# Patient Record
Sex: Male | Born: 1935
Health system: Southern US, Community
[De-identification: ages and names within clinical notes are randomized; demographics above are authoritative.]

## PROBLEM LIST (undated history)

## (undated) DIAGNOSIS — R5383 Other fatigue: Secondary | ICD-10-CM

## (undated) DIAGNOSIS — T8859XA Other complications of anesthesia, initial encounter: Secondary | ICD-10-CM

## (undated) DIAGNOSIS — E041 Nontoxic single thyroid nodule: Secondary | ICD-10-CM

## (undated) DIAGNOSIS — R238 Other skin changes: Secondary | ICD-10-CM

## (undated) DIAGNOSIS — K219 Gastro-esophageal reflux disease without esophagitis: Secondary | ICD-10-CM

## (undated) DIAGNOSIS — K759 Inflammatory liver disease, unspecified: Secondary | ICD-10-CM

## (undated) DIAGNOSIS — I10 Essential (primary) hypertension: Secondary | ICD-10-CM

## (undated) DIAGNOSIS — IMO0001 Reserved for inherently not codable concepts without codable children: Secondary | ICD-10-CM

## (undated) DIAGNOSIS — C9110 Chronic lymphocytic leukemia of B-cell type not having achieved remission: Secondary | ICD-10-CM

## (undated) DIAGNOSIS — E785 Hyperlipidemia, unspecified: Secondary | ICD-10-CM

## (undated) DIAGNOSIS — M8580 Other specified disorders of bone density and structure, unspecified site: Secondary | ICD-10-CM

## (undated) DIAGNOSIS — J189 Pneumonia, unspecified organism: Secondary | ICD-10-CM

## (undated) DIAGNOSIS — G629 Polyneuropathy, unspecified: Secondary | ICD-10-CM

## (undated) DIAGNOSIS — I714 Abdominal aortic aneurysm, without rupture, unspecified: Secondary | ICD-10-CM

## (undated) DIAGNOSIS — M519 Unspecified thoracic, thoracolumbar and lumbosacral intervertebral disc disorder: Secondary | ICD-10-CM

## (undated) DIAGNOSIS — M1990 Unspecified osteoarthritis, unspecified site: Secondary | ICD-10-CM

## (undated) DIAGNOSIS — N529 Male erectile dysfunction, unspecified: Secondary | ICD-10-CM

## (undated) DIAGNOSIS — N400 Enlarged prostate without lower urinary tract symptoms: Secondary | ICD-10-CM

## (undated) DIAGNOSIS — R3129 Other microscopic hematuria: Secondary | ICD-10-CM

## (undated) DIAGNOSIS — I255 Ischemic cardiomyopathy: Secondary | ICD-10-CM

## (undated) DIAGNOSIS — I2510 Atherosclerotic heart disease of native coronary artery without angina pectoris: Secondary | ICD-10-CM

## (undated) DIAGNOSIS — I219 Acute myocardial infarction, unspecified: Secondary | ICD-10-CM

## (undated) DIAGNOSIS — M79606 Pain in leg, unspecified: Secondary | ICD-10-CM

## (undated) DIAGNOSIS — Z9981 Dependence on supplemental oxygen: Secondary | ICD-10-CM

## (undated) DIAGNOSIS — Z923 Personal history of irradiation: Secondary | ICD-10-CM

## (undated) DIAGNOSIS — M549 Dorsalgia, unspecified: Secondary | ICD-10-CM

## (undated) DIAGNOSIS — S129XXA Fracture of neck, unspecified, initial encounter: Secondary | ICD-10-CM

## (undated) DIAGNOSIS — H9190 Unspecified hearing loss, unspecified ear: Secondary | ICD-10-CM

## (undated) DIAGNOSIS — R233 Spontaneous ecchymoses: Secondary | ICD-10-CM

## (undated) DIAGNOSIS — C3490 Malignant neoplasm of unspecified part of unspecified bronchus or lung: Secondary | ICD-10-CM

## (undated) DIAGNOSIS — J449 Chronic obstructive pulmonary disease, unspecified: Secondary | ICD-10-CM

## (undated) DIAGNOSIS — T4145XA Adverse effect of unspecified anesthetic, initial encounter: Secondary | ICD-10-CM

## (undated) HISTORY — DX: Gastro-esophageal reflux disease without esophagitis: K21.9

## (undated) HISTORY — PX: CERVICAL DISC SURGERY: SHX588

## (undated) HISTORY — DX: Dorsalgia, unspecified: M54.9

## (undated) HISTORY — DX: Hyperlipidemia, unspecified: E78.5

## (undated) HISTORY — DX: Personal history of irradiation: Z92.3

## (undated) HISTORY — DX: Pain in leg, unspecified: M79.606

## (undated) HISTORY — DX: Benign prostatic hyperplasia without lower urinary tract symptoms: N40.0

## (undated) HISTORY — DX: Abdominal aortic aneurysm, without rupture: I71.4

## (undated) HISTORY — DX: Male erectile dysfunction, unspecified: N52.9

## (undated) HISTORY — DX: Other specified disorders of bone density and structure, unspecified site: M85.80

## (undated) HISTORY — DX: Abdominal aortic aneurysm, without rupture, unspecified: I71.40

## (undated) HISTORY — DX: Other microscopic hematuria: R31.29

## (undated) HISTORY — DX: Other fatigue: R53.83

## (undated) HISTORY — DX: Chronic lymphocytic leukemia of B-cell type not having achieved remission: C91.10

## (undated) HISTORY — DX: Acute myocardial infarction, unspecified: I21.9

## (undated) HISTORY — DX: Chronic obstructive pulmonary disease, unspecified: J44.9

## (undated) HISTORY — DX: Unspecified thoracic, thoracolumbar and lumbosacral intervertebral disc disorder: M51.9

---
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
## 1979-03-16 HISTORY — PX: OTHER SURGICAL HISTORY: SHX169

## 1988-04-15 DIAGNOSIS — S129XXA Fracture of neck, unspecified, initial encounter: Secondary | ICD-10-CM

## 1988-04-15 HISTORY — DX: Fracture of neck, unspecified, initial encounter: S12.9XXA

## 1997-11-01 ENCOUNTER — Ambulatory Visit (HOSPITAL_COMMUNITY): Admission: RE | Admit: 1997-11-01 | Discharge: 1997-11-01 | Payer: Self-pay | Admitting: Neurosurgery

## 1998-08-13 ENCOUNTER — Ambulatory Visit (HOSPITAL_COMMUNITY): Admission: RE | Admit: 1998-08-13 | Discharge: 1998-08-13 | Payer: Self-pay | Admitting: Neurosurgery

## 1998-08-13 ENCOUNTER — Emergency Department (HOSPITAL_COMMUNITY): Admission: EM | Admit: 1998-08-13 | Discharge: 1998-08-13 | Payer: Self-pay | Admitting: Emergency Medicine

## 1998-08-13 ENCOUNTER — Encounter: Payer: Self-pay | Admitting: Neurosurgery

## 1998-08-19 ENCOUNTER — Encounter: Payer: Self-pay | Admitting: Neurosurgery

## 1998-08-21 ENCOUNTER — Ambulatory Visit (HOSPITAL_COMMUNITY): Admission: RE | Admit: 1998-08-21 | Discharge: 1998-08-21 | Payer: Self-pay | Admitting: Neurosurgery

## 1998-09-11 ENCOUNTER — Encounter: Payer: Self-pay | Admitting: Neurosurgery

## 1998-09-11 ENCOUNTER — Observation Stay (HOSPITAL_COMMUNITY): Admission: RE | Admit: 1998-09-11 | Discharge: 1998-09-11 | Payer: Self-pay | Admitting: Neurosurgery

## 2003-03-28 ENCOUNTER — Ambulatory Visit (HOSPITAL_COMMUNITY): Admission: RE | Admit: 2003-03-28 | Discharge: 2003-03-28 | Payer: Self-pay | Admitting: Neurosurgery

## 2003-06-10 ENCOUNTER — Inpatient Hospital Stay (HOSPITAL_COMMUNITY): Admission: RE | Admit: 2003-06-10 | Discharge: 2003-06-10 | Payer: Self-pay | Admitting: Neurosurgery

## 2003-07-05 ENCOUNTER — Encounter: Admission: RE | Admit: 2003-07-05 | Discharge: 2003-07-05 | Payer: Self-pay | Admitting: Neurosurgery

## 2003-07-14 DIAGNOSIS — J449 Chronic obstructive pulmonary disease, unspecified: Secondary | ICD-10-CM

## 2003-07-14 HISTORY — DX: Chronic obstructive pulmonary disease, unspecified: J44.9

## 2003-07-25 ENCOUNTER — Ambulatory Visit (HOSPITAL_COMMUNITY): Admission: RE | Admit: 2003-07-25 | Discharge: 2003-07-25 | Payer: Self-pay | Admitting: Internal Medicine

## 2003-09-02 ENCOUNTER — Ambulatory Visit (HOSPITAL_COMMUNITY): Admission: RE | Admit: 2003-09-02 | Discharge: 2003-09-02 | Payer: Self-pay | Admitting: Gastroenterology

## 2003-09-02 ENCOUNTER — Encounter (INDEPENDENT_AMBULATORY_CARE_PROVIDER_SITE_OTHER): Payer: Self-pay | Admitting: Specialist

## 2003-11-23 ENCOUNTER — Inpatient Hospital Stay (HOSPITAL_COMMUNITY): Admission: EM | Admit: 2003-11-23 | Discharge: 2003-11-25 | Payer: Self-pay | Admitting: Emergency Medicine

## 2003-11-23 ENCOUNTER — Encounter (INDEPENDENT_AMBULATORY_CARE_PROVIDER_SITE_OTHER): Payer: Self-pay | Admitting: *Deleted

## 2003-11-23 HISTORY — PX: APPENDECTOMY: SHX54

## 2003-12-14 HISTORY — PX: NASAL SINUS SURGERY: SHX719

## 2005-05-26 ENCOUNTER — Encounter: Admission: RE | Admit: 2005-05-26 | Discharge: 2005-05-26 | Payer: Self-pay | Admitting: Internal Medicine

## 2006-06-03 ENCOUNTER — Encounter: Admission: RE | Admit: 2006-06-03 | Discharge: 2006-06-03 | Payer: Self-pay | Admitting: Internal Medicine

## 2006-08-02 ENCOUNTER — Ambulatory Visit (HOSPITAL_COMMUNITY): Admission: RE | Admit: 2006-08-02 | Discharge: 2006-08-02 | Payer: Self-pay | Admitting: Neurosurgery

## 2006-08-25 ENCOUNTER — Inpatient Hospital Stay (HOSPITAL_COMMUNITY): Admission: RE | Admit: 2006-08-25 | Discharge: 2006-08-29 | Payer: Self-pay | Admitting: Neurosurgery

## 2006-08-31 ENCOUNTER — Inpatient Hospital Stay (HOSPITAL_COMMUNITY): Admission: EM | Admit: 2006-08-31 | Discharge: 2006-09-03 | Payer: Self-pay | Admitting: Emergency Medicine

## 2006-09-01 ENCOUNTER — Ambulatory Visit: Payer: Self-pay | Admitting: Infectious Diseases

## 2007-02-01 ENCOUNTER — Encounter: Admission: RE | Admit: 2007-02-01 | Discharge: 2007-02-01 | Payer: Self-pay | Admitting: Otolaryngology

## 2007-03-16 HISTORY — PX: BACK SURGERY: SHX140

## 2007-06-07 ENCOUNTER — Encounter: Admission: RE | Admit: 2007-06-07 | Discharge: 2007-06-07 | Payer: Self-pay | Admitting: Internal Medicine

## 2007-11-29 ENCOUNTER — Encounter: Admission: RE | Admit: 2007-11-29 | Discharge: 2007-11-29 | Payer: Self-pay | Admitting: Neurosurgery

## 2008-01-05 ENCOUNTER — Ambulatory Visit (HOSPITAL_COMMUNITY): Admission: RE | Admit: 2008-01-05 | Discharge: 2008-01-05 | Payer: Self-pay | Admitting: Neurosurgery

## 2008-01-16 ENCOUNTER — Encounter (INDEPENDENT_AMBULATORY_CARE_PROVIDER_SITE_OTHER): Payer: Self-pay | Admitting: *Deleted

## 2008-01-16 ENCOUNTER — Ambulatory Visit: Payer: Self-pay | Admitting: Surgery

## 2008-01-16 ENCOUNTER — Observation Stay (HOSPITAL_COMMUNITY): Admission: EM | Admit: 2008-01-16 | Discharge: 2008-01-17 | Payer: Self-pay | Admitting: Emergency Medicine

## 2008-06-06 ENCOUNTER — Encounter: Admission: RE | Admit: 2008-06-06 | Discharge: 2008-06-06 | Payer: Self-pay | Admitting: Internal Medicine

## 2008-06-21 ENCOUNTER — Ambulatory Visit: Payer: Self-pay | Admitting: Oncology

## 2008-06-25 ENCOUNTER — Encounter: Admission: RE | Admit: 2008-06-25 | Discharge: 2008-06-25 | Payer: Self-pay | Admitting: Gastroenterology

## 2008-06-26 ENCOUNTER — Encounter: Admission: RE | Admit: 2008-06-26 | Discharge: 2008-06-26 | Payer: Self-pay | Admitting: Neurosurgery

## 2008-07-03 ENCOUNTER — Other Ambulatory Visit: Admission: RE | Admit: 2008-07-03 | Discharge: 2008-07-03 | Payer: Self-pay | Admitting: Oncology

## 2008-07-03 ENCOUNTER — Encounter: Payer: Self-pay | Admitting: Oncology

## 2008-07-03 LAB — CHCC SMEAR

## 2008-07-03 LAB — IGG, IGA, IGM
IgA: 234 mg/dL (ref 68–378)
IgG (Immunoglobin G), Serum: 923 mg/dL (ref 694–1618)
IgM, Serum: 134 mg/dL (ref 60–263)

## 2008-07-03 LAB — CBC WITH DIFFERENTIAL/PLATELET
BASO%: 0.3 % (ref 0.0–2.0)
Basophils Absolute: 0 10*3/uL (ref 0.0–0.1)
EOS%: 1.1 % (ref 0.0–7.0)
Eosinophils Absolute: 0.1 10*3/uL (ref 0.0–0.5)
HCT: 40.1 % (ref 38.4–49.9)
HGB: 13.6 g/dL (ref 13.0–17.1)
LYMPH%: 37.9 % (ref 14.0–49.0)
MCH: 31.6 pg (ref 27.2–33.4)
MCHC: 33.8 g/dL (ref 32.0–36.0)
MCV: 93.4 fL (ref 79.3–98.0)
MONO#: 0.6 10*3/uL (ref 0.1–0.9)
MONO%: 5.5 % (ref 0.0–14.0)
NEUT#: 6.5 10*3/uL (ref 1.5–6.5)
NEUT%: 55.2 % (ref 39.0–75.0)
Platelets: 259 10*3/uL (ref 140–400)
RBC: 4.3 10*6/uL (ref 4.20–5.82)
RDW: 13.7 % (ref 11.0–14.6)
WBC: 11.8 10*3/uL — ABNORMAL HIGH (ref 4.0–10.3)
lymph#: 4.5 10*3/uL — ABNORMAL HIGH (ref 0.9–3.3)

## 2008-07-03 LAB — VITAMIN B12: Vitamin B-12: 314 pg/mL (ref 211–911)

## 2008-07-03 LAB — LACTATE DEHYDROGENASE: LDH: 187 U/L (ref 94–250)

## 2008-07-12 LAB — FLOW CYTOMETRY

## 2008-07-29 ENCOUNTER — Encounter: Admission: RE | Admit: 2008-07-29 | Discharge: 2008-07-29 | Payer: Self-pay | Admitting: Neurological Surgery

## 2008-08-15 ENCOUNTER — Inpatient Hospital Stay (HOSPITAL_COMMUNITY): Admission: RE | Admit: 2008-08-15 | Discharge: 2008-08-20 | Payer: Self-pay | Admitting: Neurosurgery

## 2008-08-15 HISTORY — PX: OTHER SURGICAL HISTORY: SHX169

## 2008-09-03 ENCOUNTER — Inpatient Hospital Stay (HOSPITAL_COMMUNITY): Admission: AD | Admit: 2008-09-03 | Discharge: 2008-09-11 | Payer: Self-pay | Admitting: Internal Medicine

## 2008-09-03 ENCOUNTER — Ambulatory Visit: Payer: Self-pay | Admitting: Pulmonary Disease

## 2008-10-09 ENCOUNTER — Encounter: Admission: RE | Admit: 2008-10-09 | Discharge: 2008-10-09 | Payer: Self-pay | Admitting: Internal Medicine

## 2008-10-13 ENCOUNTER — Encounter: Admission: RE | Admit: 2008-10-13 | Discharge: 2008-10-13 | Payer: Self-pay | Admitting: Internal Medicine

## 2008-11-27 ENCOUNTER — Encounter: Admission: RE | Admit: 2008-11-27 | Discharge: 2008-11-27 | Payer: Self-pay | Admitting: Neurosurgery

## 2008-12-27 ENCOUNTER — Ambulatory Visit: Payer: Self-pay | Admitting: Oncology

## 2008-12-31 ENCOUNTER — Ambulatory Visit: Admission: RE | Admit: 2008-12-31 | Discharge: 2008-12-31 | Payer: Self-pay | Admitting: Oncology

## 2008-12-31 ENCOUNTER — Ambulatory Visit: Payer: Self-pay | Admitting: Vascular Surgery

## 2008-12-31 ENCOUNTER — Encounter: Payer: Self-pay | Admitting: Oncology

## 2008-12-31 LAB — CBC WITH DIFFERENTIAL/PLATELET
BASO%: 0.4 % (ref 0.0–2.0)
Basophils Absolute: 0 10*3/uL (ref 0.0–0.1)
EOS%: 1.7 % (ref 0.0–7.0)
Eosinophils Absolute: 0.1 10*3/uL (ref 0.0–0.5)
HCT: 42.5 % (ref 38.4–49.9)
HGB: 14.4 g/dL (ref 13.0–17.1)
LYMPH%: 34.6 % (ref 14.0–49.0)
MCH: 30.6 pg (ref 27.2–33.4)
MCHC: 33.9 g/dL (ref 32.0–36.0)
MCV: 90.2 fL (ref 79.3–98.0)
MONO#: 0.5 10*3/uL (ref 0.1–0.9)
MONO%: 5.9 % (ref 0.0–14.0)
NEUT#: 4.7 10*3/uL (ref 1.5–6.5)
NEUT%: 57.4 % (ref 39.0–75.0)
Platelets: 161 10*3/uL (ref 140–400)
RBC: 4.71 10*6/uL (ref 4.20–5.82)
RDW: 13.2 % (ref 11.0–14.6)
WBC: 8.1 10*3/uL (ref 4.0–10.3)
lymph#: 2.8 10*3/uL (ref 0.9–3.3)

## 2009-01-24 ENCOUNTER — Encounter: Admission: RE | Admit: 2009-01-24 | Discharge: 2009-01-24 | Payer: Self-pay | Admitting: Neurosurgery

## 2009-01-30 ENCOUNTER — Encounter: Admission: RE | Admit: 2009-01-30 | Discharge: 2009-01-30 | Payer: Self-pay | Admitting: Neurosurgery

## 2009-03-12 ENCOUNTER — Inpatient Hospital Stay (HOSPITAL_COMMUNITY): Admission: AD | Admit: 2009-03-12 | Discharge: 2009-03-18 | Payer: Self-pay | Admitting: Geriatric Medicine

## 2009-03-12 ENCOUNTER — Encounter: Admission: RE | Admit: 2009-03-12 | Discharge: 2009-03-12 | Payer: Self-pay | Admitting: Internal Medicine

## 2009-03-12 ENCOUNTER — Ambulatory Visit: Payer: Self-pay | Admitting: Internal Medicine

## 2009-03-15 HISTORY — PX: OTHER SURGICAL HISTORY: SHX169

## 2009-03-17 DIAGNOSIS — I219 Acute myocardial infarction, unspecified: Secondary | ICD-10-CM

## 2009-03-17 HISTORY — DX: Acute myocardial infarction, unspecified: I21.9

## 2009-09-12 HISTORY — PX: EYE SURGERY: SHX253

## 2009-09-26 ENCOUNTER — Ambulatory Visit: Payer: Self-pay | Admitting: Oncology

## 2009-09-30 LAB — CBC WITH DIFFERENTIAL/PLATELET
BASO%: 0.4 % (ref 0.0–2.0)
Basophils Absolute: 0 10*3/uL (ref 0.0–0.1)
EOS%: 1.5 % (ref 0.0–7.0)
Eosinophils Absolute: 0.1 10*3/uL (ref 0.0–0.5)
HCT: 41 % (ref 38.4–49.9)
HGB: 14.1 g/dL (ref 13.0–17.1)
LYMPH%: 33.7 % (ref 14.0–49.0)
MCH: 31.8 pg (ref 27.2–33.4)
MCHC: 34.3 g/dL (ref 32.0–36.0)
MCV: 92.7 fL (ref 79.3–98.0)
MONO#: 0.5 10*3/uL (ref 0.1–0.9)
MONO%: 5.8 % (ref 0.0–14.0)
NEUT#: 5.2 10*3/uL (ref 1.5–6.5)
NEUT%: 58.6 % (ref 39.0–75.0)
Platelets: 193 10*3/uL (ref 140–400)
RBC: 4.43 10*6/uL (ref 4.20–5.82)
RDW: 13 % (ref 11.0–14.6)
WBC: 8.9 10*3/uL (ref 4.0–10.3)
lymph#: 3 10*3/uL (ref 0.9–3.3)

## 2009-09-30 LAB — IGG, IGA, IGM
IgA: 231 mg/dL (ref 68–378)
IgG (Immunoglobin G), Serum: 1300 mg/dL (ref 694–1618)
IgM, Serum: 180 mg/dL (ref 60–263)

## 2009-10-20 ENCOUNTER — Telehealth (INDEPENDENT_AMBULATORY_CARE_PROVIDER_SITE_OTHER): Payer: Self-pay | Admitting: *Deleted

## 2009-10-21 ENCOUNTER — Encounter: Payer: Self-pay | Admitting: Cardiology

## 2009-10-21 ENCOUNTER — Ambulatory Visit: Payer: Self-pay

## 2009-10-21 ENCOUNTER — Encounter (HOSPITAL_COMMUNITY): Admission: RE | Admit: 2009-10-21 | Discharge: 2009-12-02 | Payer: Self-pay | Admitting: Cardiology

## 2009-10-21 ENCOUNTER — Ambulatory Visit: Payer: Self-pay | Admitting: Cardiology

## 2010-02-12 HISTORY — PX: OTHER SURGICAL HISTORY: SHX169

## 2010-03-30 ENCOUNTER — Ambulatory Visit: Payer: Self-pay | Admitting: Cardiology

## 2010-04-06 ENCOUNTER — Encounter: Payer: Self-pay | Admitting: Internal Medicine

## 2010-04-14 NOTE — Progress Notes (Signed)
Summary: Nuclear Pre-Procedure  Phone Note Outgoing Call Call back at [facility] Phone [phone]   Call placed by: Stanton Kidney, EMT-P,  October 20, 2009 3:08 PM Call placed to: Patient Action Taken: Phone Call Completed Summary of Call: Reviewed information on Myoview Information Sheet (see scanned document for further details).  Spoke with the Patient.    Nuclear Med Background Indications for Stress Test: Evaluation for Ischemia, Surgical Clearance  Indications Comments: Pending possible spinal surgery- Dr. Ethelene Hal  History: Angioplasty, COPD, Heart Catheterization, Myocardial Infarction, Myocardial Perfusion Study, Stents  History Comments: 11/09 MPS: EF=50% 12/10 MI 03/17/09 Heart Cath > Angioplasty > Stents: x2 RCA  Symptoms: DOE, Fatigue    Nuclear Pre-Procedure Cardiac Risk Factors: History of Smoking

## 2010-04-14 NOTE — Assessment & Plan Note (Signed)
Summary: Cardiology Nuclear Testing  Nuclear Med Background Indications for Stress Test: Evaluation for Ischemia, Surgical Clearance, Stent Patency, PTCA Patency  Indications Comments: Pending possible spinal surgery- Dr. Ethelene Hal  History: Angioplasty, COPD, Heart Catheterization, Myocardial Infarction, Myocardial Perfusion Study, Stents  History Comments: 11/09 MPS: EF=50% 12/10 MI 03/17/09 Heart Cath > Angioplasty > Stents: x2 RCA  Symptoms: DOE, Fatigue, Fatigue with Exertion    Nuclear Pre-Procedure Cardiac Risk Factors: History of Smoking, Lipids Caffeine/Decaff Intake: 8:00PM NPO After: 8:00 PM Lungs: Clear IV 0.9% NS with Angio Cath: 22g     IV Site: (R) Hand IV Started by: Irean Hong RN Chest Size (in) 46     Height (in): 71 Weight (lb): 196 BMI: 27.44 Tech Comments: Held metoprolol 15 hrs.  Nuclear Med Study 1 or 2 day study:  1 day     Stress Test Type:  Lexiscan Low Level Reading MD:  Willa Rough, MD     Referring MD:  Delfin Edis Resting Radionuclide:  Technetium 68m Tetrofosmin     Resting Radionuclide Dose:  11.0 mCi  Stress Radionuclide:  Technetium 38m Tetrofosmin     Stress Radionuclide Dose:  32.6 mCi   Stress Protocol      Max HR:  101 bpm Max Systolic BP: 139 mm HgRate Pressure Product:  73220  Lexiscan: 0.4 mg   Stress Test Technologist:  Irean Hong RN     Nuclear Technologist:  Harlow Asa CNMT  Rest Procedure  Myocardial perfusion imaging was performed at rest 45 minutes following the intravenous administration of Myoview Technetium 59m Tetrofosmin.  Stress Procedure  The patient received IV Lexiscan 0.4 mg over 15-seconds with concurrent low level exercise, and then myoview was  injected at 30-seconds while the patient continued walking one more minute. The patient had baseline nonspecific T wave changes.  There were no significant changes with lexiscan, rare PVC.  Quantitative spect images were obtained after a 45 minute  delay.  QPS Raw Data Images:  Patient motion noted; appropriate software correction applied. Stress Images:  There is normal uptake in all areas. Rest Images:  Normal homogeneous uptake in all areas of the myocardium. Subtraction (SDS):  No evidence of ischemia. Transient Ischemic Dilatation:  .95  (Normal <1.22)  Lung/Heart Ratio:  0.32  (Normal <0.45)  Quantitative Gated Spect Images QGS EDV:  79 ml QGS ESV:  33 ml QGS EF:  58 % QGS cine images:  Mild septal dyssynergy  Findings Normal nuclear study      Overall Impression  Exercise Capacity: Lexiscan BP Response: Normal blood pressure response. Clinical Symptoms: SOB ECG Impression: No significant ST segment change suggestive of ischemia. Overall Impression: Normal stress nuclear study.

## 2010-05-31 LAB — BASIC METABOLIC PANEL
BUN: 13 mg/dL (ref 6–23)
BUN: 14 mg/dL (ref 6–23)
CO2: 26 mEq/L (ref 19–32)
CO2: 29 mEq/L (ref 19–32)
Calcium: 8.8 mg/dL (ref 8.4–10.5)
Calcium: 8.8 mg/dL (ref 8.4–10.5)
Chloride: 104 mEq/L (ref 96–112)
Chloride: 106 mEq/L (ref 96–112)
Creatinine, Ser: 0.91 mg/dL (ref 0.4–1.5)
Creatinine, Ser: 0.99 mg/dL (ref 0.4–1.5)
GFR calc Af Amer: >60 mL/min (ref >60)
GFR calc Af Amer: >60 mL/min (ref >60)
GFR calc non Af Amer: >60 mL/min (ref >60)
GFR calc non Af Amer: >60 mL/min (ref >60)
Glucose, Bld: 100 mg/dL — ABNORMAL HIGH (ref 70–99)
Glucose, Bld: 114 mg/dL — ABNORMAL HIGH (ref 70–99)
Potassium: 4.2 mEq/L (ref 3.5–5.1)
Potassium: 4.6 mEq/L (ref 3.5–5.1)
Sodium: 137 mEq/L (ref 135–145)
Sodium: 141 mEq/L (ref 135–145)

## 2010-05-31 LAB — CBC
HCT: 38.7 % — ABNORMAL LOW (ref 39.0–52.0)
HCT: 42 % (ref 39.0–52.0)
Hemoglobin: 13.5 g/dL (ref 13.0–17.0)
Hemoglobin: 14.3 g/dL (ref 13.0–17.0)
MCHC: 34.1 g/dL (ref 30.0–36.0)
MCHC: 35 g/dL (ref 30.0–36.0)
MCV: 91.1 fL (ref 78.0–100.0)
MCV: 92.5 fL (ref 78.0–100.0)
Platelets: 153 10*3/uL (ref 150–400)
Platelets: 155 10*3/uL (ref 150–400)
RBC: 4.25 MIL/uL (ref 4.22–5.81)
RBC: 4.54 MIL/uL (ref 4.22–5.81)
RDW: 13.5 % (ref 11.5–15.5)
RDW: 13.9 % (ref 11.5–15.5)
WBC: 7.8 10*3/uL (ref 4.0–10.5)
WBC: 8.1 10*3/uL (ref 4.0–10.5)

## 2010-05-31 LAB — APTT: aPTT: 43 seconds — ABNORMAL HIGH (ref 24–37)

## 2010-05-31 LAB — PROTIME-INR
INR: 1.03 (ref 0.00–1.49)
Prothrombin Time: 13.4 seconds (ref 11.6–15.2)

## 2010-06-12 ENCOUNTER — Encounter: Payer: Self-pay | Admitting: Cardiology

## 2010-06-15 LAB — BASIC METABOLIC PANEL
BUN: 18 mg/dL (ref 6–23)
CO2: 26 mEq/L (ref 19–32)
Calcium: 8.7 mg/dL (ref 8.4–10.5)
Chloride: 108 mEq/L (ref 96–112)
Creatinine, Ser: 0.93 mg/dL (ref 0.4–1.5)
GFR calc Af Amer: >60 mL/min (ref >60)
GFR calc non Af Amer: >60 mL/min (ref >60)
Glucose, Bld: 94 mg/dL (ref 70–99)
Potassium: 5.1 mEq/L (ref 3.5–5.1)
Sodium: 141 mEq/L (ref 135–145)

## 2010-06-15 LAB — COMPREHENSIVE METABOLIC PANEL
ALT: 22 U/L (ref 0–53)
AST: 26 U/L (ref 0–37)
Albumin: 3.8 g/dL (ref 3.5–5.2)
Alkaline Phosphatase: 65 U/L (ref 39–117)
BUN: 19 mg/dL (ref 6–23)
CO2: 24 mEq/L (ref 19–32)
Calcium: 9 mg/dL (ref 8.4–10.5)
Chloride: 103 mEq/L (ref 96–112)
Creatinine, Ser: 0.98 mg/dL (ref 0.4–1.5)
GFR calc Af Amer: >60 mL/min (ref >60)
GFR calc non Af Amer: >60 mL/min (ref >60)
Glucose, Bld: 150 mg/dL — ABNORMAL HIGH (ref 70–99)
Potassium: 3.3 mEq/L — ABNORMAL LOW (ref 3.5–5.1)
Sodium: 139 mEq/L (ref 135–145)
Total Bilirubin: 0.7 mg/dL (ref 0.3–1.2)
Total Protein: 6.7 g/dL (ref 6.0–8.3)

## 2010-06-15 LAB — CBC
HCT: 43.2 % (ref 39.0–52.0)
Hemoglobin: 14.9 g/dL (ref 13.0–17.0)
MCHC: 34.5 g/dL (ref 30.0–36.0)
MCV: 92.1 fL (ref 78.0–100.0)
Platelets: 227 10*3/uL (ref 150–400)
RBC: 4.69 MIL/uL (ref 4.22–5.81)
RDW: 13.8 % (ref 11.5–15.5)
WBC: 12.1 10*3/uL — ABNORMAL HIGH (ref 4.0–10.5)

## 2010-06-15 LAB — CARDIAC PANEL(CRET KIN+CKTOT+MB+TROPI)
CK, MB: 2.5 ng/mL (ref 0.3–4.0)
CK, MB: 3.4 ng/mL (ref 0.3–4.0)
CK, MB: 6.3 ng/mL — ABNORMAL HIGH (ref 0.3–4.0)
Relative Index: 3.4 — ABNORMAL HIGH (ref 0.0–2.5)
Relative Index: INVALID (ref 0.0–2.5)
Relative Index: INVALID (ref 0.0–2.5)
Total CK: 100 U/L (ref 7–232)
Total CK: 52 U/L (ref 7–232)
Total CK: 90 U/L (ref 7–232)
Troponin I: 0.29 ng/mL — ABNORMAL HIGH (ref 0.00–0.06)
Troponin I: 0.3 ng/mL — ABNORMAL HIGH (ref 0.00–0.06)
Troponin I: 0.48 ng/mL — ABNORMAL HIGH (ref 0.00–0.06)

## 2010-06-15 LAB — APTT: aPTT: 28 seconds (ref 24–37)

## 2010-06-15 LAB — PROTIME-INR
INR: 1.05 (ref 0.00–1.49)
Prothrombin Time: 13.6 seconds (ref 11.6–15.2)

## 2010-06-22 LAB — CULTURE, RESPIRATORY W GRAM STAIN: Gram Stain: NONE SEEN

## 2010-06-22 LAB — BLOOD GAS, ARTERIAL
Acid-Base Excess: 2.7 mmol/L — ABNORMAL HIGH (ref 0.0–2.0)
Acid-Base Excess: 3.7 mmol/L — ABNORMAL HIGH (ref 0.0–2.0)
Bicarbonate: 25.7 mEq/L — ABNORMAL HIGH (ref 20.0–24.0)
Bicarbonate: 27.6 mEq/L — ABNORMAL HIGH (ref 20.0–24.0)
Drawn by: 270091
Drawn by: 277331
FIO2: 0.5 %
O2 Content: 4 L/min
O2 Saturation: 91.7 %
O2 Saturation: 99.8 %
Patient temperature: 98.6
Patient temperature: 98.6
TCO2: 26.7 mmol/L (ref 0–100)
TCO2: 28.8 mmol/L (ref 0–100)
pCO2 arterial: 32.8 mmHg — ABNORMAL LOW (ref 35.0–45.0)
pCO2 arterial: 40.7 mmHg (ref 35.0–45.0)
pH, Arterial: 7.445 (ref 7.350–7.450)
pH, Arterial: 7.506 — ABNORMAL HIGH (ref 7.350–7.450)
pO2, Arterial: 164 mmHg — ABNORMAL HIGH (ref 80.0–100.0)
pO2, Arterial: 61.2 mmHg — ABNORMAL LOW (ref 80.0–100.0)

## 2010-06-22 LAB — COMPREHENSIVE METABOLIC PANEL
ALT: 20 U/L (ref 0–53)
ALT: 40 U/L (ref 0–53)
AST: 19 U/L (ref 0–37)
AST: 33 U/L (ref 0–37)
Albumin: 2.4 g/dL — ABNORMAL LOW (ref 3.5–5.2)
Albumin: 2.4 g/dL — ABNORMAL LOW (ref 3.5–5.2)
Alkaline Phosphatase: 71 U/L (ref 39–117)
Alkaline Phosphatase: 85 U/L (ref 39–117)
BUN: 12 mg/dL (ref 6–23)
BUN: 9 mg/dL (ref 6–23)
CO2: 27 mEq/L (ref 19–32)
CO2: 32 mEq/L (ref 19–32)
Calcium: 8.7 mg/dL (ref 8.4–10.5)
Calcium: 9.1 mg/dL (ref 8.4–10.5)
Chloride: 103 mEq/L (ref 96–112)
Chloride: 103 mEq/L (ref 96–112)
Creatinine, Ser: 0.74 mg/dL (ref 0.4–1.5)
Creatinine, Ser: 0.85 mg/dL (ref 0.4–1.5)
GFR calc Af Amer: >60 mL/min (ref >60)
GFR calc Af Amer: >60 mL/min (ref >60)
GFR calc non Af Amer: >60 mL/min (ref >60)
GFR calc non Af Amer: >60 mL/min (ref >60)
Glucose, Bld: 131 mg/dL — ABNORMAL HIGH (ref 70–99)
Glucose, Bld: 91 mg/dL (ref 70–99)
Potassium: 3.7 mEq/L (ref 3.5–5.1)
Potassium: 4 mEq/L (ref 3.5–5.1)
Sodium: 139 mEq/L (ref 135–145)
Sodium: 142 mEq/L (ref 135–145)
Total Bilirubin: 0.3 mg/dL (ref 0.3–1.2)
Total Bilirubin: 0.6 mg/dL (ref 0.3–1.2)
Total Protein: 6.2 g/dL (ref 6.0–8.3)
Total Protein: 6.5 g/dL (ref 6.0–8.3)

## 2010-06-22 LAB — CBC
HCT: 35.6 % — ABNORMAL LOW (ref 39.0–52.0)
HCT: 36.5 % — ABNORMAL LOW (ref 39.0–52.0)
HCT: 36.9 % — ABNORMAL LOW (ref 39.0–52.0)
HCT: 38 % — ABNORMAL LOW (ref 39.0–52.0)
HCT: 43 % (ref 39.0–52.0)
HCT: 45.2 % (ref 39.0–52.0)
Hemoglobin: 12.2 g/dL — ABNORMAL LOW (ref 13.0–17.0)
Hemoglobin: 12.2 g/dL — ABNORMAL LOW (ref 13.0–17.0)
Hemoglobin: 12.4 g/dL — ABNORMAL LOW (ref 13.0–17.0)
Hemoglobin: 12.7 g/dL — ABNORMAL LOW (ref 13.0–17.0)
Hemoglobin: 14.3 g/dL (ref 13.0–17.0)
Hemoglobin: 15.1 g/dL (ref 13.0–17.0)
MCHC: 33.3 g/dL (ref 30.0–36.0)
MCHC: 33.3 g/dL (ref 30.0–36.0)
MCHC: 33.5 g/dL (ref 30.0–36.0)
MCHC: 33.5 g/dL (ref 30.0–36.0)
MCHC: 34.2 g/dL (ref 30.0–36.0)
MCHC: 33.4 g/dL (ref 30.0–36.0)
MCV: 92.9 fL (ref 78.0–100.0)
MCV: 93.2 fL (ref 78.0–100.0)
MCV: 94 fL (ref 78.0–100.0)
MCV: 94.1 fL (ref 78.0–100.0)
MCV: 94.2 fL (ref 78.0–100.0)
MCV: 96.1 fL (ref 78.0–100.0)
Platelets: 382 10*3/uL (ref 150–400)
Platelets: 434 10*3/uL — ABNORMAL HIGH (ref 150–400)
Platelets: 434 10*3/uL — ABNORMAL HIGH (ref 150–400)
Platelets: 437 10*3/uL — ABNORMAL HIGH (ref 150–400)
Platelets: 443 10*3/uL — ABNORMAL HIGH (ref 150–400)
Platelets: 239 10*3/uL (ref 150–400)
RBC: 3.83 MIL/uL — ABNORMAL LOW (ref 4.22–5.81)
RBC: 3.89 MIL/uL — ABNORMAL LOW (ref 4.22–5.81)
RBC: 3.96 MIL/uL — ABNORMAL LOW (ref 4.22–5.81)
RBC: 4.03 MIL/uL — ABNORMAL LOW (ref 4.22–5.81)
RBC: 4.57 MIL/uL (ref 4.22–5.81)
RBC: 4.7 MIL/uL (ref 4.22–5.81)
RDW: 12.6 % (ref 11.5–15.5)
RDW: 12.7 % (ref 11.5–15.5)
RDW: 12.8 % (ref 11.5–15.5)
RDW: 12.9 % (ref 11.5–15.5)
RDW: 13.3 % (ref 11.5–15.5)
RDW: 13.6 % (ref 11.5–15.5)
WBC: 13 10*3/uL — ABNORMAL HIGH (ref 4.0–10.5)
WBC: 13.2 10*3/uL — ABNORMAL HIGH (ref 4.0–10.5)
WBC: 21.2 10*3/uL — ABNORMAL HIGH (ref 4.0–10.5)
WBC: 22.5 10*3/uL — ABNORMAL HIGH (ref 4.0–10.5)
WBC: 23.5 10*3/uL — ABNORMAL HIGH (ref 4.0–10.5)
WBC: 12.3 10*3/uL — ABNORMAL HIGH (ref 4.0–10.5)

## 2010-06-22 LAB — BASIC METABOLIC PANEL
BUN: 19 mg/dL (ref 6–23)
BUN: 6 mg/dL (ref 6–23)
BUN: 12 mg/dL (ref 6–23)
CO2: 25 mEq/L (ref 19–32)
CO2: 29 mEq/L (ref 19–32)
CO2: 30 mEq/L (ref 19–32)
Calcium: 8.8 mg/dL (ref 8.4–10.5)
Calcium: 8.9 mg/dL (ref 8.4–10.5)
Calcium: 8.9 mg/dL (ref 8.4–10.5)
Chloride: 102 mEq/L (ref 96–112)
Chloride: 98 mEq/L (ref 96–112)
Chloride: 102 mEq/L (ref 96–112)
Creatinine, Ser: 0.78 mg/dL (ref 0.4–1.5)
Creatinine, Ser: 0.83 mg/dL (ref 0.4–1.5)
Creatinine, Ser: 0.9 mg/dL (ref 0.4–1.5)
GFR calc Af Amer: >60 mL/min (ref >60)
GFR calc Af Amer: >60 mL/min (ref >60)
GFR calc Af Amer: >60 mL/min (ref >60)
GFR calc non Af Amer: >60 mL/min (ref >60)
GFR calc non Af Amer: >60 mL/min (ref >60)
GFR calc non Af Amer: >60 mL/min (ref >60)
Glucose, Bld: 190 mg/dL — ABNORMAL HIGH (ref 70–99)
Glucose, Bld: 192 mg/dL — ABNORMAL HIGH (ref 70–99)
Glucose, Bld: 124 mg/dL — ABNORMAL HIGH (ref 70–99)
Potassium: 3.8 mEq/L (ref 3.5–5.1)
Potassium: 4.9 mEq/L (ref 3.5–5.1)
Potassium: 3.7 mEq/L (ref 3.5–5.1)
Sodium: 136 mEq/L (ref 135–145)
Sodium: 139 mEq/L (ref 135–145)
Sodium: 138 mEq/L (ref 135–145)

## 2010-06-22 LAB — CULTURE, BLOOD (ROUTINE X 2)
Culture: NO GROWTH
Culture: NO GROWTH

## 2010-06-22 LAB — EXPECTORATED SPUTUM ASSESSMENT W GRAM STAIN, RFLX TO RESP C

## 2010-06-22 LAB — PROTIME-INR
INR: 1.2 (ref 0.00–1.49)
Prothrombin Time: 15.1 seconds (ref 11.6–15.2)

## 2010-06-22 LAB — VANCOMYCIN, TROUGH: Vancomycin Tr: 62 ug/mL (ref 10.0–20.0)

## 2010-06-22 LAB — TYPE AND SCREEN
ABO/RH(D): A NEG
Antibody Screen: NEGATIVE

## 2010-07-28 NOTE — H&P (Signed)
NAMEMADDEX, GARLITZ NO.:  [id number]   MEDICAL RECORD NO.:  [id number]          PATIENT TYPE:  EMS   LOCATION:  MAJO                         FACILITY:  [facility]   PHYSICIAN:  Elmore Guise., M.D.DATE OF BIRTH:  Mar 20, 1935   DATE OF ADMISSION:  01/16/2008  DATE OF DISCHARGE:                              HISTORY & PHYSICAL   INDICATION FOR ADMISSION:  Chest pain.   HISTORY OF PRESENT ILLNESS:  [name] is a very pleasant 75 year old  white male, past medical history of tobacco use and recent lower back  surgery done October 23 who presented for evaluation of right-sided  chest pain.  The patient reports normal state of health.  He had surgery  2 weeks ago.  Last week prior to the weekend he started itching on the  inside.  It was thought he was having an allergic reaction to one of  the medicines he was given around his surgery. He went to his primary  care physician and had a steroid injection and started on Zyrtec and  Atarax.  His itching totally resolved.  This morning the patient reports  that he was in normal state of health.  He denies any chest pain or  shortness of breath at that time, however, he started getting up and  around, started having some right chest pressure that radiated to his  right neck.  Question if he had any exertional worsening. He did have  mild shortness of breath associated with this.  He was given aspirin  with almost total relief of his symptoms.  By the time he arrived to the  emergency room he was chest pain free.  He has no history of prior chest  pain episodes.  He is normally very active but has been told to  increase his activity daily but take it easy.  He has had no fever,  chills, nausea, vomiting or diarrhea.   REVIEW OF SYSTEMS:  Are as per HPI.  All others are negative.   CURRENT MEDICATIONS:  Timolol 1 drop twice daily, rimantadine eye drops  1 drop each morning, Travatan eye drops 1 drop each night, Zyrtec 10  mg  daily, Atarax 25 mg every 6 hours as needed for itching.   ALLERGIES:  PENICILLIN and SULFA.   FAMILY HISTORY:  Positive for brain and lung cancer.  No history of  early heart disease noted.   SOCIAL HISTORY:  Is married, has approximately 100-pack-year history of  tobacco, quit 1 year ago.  Occasional alcohol intake.   PHYSICAL EXAMINATION:  VITAL SIGNS:  Temperature is 98.6, blood pressure  121/83.  His heart rate is 80 and regular.  His sats are 94% on room  air.  GENERAL:  In general he is a very pleasant white male, alert and  oriented x4.  No acute distress.  Skin: warm and dry.  HEENT: Normocephalic and atraumatic.  NECK:  Supple, no lymphadenopathy, 2+ carotids.  No JVD and no bruits.  LUNGS:  Lungs are clear.  No wheezes noted.  CARDIOVASCULAR:  Heart is regular with normal S1,  S2.  ABDOMEN:  Abdomen is soft, nontender, nondistended rebound or guarding.  EXTREMITIES:  Warm with 2+ pulses and no edema.   LABORATORY DATA:  His chest x-ray shows no acute cardiopulmonary  disease.   His ECG shows normal sinus rhythm 84 per minute, normal axis, normal  intervals, no ST or T-wave changes.   His blood work shows a myoglobin of 73 and 47.  Troponin I less than  0.05 x2 and CPK-MB of 1.2 and 1.4.  His BNP is less than 30.  His white  count is 17.8 with a hemoglobin of 15.2 and platelet count of 301.  His  BUN and creatinine are 17 and 0.9 with potassium of 3.9.  His LFTs are  normal.  His PT/INR are 13.8 and 1.0 with a PTT of 30.   IMPRESSION:  1. Chest pain.  2. Recent back surgery.   PLAN:  The patient will be admitted for 24-hour observation.  He will  have serial cardiac markers done as well as checking fasting lipids.  He  will have repeat ECG in the morning.  A D-dimer will likely give  little  yield with his recent steroid injection and surgery; therefore, we will  send him for lower extremity duplex to rule out deep venous thrombosis.  If this is negative and  his enzymes are negative I feel he is safe for  discharge in the morning with likely outpatient stress test if needed.      Elmore Guise., M.D.  Electronically Signed     TWK/MEDQ  D:  01/16/2008  T:  01/16/2008  Job:  [id number]   cc:   Georgann Housekeeper, MD

## 2010-07-28 NOTE — H&P (Signed)
NAMEALISHA, BURGO NO.:  [id number]   MEDICAL RECORD NO.:  [id number]          PATIENT TYPE:  INP   LOCATION:  1824                         FACILITY:  [facility]   PHYSICIAN:  Hollice Espy, M.D.DATE OF BIRTH:  [birth year]   DATE OF ADMISSION:  08/30/2006  DATE OF DISCHARGE:                              HISTORY & PHYSICAL   NEUROSURGEON:  Danae Orleans. Venetia Maxon, M.D.   CHIEF COMPLAINT:  Fever.   HISTORY OF PRESENT ILLNESS:  The patient is a 75 year old white male  with past medical history of anxiety who was just discharged two days  ago after having a status post C3 to C6 posterior decompression  secondary to cervical stenosis and has a previous history of C3 to C5  cervical spondylosis which lead to fusion.  Postoperatively he had a  Foley in for three days even at home.  He says his neck pain and  swelling has come down but he has noted ever since the Foley catheter  came out, he has had dysuria.  These symptoms persisted and finally  started having fevers over the last 24 hours.  His wife documented  temperatures of 101 to 102, so she brought him to the emergency room for  further evaluation.  In the emergency room, the patient was noted to  have a white count of 18.2, with a 79% shift.  The rest of the patient's  labs were noted for a glucose of 148, BUN 15, creatinine 1.14.  Moderate  leukocyte esterase with 1120 white cells and many bacteria.  The patient  was given a dose of IV Cipro and blood cultures were drawn prior to  Cipro being given as was a urine culture.   REVIEW OF SYSTEMS:  Currently the patient is doing okay.  He denies any  headaches, vision changes, dysphagia.  He admits to some chronic neck  soreness, status post surgery, but this seems to be improving.  He  denies any chest pain, palpitations, shortness of breath, wheezing,  coughing, abdominal pain, hematuria.  He does note some dysuria.  No  constipation, diarrhea.  No focal extremity  numbness, weakness or pain.  Review of systems otherwise negative.   PAST MEDICAL HISTORY:  1. History of lumbar laminectomy.  2. Knee arthroscopy.  3. Sinus surgery.  4. Halo traction for his C-spine fracture.  5. Anxiety.  6. Glaucoma.   MEDICATIONS:  1. Percocet one to two tablets p.o. q.4 h. P.r.n.  2. Valium 5 mg p.o. q.6h. p.r.n.  3. Lyrica 75 mg p.o. b.i.d.  4. Xalatan 0.05% one drop each eye b.i.d.   ALLERGIES:  PENICILLIN.   SOCIAL HISTORY:  He denies any alcohol or drug use.  He does have a long  tobacco history.  Just quit one month ago.   FAMILY HISTORY:  Noncontributory.   PHYSICAL EXAMINATION:  VITAL SIGNS:  On admission, temperature 101.3,  heart rate 146, now down to 109.  Blood pressure 109/66. Respirations  28. Oxygen saturation 98% on 2 L.  The patient's chest x-ray shows  bibasilar atelectasis.  GENERAL:  The  patient is alert and oriented x3, in no apparent distress.  HEENT:  Normocephalic, atraumatic.  Mucous membranes are slightly dry.  He has no carotid bruits.  HEART:  Regular rate and rhythm.  S1 and S2.  LUNGS:  Clear to auscultation bilaterally.  ABDOMEN:  Soft, nontender, slightly distended.  Decreased bowel sounds.  EXTREMITIES:  No clubbing, cyanosis or edema.  NEUROLOGIC:  He has a soft C-collar on.   LABORATORY DATA:  White count 18.2, hemoglobin 13.3, hematocrit 38.6,  MCV 92, platelet count 205,000, 79% neutrophils.  Sodium 135, potassium  4.7, chloride 97, bicarb 31, BUN 15, creatinine 1.1, glucose 148.  LFTs  are noted for an albumin of 2.8.  Urinalysis shows some moderate  leukocyte esterase with large hemoglobin, 100 mg of protein, 11-20 white  and red cells with many bacteria.   ASSESSMENT/PLAN:  1. Urinary tract infection.  This may be a simple urinary tract      infection secondary to Foley catheter placement after the patient's      cervical surgery.  However, more concerning would be a diskitis      that is seeding his urine.  So we will put him on IV Cipro.  Blood      cultures have already be drawn in the emergency room, but will also      check an MRI of the C-spine to rule out diskitis.  If this is      indeed, the case, we will then contact Dr. Venetia Maxon, his neurosurgeon      for further assistance.  2. Glaucoma.  Continue Xalatan.  3. Protein/calorie malnutrition.  Now the patient is starting to take      p.o.  Hopefully, his appetite will improve.  4. Anxiety.  Continue Valium.      Hollice Espy, M.D.  Electronically Signed     SKK/MEDQ  D:  08/31/2006  T:  08/31/2006  Job:  [id number]   cc:   Georgann Housekeeper, MD  Danae Orleans. Venetia Maxon, M.D.

## 2010-07-28 NOTE — Op Note (Signed)
[name], [name]                 ACCOUNT NO.:  [id number]   MEDICAL RECORD NO.:  [id number]          PATIENT TYPE:  AMB   LOCATION:  SDS                          FACILITY:  [facility]   PHYSICIAN:  Reinaldo Meeker, M.D. DATE OF BIRTH:  [birth year]   DATE OF PROCEDURE:  01/05/2008  DATE OF DISCHARGE:                               OPERATIVE REPORT   PREOPERATIVE DIAGNOSIS:  Herniated disk, L2-L3 right.   POSTOPERATIVE DIAGNOSIS:  Herniated disk, L2-L3 right.   PROCEDURE:  Right L2-L3 interlaminar laminotomy for excision of  herniated disk with the operative microscope.   SECONDARY PROCEDURE:  Microdissection, L2-L3 disk and L3 nerve root.   SURGEON:  Reinaldo Meeker, MD   ASSISTANT:  Donalee Citrin, MD   PROCEDURE IN DETAIL:  After being placed in the prone position, the  patient's back was prepped and draped in the usual sterile fashion.  Localizing x-ray was taken prior to incision to identify the appropriate  level.  A midline incision was made above the spinous processes of L2  and L3.  Using Bovie cutting current, the incision was carried up the  spinous processes.  Subperiosteal dissection was then carried out along  the right-sided spinous processes and lamina and self-retaining  retractor was placed for exposure.  X-rays showed approach at the  appropriate level.  Using the high-speed drill, the inferior one-third  of the L2 lamina, medial one-third of the facet joint, and the superior  one-half of the L3 lamina were removed.  Residual bone and ligamentum  flavum were removed in a piecemeal fashion.  The microscope was draped,  brought in the field, and used for the remainder of the case.  Using  microdissection technique, the lateral aspect of thecal sac and L3 nerve  root were identified.  Further coagulation was carried up along the  floor of the canal to identify the L2-L3 disk which found be herniated  beneath the nerve root.  After coagulating the annulus, the annulus  was  incised with a #15 blade.  Using pituitary rongeurs and curettes,  thorough disk space clean-out was carried out and at same time great  care was taken to avoid injury to the neural elements.  This was  successfully done.  At this point, inspection was carried out at all  directions for any evidence of residual compression and none could be  identified.  Large amounts of irrigation was carried out.  Any bleeding  controlled with bipolar coagulation and Gelfoam.  The wound was then  closed in multiple layers of Vicryl in the muscle fascia, subcutaneous  and subcuticular tissues.  Staples were placed on the skin.  A sterile  dressing was then applied and the patient was extubated and taken to  recovery room in stable condition.           ______________________________  Reinaldo Meeker, M.D.     ROK/MEDQ  D:  01/05/2008  T:  01/06/2008  Job:  [id number]

## 2010-07-28 NOTE — Discharge Summary (Signed)
NAMEKYNGSTON, PICKELSIMER NO.:  [id number]   MEDICAL RECORD NO.:  [id number]          PATIENT TYPE:  OBV   LOCATION:  3737                         FACILITY:  [facility]   PHYSICIAN:  Elmore Guise., M.D.DATE OF BIRTH:  1935/04/05   DATE OF ADMISSION:  01/16/2008  DATE OF DISCHARGE:  01/17/2008                               DISCHARGE SUMMARY   DISCHARGE DIAGNOSES:  1. Chest pain.  2. Recent back surgery.  3. Allergic reaction to either his ANESTHETIC MEDICINES or possible      ANTIBIOTICS during around his surgery.   HISTORY OF PRESENT ILLNESS:  Mr. Teo is a very pleasant 75 year old  white male who presented with neck and chest pain.  He was admitted  overnight for observation.   HOSPITAL COURSE:  The patient's hospital course was uncomplicated.  His  EKG remained normal.  His enzymes were all negative.  After long  discussion, he reports that he had similar episode approximately 2 years  ago after his first surgery and he thinks it is something related to  either his anesthetic medicines or possibly to antibiotic usage.  He has  had no further chest pain.  He still has right-sided neck pain that he  takes nonsteroidals for with some improvement.  He has been up and  active with no significant problems.  He will be discharged home today  to continue the following medications.  1. Zyrtec 10 mg daily.  2. Atenolol eye drops 1 drop 1 time daily.  3. Alphagan eye drops 1 drop 2 times daily.  4. Travatan eye drops 1 drop 1 time daily.  5. Atarax 25 mg every 6 hours as needed for itching.  6. Ibuprofen 200 mg, he takes 2-3 every 8 hours as needed for pain.   For followup, he does need to keep his regular appointment with his  surgeon; otherwise, he will be set up for an outpatient stress test  after he is released from his surgical restrictions.  We will call him  to set this up.  If he has any further problems, he is to give me a  call.      Elmore Guise., M.D.  Electronically Signed     TWK/MEDQ  D:  01/17/2008  T:  01/18/2008  Job:  [id number]   cc:   Georgann Housekeeper, MD

## 2010-07-28 NOTE — Op Note (Signed)
NAMEKIEFFER, BLATZ NO.:  [id number]   MEDICAL RECORD NO.:  [id number]          PATIENT TYPE:  INP   LOCATION:  3033                         FACILITY:  [facility]   PHYSICIAN:  Reinaldo Meeker, M.D. DATE OF BIRTH:  1935-08-02   DATE OF PROCEDURE:  08/15/2008  DATE OF DISCHARGE:                               OPERATIVE REPORT   PREOPERATIVE DIAGNOSIS:  Spondylosis with complete disk space collapse  and spondylolisthesis at L2-3 and L3-4 with secondary stenosis.   POSTOPERATIVE DIAGNOSIS:  Spondylosis with complete disk space collapse  and spondylolisthesis at L2-3 and L3-4 with secondary stenosis.   PROCEDURE:  L2-3 and L3-4 extreme lateral interbody fusion with PEEK  interbody spacer followed by L2-3 and L3-4 posterior instrumentation  with a fixed plate.   SURGEON:  Reinaldo Meeker, MD   ASSISTANT:  Tia Alert, MD   PROCEDURE IN DETAIL:  After placing the lateral position right side up,  localizing fluoroscopy was used to align the spine and identify the  appropriate levels.  Incision area was then prepped and draped.  A  vertical incision was made in the right lateral region in line with the  medial aspect of the L2, L3, and L4 vertebral bodies.  Another incision  was made posterior to this.  The posterior incision was then carried  down and with finger dissection the 2 wings fashioned to the  retroperitoneum were easily passed.  The femur was then turned upward  towards the more anterior incision and dissection through the soft  tissue was carried out to allow access directly towards our target into  the retroperitoneum.  We started the L3-4.  A series of dilators were  passed through the psoas muscle which was tested for electrical  activity.  No significant activity could be identified.  Dilation was  then carried out and then the Maxis retractor was placed and secured in  a standard fashion.  It was opened in both directions.  After testing to  confirm good electrical testing, the shim was placed to secure the  retractor in the disk space.  Testing was carried out once more and  position was confirmed with AP and lateral fluoroscopy and found to be  in good position.  The disk was then incised with a 15 blade and  thoroughly cleaned out with a variety of scraping and cutting  instruments and pituitary rongeurs.  It was then sequentially dilated up  to an 8 mm size, which was felt to be a good fit for this level.  An 8 x  55 mm cage was chosen for L3-4 and impacted without difficulty and  fluoroscopy showed it to be in excellent position.  The self-retaining  retractor was placed for exposure.  X-ray showed excellent placement of  the cage.  Attention was then turned to L2-3 where a similar procedure  was carried out.  We had a new pass into the psoas muscle, though we did  use the same entry into the retroperitoneal space.  We were able to  sequentially dilate with no significant electrical activity  and no  concern for nerve impingement in the psoas muscle.  We dilated it up and  placed the retractors in a standard fashion and tested once again and  found no evidence of electrical stimulation.  The retractor was then  opened slightly in all directions.  Testing was carried out with the  ball probe for EMG testing and no significant electrical activity was  identified.  The patient was then placed to secure the retractor into  the disk space.  Once again, a 15 blade was used to incise the disk and  the disk space was thoroughly cleaned out with a variety of scraping  instruments and curettes.  At this level, dilatation was then carried  out to a 10 mm size, which was then chosen as a good size for the graft.  This was then filled with a mixture of Osteocel Plus as it had been at  the L3-4 level.  Once again, the cage was impacted without difficulty  and fluoroscopy showed it to be in excellent position.  Once again, self-   retaining retractor was removed and final fluoroscopy showed excellent  placement of the spacers at both levels and good reestablishment of disk  space height.  The wound was then irrigated and closed with a few layers  of interrupted Vicryl and Dermabond was placed on the skin.  The patient  was then turned into prone position.  The area was prepped and draped  once again.  Previous lumbar incision was opened up and carried down the  tips of the spinous processes.  Subperiosteal dissection was then  carried out bilaterally on the spinous processes and lamina.  Self-  retaining retractor was placed for exposure.  Interspinous ligament was  removed.  The space was made for 2 stackable 35-mm fixed plates.  These  were then trialed and found to be in good position and then they were  secured in a standard fashion.  Final fluoroscopy in AP and lateral  direction showed excellent placement of the PEEK spacer as well as the  spinous process plates.  The wound was then irrigated copiously.  Interspinous fusion was carried out with some residual Osteocel Plus.  The wound was then closed in multiple layers of Vicryl in the muscle  fascia, subcutaneous and subcuticular tissues and staples were placed on  the skin.  Sterile dressing was then applied.  The patient was extubated  and taken to the recovery room in stable condition.           ______________________________  Reinaldo Meeker, M.D.     ROK/MEDQ  D:  08/15/2008  T:  08/16/2008  Job:  [id number]

## 2010-07-28 NOTE — Discharge Summary (Signed)
NAMECORTEZ, FLIPPEN                 ACCOUNT NO.:  [id number]   MEDICAL RECORD NO.:  [id number]          PATIENT TYPE:  INP   LOCATION:  5526                         FACILITY:  [facility]   PHYSICIAN:  Georgann Housekeeper, MD      DATE OF BIRTH:  1935/12/28   DATE OF ADMISSION:  09/03/2008  DATE OF DISCHARGE:  09/11/2008                               DISCHARGE SUMMARY   DISCHARGE DIAGNOSES:  1. Multilobar pneumonia involving right upper, middle, and lower lobe      and left upper lobe.  2. History of chronic obstructive pulmonary disease.  3. Hypoxia.  4. Elevated white count.  5. Constipation.  6. Recent back surgery, lumbar laminectomy.  7. History of benign prostatic hypertrophy.  8. History of glaucoma.   MEDICATION ON DISCHARGE:  1. Flomax 0.4 mg daily.  2. Senokot 2 tablets at night.  3. Atarax 10 mg q.6 h. p.r.n. for itching.  4. Eye drops for glaucoma as home use.  5. Atrovent and albuterol nebulizers 1.25 mg/0.5 mg q.4-6 h. p.r.n.  6. Oxygen 2-3 L nasal cannula.  7. Prednisone taper 20 mg 3 tablets for 2 days, 2 tablets for 3 days,      and 1 tablet for 3 days.  8. Mucinex 600 mg twice a day for 10 days.  9. Levaquin 750 mg 1 a day for 7 more days.  10.Vicodin 1 q.6 h. p.r.n. for pain.  11.Tylenol 500 q.4-6 h. p.r.n.   CONSULTATION:  1. Pulmonary.  2. Physical therapy.   The chest x-ray with the multilobar pneumonia, last x-ray on June 29  showed improvement of the pneumonia.  Blood work; white count of 23,000  secondary to steroids.  Blood chemistries; potassium 3.8, creatinine  0.8, and sodium 136.  Blood cultures and sputum culture remained  negative.   HOSPITAL COURSE:  A 75 year old male with recent back surgery was seen  outpatient for bronchitis and worsening shortness of breath, found to  not getting better with Z-Pak, found to have multilobar pneumonia and  hypoxia admitted.   1. Pulmonary:  The patient was started on IV antibiotic and Primaxin.      He  continues to have some shortness of breath and wheezing.  He      continued on nebulizer oxygen and started on steroids with      improvement.  He was also started on IV vancomycin.  Pulmonary was      consulted.  The patient had a CT scan of the chest showed      pneumonias without any evidence of any mass.  He continues to      improve with oxygenation as well as his chest x-ray improved.  The      patient will continue on p.o. antibiotic Levaquin.  He finished the      course of Primaxin for total of 7 days as well as vancomycin for      total of 5 days.  The patient's prednisone will be tapered.      Continue on nebulizers and he will require some oxygen at home  and      had some hypoxia with ambulation with sats in the 85-88% on room      air with walking.  His sats at rest in 90-92%.  2. As far as his back, his back well healed, no much pain.  Continue      on physical therapy and follow with Neurosurgery, Dr. Gerlene Fee.  3. Constipation.  Continue on Senokot, doing well.  He will require      some oxygen for little while at home.  I will see him back in 1      week.  We will repeat his white count again.      Georgann Housekeeper, MD  Electronically Signed     KH/MEDQ  D:  09/11/2008  T:  09/11/2008  Job:  [id number]

## 2010-07-28 NOTE — Op Note (Signed)
[name], [name]                 ACCOUNT NO.:  [id number]   MEDICAL RECORD NO.:  [id number]          PATIENT TYPE:  INP   LOCATION:  2550                         FACILITY:  [facility]   PHYSICIAN:  Danae Orleans. Venetia Maxon, M.D.  DATE OF BIRTH:  [birth year]   DATE OF PROCEDURE:  08/25/2006  DATE OF DISCHARGE:                               OPERATIVE REPORT   PREOPERATIVE DIAGNOSIS:  Cervical stenosis, pseudoarthrosis, previous  fusion C3 through C5 level with cervical spondylosis, cervical  myelopathy and cervical radiculopathy, C3 through C6 level.   POSTOPERATIVE DIAGNOSIS:  Cervical stenosis, pseudoarthrosis, previous  fusion C3 through C5 level with cervical spondylosis, cervical  myelopathy and cervical radiculopathy, C3 through C6 level.   PROCEDURE:  C3 to C6 posterior decompression and segmental  instrumentation, C3 through C6 with arthrodesis C3-C7 using bone  morphogenic protein, autograft, allograft, and bone graft extender.   SURGEON:  Danae Orleans. Venetia Maxon, M.D.   ASSISTANT:  Cristi Loron, M.D.  Georgiann Cocker, RN   ANESTHESIA:  General endotracheal anesthesia.   BLOOD LOSS:  300 mL.   COMPLICATIONS:  None.   DISPOSITION:  Recovery.   INDICATIONS:  [name] is a 75 year old man with cervical spondylitic  myelopathy and cervical stenosis with cervical spondylosis.  He has had  prior anterior cervical decompression and fusion by another surgeon from  C3 to C5 levels and has clear evidence of pseudoarthrosis at these  levels.  He also has developed spondylitic stenosis and cord compression  at C5-C6.  It was elected to take him to surgery for posterior  decompression and fusion C3 through C6 levels.   DESCRIPTION OF PROCEDURE:  [name] was brought to the operating room.  Following satisfactory uncomplicated induction of general endotracheal  anesthesia and placement of intravenous lines and a Foley catheter, he  was placed in a prone position on the operating table in  three pin head  fixation.  The neck was maintained in neutral alignment.  He was turned  in the collar. Subsequently, his posterior neck was then prepped and  draped in the usual sterile fashion.  The area of planned incision was  infiltrated with 0.25% Marcaine and 0.5% lidocaine with 1:100,000  epinephrine.   An incision was made in the midline and carried through copious  subcutaneous tissues to the posterior cervical spine. Using  electrocautery and subperiosteal dissection, the C3 through C7 lateral  masses were identified and exposed.  Subsequently, the self-retaining  retractor was placed to facilitate exposure.  Using intraoperative  fluoroscopy, 12 mm x 3.5 mm lateral mass screws were placed at C3  through C6 bilaterally and a rescue screws placed at C6 on the left, but  all other screws had excellent purchase, the bone was fairly soft.  50  mm rods were then lordosed and affixed to the screw heads and  subsequently total laminectomy of C3, C4, C5, and the upper aspect of C6  were performed with resultant decompression of spinal cord dura.  A  lateral recess was also decompressed. This was done under loupe  magnification with 2 and 3 mm  gold tip Kerrison rongeur.  Subsequently,  the lateral masses of C7, C6, C5, C4, and C3 were decorticated and then  packed with morselized bone autograft and small BMP kit and 10 mL of  tricalcium phosphate bone graft extender.   A Blake drain was placed.  Hemostasis was assured.  The self-retaining  retractor was removed.  The posterior cervical fascia was reapproximated  with 0 Vicryl sutures, subcutaneous tissues were approximated 2-0 Vicryl  interrupted inverted sutures, and skin edges were approximated 3-0  Vicryl interrupted inverted sutures.  The skin was closed with running 3-  0 nylon locked stitch.  The wound was dressed with bacitracin, Telfa  gauze, and tape.  The patient was extubated in the operating room and  taken to the  recovery room in stable satisfactory condition having  tolerated the operation well.  Counts were correct at the end of the  case.      Danae Orleans. Venetia Maxon, M.D.  Electronically Signed     JDS/MEDQ  D:  08/25/2006  T:  08/25/2006  Job:  [id number]

## 2010-07-31 NOTE — Op Note (Signed)
NAME:  [name], [name]                           ACCOUNT NO.:  [id number]   MEDICAL RECORD NO.:  [id number]                   PATIENT TYPE:  AMB   LOCATION:  ENDO                                 FACILITY:  [facility]   PHYSICIAN:  Danise Edge, M.D.                DATE OF BIRTH:  [birth year]   DATE OF PROCEDURE:  09/02/2003  DATE OF DISCHARGE:                                 OPERATIVE REPORT   PROCEDURE:  Screening colonoscopy.   REFERRED BY:  Georgann Housekeeper, M.D.   INDICATIONS FOR PROCEDURE:  [name] is a 75 year old male born  Aug 19, 1935.  [name] is scheduled to undergo his first screening  colonoscopy with polypectomy to prevent colon cancer.   ENDOSCOPIST:  Danise Edge, M.D.   PREMEDICATION:  Versed 7.5 mg, Demerol 70 mg.   DESCRIPTION OF PROCEDURE:  After obtaining informed consent, [name] was  placed in the left lateral decubitus position. I administered intravenous  Demerol and intravenous Versed to achieve conscious sedation for the  procedure. The patient's blood pressure, oxygen saturation and cardiac  rhythm were monitored throughout the procedure and documented in the medical  record.   Anal inspection and digital rectal exam were normal. The Olympus adjustable  pediatric colonoscope was introduced into the rectum and advanced to the  cecum. Colonic preparation for the exam today was excellent.   RECTUM:  From the distal rectum, a 1 mm sessile polyp was removed with the  cold biopsy forceps.   SIGMOID COLON AND DESCENDING COLON:  Normal.   SPLENIC FLEXURE:  Normal.   TRANSVERSE COLON:  Normal.   HEPATIC FLEXURE:  Normal.   ASCENDING COLON:  Normal.   CECUM AND ILEOCECAL VALVE:  From the proximal cecum, a 1 mm sessile polyp  was removed with the cold biopsy forceps.   ASSESSMENT:  A diminutive polyp was removed from the proximal cecum and a  diminutive polyp was removed from the distal rectum. Both polyps were  submitted in one bottle  for pathological evaluation.   RECOMMENDATIONS:  Repeat colonoscopy in five years if polyps return  neoplastic.                                               Danise Edge, M.D.    MJ/MEDQ  D:  09/02/2003  T:  09/02/2003  Job:  [id number]   cc:   Georgann Housekeeper, M.D.  [address]  [place]  [place] [address]  Fax: [phone]

## 2010-07-31 NOTE — Op Note (Signed)
NAME:  [name], [name]                           ACCOUNT NO.:  [id number]   MEDICAL RECORD NO.:  [id number]                   PATIENT TYPE:  INP   LOCATION:  5714                                 FACILITY:  [facility]   PHYSICIAN:  Sharlet Salina T. Hoxworth, M.D.          DATE OF BIRTH:  [birth year]   DATE OF PROCEDURE:  11/23/2003  DATE OF DISCHARGE:                                 OPERATIVE REPORT   PREOPERATIVE DIAGNOSES:  Acute appendicitis.   POSTOPERATIVE DIAGNOSIS:  Acute appendicitis.   SURGICAL PROCEDURE:  Laparoscopic appendectomy.   SURGEON:  Lorne Skeens. Hoxworth, M.D.   ANESTHESIA:  General.   BRIEF HISTORY:  Mr. Youman is a 75 year old male who presents with typical  findings of acute appendicitis, with 24-hr history of right lower quadrant  pain and acute appendicitis confirmed on CT scan.  Laparoscopic and possible  open appendectomy have been recommended and accepted.   The nature of the procedure, indications, risks of bleeding and infection  were discussed with the patient and his wife preoperatively.  He was given  broad-spectrum preoperative antibiotics.  He is now brought to the operating  room for the procedure.   DESCRIPTION OF PROCEDURE:  The patient was brought to the operating room and  placed in supine position on the operating room table.  General endotracheal  anesthesia was induced.  A Foley catheter was placed.  The abdomen was  widely sterilely prepped and draped.  Local anesthesia was used to  infiltrate the trocar sites prior to the incisions.  A 1 cm incision was  made at the umbilicus and dissection carried down to the midline fascia,  which was sharply incised for 1 cm.  The peritoneum then entered under  direct vision.  Through a mattress suture of 0 Vicryl, the Hasson trocar was  inserted and pneumoperitoneum established.  Under direct vision a 5 mm  trocar was placed in the right upper quadrant, and a 12 mm trocar in the  left lower quadrant.   The  appendix was found lying medial to the cecum, and was acutely inflamed  with exudate, but no gangrene or perforation.  It was freed from  inflammatory attachments.  Lateral peritoneal attachments were divided with  the harmonic scalpel, and the mesoappendix and base of the appendix  completely exposed.  The mesoappendix was then sequentially divided with the  harmonic scalpel, until the appendix was completely freed down to its base,  which was relatively free of inflammation.  The appendix was then divided  across the space with a single firing of the Endo GIA blue load, 45 mm  stapler.  The appendix was placed in an Endo catch bag and brought out  through the umbilicus.  The right lower quadrant was then thoroughly  irrigated and mesoappendix and staple line inspected.  There was no evidence  of bleeding.  Trocars were removed and all CO2 was evacuated.  The mattress  suture was  secured at the umbilicus.  Skin incisions were closed with interrupted  subcuticular 4-0 Monocryl and Steri-Strips.  Sponge, needle and instrument  counts were correct.  Sterile dressings were applied.  The patient was taken  to the recovery room in satisfactory condition.                                               Lorne Skeens. Hoxworth, M.D.    Tory Emerald  D:  11/23/2003  T:  11/23/2003  Job:  [id number]

## 2010-07-31 NOTE — Discharge Summary (Signed)
[name], [name]                 ACCOUNT NO.:  [id number]   MEDICAL RECORD NO.:  [id number]          PATIENT TYPE:  INP   LOCATION:  6743                         FACILITY:  [facility]   PHYSICIAN:  Georgann Housekeeper, MD      DATE OF BIRTH:  [birth year]   DATE OF ADMISSION:  08/30/2006  DATE OF DISCHARGE:  09/03/2006                               DISCHARGE SUMMARY   ADMITTING DIAGNOSES:  1. Fever, rule out diskitis.  2. Urinary tract infection.   DISCHARGE DIAGNOSES:  1. Escherichia coli urinary tract infection.  2. Status post C3 to C6 posterior decompression from cervical stenosis      on previous hospitalization.  3. Anxiety.  4. Glaucoma.   CONSULTANTS:  1. Dr. Johny Sax, Infectious Diseases.  2. Dr. Maeola Harman, Neurosurgery.   BRIEF HISTORY:  [name] is a pleasant 75 year old gentleman who had  been just discharged 2 days prior to the above-mentioned cervical  surgery.  He had a Foley catheter in recently with his surgery.  He has  had urinary discomfort since removal of his catheter and had documented  fevers at home of 101-102.  He was brought in to the ER for further  evaluation.'   PHYSICAL EXAM:  Temperature 101.3, pulse 109, blood pressure 109/66.  HEENT:  Oral mucosa is dry.  HEART:  Regular rate and rhythm without murmur.  LUNGS:  Clear.  ABDOMEN:  Soft, nontender.  C-collar was in place.   LABORATORY DATA:  White count 8200, hemoglobin 13.3, platelet count  205,000.  Sodium 135, potassium 4.7, chloride 97, bicarbonate 31, BUN  15, creatinine 1.1, glucose 148.  Urinalysis revealed moderate leukocyte  esterase, 11-20 red and white cells, many bacteria.   HOSPITAL COURSE:  It was felt the patient most likely had a UTI and was  started on ciprofloxacin.  However, with his recent neck surgery  possibility of surgical wound infection or diskitis was raised.  He  underwent an MRI of the cervical spine with contrast.  Postop  laminectomy and posterior hardware  fusion was noted at C3, C4, C5 and  C6.  There was felt to possibly be a seroma.  He was seen in  consultation by Neurosurgery.  They felt that his postop wound looked  fine and that all changes were consistent with what they would expect at  this point after surgery.  He was seen in consultation by Dr. Enedina Finner, of Infectious Disease.  He suggested adding vancomycin.  He was  improving on therapy on the second day of hospitalization and his urine  grew out E. coli.  The E. coli was found to be resistant to the Cipro  and he was switched to Bactrim.  He remained afebrile 24 hours prior to  discharge and it is felt he would need Bactrim for a total of 2 weeks.  On September 03, 2006, he was discharged home.   MEDICATIONS AT DISCHARGE:  1. Lyrica 75 mg a day.  2. Diazepam 5 mg p.r.n. for muscle spasm.  3. Oxycodone 5/325 q.4h. p.r.n. pain.  4. Xalatan 1  drop both eyes daily.  5. Bactrim DS 1 p.o. b.i.d. for 3 weeks.   He is to call for an appointment with Dr. Donette Larry in 2 weeks.     ______________________________  [name], M.D.      Georgann Housekeeper, MD  Electronically Signed    HTS/MEDQ  D:  09/04/2006  T:  09/04/2006  Job:  [id number]   cc:   Danae Orleans. Venetia Maxon, M.D.  Georgann Housekeeper, MD  Maximino Greenland, Dr.

## 2010-07-31 NOTE — H&P (Signed)
NAME:  [name], [name]                           ACCOUNT NO.:  [id number]   MEDICAL RECORD NO.:  [id number]                   PATIENT TYPE:  INP   LOCATION:  1825                                 FACILITY:  [facility]   PHYSICIAN:  Sharlet Salina T. Hoxworth, M.D.          DATE OF BIRTH:  [birth year]   DATE OF ADMISSION:  11/23/2003  DATE OF DISCHARGE:                                HISTORY & PHYSICAL   CHIEF COMPLAINT:  Right lower quadrant pain.   HISTORY OF PRESENT ILLNESS:  [name] is a 75 year old male who presented to  the [facility] Emergency Room with 24 hours of constant, progressively  worsening, right lower quadrant abdominal pain.  The pain has been in this  location since its start.  He describes a constant ache and is worse with  any motion.  He has not had any nausea or vomiting, no fevers or chills.  Bowel movements have been normal.  No urinary symptoms.  He has no history  of any previous similar symptoms or chronic GI complaints.   PAST MEDICAL HISTORY:  He is followed by Georgann Housekeeper, M.D. without any  serious medical problems.   PAST SURGICAL HISTORY:  1.  Halo traction for a C-spine fracture.  2.  Lumbar laminectomy.  3.  Sinus surgery.  4.  Knee arthroscopy.  5.  Colonoscopy with findings of polyps.   MEDICATIONS:  None.   ALLERGIES:  PENICILLIN.   SOCIAL HISTORY:  He is married and retired.  He smokes two packs of  cigarettes a day and has x50 years.  He does not drink alcohol.   FAMILY HISTORY:  Noncontributory.   REVIEW OF SYSTEMS:  GENERAL:  No fevers, chills, malaise or weight change.  RESPIRATORY:  Denies shortness of breath, cough, wheezing.  CARDIAC:  Denies  chest pains, palpitations or history of heart disease.  GASTROINTESTINAL:  As above.  GENITOURINARY:  As above.  MUSCULOSKELETAL:  Some mild back and  joint pain.  NEUROLOGIC:  No syncope, numbness or weakness.  No CVA.  ENDOCRINE:  No history of diabetes.  HEMATOLOGIC:  No history of blood clots  or abnormal bleeding.   PHYSICAL EXAMINATION:  VITAL SIGNS:  Temperature 97.8, pulse 96,  respirations 20, blood pressure 142/83.  GENERAL:  Well-developed, white male in on acute distress.  SKIN:  Warm and dry without rash or infection.  HEENT:  No palpable masses or thyromegaly.  Sclerae nonicteric.  Nares and  oropharynx clear.  LUNGS:  Clear to auscultation without increased work of breathing or  wheezing.  CARDIAC:  Regular rate and rhythm without murmurs.  Peripheral pulses  intact.  No edema or JVD.  ABDOMEN:  There is well-localized, marked right lower quadrant tenderness  with guarding and evidence of local peritonitis.  No palpable masses or  hepatosplenomegaly.  GENITALIA:  Normal.  EXTREMITIES:  No joint swelling or deformity.  NEUROLOGIC:  Alert and oriented.  Motor and sensory exam is grossly normal.   LABORATORY DATA AND X-RAY FINDINGS:  Urinalysis negative.  White count  elevated at 17.2, hemoglobin 16.1.  LFTs normal.  Electrolytes normal.   CT scan of the abdomen was obtained in the emergency room which shows  evidence of acute appendicitis without rupture or abscess.   ASSESSMENT/PLAN:  Apparent acute appendicitis.  The patient is being given  broad-spectrum antibiotics.  He will be taken to the operating room for  emergency laparoscopic appendectomy.                                                Lorne Skeens. Hoxworth, M.D.    Tory Emerald  D:  11/23/2003  T:  11/23/2003  Job:  [id number]

## 2010-07-31 NOTE — Discharge Summary (Signed)
NAMELAM, MCCUBBINS NO.:  [id number]   MEDICAL RECORD NO.:  [id number]          PATIENT TYPE:  INP   LOCATION:  3013                         FACILITY:  [facility]   PHYSICIAN:  Danae Orleans. Venetia Maxon, M.D.  DATE OF BIRTH:  [birth year]   DATE OF ADMISSION:  08/25/2006  DATE OF DISCHARGE:  08/29/2006                               DISCHARGE SUMMARY   REASON FOR ADMISSION:  1. Cervical spondylitic myelopathy.  2. Pseudoarthrosis  3. Cervical spondylosis  4. Cervical myelopathy  5. Cervical radiculopathy   FINAL DIAGNOSES:  1. Cervical spondylitic myelopathy.  2. Pseudoarthrosis  3. Cervical spondylosis  4. Cervical myelopathy  5. Cervical radiculopathy   HOSPITAL COURSE:  [name] is a 75 year old man with cervical  spondylosis and stenosis.  He is under a previous surgery with clear  evidence of pseudoarthrosis.  It was elected to take him to surgery for  decompression posteriorly from C3-C6 levels with segmental  instrumentation and arthrodesis, C3-C7 with bone morphogenic protein  autograft and allograft in addition to decompression.  The patient had a  drain.  Postoperatively he was doing well, postoperatively gradually  mobilized, drain was discontinued.  He was discharged home on the 16th  in stable and satisfactory condition having tolerated the surgery well  with instructions to follow-up in 1 week for suture removal.   DISCHARGE MEDICATIONS:  Percocet, Valium, Lyrica   PATIENT STATUS TIME OF DISCHARGE:  Improved.      Danae Orleans. Venetia Maxon, M.D.  Electronically Signed     JDS/MEDQ  D:  11/17/2006  T:  11/17/2006  Job:  [id number]

## 2010-07-31 NOTE — Op Note (Signed)
NAME:  [name], [name]                           ACCOUNT NO.:  [id number]   MEDICAL RECORD NO.:  [id number]                   PATIENT TYPE:  INP   LOCATION:  3013                                 FACILITY:  [facility]   PHYSICIAN:  Reinaldo Meeker, M.D.              DATE OF BIRTH:  25-Jun-1935   DATE OF PROCEDURE:  06/10/2003  DATE OF DISCHARGE:                                 OPERATIVE REPORT   PREOPERATIVE DIAGNOSIS:  Spinal stenosis at C3-4, C4-5 with spinal cord  decompression.   POSTOPERATIVE DIAGNOSIS:  Spinal stenosis at C3-4, C4-5 with spinal cord  decompression.   PROCEDURE:  C3-4 and C4-5 anterior cervical diskectomy with bone bank fusion  followed by Atlantis anterior cervical plating with operating microscope.   SURGEON:  Reinaldo Meeker, M.D.   ASSISTANT:  Tia Alert, M.D.]   DESCRIPTION OF PROCEDURE:  After being placed in the supine position in five  pounds of halter traction, the patient's neck was prepped and draped in the  usual sterile fashion.  A localizing x-ray was taken prior to the incision  to identify the appropriate level.  A transverse incision was made in the  right anterior neck starting in the midline and headed towards the medial  aspect of the sternocleidomastoid muscle.  The platysmal muscle was then  incised transversely.  The natural fascial plane between the strap muscles  medially and the sternocleidomastoid laterally was identified and followed  down to the anterior aspect of the cervical spine.  The longus coli muscles  were identified and split in the midline, stripped away bilaterally with the  Barista and the Art therapist.  The self retaining retractor was  placed for exposure.  X-rays showed the approach to the appropriate level.  Using a #15 blade, the ends of the disks at C3-4 and C4-5 were incised.  Using pituitary rongeurs and curettes, approximately 90% of the disk  material was removed. A high speed drill was used to widen  the interspace.  The microscope was draped, brought onto the field and used until the end of  the case.  Starting at C3-4 using the microdissection technique, the  remainder of the disk material down the posterior longitudinal ligament was  removed.  The ligament was removed in numerous layers.  There was an  inherent layer to the dura and this was left intact because the spinal dura  was found to be well decompressed.  Proximal fragment decompression was  carried out bilaterally until the spinal dura was well decompressed. A  similar procedure was then carried out at C4-5 once again removing the  remainder of the disk to widen the interspace and down the posterior  longitudinal ligament.  This level of the ligament was completely removed in  order to decompress the underlying spinal dura.  A proximal foraminal  decompression was carried out bilaterally.  At this point, inspection was  carried out at both levels for any evidence of any residual compression and  none could be identified.  Measurements were taken.  Two bone bank plugs  were reconstituted.  After again once more confirming hemostasis, the plugs  were impacted with a 6 mm plug being impacted in C3-4 and a 7 mm plug being  impacted in C4-5.  Fluoroscopy showed the plugs to be in good position.  At  this time, the appropriate length Atlantis anterior cervical plate was  chosen.  Under fluoroscopic guidance drill holes were placed followed by  tapping and placing the 15 mm screws times six.  Final fluoroscopy showed  the plate, screws and plugs to all be in good position. Large amounts of  irrigation were carried out at this time and any bleeding controlled with  bipolar  coagulation and Gelfoam.  The wound was then closed using interrupted  Vicuron on the platysma.  The muscle was inverted with 5-0 PDS in  subcuticular layers and Steri-Strips on the skin. A sterile dressing and  soft collar were applied.  The patient was  extubated and taken to the  recovery room in stable condition.                                               Reinaldo Meeker, M.D.    ROK/MEDQ  D:  06/10/2003  T:  06/10/2003  Job:  [id number]

## 2010-08-20 ENCOUNTER — Telehealth: Payer: Self-pay | Admitting: Cardiology

## 2010-08-20 NOTE — Telephone Encounter (Signed)
Patient is returning Kelly's call.

## 2010-08-20 NOTE — Telephone Encounter (Signed)
Notified of transfer to Dr. Shirlee Latch. He will be notified for a 6 mo ov.

## 2010-11-02 ENCOUNTER — Encounter: Payer: Self-pay | Admitting: Cardiology

## 2010-11-04 ENCOUNTER — Encounter: Payer: Self-pay | Admitting: Cardiology

## 2010-11-04 ENCOUNTER — Ambulatory Visit (INDEPENDENT_AMBULATORY_CARE_PROVIDER_SITE_OTHER): Payer: Medicare Other | Admitting: Cardiology

## 2010-11-04 VITALS — BP 110/80 | HR 65 | Resp 18 | Ht 71.0 in | Wt 212.1 lb

## 2010-11-04 DIAGNOSIS — M79606 Pain in leg, unspecified: Secondary | ICD-10-CM | POA: Insufficient documentation

## 2010-11-04 DIAGNOSIS — E785 Hyperlipidemia, unspecified: Secondary | ICD-10-CM

## 2010-11-04 DIAGNOSIS — I2510 Atherosclerotic heart disease of native coronary artery without angina pectoris: Secondary | ICD-10-CM

## 2010-11-04 DIAGNOSIS — M79609 Pain in unspecified limb: Secondary | ICD-10-CM

## 2010-11-04 NOTE — Assessment & Plan Note (Signed)
Leg pain with standing, worse with walking.  Suspect this is due to spinal stenosis rather than PAD as he has reasonable pedal pulses.

## 2010-11-04 NOTE — Patient Instructions (Signed)
Your physician wants you to follow-up in:  6 months in [place].   You will receive a reminder letter in the mail two months in advance. If you don't receive a letter, please call our office to schedule the follow-up appointment.  Your physician has requested that you have an echocardiogram. Echocardiography is a painless test that uses sound waves to create images of your heart. It provides your doctor with information about the size and shape of your heart and how well your heart's chambers and valves are working. This procedure takes approximately one hour. There are no restrictions for this procedure.  Echocardiogram to be scheduled at the [facility] office

## 2010-11-04 NOTE — Assessment & Plan Note (Signed)
Stable s/p NSTEMI with DES x 2 to RCA in 1/11.  Continue ASA 81 mg daily, Toprol XL, and Crestor.  I encouraged him to try to limit Ibuprofen and to use Tylenol as much as possible for pain control.  I will get an echo to assess LV systolic function (mildly depressed on LV-gram at time of NSTEMI).

## 2010-11-04 NOTE — Assessment & Plan Note (Signed)
Goal LDL < 70.  Lipids were apparently done recently by PCP.  I will ask for a copy.

## 2010-11-04 NOTE — Progress Notes (Signed)
PCP: Dr. Rene Paci  75 yo with history of CAD s/p NSTEMI/PCI in 1/11 and L-spine stenosis with multiple prior back surgeries presents for cardiology followup.  Patient has seen Dr. Deborah Chalk in the past and is seeing me for the first time.  He had 2 DES placed in the RCA in 1/11 at the time of his NSTEMI.  He was on Effient until 2/12.  EF was 50% at time of cath.  He had a myoview in 8/11 that showed no ischemia or infarction.  This was done in anticipation of back surgery, but he did not have the surgery done.    Patient's main complaint is chronic low back pain that is worse with standing and walking.  He has had numerous operations on his C-spine and L-spine.  He has L-spine stenosis.  At this point, the pain is tolerable.  He has seen a number of doctors in consultation about his back pain and has decided against additional surgical intervention.   Patient has had no recent chest pain.  He is able to walk on flat ground without exertional dyspnea.  He is limited mainly by back and leg pain.    ECG: NSR, TWI V1 and V2  PMH: 1. CAD: NSTEMI in 12/10.  Patient had DES x 2 to RCA on 03/17/09.  EF 50% on LV-gram at that time.  Myoview in 8/11 showed EF 58%, no ischemia or infarction.  2. Hyperlipidemia 3. CLL 4. Erectile dysfunction 5. BPH 6. COPD 7. C-spine surgery after trauma in 2005 and 2008.  8. AAA: last measurement 2.7 cm.  9. Appendectomy 10. GERD 11. L-spine disc disease and spinal stenosis with multiple surgeries.  Has chronic low back and leg pain.   SH: Stopped smoking in 2008.  Married, lives in [place].   FH: Father died of lung cancer.  Mother with dementia.   ROS: All systems reviewed and negative except as per HPI.   Current Outpatient Prescriptions  Medication Sig Dispense Refill  . aspirin 81 MG tablet Take 81 mg by mouth daily.        Marland Kitchen gabapentin (NEURONTIN) 300 MG capsule Take 300 mg by mouth 4 (four) times daily.        Marland Kitchen ibuprofen (ADVIL,MOTRIN) 200 MG tablet Take 400  mg by mouth 4 (four) times daily.        . metoprolol succinate (TOPROL-XL) 25 MG 24 hr tablet Take 25 mg by mouth daily.       . rosuvastatin (CRESTOR) 10 MG tablet Take 10 mg by mouth daily.       . Tamsulosin HCl (FLOMAX) 0.4 MG CAPS Take 0.4 mg by mouth daily.        . timolol (BETIMOL) 0.5 % ophthalmic solution Place 1 drop into both eyes 2 (two) times daily.        . Travoprost, BAK Free, (TRAVATAMN) 0.004 % SOLN ophthalmic solution Place 1 drop into both eyes at bedtime.          BP 110/80  Pulse 65  Resp 18  Ht 5\' 11"  (1.803 m)  Wt 212 lb 1.9 oz (96.217 kg)  BMI 29.58 kg/m2 General: NAD Neck: No JVD, no thyromegaly or thyroid nodule.  Lungs: Clear to auscultation bilaterally with normal respiratory effort. CV: Nondisplaced PMI.  Heart regular S1/S2, no S3/S4, no murmur.  No peripheral edema.  No carotid bruit.  1+ PT pulses bilaterally.  Abdomen: Soft, nontender, no hepatosplenomegaly, no distention.  Neurologic: Alert and oriented x 3.  Psych: Normal affect. Extremities: No clubbing or cyanosis.

## 2010-11-24 ENCOUNTER — Other Ambulatory Visit (INDEPENDENT_AMBULATORY_CARE_PROVIDER_SITE_OTHER): Payer: Medicare Other | Admitting: *Deleted

## 2010-11-24 DIAGNOSIS — I2510 Atherosclerotic heart disease of native coronary artery without angina pectoris: Secondary | ICD-10-CM

## 2010-11-30 ENCOUNTER — Telehealth: Payer: Self-pay | Admitting: Cardiology

## 2010-11-30 NOTE — Telephone Encounter (Signed)
Pt rtn call to get echo results °

## 2010-11-30 NOTE — Telephone Encounter (Signed)
Pt aware of results of echo 

## 2010-12-14 LAB — CBC
HCT: 43.1
Hemoglobin: 14.8
MCHC: 34.2
MCV: 96.4
Platelets: 242
RBC: 4.47
RDW: 14
WBC: 14.2 — ABNORMAL HIGH

## 2010-12-15 LAB — COMPREHENSIVE METABOLIC PANEL
ALT: 21
AST: 18
Albumin: 3.2 — ABNORMAL LOW
Alkaline Phosphatase: 73
BUN: 17
CO2: 27
Calcium: 9.2
Chloride: 105
Creatinine, Ser: 0.93
GFR calc Af Amer: >60
GFR calc non Af Amer: >60
Glucose, Bld: 92
Potassium: 3.9
Sodium: 140
Total Bilirubin: 0.7
Total Protein: 6.9

## 2010-12-15 LAB — POCT CARDIAC MARKERS
CKMB, poc: 1.2
CKMB, poc: 1.4
Myoglobin, poc: 47
Myoglobin, poc: 73.9
Troponin i, poc: <0.05
Troponin i, poc: <0.05

## 2010-12-15 LAB — DIFFERENTIAL
Basophils Absolute: 0
Basophils Relative: 0
Eosinophils Absolute: 0.2
Eosinophils Relative: 1
Lymphocytes Relative: 35
Lymphs Abs: 6.2 — ABNORMAL HIGH
Monocytes Absolute: 0.9
Monocytes Relative: 5
Neutro Abs: 10.5 — ABNORMAL HIGH
Neutrophils Relative %: 59

## 2010-12-15 LAB — CBC
HCT: 44.5
Hemoglobin: 15.2
MCHC: 34.2
MCV: 95.2
Platelets: 301
RBC: 4.68
RDW: 13.6
WBC: 17.8 — ABNORMAL HIGH

## 2010-12-15 LAB — URINALYSIS, ROUTINE W REFLEX MICROSCOPIC
Bilirubin Urine: NEGATIVE
Glucose, UA: NEGATIVE
Ketones, ur: NEGATIVE
Leukocytes, UA: NEGATIVE
Nitrite: NEGATIVE
Protein, ur: NEGATIVE
Specific Gravity, Urine: 1.023
Urobilinogen, UA: 0.2
pH: 5.5

## 2010-12-15 LAB — LIPID PANEL
Cholesterol: 171
HDL: 32 — ABNORMAL LOW
LDL Cholesterol: 112 — ABNORMAL HIGH
Total CHOL/HDL Ratio: 5.3
Triglycerides: 133
VLDL: 27

## 2010-12-15 LAB — CARDIAC PANEL(CRET KIN+CKTOT+MB+TROPI)
CK, MB: 1.6
Relative Index: INVALID
Total CK: 39

## 2010-12-15 LAB — PROTIME-INR
INR: 1
Prothrombin Time: 13.8

## 2010-12-15 LAB — POCT I-STAT, CHEM 8
BUN: 19
Calcium, Ion: 1.18
Chloride: 105
Creatinine, Ser: 1.2
Glucose, Bld: 95
HCT: 45
Hemoglobin: 15.3
Potassium: 3.8
Sodium: 141
TCO2: 28

## 2010-12-15 LAB — CK TOTAL AND CKMB (NOT AT ARMC)
CK, MB: 1.7
Relative Index: INVALID
Total CK: 39

## 2010-12-15 LAB — URINE MICROSCOPIC-ADD ON

## 2010-12-15 LAB — APTT: aPTT: 30

## 2010-12-15 LAB — B-NATRIURETIC PEPTIDE (CONVERTED LAB): Pro B Natriuretic peptide (BNP): <30

## 2010-12-15 LAB — TROPONIN I

## 2010-12-15 LAB — PATHOLOGIST SMEAR REVIEW

## 2010-12-30 LAB — COMPREHENSIVE METABOLIC PANEL
ALT: 28
AST: 25
Albumin: 2.8 — ABNORMAL LOW
Alkaline Phosphatase: 76
BUN: 15
CO2: 31
Calcium: 8.8
Chloride: 97
Creatinine, Ser: 1.14
GFR calc Af Amer: >60
GFR calc non Af Amer: >60
Glucose, Bld: 148 — ABNORMAL HIGH
Potassium: 4.7
Sodium: 135
Total Bilirubin: 0.8
Total Protein: 6.5

## 2010-12-30 LAB — CULTURE, BLOOD (ROUTINE X 2)
Culture: NO GROWTH
Culture: NO GROWTH
Culture: NO GROWTH
Culture: NO GROWTH

## 2010-12-30 LAB — CBC
HCT: 33.6 — ABNORMAL LOW
HCT: 34.8 — ABNORMAL LOW
HCT: 37 — ABNORMAL LOW
HCT: 38.6 — ABNORMAL LOW
Hemoglobin: 11.4 — ABNORMAL LOW
Hemoglobin: 11.7 — ABNORMAL LOW
Hemoglobin: 12.7 — ABNORMAL LOW
Hemoglobin: 13.3
MCHC: 33.7
MCHC: 33.9
MCHC: 34.3
MCHC: 34.5
MCV: 93.6
MCV: 94.1
MCV: 94.6
MCV: 94.7
Platelets: 205
Platelets: 222
Platelets: 223
Platelets: 231
RBC: 3.55 — ABNORMAL LOW
RBC: 3.7 — ABNORMAL LOW
RBC: 3.91 — ABNORMAL LOW
RBC: 4.12 — ABNORMAL LOW
RDW: 12.3
RDW: 12.5
RDW: 12.7
RDW: 12.8
WBC: 14.1 — ABNORMAL HIGH
WBC: 18.2 — ABNORMAL HIGH
WBC: 19.7 — ABNORMAL HIGH
WBC: 9.3

## 2010-12-30 LAB — URINALYSIS, ROUTINE W REFLEX MICROSCOPIC
Bilirubin Urine: NEGATIVE
Glucose, UA: NEGATIVE
Ketones, ur: NEGATIVE
Nitrite: NEGATIVE
Protein, ur: 100 — AB
Specific Gravity, Urine: 1.019
Urobilinogen, UA: 1
pH: 7.5

## 2010-12-30 LAB — DIFFERENTIAL
Basophils Absolute: 0
Basophils Relative: 0
Eosinophils Absolute: 0.1
Eosinophils Relative: 1
Lymphocytes Relative: 13
Lymphs Abs: 2.4
Monocytes Absolute: 1.3 — ABNORMAL HIGH
Monocytes Relative: 7
Neutro Abs: 14.4 — ABNORMAL HIGH
Neutrophils Relative %: 79 — ABNORMAL HIGH

## 2010-12-30 LAB — BASIC METABOLIC PANEL
BUN: 12
CO2: 28
Calcium: 8.5
Chloride: 106
Creatinine, Ser: 0.87
GFR calc Af Amer: >60
GFR calc non Af Amer: >60
Glucose, Bld: 119 — ABNORMAL HIGH
Potassium: 3.7
Sodium: 140

## 2010-12-30 LAB — URINE CULTURE: Colony Count: >=100000

## 2010-12-30 LAB — URINE MICROSCOPIC-ADD ON

## 2010-12-31 LAB — CBC
HCT: 47
Hemoglobin: 15.8
MCHC: 33.7
MCV: 95.2
Platelets: 202
RBC: 4.93
RDW: 12.2
WBC: 11.3 — ABNORMAL HIGH

## 2010-12-31 LAB — TYPE AND SCREEN
ABO/RH(D): A NEG
Antibody Screen: NEGATIVE

## 2010-12-31 LAB — BASIC METABOLIC PANEL
BUN: 13
CO2: 28
Calcium: 9.4
Chloride: 105
Creatinine, Ser: 0.81
GFR calc Af Amer: >60
GFR calc non Af Amer: >60
Glucose, Bld: 85
Potassium: 4
Sodium: 139

## 2010-12-31 LAB — ABO/RH: ABO/RH(D): A NEG

## 2011-03-16 HISTORY — PX: CORONARY ANGIOPLASTY WITH STENT PLACEMENT: SHX49

## 2011-05-04 ENCOUNTER — Ambulatory Visit (INDEPENDENT_AMBULATORY_CARE_PROVIDER_SITE_OTHER): Payer: Medicare Other | Admitting: Cardiology

## 2011-05-04 ENCOUNTER — Encounter: Payer: Self-pay | Admitting: Cardiology

## 2011-05-04 DIAGNOSIS — I2510 Atherosclerotic heart disease of native coronary artery without angina pectoris: Secondary | ICD-10-CM

## 2011-05-04 DIAGNOSIS — I714 Abdominal aortic aneurysm, without rupture, unspecified: Secondary | ICD-10-CM

## 2011-05-04 DIAGNOSIS — I2589 Other forms of chronic ischemic heart disease: Secondary | ICD-10-CM

## 2011-05-04 DIAGNOSIS — I5022 Chronic systolic (congestive) heart failure: Secondary | ICD-10-CM

## 2011-05-04 DIAGNOSIS — E785 Hyperlipidemia, unspecified: Secondary | ICD-10-CM

## 2011-05-04 DIAGNOSIS — I255 Ischemic cardiomyopathy: Secondary | ICD-10-CM | POA: Insufficient documentation

## 2011-05-04 LAB — BASIC METABOLIC PANEL
BUN: 18 mg/dL (ref 6–23)
CO2: 30 mEq/L (ref 19–32)
Calcium: 9 mg/dL (ref 8.4–10.5)
Chloride: 104 mEq/L (ref 96–112)
Creatinine, Ser: 1.1 mg/dL (ref 0.4–1.5)
GFR: 68.98 mL/min (ref >60.00)
Glucose, Bld: 121 mg/dL — ABNORMAL HIGH (ref 70–99)
Potassium: 4.9 mEq/L (ref 3.5–5.1)
Sodium: 140 mEq/L (ref 135–145)

## 2011-05-04 MED ORDER — LISINOPRIL 5 MG PO TABS: 5.0000 mg | ORAL_TABLET | Freq: Every day | ORAL | Status: DC

## 2011-05-04 NOTE — Assessment & Plan Note (Signed)
Stable s/p NSTEMI with DES x 2 to RCA in 1/11.  Continue ASA 81 mg daily, Toprol XL, and Crestor.

## 2011-05-04 NOTE — Progress Notes (Signed)
PCP: Dr. Rene Paci  76 yo with history of CAD s/p NSTEMI/PCI in 1/11 and L-spine stenosis with multiple prior back surgeries presents for cardiology followup.  He had 2 DES placed in the RCA in 1/11 at the time of his NSTEMI.  He was on Effient until 2/12.  He had a myoview in 8/11 that showed no ischemia or infarction.  This was done in anticipation of back surgery, but he did not have the surgery done.  Last echo in 9/12 showed EF 45-50%.    Patient's main complaint is chronic low back pain that is worse with standing and walking.  He has had numerous operations on his C-spine and L-spine.  He has L-spine stenosis.  At this point, the pain is tolerable.  He has seen a number of doctors in consultation about his back pain and has decided against additional surgical intervention.   Patient has had no recent chest pain.  He is able to walk on flat ground without exertional dyspnea.  He has some shortness of breath walking up hills.  He is limited mainly by back and leg pain.    ECG: NSR with PVC  Labs (3/12): K 5, creatinine 1.05, LDL 42, HDL 41  PMH: 1. CAD: NSTEMI in 12/10.  Patient had DES x 2 to RCA on 03/17/09. Myoview in 8/11 showed EF 58%, no ischemia or infarction.  2. Hyperlipidemia 3. CLL 4. Erectile dysfunction 5. BPH 6. COPD 7. C-spine surgery after trauma in 2005 and 2008.  8. AAA: PCP follows with serial ultrasounds.  Has been stable, around 3 cm.  9. Appendectomy 10. GERD 11. L-spine disc disease and spinal stenosis with multiple surgeries.  Has chronic low back and leg pain.  12. Ischemic cardiomyopathy: echo (9/12) with EF 45-50%.   SH: Stopped smoking in 2008.  Married, lives in [place].   FH: Father died of lung cancer.  Mother with dementia.   ROS: All systems reviewed and negative except as per HPI.   Current Outpatient Prescriptions  Medication Sig Dispense Refill  . aspirin 81 MG tablet Take 81 mg by mouth daily.        Marland Kitchen gabapentin (NEURONTIN) 300 MG capsule Take  300 mg by mouth 4 (four) times daily.        Marland Kitchen ibuprofen (ADVIL,MOTRIN) 200 MG tablet Take 400 mg by mouth 4 (four) times daily.        . metoprolol succinate (TOPROL-XL) 25 MG 24 hr tablet Take 25 mg by mouth daily.       . rosuvastatin (CRESTOR) 10 MG tablet Take 10 mg by mouth daily.       . Tamsulosin HCl (FLOMAX) 0.4 MG CAPS Take 0.4 mg by mouth daily.        . timolol (BETIMOL) 0.5 % ophthalmic solution Place 1 drop into both eyes 2 (two) times daily.        . Travoprost, BAK Free, (TRAVATAMN) 0.004 % SOLN ophthalmic solution Place 1 drop into both eyes at bedtime.          BP 122/78  Pulse 75  Ht 5\' 11"  (1.803 m)  Wt 215 lb 6.4 oz (97.705 kg)  BMI 30.04 kg/m2 General: NAD Neck: No JVD, no thyromegaly or thyroid nodule.  Lungs: Clear to auscultation bilaterally with normal respiratory effort. CV: Nondisplaced PMI.  Heart regular S1/S2, no S3/S4, no murmur.  No peripheral edema.  No carotid bruit.  1+ PT pulses bilaterally.  Abdomen: Soft, nontender, no hepatosplenomegaly, no distention.  Neurologic: Alert and oriented x 3.  Psych: Normal affect. Extremities: No clubbing or cyanosis.

## 2011-05-04 NOTE — Assessment & Plan Note (Signed)
He will have labs drawn with PCP visit in 4/13.  He will fax a copy here.  Goal LDL < 70.

## 2011-05-04 NOTE — Assessment & Plan Note (Addendum)
EF 45-50% on echo in 9/12.  He is not volume overloaded on exam.  He is on Toprol XL.  I will get a BMET today.  If K is not high (was 5 in 3/12), will add lisinopril 5 mg daily.

## 2011-05-04 NOTE — Assessment & Plan Note (Signed)
He has had a stable 3 cm AAA.  He says that his PCP schedules an abdominal US every April.

## 2011-05-04 NOTE — Patient Instructions (Signed)
Lab today--BMET.  I will call you about your lab results. If your potassium is OK on the lab done today start lisinopril 5mg  daily. You have the prescription.  Please ask your doctor to send the lab results and abdominal ultrasound result you are going to have in April to Dr Shirlee Latch. The fax number is [phone].  Your physician wants you to follow-up in: 6 months with Dr Shirlee Latch. ((August 2013).You will receive a reminder letter in the mail two months in advance. If you don't receive a letter, please call our office to schedule the follow-up appointment.

## 2011-06-16 ENCOUNTER — Encounter: Payer: Self-pay | Admitting: Cardiology

## 2011-06-16 ENCOUNTER — Other Ambulatory Visit (HOSPITAL_COMMUNITY): Payer: Self-pay | Admitting: Internal Medicine

## 2011-06-16 DIAGNOSIS — J449 Chronic obstructive pulmonary disease, unspecified: Secondary | ICD-10-CM

## 2011-06-18 ENCOUNTER — Other Ambulatory Visit (HOSPITAL_COMMUNITY): Payer: Self-pay | Admitting: Radiology

## 2011-06-18 ENCOUNTER — Ambulatory Visit (HOSPITAL_COMMUNITY)
Admission: RE | Admit: 2011-06-18 | Discharge: 2011-06-18 | Disposition: A | Discharge status: Home / Self Care | Payer: Medicare Other | Source: Ambulatory Visit | Attending: Internal Medicine | Admitting: Internal Medicine

## 2011-06-18 DIAGNOSIS — J449 Chronic obstructive pulmonary disease, unspecified: Secondary | ICD-10-CM | POA: Insufficient documentation

## 2011-06-18 DIAGNOSIS — J4489 Other specified chronic obstructive pulmonary disease: Secondary | ICD-10-CM | POA: Insufficient documentation

## 2011-06-18 LAB — PULMONARY FUNCTION TEST

## 2011-06-18 MED ORDER — ALBUTEROL SULFATE (5 MG/ML) 0.5% IN NEBU
2.5000 mg | INHALATION_SOLUTION | Freq: Once | RESPIRATORY_TRACT | Status: AC
  Administered 2011-06-18: 2.5 mg via RESPIRATORY_TRACT

## 2011-06-18 MED ORDER — ALBUTEROL SULFATE (5 MG/ML) 0.5% IN NEBU: 2.5000 mg | INHALATION_SOLUTION | Freq: Once | RESPIRATORY_TRACT | Status: DC

## 2011-08-05 ENCOUNTER — Institutional Professional Consult (permissible substitution): Payer: Medicare Other | Admitting: Pulmonary Disease

## 2011-08-06 ENCOUNTER — Ambulatory Visit (INDEPENDENT_AMBULATORY_CARE_PROVIDER_SITE_OTHER): Payer: Medicare Other | Admitting: Internal Medicine

## 2011-08-06 ENCOUNTER — Encounter: Payer: Self-pay | Admitting: Internal Medicine

## 2011-08-06 VITALS — BP 130/72 | HR 79 | Ht 71.0 in | Wt 216.4 lb

## 2011-08-06 DIAGNOSIS — J4489 Other specified chronic obstructive pulmonary disease: Secondary | ICD-10-CM

## 2011-08-06 DIAGNOSIS — M79606 Pain in leg, unspecified: Secondary | ICD-10-CM

## 2011-08-06 DIAGNOSIS — M79609 Pain in unspecified limb: Secondary | ICD-10-CM

## 2011-08-06 DIAGNOSIS — J449 Chronic obstructive pulmonary disease, unspecified: Secondary | ICD-10-CM

## 2011-08-06 MED ORDER — THEOPHYLLINE ER 300 MG PO CP24: 300.0000 mg | ORAL_CAPSULE | Freq: Every day | ORAL | Status: DC

## 2011-08-06 NOTE — Patient Instructions (Signed)
Order- test for alpha 1 antitrypsin deficiency  Script sent for theophylline  Order- schedule 6 minute walk test   Dx COPD  My staff will request latest CXR report from Dr Venita Sheffield office, and also ask what information they have on file about your intolerance to Symbicort and Spiriva.  For the leg cramps- it sometimes helps to have an OJ glass of tonic water, flavored with orange juice or tomato juice, at supper time.  Assess the Advair by skipping it for a week, then starting it back again.

## 2011-08-06 NOTE — Progress Notes (Signed)
08/06/11- 61 yoM former smoker referred by Dr.Husain because of COPD. Wife is here. He smoked 2 packs per day, stopping in 2008. Had pneumonia vaccine in 2008 and flu vaccine in 2012 Diagnosed with COPD in 2005. He can't tell if Advair or Proair help. He has a nebulizer with no medication. Spiriva caused rash, swelling and itching. He had intolerance to Symbicort but doesn't remember details.  Hip pain limits his walking before shortness of breath does. He notices shortness of breath with activity but little cough and no phlegm or wheezing. He does not use oxygen. PFT 06/18/2011-moderate obstructive airways disease with slight response to bronchodilator, mild restriction, severe reduction in diffusion capacity. FEV1 1.88/60%, FEV1/FVC 0.59. Insignificant response to bronchodilator. Comorbidities include glaucoma, cardiovascular disease with history of myocardial infarction/ stents and stable  abdominal aortic aneurysm. Family history a brother with asthma and father who smoked. They both had lung cancer. 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  Prior to Admission medications   Medication Sig Start Date End Date Taking? Authorizing Provider  albuterol (PROAIR HFA) 108 (90 BASE) MCG/ACT inhaler Inhale 2 puffs into the lungs every 6 (six) hours as needed. For shortness of breath   Yes Historical Provider, MD  aspirin 81 MG tablet Take 81 mg by mouth daily.    Yes Historical Provider, MD  bimatoprost (LUMIGAN) 0.03 % ophthalmic solution Place 1 drop into both eyes at bedtime.   Yes Historical Provider, MD  Fluticasone-Salmeterol (ADVAIR DISKUS) 250-50 MCG/DOSE AEPB Inhale 1 puff into the lungs every 12 (twelve) hours.   Yes Historical Provider, MD  gabapentin (NEURONTIN) 300 MG capsule Take 600 mg by mouth 2 (two) times daily.    Yes Historical Provider, MD  hydrOXYzine (ATARAX/VISTARIL) 25 MG  tablet Take 25 mg by mouth every 8 (eight) hours as needed. For itching   Yes Historical Provider, MD  ibuprofen (ADVIL,MOTRIN) 200 MG tablet Take 400 mg by mouth 2 (two) times daily.    Yes Historical Provider, MD  metoprolol succinate (TOPROL-XL) 25 MG 24 hr tablet Take 25 mg by mouth daily.    Yes Historical Provider, MD  rosuvastatin (CRESTOR) 10 MG tablet Take 10 mg by mouth daily.    Yes Historical Provider, MD  Tamsulosin HCl (FLOMAX) 0.4 MG CAPS Take 0.4 mg by mouth daily.     Yes Historical Provider, MD  timolol (BETIMOL) 0.5 % ophthalmic solution Place 1 drop into both eyes 2 (two) times daily.   Yes Historical Provider, MD  nitroGLYCERIN (NITROSTAT) 0.4 MG SL tablet Place 0.4 mg under the tongue every 5 (five) minutes as needed. For chest pain    Historical Provider, MD  theophylline (THEO-24) 300 MG 24 hr capsule Take 300 mg by mouth daily. With food 08/06/11 08/05/12  Waymon Budge, MD   Past Medical History  Diagnosis Date  . Back pain   . Leg pain   . Fatigue   . Myocardial infarct 03-17-2009    hx of   . COPD (chronic obstructive pulmonary disease)   . BPH (benign prostatic hypertrophy)   . Hyperlipidemia   . Glaucoma   . CLL (chronic lymphocytic leukemia)     hx of  . Erectile dysfunction   . Lumbar disc disease   . Hx of appendectomy   . Osteopenia   . AAA (abdominal aortic aneurysm)     measures 2.7 cm.  Marland Kitchen GERD (gastroesophageal  reflux disease)   . Microscopic hematuria    Past Surgical History  Procedure Date  . Back surgery 1999  . Cervical disc surgery 2005-2008  . Nasal sinus surgery 2005  . Coronary angioplasty with stent placement 03-17-2009    x2. to the right coronary    Family History  Problem Relation Age of Onset  . Lung cancer Father 56    died.   . Alzheimer's disease Mother     died of natural causes   History   Social History  . Marital Status: Married    Spouse Name: N/A    Number of Children: N/A  . Years of Education: N/A    Occupational History  . Not on file.   Social History Main Topics  . Smoking status: Former Smoker    Types: Cigarettes    Quit date: 03/15/2006  . Smokeless tobacco: Not on file  . Alcohol Use: No  . Drug Use: No  . Sexually Active: Not on file   Other Topics Concern  . Not on file   Social History Narrative  . No narrative on file   ROS-see HPI Constitutional:   No-   weight loss, night sweats, fevers, chills, fatigue, lassitude. HEENT:   No-  headaches, difficulty swallowing, tooth/dental problems, sore throat,       No-  sneezing, itching, ear ache, nasal congestion, post nasal drip, + snoring CV:  No-   chest pain, orthopnea, PND, swelling in lower extremities, anasarca, dizziness, palpitations Resp: No-   shortness of breath with exertion or at rest.              No-   productive cough,  No non-productive cough,  No- coughing up of blood.              No-   change in color of mucus.  No- wheezing.   Skin: No-   rash or lesions. GI:  No-   heartburn, indigestion, abdominal pain, nausea, vomiting, GU: No-   dysuria, . MS:  No-   joint pain or swelling.  Neuro-     nothing unusual Psych:  No- change in mood or affect. No depression or anxiety.  No memory loss.  OBJ- Physical Exam General- Alert, Oriented, Affect-appropriate, Distress- none acute, overweight Skin- rash-none, lesions- none, excoriation- none Lymphadenopathy- none Head- atraumatic            Eyes- Gross vision intact, PERRLA, conjunctivae and secretions clear            Ears- Hearingaidsl            Nose- Clear, no-Septal dev, mucus, polyps, erosion, perforation             Throat- Mallampati II , mucosa clear , drainage- none, tonsils- atrophic Neck- flexible , trachea midline, no stridor , thyroid nl, carotid no bruit Chest - symmetrical excursion , unlabored           Heart/CV- RRR , no murmur , no gallop  , no rub, nl s1 s2                           - JVD- none , edema- none, stasis changes- none,  varices- none           Lung- diminished with trace crackles in left base, wheeze- none, cough- none , dullness-none, rub- none           Chest wall-  Abd-  tender-no, distended-no, bowel sounds-present, HSM- no Br/ Gen/ Rectal- Not done, not indicated Extrem- cyanosis- none, clubbing, none, atrophy- none, strength- nl Neuro- grossly intact to observation

## 2011-08-11 ENCOUNTER — Emergency Department (HOSPITAL_COMMUNITY): Payer: Medicare Other

## 2011-08-11 ENCOUNTER — Inpatient Hospital Stay (HOSPITAL_COMMUNITY)
Admission: EM | Admit: 2011-08-11 | Discharge: 2011-08-13 | DRG: 247 | Disposition: A | Discharge status: Home / Self Care | Payer: Medicare Other | Source: Ambulatory Visit | Attending: Cardiology | Admitting: Cardiology

## 2011-08-11 ENCOUNTER — Encounter (HOSPITAL_COMMUNITY): Payer: Self-pay | Admitting: *Deleted

## 2011-08-11 DIAGNOSIS — M899 Disorder of bone, unspecified: Secondary | ICD-10-CM | POA: Diagnosis present

## 2011-08-11 DIAGNOSIS — E785 Hyperlipidemia, unspecified: Secondary | ICD-10-CM

## 2011-08-11 DIAGNOSIS — R079 Chest pain, unspecified: Secondary | ICD-10-CM

## 2011-08-11 DIAGNOSIS — J449 Chronic obstructive pulmonary disease, unspecified: Secondary | ICD-10-CM | POA: Insufficient documentation

## 2011-08-11 DIAGNOSIS — I249 Acute ischemic heart disease, unspecified: Secondary | ICD-10-CM

## 2011-08-11 DIAGNOSIS — I252 Old myocardial infarction: Secondary | ICD-10-CM

## 2011-08-11 DIAGNOSIS — Z882 Allergy status to sulfonamides status: Secondary | ICD-10-CM

## 2011-08-11 DIAGNOSIS — I714 Abdominal aortic aneurysm, without rupture, unspecified: Secondary | ICD-10-CM | POA: Diagnosis present

## 2011-08-11 DIAGNOSIS — Z9861 Coronary angioplasty status: Secondary | ICD-10-CM

## 2011-08-11 DIAGNOSIS — M51379 Other intervertebral disc degeneration, lumbosacral region without mention of lumbar back pain or lower extremity pain: Secondary | ICD-10-CM | POA: Diagnosis present

## 2011-08-11 DIAGNOSIS — Z9089 Acquired absence of other organs: Secondary | ICD-10-CM

## 2011-08-11 DIAGNOSIS — J4489 Other specified chronic obstructive pulmonary disease: Secondary | ICD-10-CM | POA: Diagnosis present

## 2011-08-11 DIAGNOSIS — N400 Enlarged prostate without lower urinary tract symptoms: Secondary | ICD-10-CM | POA: Diagnosis present

## 2011-08-11 DIAGNOSIS — M949 Disorder of cartilage, unspecified: Secondary | ICD-10-CM | POA: Diagnosis present

## 2011-08-11 DIAGNOSIS — Z856 Personal history of leukemia: Secondary | ICD-10-CM

## 2011-08-11 DIAGNOSIS — Z880 Allergy status to penicillin: Secondary | ICD-10-CM

## 2011-08-11 DIAGNOSIS — I200 Unstable angina: Secondary | ICD-10-CM | POA: Diagnosis present

## 2011-08-11 DIAGNOSIS — Z955 Presence of coronary angioplasty implant and graft: Secondary | ICD-10-CM

## 2011-08-11 DIAGNOSIS — Z7982 Long term (current) use of aspirin: Secondary | ICD-10-CM

## 2011-08-11 DIAGNOSIS — G4733 Obstructive sleep apnea (adult) (pediatric): Secondary | ICD-10-CM | POA: Diagnosis present

## 2011-08-11 DIAGNOSIS — K219 Gastro-esophageal reflux disease without esophagitis: Secondary | ICD-10-CM | POA: Diagnosis present

## 2011-08-11 DIAGNOSIS — M5137 Other intervertebral disc degeneration, lumbosacral region: Secondary | ICD-10-CM | POA: Diagnosis present

## 2011-08-11 DIAGNOSIS — I2510 Atherosclerotic heart disease of native coronary artery without angina pectoris: Principal | ICD-10-CM

## 2011-08-11 LAB — POCT I-STAT, CHEM 8
BUN: 28 mg/dL — ABNORMAL HIGH (ref 6–23)
Calcium, Ion: 1.26 mmol/L (ref 1.12–1.32)
Chloride: 104 mEq/L (ref 96–112)
Creatinine, Ser: 1.3 mg/dL (ref 0.50–1.35)
Glucose, Bld: 110 mg/dL — ABNORMAL HIGH (ref 70–99)
HCT: 47 % (ref 39.0–52.0)
Hemoglobin: 16 g/dL (ref 13.0–17.0)
Potassium: 4.9 mEq/L (ref 3.5–5.1)
Sodium: 141 mEq/L (ref 135–145)
TCO2: 31 mmol/L (ref 0–100)

## 2011-08-11 LAB — CBC
HCT: 44.4 % (ref 39.0–52.0)
Hemoglobin: 15.2 g/dL (ref 13.0–17.0)
MCH: 31.7 pg (ref 26.0–34.0)
MCHC: 34.2 g/dL (ref 30.0–36.0)
MCV: 92.5 fL (ref 78.0–100.0)
Platelets: 146 10*3/uL — ABNORMAL LOW (ref 150–400)
RBC: 4.8 MIL/uL (ref 4.22–5.81)
RDW: 13.4 % (ref 11.5–15.5)
WBC: 11.2 10*3/uL — ABNORMAL HIGH (ref 4.0–10.5)

## 2011-08-11 LAB — POCT I-STAT TROPONIN I: Troponin i, poc: 0 ng/mL (ref 0.00–0.08)

## 2011-08-11 LAB — PRO B NATRIURETIC PEPTIDE: Pro B Natriuretic peptide (BNP): 37.1 pg/mL (ref 0–125)

## 2011-08-11 MED ORDER — METOPROLOL SUCCINATE ER 25 MG PO TB24
25.0000 mg | ORAL_TABLET | Freq: Every day | ORAL | Status: DC
  Administered 2011-08-12 – 2011-08-13 (×2): 25 mg via ORAL
  Filled 2011-08-11 (×3): qty 1

## 2011-08-11 MED ORDER — ASPIRIN 325 MG PO TABS: 325.0000 mg | ORAL_TABLET | Freq: Once | ORAL | Status: DC

## 2011-08-11 MED ORDER — ATORVASTATIN CALCIUM 40 MG PO TABS
40.0000 mg | ORAL_TABLET | Freq: Every day | ORAL | Status: DC
  Administered 2011-08-12: 40 mg via ORAL
  Filled 2011-08-11 (×3): qty 1

## 2011-08-11 MED ORDER — ALBUTEROL SULFATE HFA 108 (90 BASE) MCG/ACT IN AERS: 2.0000 | INHALATION_SPRAY | Freq: Four times a day (QID) | RESPIRATORY_TRACT | Status: DC | PRN

## 2011-08-11 MED ORDER — TIMOLOL MALEATE 0.5 % OP SOLN
1.0000 [drp] | Freq: Two times a day (BID) | OPHTHALMIC | Status: DC
  Administered 2011-08-12 – 2011-08-13 (×3): 1 [drp] via OPHTHALMIC
  Filled 2011-08-11 (×2): qty 5

## 2011-08-11 MED ORDER — GABAPENTIN 300 MG PO CAPS
600.0000 mg | ORAL_CAPSULE | Freq: Two times a day (BID) | ORAL | Status: DC
  Administered 2011-08-12 – 2011-08-13 (×3): 600 mg via ORAL
  Filled 2011-08-11 (×6): qty 2

## 2011-08-11 MED ORDER — FLUTICASONE-SALMETEROL 250-50 MCG/DOSE IN AEPB
1.0000 | INHALATION_SPRAY | Freq: Two times a day (BID) | RESPIRATORY_TRACT | Status: DC
  Filled 2011-08-11: qty 14

## 2011-08-11 MED ORDER — ASPIRIN 81 MG PO CHEW
324.0000 mg | CHEWABLE_TABLET | Freq: Once | ORAL | Status: AC
  Administered 2011-08-11: 243 mg via ORAL

## 2011-08-11 MED ORDER — TAMSULOSIN HCL 0.4 MG PO CAPS
0.4000 mg | ORAL_CAPSULE | Freq: Every day | ORAL | Status: DC
  Administered 2011-08-12: 0.4 mg via ORAL
  Filled 2011-08-11 (×3): qty 1

## 2011-08-11 MED ORDER — NITROGLYCERIN 2 % TD OINT
1.0000 [in_us] | TOPICAL_OINTMENT | Freq: Once | TRANSDERMAL | Status: AC
  Administered 2011-08-11: 1 [in_us] via TOPICAL
  Filled 2011-08-11: qty 1

## 2011-08-11 MED ORDER — BIMATOPROST 0.01 % OP SOLN
1.0000 [drp] | Freq: Every day | OPHTHALMIC | Status: DC
  Administered 2011-08-12 (×2): 1 [drp] via OPHTHALMIC
  Filled 2011-08-11 (×2): qty 2.5

## 2011-08-11 MED ORDER — ASPIRIN 81 MG PO CHEW
CHEWABLE_TABLET | ORAL | Status: AC
  Administered 2011-08-11: 243 mg via ORAL
  Filled 2011-08-11: qty 4

## 2011-08-11 NOTE — ED Notes (Signed)
MD at bedside. 

## 2011-08-11 NOTE — Assessment & Plan Note (Signed)
He is describing leg cramps bothering sleep. That is different from claudication. As a minor intervention, I suggested he try a glass of tonic water each evening for the quinine.

## 2011-08-11 NOTE — ED Notes (Signed)
Pt with hx of 2 stent placements and 1 MI c/o chest pain.  Pain began at 1920. Pt took nitro and pain reduced.  Pain began again while on the way to hospital.  Pt took 2nd nitro and it reduced pain as well.  Pt states pain begins on R chest and radiates to L chest, just like last MI.

## 2011-08-11 NOTE — Assessment & Plan Note (Signed)
Arthritic hip pain and possibly some cardiac limitation to exercise may contribute to his dyspnea on exertion and limit his activity. He has a history of intolerance to Spiriva and that class of medication would need to be handled very carefully anyway because of his glaucoma and BPH. He has not demonstrated intolerance to bronchodilator medicines from a cardiac standpoint, but the potential exists. Medication options are limited. Plan-try theophylline. Get reports of April chest x-ray from his primary physician. Schedule 6 minute walk test. Try off of Advair to see if it is really making any difference. Screen for alpha-1 antitrypsin deficiency. Consider pulmonary rehabilitation.

## 2011-08-11 NOTE — ED Provider Notes (Signed)
History     CSN: [id number]  Arrival date & time 08/11/11  2031   First MD Initiated Contact with Patient 08/11/11 2048      Chief Complaint  Patient presents with  . Chest Pain    (Consider location/radiation/quality/duration/timing/severity/associated sxs/prior treatment) HPI Comments: Pain across chest that feels like prior MI.  2 episode sin last hour of 10 mins each.  Improved with nitro.  Dyspnea at time of episodes now improved.  Patient is a 76 y.o. male presenting with chest pain. The history is provided by the patient.  Chest Pain The chest pain began less than 1 hour ago. Duration of episode(s) is 10 minutes. Chest pain occurs intermittently. The chest pain is resolved. Associated with: began at rest. The severity of the pain is moderate. The quality of the pain is described as pressure-like. The pain does not radiate. Pertinent negatives for primary symptoms include no fever, no fatigue, no shortness of breath and no wheezing.     Past Medical History  Diagnosis Date  . Back pain   . Leg pain   . Fatigue   . Myocardial infarct 03-17-2009    hx of   . COPD (chronic obstructive pulmonary disease)   . BPH (benign prostatic hypertrophy)   . Hyperlipidemia   . Glaucoma   . CLL (chronic lymphocytic leukemia)     hx of  . Erectile dysfunction   . Lumbar disc disease   . Hx of appendectomy   . Osteopenia   . AAA (abdominal aortic aneurysm)     measures 2.7 cm.  Marland Kitchen GERD (gastroesophageal reflux disease)   . Microscopic hematuria     Past Surgical History  Procedure Date  . Back surgery 1999  . Cervical disc surgery 2005-2008  . Nasal sinus surgery 2005  . Coronary angioplasty with stent placement 03-17-2009    x2. to the right coronary     Family History  Problem Relation Age of Onset  . Lung cancer Father 37    died.   . Alzheimer's disease Mother     died of natural causes    History  Substance Use Topics  . Smoking status: Former Smoker    Types:  Cigarettes    Quit date: 03/15/2006  . Smokeless tobacco: Not on file  . Alcohol Use: No      Review of Systems  Constitutional: Negative for fever, activity change and fatigue.  HENT: Negative for congestion.   Eyes: Negative for pain.  Respiratory: Negative for chest tightness, shortness of breath, wheezing and stridor.   Cardiovascular: Positive for chest pain. Negative for leg swelling.  Genitourinary: Negative for dysuria.  Musculoskeletal: Negative for arthralgias.  Skin: Negative for rash.  Neurological: Negative for headaches.  Psychiatric/Behavioral: Negative for behavioral problems.    Allergies  Bacitracin; Brimonidine tartrate; Lyrica; Nabumetone; Penicillins; Spiriva; Sulfonamide derivatives; and Symbicort  Home Medications   Current Outpatient Rx  Name Route Sig Dispense Refill  . ALBUTEROL SULFATE HFA 108 (90 BASE) MCG/ACT IN AERS Inhalation Inhale 2 puffs into the lungs every 6 (six) hours as needed. For shortness of breath    . ASPIRIN 81 MG PO TABS Oral Take 81 mg by mouth daily.     Marland Kitchen BIMATOPROST 0.03 % OP SOLN Both Eyes Place 1 drop into both eyes at bedtime.    Marland Kitchen FLUTICASONE-SALMETEROL 250-50 MCG/DOSE IN AEPB Inhalation Inhale 1 puff into the lungs every 12 (twelve) hours.    Marland Kitchen GABAPENTIN 300 MG PO CAPS Oral  Take 600 mg by mouth 2 (two) times daily.     Marland Kitchen HYDROXYZINE HCL 25 MG PO TABS Oral Take 25 mg by mouth every 8 (eight) hours as needed. For itching    . IBUPROFEN 200 MG PO TABS Oral Take 400 mg by mouth 2 (two) times daily.     Marland Kitchen METOPROLOL SUCCINATE ER 25 MG PO TB24 Oral Take 25 mg by mouth daily.     Marland Kitchen NITROGLYCERIN 0.4 MG SL SUBL Sublingual Place 0.4 mg under the tongue every 5 (five) minutes as needed. For chest pain    . ROSUVASTATIN CALCIUM 10 MG PO TABS Oral Take 10 mg by mouth daily.     Marland Kitchen TAMSULOSIN HCL 0.4 MG PO CAPS Oral Take 0.4 mg by mouth daily.      . THEOPHYLLINE ER 300 MG PO CP24 Oral Take 300 mg by mouth daily. With food    . TIMOLOL  HEMIHYDRATE 0.5 % OP SOLN Both Eyes Place 1 drop into both eyes 2 (two) times daily.      BP 118/74  Pulse 88  Temp(Src) 98.5 F (36.9 C) (Oral)  Resp 20  SpO2 93%  Physical Exam  Constitutional: He is oriented to person, place, and time. He appears well-developed and well-nourished. No distress.  HENT:  Head: Normocephalic and atraumatic.  Eyes: Conjunctivae and EOM are normal. Pupils are equal, round, and reactive to light. No scleral icterus.  Neck: Normal range of motion. Neck supple.  Cardiovascular: Normal rate and regular rhythm.  Exam reveals no gallop and no friction rub.   No murmur heard. Pulmonary/Chest: Effort normal and breath sounds normal. No respiratory distress. He has no wheezes. He has no rales. He exhibits no tenderness.  Abdominal: Soft. He exhibits no distension and no mass. There is no tenderness. There is no rebound and no guarding.  Musculoskeletal: Normal range of motion. He exhibits no edema and no tenderness.  Neurological: He is alert and oriented to person, place, and time. He has normal reflexes. No cranial nerve deficit. He exhibits normal muscle tone. Coordination normal.  Skin: Skin is warm and dry. No rash noted. He is not diaphoretic. No erythema.  Psychiatric: He has a normal mood and affect. His behavior is normal. Judgment and thought content normal.    ED Course  Procedures (including critical care time)   Date: 08/11/2011  Rate: 89  Rhythm: normal sinus rhythm  QRS Axis: normal  Intervals: normal  ST/T Wave abnormalities: normal  Conduction Disutrbances:none  Narrative Interpretation:   Old EKG Reviewed: unchanged    Labs Reviewed  CBC - Abnormal; Notable for the following:    WBC 11.2 (*)    Platelets 146 (*)    All other components within normal limits  POCT I-STAT, CHEM 8 - Abnormal; Notable for the following:    BUN 28 (*)    Glucose, Bld 110 (*)    All other components within normal limits  POCT I-STAT TROPONIN I  PRO B  NATRIURETIC PEPTIDE   Dg Chest 2 View  08/11/2011  *RADIOLOGY REPORT*  Clinical Data: Chest pain.  CHEST - 2 VIEW  Comparison: Two-view chest 07/13/2011.  Findings: The heart is mildly enlarged.  There is slight increase in a diffuse interstitial pattern, suggesting mild edema superimposed on chronic change.  Emphysematous changes are again noted.  No focal airspace consolidation is evident.  Degenerative changes of the thoracic spine are stable.  IMPRESSION:  1.  Cardiomegaly and mild edema suggesting early congestive  heart failure. 2.  No focal airspace disease. 3.  Emphysema.  Original Report Authenticated By: Jamesetta Orleans. MATTERN, M.D.     1. Chest pain       MDM  Pain across chest that feels like prior MI.  2 episode sin last hour of 10 mins each.  Improved with nitro.  Dyspnea at time of episodes now improved.  VSS and well appearing.  CXR with mild edema.  EKG, labs unconcerning.  Pain free in ED.  D/w cards - will admit for further management.        Army Chaco, MD 08/11/11 2141

## 2011-08-12 ENCOUNTER — Encounter (HOSPITAL_COMMUNITY)
Admission: EM | Disposition: A | Discharge status: Home / Self Care | Payer: Self-pay | Source: Ambulatory Visit | Attending: Cardiology

## 2011-08-12 DIAGNOSIS — I2510 Atherosclerotic heart disease of native coronary artery without angina pectoris: Secondary | ICD-10-CM

## 2011-08-12 DIAGNOSIS — R079 Chest pain, unspecified: Secondary | ICD-10-CM

## 2011-08-12 HISTORY — PX: LEFT HEART CATHETERIZATION WITH CORONARY ANGIOGRAM: SHX5451

## 2011-08-12 LAB — BASIC METABOLIC PANEL
BUN: 22 mg/dL (ref 6–23)
CO2: 29 mEq/L (ref 19–32)
Calcium: 9.7 mg/dL (ref 8.4–10.5)
Chloride: 101 mEq/L (ref 96–112)
Creatinine, Ser: 1.09 mg/dL (ref 0.50–1.35)
GFR calc Af Amer: 76 mL/min — ABNORMAL LOW (ref >90)
GFR calc non Af Amer: 65 mL/min — ABNORMAL LOW (ref >90)
Glucose, Bld: 123 mg/dL — ABNORMAL HIGH (ref 70–99)
Potassium: 5.6 mEq/L — ABNORMAL HIGH (ref 3.5–5.1)
Sodium: 140 mEq/L (ref 135–145)

## 2011-08-12 LAB — LIPID PANEL
Cholesterol: 128 mg/dL (ref 0–200)
HDL: 67 mg/dL (ref >39)
LDL Cholesterol: 44 mg/dL (ref 0–99)
Total CHOL/HDL Ratio: 1.9 RATIO
Triglycerides: 83 mg/dL (ref <150)
VLDL: 17 mg/dL (ref 0–40)

## 2011-08-12 LAB — POCT ACTIVATED CLOTTING TIME: Activated Clotting Time: 567 seconds

## 2011-08-12 LAB — CARDIAC PANEL(CRET KIN+CKTOT+MB+TROPI)
CK, MB: 4.8 ng/mL — ABNORMAL HIGH (ref 0.3–4.0)
CK, MB: 4.4 ng/mL — ABNORMAL HIGH (ref 0.3–4.0)
Relative Index: 1.8 (ref 0.0–2.5)
Relative Index: 2.3 (ref 0.0–2.5)
Total CK: 273 U/L — ABNORMAL HIGH (ref 7–232)
Total CK: 191 U/L (ref 7–232)
Troponin I: <0.3 ng/mL (ref <0.30)
Troponin I: <0.3 ng/mL (ref <0.30)

## 2011-08-12 LAB — HEPARIN LEVEL (UNFRACTIONATED): Heparin Unfractionated: 0.75 IU/mL — ABNORMAL HIGH (ref 0.30–0.70)

## 2011-08-12 SURGERY — LEFT HEART CATHETERIZATION WITH CORONARY ANGIOGRAM
Anesthesia: LOCAL

## 2011-08-12 MED ORDER — SODIUM CHLORIDE 0.9 % IJ SOLN
3.0000 mL | Freq: Two times a day (BID) | INTRAMUSCULAR | Status: DC
  Administered 2011-08-12: 3 mL via INTRAVENOUS

## 2011-08-12 MED ORDER — SODIUM CHLORIDE 0.9 % IV SOLN: 250.0000 mL | INTRAVENOUS | Status: DC | PRN

## 2011-08-12 MED ORDER — HEPARIN (PORCINE) IN NACL 100-0.45 UNIT/ML-% IJ SOLN
1200.0000 [IU]/h | INTRAMUSCULAR | Status: DC
  Administered 2011-08-12: 1200 [IU]/h via INTRAVENOUS
  Filled 2011-08-12: qty 250

## 2011-08-12 MED ORDER — ASPIRIN 81 MG PO CHEW: 324.0000 mg | CHEWABLE_TABLET | ORAL | Status: DC

## 2011-08-12 MED ORDER — ACETAMINOPHEN 325 MG PO TABS: 650.0000 mg | ORAL_TABLET | ORAL | Status: DC | PRN

## 2011-08-12 MED ORDER — HEPARIN BOLUS VIA INFUSION
4000.0000 [IU] | Freq: Once | INTRAVENOUS | Status: AC
  Administered 2011-08-12: 4000 [IU] via INTRAVENOUS
  Filled 2011-08-12: qty 4000

## 2011-08-12 MED ORDER — ASPIRIN 81 MG PO CHEW
CHEWABLE_TABLET | ORAL | Status: AC
  Filled 2011-08-12: qty 4

## 2011-08-12 MED ORDER — ONDANSETRON HCL 4 MG/2ML IJ SOLN: 4.0000 mg | Freq: Four times a day (QID) | INTRAMUSCULAR | Status: DC | PRN

## 2011-08-12 MED ORDER — HEPARIN (PORCINE) IN NACL 2-0.9 UNIT/ML-% IJ SOLN
INTRAMUSCULAR | Status: AC
  Filled 2011-08-12: qty 1000

## 2011-08-12 MED ORDER — PRASUGREL HCL 10 MG PO TABS
10.0000 mg | ORAL_TABLET | Freq: Every day | ORAL | Status: DC
  Administered 2011-08-13: 10 mg via ORAL
  Filled 2011-08-12: qty 1

## 2011-08-12 MED ORDER — SODIUM CHLORIDE 0.9 % IV SOLN
0.2500 mg/kg/h | INTRAVENOUS | Status: DC
  Filled 2011-08-12: qty 250

## 2011-08-12 MED ORDER — ASPIRIN 300 MG RE SUPP: 300.0000 mg | RECTAL | Status: DC

## 2011-08-12 MED ORDER — SODIUM CHLORIDE 0.9 % IJ SOLN: 3.0000 mL | INTRAMUSCULAR | Status: DC | PRN

## 2011-08-12 MED ORDER — SODIUM CHLORIDE 0.9 % IV SOLN: INTRAVENOUS | Status: AC

## 2011-08-12 MED ORDER — BIVALIRUDIN 250 MG IV SOLR
INTRAVENOUS | Status: AC
  Filled 2011-08-12: qty 250

## 2011-08-12 MED ORDER — ASPIRIN 81 MG PO CHEW
324.0000 mg | CHEWABLE_TABLET | ORAL | Status: AC
  Administered 2011-08-12: 324 mg via ORAL

## 2011-08-12 MED ORDER — PRASUGREL HCL 10 MG PO TABS
ORAL_TABLET | ORAL | Status: AC
  Administered 2011-08-13: 10 mg via ORAL
  Filled 2011-08-12: qty 6

## 2011-08-12 MED ORDER — ASPIRIN EC 81 MG PO TBEC
81.0000 mg | DELAYED_RELEASE_TABLET | Freq: Every day | ORAL | Status: DC
  Administered 2011-08-13: 81 mg via ORAL
  Filled 2011-08-12: qty 1

## 2011-08-12 MED ORDER — LIDOCAINE HCL (PF) 1 % IJ SOLN
INTRAMUSCULAR | Status: AC
  Filled 2011-08-12: qty 30

## 2011-08-12 MED ORDER — NITROGLYCERIN 0.4 MG SL SUBL: 0.4000 mg | SUBLINGUAL_TABLET | SUBLINGUAL | Status: DC | PRN

## 2011-08-12 MED ORDER — MIDAZOLAM HCL 2 MG/2ML IJ SOLN
INTRAMUSCULAR | Status: AC
  Filled 2011-08-12: qty 2

## 2011-08-12 MED ORDER — FENTANYL CITRATE 0.05 MG/ML IJ SOLN
INTRAMUSCULAR | Status: AC
  Filled 2011-08-12: qty 2

## 2011-08-12 MED ORDER — HEPARIN SODIUM (PORCINE) 1000 UNIT/ML IJ SOLN
INTRAMUSCULAR | Status: AC
  Filled 2011-08-12: qty 1

## 2011-08-12 MED ORDER — SODIUM CHLORIDE 0.9 % IV SOLN
1.0000 mL/kg/h | INTRAVENOUS | Status: DC
  Administered 2011-08-12: 1 mL/kg/h via INTRAVENOUS

## 2011-08-12 MED ORDER — NITROGLYCERIN 0.2 MG/ML ON CALL CATH LAB
INTRAVENOUS | Status: AC
  Filled 2011-08-12: qty 1

## 2011-08-12 NOTE — ED Provider Notes (Signed)
I saw and evaluated the patient, reviewed the resident's note and I agree with the findings and plan.  Typical angina, similar to previous MI. Relieved with nitro. No EKG changes.  Glynn Octave, MD 08/12/11 1246

## 2011-08-12 NOTE — Progress Notes (Signed)
ANTICOAGULATION CONSULT NOTE - Initial Consult  Pharmacy Consult for heparin Indication: chest pain/ACS  Allergies  Allergen Reactions  . Bacitracin   . Brimonidine Tartrate   . Lyrica (Pregabalin)   . Nabumetone     REACTION: Reaction not known  . Penicillins     REACTION: Reaction not known  . Spiriva (Tiotropium Bromide Monohydrate)   . Sulfonamide Derivatives     REACTION: Reaction not known  . Symbicort (Budesonide-Formoterol Fumarate)   . Adhesive (Tape) Rash and Other (See Comments)    Skin tear    Patient Measurements: Height: 5\' 11"  (180.3 cm) Weight: 214 lb (97.07 kg) IBW/kg (Calculated) : 75.3   Vital Signs: Temp: 97.6 F (36.4 C) (05/30 0300) Temp src: Oral (05/30 0300) BP: 125/82 mmHg (05/30 0300) Pulse Rate: 82  (05/30 0300)  Labs:  Basename 08/11/11 2116 08/11/11 2056  HGB 16.0 15.2  HCT 47.0 44.4  PLT -- 146*  APTT -- --  LABPROT -- --  INR -- --  HEPARINUNFRC -- --  CREATININE 1.30 --  CKTOTAL -- --  CKMB -- --  TROPONINI -- --    Estimated Creatinine Clearance: 60.1 ml/min (by C-G formula based on Cr of 1.3).   Medical History: Past Medical History  Diagnosis Date  . Back pain   . Leg pain   . Fatigue   . Myocardial infarct 03-17-2009    hx of   . COPD (chronic obstructive pulmonary disease)   . BPH (benign prostatic hypertrophy)   . Hyperlipidemia   . Glaucoma   . CLL (chronic lymphocytic leukemia)     hx of  . Erectile dysfunction   . Lumbar disc disease   . Hx of appendectomy   . Osteopenia   . AAA (abdominal aortic aneurysm)     measures 2.7 cm.  Marland Kitchen GERD (gastroesophageal reflux disease)   . Microscopic hematuria     Medications:  Prescriptions prior to admission  Medication Sig Dispense Refill  . albuterol (PROAIR HFA) 108 (90 BASE) MCG/ACT inhaler Inhale 2 puffs into the lungs every 6 (six) hours as needed. For shortness of breath      . aspirin 81 MG tablet Take 81 mg by mouth daily.       . bimatoprost (LUMIGAN)  0.03 % ophthalmic solution Place 1 drop into both eyes at bedtime.      . Fluticasone-Salmeterol (ADVAIR DISKUS) 250-50 MCG/DOSE AEPB Inhale 1 puff into the lungs every 12 (twelve) hours.      . gabapentin (NEURONTIN) 300 MG capsule Take 600 mg by mouth 2 (two) times daily.       . hydrOXYzine (ATARAX/VISTARIL) 25 MG tablet Take 25 mg by mouth every 8 (eight) hours as needed. For itching      . ibuprofen (ADVIL,MOTRIN) 200 MG tablet Take 400 mg by mouth 2 (two) times daily.       . metoprolol succinate (TOPROL-XL) 25 MG 24 hr tablet Take 25 mg by mouth daily.       . nitroGLYCERIN (NITROSTAT) 0.4 MG SL tablet Place 0.4 mg under the tongue every 5 (five) minutes as needed. For chest pain      . rosuvastatin (CRESTOR) 10 MG tablet Take 10 mg by mouth daily.       . Tamsulosin HCl (FLOMAX) 0.4 MG CAPS Take 0.4 mg by mouth daily.        . theophylline (THEO-24) 300 MG 24 hr capsule Take 300 mg by mouth daily. With food      .  timolol (BETIMOL) 0.5 % ophthalmic solution Place 1 drop into both eyes 2 (two) times daily.       Scheduled:    . aspirin  324 mg Oral Once  . aspirin EC  81 mg Oral Daily  . atorvastatin  40 mg Oral q1800  . bimatoprost  1 drop Both Eyes QHS  . Fluticasone-Salmeterol  1 puff Inhalation Q12H  . gabapentin  600 mg Oral BID  . heparin  4,000 Units Intravenous Once  . metoprolol succinate  25 mg Oral Daily  . nitroGLYCERIN  1 inch Topical Once  . Tamsulosin HCl  0.4 mg Oral Daily  . timolol  1 drop Both Eyes BID  . DISCONTD: aspirin  324 mg Oral NOW  . DISCONTD: aspirin  300 mg Rectal NOW  . DISCONTD: aspirin  325 mg Oral Once    Assessment: 76yo male with h/o NSTEMI 2010 and PCI 2011 c/o two episodes of CP radiating to jaw associated with SOB/diaphoresis, both relieved by SL NTG, initial CE negative, to begin heparin.   Goal of Therapy:  Heparin level 0.3-0.7 units/ml Monitor platelets by anticoagulation protocol: Yes   Plan:  Will give heparin bolus of 4000  units x1 followed by gtt at 1200 units/hr and monitor heparin levels and CBC.  Colleen Can PharmD BCPS 08/12/2011,3:09 AM

## 2011-08-12 NOTE — CV Procedure (Signed)
   Cardiac Catheterization Procedure Note  Name: NEFI MUSICH MRN: [id number] DOB: January 18, 1936  Procedure: Left Heart Cath, Selective Coronary Angiography, LV angiography  Indication: Unstable angina   Procedural Details: The right wrist was prepped, draped, and anesthetized with 1% lidocaine. Using the modified Seldinger technique, a 5 French sheath was introduced into the right radial artery. 2.5 mg of nicardipine was administered through the sheath, weight-based unfractionated heparin was administered intravenously. Standard Judkins catheters were used for selective coronary angiography and left ventriculography. Catheter exchanges were performed over an exchange length guidewire. There were no immediate procedural complications. A TR band was used for radial hemostasis at the completion of the procedure.  The patient was transferred to the post catheterization recovery area for further monitoring.  Procedural Findings: Hemodynamics: AO 106/65 LV 107/14  Coronary angiography: Coronary dominance: right  Left mainstem: Luminal irregularities  Left anterior descending (LAD): Luminal irregularities.   Left circumflex (LCx): The proximal LCx is hazy where OM1 and OM2 originate.  This is concerning for thrombus/significant disease.  OM1 is small with 80-90% proximal stenosis.   Right coronary artery (RCA): There are overlapping stents from the ostium of the RCA through the mid RCA.  There is up to 80% in-stent restenosis in the proximal RCA and 50% in-stent restenosis in the mid RCA.    Left ventriculography: Left ventricular systolic function is normal, LVEF is estimated at 55%, there is no significant mitral regurgitation   Final Conclusions:  I am concerned that the hazy lesion in the LCx could be the culprit for the patient's symptoms.  Plan PCI to proximal LCx.  Will also consider addressing the proximal RCA with cutting balloon. Discussed with Dr. Clifton James.   Marca Ancona 08/12/2011, 11:12 AM

## 2011-08-12 NOTE — Progress Notes (Signed)
TR BAND REMOVAL  LOCATION:    right radial  DEFLATED PER PROTOCOL:    yes  TIME BAND OFF / DRESSING APPLIED:    1530   SITE UPON ARRIVAL:    Level 0  SITE AFTER BAND REMOVAL:    Level 0  REVERSE ALLEN'S TEST:     positive  CIRCULATION SENSATION AND MOVEMENT:    Within Normal Limits   yes  COMMENTS:   Tolerated procedure well  

## 2011-08-12 NOTE — Care Management Note (Signed)
  Page 1 of 1   08/12/2011     10:33:10 AM   CARE MANAGEMENT NOTE 08/12/2011  Patient:  [name], [name]   Account Number:  [id number]  Date Initiated:  08/12/2011  Documentation initiated by:  GRAVES-BIGELOW,Floetta Brickey  Subjective/Objective Assessment:   Pt admitted with cp. Plan for cath today.     Action/Plan:   CM will continue to monitor after cath for additional d/c needs.   Anticipated DC Date:  08/13/2011   Anticipated DC Plan:  HOME/SELF CARE      DC Planning Services  CM consult      Choice offered to / List presented to:             Status of service:  In process, will continue to follow Medicare Important Message given?   (If response is "NO", the following Medicare IM given date fields will be blank) Date Medicare IM given:   Date Additional Medicare IM given:    Discharge Disposition:    Per UR Regulation:  Reviewed for med. necessity/level of care/duration of stay  If discussed at Long Length of Stay Meetings, dates discussed:    Comments:

## 2011-08-12 NOTE — ED Notes (Signed)
Note sent to pharmacy to tube patient's meds.

## 2011-08-12 NOTE — CV Procedure (Signed)
   Cardiac Catheterization Operative Report  [name] [id number] 5/30/201311:54 AM Georgann Housekeeper, MD, MD  Procedure Performed:  1. PTCA/DES x 1 proximal Circumflex 2. Cutting balloon angioplasty proximal RCA in area of severe in-stent restenosis.   Operator: Verne Carrow, MD  Indication:   Pt with known CAD. Presented with chest pain c/w unstable angina. Diagnostic cath this am per Dr. Shirlee Latch . Pt found to have severe stenosis proximal Circumflex and severe in stent restenosis in the proximal RCA.                                    Procedure Details: The risks, benefits, complications, treatment options, and expected outcomes were discussed with the patient before the diagnostic procedure. The patient and/or family concurred with the proposed plan, giving informed consent. When I entered the case, there was a 5 French sheath in place in the right radial artery. I upsized this to a 6 [place] system. He was then given a bolus of Angiomax and a drip was started. When the ACT was greater than 200, I engaged the left main with a XB 3.0 guiding catheter. A Cougar IC wire was advanced down the Circumflex artery. A 2.5 x 12 mm balloon was used to pre-dilate the proximal stenosis. I then deployed a 2.5 x 18 mm Resolute Integrity DES in the proximal Circumflex artery. This was post-dilated with a 2.75 x 15 mm [place] balloon. There was an excellent result. The stenosis was taken from 99% down to 0%. I then turned my attention to the RCA lesion.  I engaged the RCA with a 6 [place] JR4 guiding catheter with sideholes. I passed a Cougar IC wire down the RCA. The stenosis was within the stent. I used a 2.5 x 6 mm Cutting balloon x 2. I then used a 2.75 x 6 mm Cutting balloon x 2. There was an excellent result.  The stenosis was taken from 80% down to 10%.   There were no immediate complications. The sheath was removed from the right radial artery and a Terumo hemostasis band was applied at the arteriotomy  site. The patient was taken to the recovery area in stable condition.   Full diagnostic report in EPIC per Dr. Shirlee Latch.   Hemodynamic Findings: Central aortic pressure: 117/72  Impression: 1. Successful PTCA/DES x 1 proximal Circumflex 2. Successful cutting balloon angioplasty of in-stent restenosis in proximal RCA stent.   Recommendations: Continue ASA and Effient for one year. Continue other cardiac meds.        Complications:  None; patient tolerated the procedure well.

## 2011-08-12 NOTE — ED Notes (Signed)
Called and gave report to Brenna. 

## 2011-08-12 NOTE — Progress Notes (Signed)
UR Completed Treveon Bourcier Graves-Bigelow, RN,BSN [phone]  

## 2011-08-12 NOTE — H&P (Signed)
[name], Omar NO.:  [id number]  Omar RECORD NO.:  [id number]  LOCATION:  [facility]                        FACILITY:  [facility]  PHYSICIAN:  Natasha Bence, MD       DATE OF BIRTH:  January 23, 1936  DATE OF ADMISSION:  08/11/2011 DATE OF DISCHARGE:                             HISTORY & PHYSICAL   CHIEF COMPLAINT:  Chest discomfort.  HISTORY OF PRESENT ILLNESS:  [name] is a 76 year old white male with known coronary artery disease, status post PCI in 2011, who presents to the ED tonight with chest discomfort, which was very similar in nature to his previous angina.  He reports he was in church and then developed a substernal heaviness that radiated up into his neck.  He also had some diaphoresis and shortness of breath with this, initially improved with sublingual nitroglycerin.  At this point, he decided to come to the emergency room and in the car, he had another episode, which was relieved with nitroglycerin.  Since he has been in the ER, he had a little bit of chest discomfort, which was relieved with nitroglycerin patch.  Currently, he is pain free.  Prior to this, he has not had any chest discomfort and sometimes, he does have stable mild dyspnea on exertion.  Otherwise, no PND or orthopnea.  He denies any palpitations, lightheadedness, or dizziness.  He denies any bleeding or melena.  He has not had any fevers, chills, or sweats.  He does have chronic back pain, which is unchanged.  Otherwise complete review of systems was performed was negative except for what was stated above.  PAST Omar HISTORY: 1. Coronary artery disease.  He had an NSTEMI in 2010.  Otherwise, he     had drug-eluting stents in his right coronary artery in 2011. 2. Dyslipidemia. 3. CLL. 4. BPH. 5. COPD. 6. Mild aortic aneurysm around 3 cm. 7. GERD. 8. He has got lumbar spine disease.  FAMILY HISTORY:  Dad died of lung cancer.  Mom had dementia.  SOCIAL HISTORY:  He is a former  smoker, but quit in 2008.  He is married.  Denies any alcohol or illicit drug use.  ALLERGIES:  He is allergic to BACITRACIN, BRIMONIDINE TARTRATE, LYRICA, NABUMETONE, PENICILLIN, SPIRIVA, SULFA DERIVATIVES, and SYMBICORT.  HOME MEDICATIONS:  He is on 1. Albuterol inhaler 2 puffs q.6 hours p.r.n. 2. Aspirin 81 mg p.o. daily. 3. Lumigan eye drops at bedtime. 4. Advair Diskus 1 puff q.12 hours. 5. Neurontin 600 mg p.o. b.i.d. 6. Atarax, Vistaril 25 mg q.8 hours p.r.n. 7. Ibuprofen p.r.n. 8. Toprol-XL 25 mg p.o. daily. 9. Nitroglycerin p.r.n. 10.Crestor 10 mg p.o. daily. 11.Flomax 0.4 mg p.o. daily. 12.Theophylline 24 hour capsule 300 mg p.o. daily. 13.Timolol eye drops b.i.d.  PHYSICAL EXAMINATION:  VITAL SIGNS:  He is afebrile with temperature 98.5, pulse of 85 and regular, respiratory rate of 18, blood pressure 111/70, O2 sats 98% on room air. GENERAL:  He is a well-developed, well-nourished, white male, in no apparent distress. HEENT:  Eyes; he has anicteric sclerae.  Mucous membranes are moist. NECK:  Normal jugular venous pressure.  No carotid bruits. LUNGS:  Clear to auscultation bilaterally.  CARDIOVASCULAR:  Regular rate and rhythm.  No murmurs, rubs, or gallops. ABDOMEN:  Soft, nontender, and nondistended.  No masses or bruits. EXTREMITIES:  Warm with no edema.  He has symmetrical pulses throughout. NEUROLOGIC:  Grossly afocal. SKIN:  No rashes or ulcers.  LABORATORY DATA:  Sodium 141, potassium 4.9, chloride of 104, BUN of 28, creatinine is 1.3, glucose 110, bicarb 31, troponin 0.00.  BNP of 37. White blood cell count 11.2, hematocrit of 44, platelet count 146.  Chest x-ray shows no acute infiltrates or effusions.  Does shows chronic emphysematous changes.  EKG shows sinus mechanism, rate of 89 beats per minute.  Nonspecific ST-T changes.  IMPRESSION AND PLAN:  This is a 76 year old with known coronary artery disease, status post percutaneous coronary intervention  in 2011, to his right coronary artery, who presents with symptoms concerning for angina. He has fairly typical symptoms and is currently pain free.  Initial cardiac enzymes are negative and EKG is unrevealing.  We will admit him to telemetry with serial cardiac enzymes.  We will place him on heparin infusion as well as aspirin.  We will continue his beta-blocker and statin.  Depending on lab and clinical scenario points out through the night, we will make a decision on invasive versus noninvasive evaluation in the morning.  Of note, though he is on 24-hour theophylline, which I have held.  I also take an account he has severe emphysema if we decide to proceed with noninvasive evaluation.          ______________________________ Natasha Bence, MD     MH/MEDQ  D:  08/11/2011  T:  08/12/2011  Job:  [id number]

## 2011-08-12 NOTE — Progress Notes (Signed)
Patient ID: [name], male   DOB: Mar 26, 1935, 76 y.o.   MRN: [id number]    SUBJECTIVE: Slight chest cramping this morning but nothing like yesterday.  He had 2 episodes of severe chest cramping last night relieved by NTG.  First happened at church, second when driving to the ER.  Both lasted 5-10 minutes before he took NTG.  Felt very similar to prior ischemic pain.      Marland Kitchen aspirin  324 mg Oral Once  . aspirin EC  81 mg Oral Daily  . atorvastatin  40 mg Oral q1800  . bimatoprost  1 drop Both Eyes QHS  . gabapentin  600 mg Oral BID  . heparin  4,000 Units Intravenous Once  . metoprolol succinate  25 mg Oral Daily  . nitroGLYCERIN  1 inch Topical Once  . Tamsulosin HCl  0.4 mg Oral Daily  . timolol  1 drop Both Eyes BID  . DISCONTD: aspirin  324 mg Oral NOW  . DISCONTD: aspirin  300 mg Rectal NOW  . DISCONTD: aspirin  325 mg Oral Once  . DISCONTD: Fluticasone-Salmeterol  1 puff Inhalation Q12H  heparin gtt    Filed Vitals:   08/12/11 0200 08/12/11 0230 08/12/11 0300 08/12/11 0500  BP: 99/69 105/79 125/82 106/69  Pulse: 77 86 82 73  Temp:   97.6 F (36.4 C) 97.9 F (36.6 C)  TempSrc:   Oral Oral  Resp: 20 29 19 19   Height:   5\' 11"  (1.803 m)   Weight:   214 lb (97.07 kg)   SpO2: 95% 94% 94% 94%   No intake or output data in the 24 hours ending 08/12/11 0740  LABS: Basic Metabolic Panel:  Basename 08/12/11 0315 08/11/11 2116  NA 140 141  K 5.6* 4.9  CL 101 104  CO2 29 --  GLUCOSE 123* 110*  BUN 22 28*  CREATININE 1.09 1.30  CALCIUM 9.7 --  MG -- --  PHOS -- --   Liver Function Tests: No results found for this basename: AST:2,ALT:2,ALKPHOS:2,BILITOT:2,PROT:2,ALBUMIN:2 in the last 72 hours No results found for this basename: LIPASE:2,AMYLASE:2 in the last 72 hours CBC:  Basename 08/11/11 2116 08/11/11 2056  WBC -- 11.2*  NEUTROABS -- --  HGB 16.0 15.2  HCT 47.0 44.4  MCV -- 92.5  PLT -- 146*   Cardiac Enzymes:  Basename 08/12/11 0315  CKTOTAL 273*    CKMB 4.8*  CKMBINDEX --  TROPONINI <0.30   BNP: No components found with this basename: POCBNP:3 D-Dimer: No results found for this basename: DDIMER:2 in the last 72 hours Hemoglobin A1C: No results found for this basename: HGBA1C in the last 72 hours Fasting Lipid Panel:  Basename 08/12/11 0315  CHOL 128  HDL 67  LDLCALC 44  TRIG 83  CHOLHDL 1.9  LDLDIRECT --   Thyroid Function Tests: No results found for this basename: TSH,T4TOTAL,FREET3,T3FREE,THYROIDAB in the last 72 hours Anemia Panel: No results found for this basename: VITAMINB12,FOLATE,FERRITIN,TIBC,IRON,RETICCTPCT in the last 72 hours  RADIOLOGY: Dg Chest 2 View  08/11/2011  *RADIOLOGY REPORT*  Clinical Data: Chest pain.  CHEST - 2 VIEW  Comparison: Two-view chest 07/13/2011.  Findings: The heart is mildly enlarged.  There is slight increase in a diffuse interstitial pattern, suggesting mild edema superimposed on chronic change.  Emphysematous changes are again noted.  No focal airspace consolidation is evident.  Degenerative changes of the thoracic spine are stable.  IMPRESSION:  1.  Cardiomegaly and mild edema suggesting early congestive heart failure.  2.  No focal airspace disease. 3.  Emphysema.  Original Report Authenticated By: Jamesetta Orleans. MATTERN, M.D.    PHYSICAL EXAM General: NAD Neck: No JVD, no thyromegaly or thyroid nodule.  Lungs: Distant BS bilaterally CV: Nondisplaced PMI.  Heart regular S1/S2, no S3/S4, no murmur.  No peripheral edema.  No carotid bruit.  Normal pedal pulses.  Abdomen: Soft, nontender, no hepatosplenomegaly, no distention.  Neurologic: Alert and oriented x 3.  Psych: Normal affect. Extremities: No clubbing or cyanosis.   TELEMETRY: Reviewed telemetry pt in NSR  ASSESSMENT AND PLAN:  76 yo with history of COPD and CAD presented with chest pain reminiscent of prior ischemic pain.  The pain was relieved by NTG.  Had 2 episodes, both lasting 5-10 minutes.  1. CAD: Pain concerning  for unstable angina.  Cardiac enzymes negative.  Continue current meds and plan LHC today.  2. COPD: Stable, no wheezing.   3. Hyperkalemia: Has chronically run a high normal to mildly elevated K, he is not on an ACEI.   Marca Ancona 08/12/2011 7:44 AM

## 2011-08-13 ENCOUNTER — Telehealth: Payer: Self-pay | Admitting: Internal Medicine

## 2011-08-13 ENCOUNTER — Ambulatory Visit: Payer: Medicare Other

## 2011-08-13 DIAGNOSIS — I2510 Atherosclerotic heart disease of native coronary artery without angina pectoris: Secondary | ICD-10-CM

## 2011-08-13 DIAGNOSIS — I249 Acute ischemic heart disease, unspecified: Secondary | ICD-10-CM

## 2011-08-13 LAB — CBC
HCT: 40.8 % (ref 39.0–52.0)
Hemoglobin: 14.1 g/dL (ref 13.0–17.0)
MCH: 31.5 pg (ref 26.0–34.0)
MCHC: 34.6 g/dL (ref 30.0–36.0)
MCV: 91.1 fL (ref 78.0–100.0)
Platelets: 120 10*3/uL — ABNORMAL LOW (ref 150–400)
RBC: 4.48 MIL/uL (ref 4.22–5.81)
RDW: 13.5 % (ref 11.5–15.5)
WBC: 8.7 10*3/uL (ref 4.0–10.5)

## 2011-08-13 LAB — BASIC METABOLIC PANEL
BUN: 15 mg/dL (ref 6–23)
CO2: 26 mEq/L (ref 19–32)
Calcium: 9.1 mg/dL (ref 8.4–10.5)
Chloride: 105 mEq/L (ref 96–112)
Creatinine, Ser: 0.91 mg/dL (ref 0.50–1.35)
GFR calc Af Amer: >90 mL/min (ref >90)
GFR calc non Af Amer: 82 mL/min — ABNORMAL LOW (ref >90)
Glucose, Bld: 127 mg/dL — ABNORMAL HIGH (ref 70–99)
Potassium: 3.8 mEq/L (ref 3.5–5.1)
Sodium: 140 mEq/L (ref 135–145)

## 2011-08-13 MED ORDER — PRASUGREL HCL 10 MG PO TABS: 10.0000 mg | ORAL_TABLET | Freq: Every day | ORAL | Status: DC

## 2011-08-13 MED ORDER — ALUM & MAG HYDROXIDE-SIMETH 200-200-20 MG/5ML PO SUSP
30.0000 mL | ORAL | Status: DC | PRN
  Administered 2011-08-13: 30 mL via ORAL

## 2011-08-13 MED FILL — Dextrose Inj 5%: INTRAVENOUS | Qty: 50 | Status: AC

## 2011-08-13 NOTE — Progress Notes (Signed)
When sleeping, O2 sat occas drops as low as 84% on RA.  Snoring loudly, lying on back.  When awakened or turned onto left side, O2 sat quickly rises to 93%.

## 2011-08-13 NOTE — Progress Notes (Signed)
Patient ID: [name], male   DOB: [birth year], 76 y.o.   MRN: [id number]     SUBJECTIVE: Cath yesterday, DES to prox LCx and cutting balloon to in-stent restenosis in RCA.  Doing well today, no recurrent chest pain, walking without problems.  Noted to desaturate significantly at night suggestive of sleep apnea.       Marland Kitchen aspirin      . aspirin  324 mg Oral Pre-Cath  . aspirin EC  81 mg Oral Daily  . atorvastatin  40 mg Oral q1800  . bimatoprost  1 drop Both Eyes QHS  . bivalirudin      . fentaNYL      . gabapentin  600 mg Oral BID  . heparin      . heparin      . lidocaine      . metoprolol succinate  25 mg Oral Daily  . midazolam      . nitroGLYCERIN      . prasugrel      . prasugrel  10 mg Oral Daily  . Tamsulosin HCl  0.4 mg Oral Daily  . timolol  1 drop Both Eyes BID  . DISCONTD: aspirin  324 mg Oral Pre-Cath  . DISCONTD: bivalirudin (ANGIOMAX) infusion 5 mg/mL (Cath Lab,ACS,PCI indication)  0.25 mg/kg/hr Intravenous To Cath  . DISCONTD: sodium chloride  3 mL Intravenous Q12H     Filed Vitals:   08/12/11 2008 08/13/11 0022 08/13/11 0454 08/13/11 0726  BP: 140/74 125/77 123/73 136/89  Pulse: 76 72 74 79  Temp: 98.2 F (36.8 C) 98 F (36.7 C) 97.3 F (36.3 C) 97.6 F (36.4 C)  TempSrc: Oral Oral Oral Oral  Resp: 18 16 19 20   Height:      Weight:  95.8 kg (211 lb 3.2 oz)    SpO2: 95% 97% 94% 95%    Intake/Output Summary (Last 24 hours) at 08/13/11 0746 Last data filed at 08/13/11 0300  Gross per 24 hour  Intake 1057.5 ml  Output   2101 ml  Net -1043.5 ml    LABS: Basic Metabolic Panel:  Basename 08/13/11 0432 08/12/11 0315  NA 140 140  K 3.8 5.6*  CL 105 101  CO2 26 29  GLUCOSE 127* 123*  BUN 15 22  CREATININE 0.91 1.09  CALCIUM 9.1 9.7  MG -- --  PHOS -- --   Liver Function Tests: No results found for this basename: AST:2,ALT:2,ALKPHOS:2,BILITOT:2,PROT:2,ALBUMIN:2 in the last 72 hours No results found for this basename: LIPASE:2,AMYLASE:2  in the last 72 hours CBC:  Basename 08/13/11 0432 08/11/11 2116 08/11/11 2056  WBC 8.7 -- 11.2*  NEUTROABS -- -- --  HGB 14.1 16.0 --  HCT 40.8 47.0 --  MCV 91.1 -- 92.5  PLT 120* -- 146*   Cardiac Enzymes:  Basename 08/12/11 0945 08/12/11 0315  CKTOTAL 191 273*  CKMB 4.4* 4.8*  CKMBINDEX -- --  TROPONINI <0.30 <0.30   BNP: No components found with this basename: POCBNP:3 D-Dimer: No results found for this basename: DDIMER:2 in the last 72 hours Hemoglobin A1C: No results found for this basename: HGBA1C in the last 72 hours Fasting Lipid Panel:  Basename 08/12/11 0315  CHOL 128  HDL 67  LDLCALC 44  TRIG 83  CHOLHDL 1.9  LDLDIRECT --   Thyroid Function Tests: No results found for this basename: TSH,T4TOTAL,FREET3,T3FREE,THYROIDAB in the last 72 hours Anemia Panel: No results found for this basename: VITAMINB12,FOLATE,FERRITIN,TIBC,IRON,RETICCTPCT in the last 72 hours  RADIOLOGY: Dg Chest  2 View  08/11/2011  *RADIOLOGY REPORT*  Clinical Data: Chest pain.  CHEST - 2 VIEW  Comparison: Two-view chest 07/13/2011.  Findings: The heart is mildly enlarged.  There is slight increase in a diffuse interstitial pattern, suggesting mild edema superimposed on chronic change.  Emphysematous changes are again noted.  No focal airspace consolidation is evident.  Degenerative changes of the thoracic spine are stable.  IMPRESSION:  1.  Cardiomegaly and mild edema suggesting early congestive heart failure. 2.  No focal airspace disease. 3.  Emphysema.  Original Report Authenticated By: Jamesetta Orleans. MATTERN, M.D.    PHYSICAL EXAM General: NAD Neck: No JVD, no thyromegaly or thyroid nodule.  Lungs: Distant BS bilaterally CV: Nondisplaced PMI.  Heart regular S1/S2, no S3/S4, no murmur.  No peripheral edema.  No carotid bruit.  Normal pedal pulses.  Abdomen: Soft, nontender, no hepatosplenomegaly, no distention.  Neurologic: Alert and oriented x 3.  Psych: Normal affect. Extremities: No  clubbing or cyanosis.  Right radial cath site benign.   TELEMETRY: Reviewed telemetry pt in NSR  ASSESSMENT AND PLAN:  76 yo with history of COPD and CAD presented with unstable angina.  1. CAD: s/p DES to LCx and cutting balloon to RCA instent restenosis.  Needs to be on Effient for at least a year.   I will not start an ACEI due to history of hyperkalemia.  2. COPD: Stable, continue home meds.  3. OSA: Strong suspicion.  Will need to set up for outpatient sleep study.  4. Disposition: Home today.  Followup with me in 2 wks.  Cardiac rehab needs to be arranged at [facility].  Home cardiac meds: ASA 81, Effient 10, Toprol XL 25, atorvastatin 40.  Needs sleep study to be arranged with [facility].   Marca Ancona 08/13/2011 7:46 AM

## 2011-08-13 NOTE — Discharge Summary (Signed)
CARDIOLOGY DISCHARGE SUMMARY   Patient ID: MIZAEL SAGAR MRN: [id number] DOB/AGE: 1935-07-26 76 y.o.  Admit date: 08/11/2011 Discharge date: 08/13/2011  Primary Discharge Diagnosis:  ACS  Secondary Discharge Diagnosis:  Past Medical History  Diagnosis Date  . Back pain   . Leg pain   . Fatigue   . Myocardial infarct 03-17-2009    hx of   . COPD (chronic obstructive pulmonary disease)   . BPH (benign prostatic hypertrophy)   . Hyperlipidemia   . Glaucoma   . CLL (chronic lymphocytic leukemia)     hx of  . Erectile dysfunction   . Lumbar disc disease   . Hx of appendectomy   . Osteopenia   . AAA (abdominal aortic aneurysm)     measures 2.7 cm.  Marland Kitchen GERD (gastroesophageal reflux disease)   . Microscopic hematuria     Procedures: Left Heart Cath, Selective Coronary Angiography, LV angiography, 2.5 x 18 mm Resolute Integrity DES in the proximal Circumflex artery, Cutting balloon angioplasty proximal RCA in area of severe in-stent restenosis  Hospital Course: Mr Braithwaite is a 76 year old male with a history of CAD. He had chest pain and came to the hospital where he was admitted for further evaluation and treatment.   He had some minimal elevation in his CKMBs but hisTroponins were negative. He was taken to the cath lab on 5/30 with the results described below. He had interventions on 2 vessels that were both successful. He tolerated the procedures well. He was seen by cardiac rehab and ambulated without chest pain or SOB. His lipid profile was reviewed and his HDL was 67 with an LDL of 44 so no dose change was made. He is to continue a statin.  On 5/31, he was seen by Dr Shirlee Latch. He is on a BB and a statin and Effient for 1 year is recommended. He is not on an ACE because of a history of hyperkalemia. He was ambulating well and considered stable for discharge, in improved condition, to follow up as an outpatient.  Labs:   Lab Results  Component Value Date   WBC 8.7 08/13/2011   HGB  14.1 08/13/2011   HCT 40.8 08/13/2011   MCV 91.1 08/13/2011   PLT 120* 08/13/2011    Lab 08/13/11 0432  NA 140  K 3.8  CL 105  CO2 26  BUN 15  CREATININE 0.91  CALCIUM 9.1  PROT --  BILITOT --  ALKPHOS --  ALT --  AST --  GLUCOSE 127*    Basename 08/12/11 0945 08/12/11 0315  CKTOTAL 191 273*  CKMB 4.4* 4.8*  CKMBINDEX -- --  TROPONINI <0.30 <0.30   Lipid Panel     Component Value Date/Time   CHOL 128 08/12/2011 0315   TRIG 83 08/12/2011 0315   HDL 67 08/12/2011 0315   CHOLHDL 1.9 08/12/2011 0315   VLDL 17 08/12/2011 0315   LDLCALC 44 08/12/2011 0315    Pro B Natriuretic peptide (BNP)  Date/Time Value Range Status  08/11/2011  9:28 PM 37.1  0-125 (pg/mL) Final  01/16/2008  1:35 PM <30.0   Final     Radiology: Dg Chest 2 View 08/11/2011  *RADIOLOGY REPORT*  Clinical Data: Chest pain.  CHEST - 2 VIEW  Comparison: Two-view chest 07/13/2011.  Findings: The heart is mildly enlarged.  There is slight increase in a diffuse interstitial pattern, suggesting mild edema superimposed on chronic change.  Emphysematous changes are again noted.  No focal airspace consolidation  is evident.  Degenerative changes of the thoracic spine are stable.  IMPRESSION:  1.  Cardiomegaly and mild edema suggesting early congestive heart failure. 2.  No focal airspace disease. 3.  Emphysema.  Original Report Authenticated By: Jamesetta Orleans. MATTERN, M.D.    Cardiac Cath:  Left mainstem: Luminal irregularities  Left anterior descending (LAD): Luminal irregularities.  Left circumflex (LCx): The proximal LCx is hazy where OM1 and OM2 originate. This is concerning for thrombus/significant disease. OM1 is small with 80-90% proximal stenosis.  Right coronary artery (RCA): There are overlapping stents from the ostium of the RCA through the mid RCA. There is up to 80% in-stent restenosis in the proximal RCA and 50% in-stent restenosis in the mid RCA.  Left ventriculography: Left ventricular systolic function is  normal, LVEF is estimated at 55%, there is no significant mitral regurgitation  A 2.5 x 12 mm balloon was used to pre-dilate the proximal stenosis. I then deployed a 2.5 x 18 mm Resolute Integrity DES in the proximal Circumflex artery. This was post-dilated with a 2.75 x 15 mm [place] balloon. There was an excellent result. The stenosis was taken from 99% down to 0%. I then turned my attention to the RCA lesion. I engaged the RCA with a 6 [place] JR4 guiding catheter with sideholes. I passed a Cougar IC wire down the RCA. The stenosis was within the stent. I used a 2.5 x 6 mm Cutting balloon x 2. I then used a 2.75 x 6 mm Cutting balloon x 2. There was an excellent result. The stenosis was taken from 80% down to 10%.   EKG: 13-Aug-2011 05:00:55  Sinus rhythm with occasional Premature ventricular complexes Nonspecific T wave abnormality Abnormal ECG 58mm/s 57mm/mV 100Hz  8.0.1 12SL 239 CID: 1 Referred by: THOMAS C WALL Unconfirmed Vent. rate 71 BPM PR interval 158 ms QRS duration 86 ms QT/QTc 398/432 ms P-R-T axes 55 56 59   FOLLOW UP PLANS AND APPOINTMENTS Allergies  Allergen Reactions  . Bacitracin   . Brimonidine Tartrate   . Lyrica (Pregabalin)   . Nabumetone     REACTION: Reaction not known  . Penicillins     REACTION: Reaction not known  . Spiriva (Tiotropium Bromide Monohydrate)   . Sulfonamide Derivatives     REACTION: Reaction not known  . Symbicort (Budesonide-Formoterol Fumarate)   . Adhesive (Tape) Rash and Other (See Comments)    Skin tear   Medication List  As of 08/13/2011 10:37 AM   STOP taking these medications         ibuprofen 200 MG tablet         TAKE these medications         ADVAIR DISKUS 250-50 MCG/DOSE Aepb   Generic drug: Fluticasone-Salmeterol   Inhale 1 puff into the lungs every 12 (twelve) hours.      aspirin 81 MG tablet   Take 81 mg by mouth daily.      bimatoprost 0.03 % ophthalmic solution   Commonly known as: LUMIGAN   Place 1 drop into both  eyes at bedtime.      FLOMAX 0.4 MG Caps   Generic drug: Tamsulosin HCl   Take 0.4 mg by mouth daily.      gabapentin 300 MG capsule   Commonly known as: NEURONTIN   Take 600 mg by mouth 2 (two) times daily.      hydrOXYzine 25 MG tablet   Commonly known as: ATARAX/VISTARIL   Take 25 mg by mouth every 8 (  eight) hours as needed. For itching      metoprolol succinate 25 MG 24 hr tablet   Commonly known as: TOPROL-XL   Take 25 mg by mouth daily.      nitroGLYCERIN 0.4 MG SL tablet   Commonly known as: NITROSTAT   Place 0.4 mg under the tongue every 5 (five) minutes as needed. For chest pain      prasugrel 10 MG Tabs   Commonly known as: EFFIENT   Take 1 tablet (10 mg total) by mouth daily.      PROAIR HFA 108 (90 BASE) MCG/ACT inhaler   Generic drug: albuterol   Inhale 2 puffs into the lungs every 6 (six) hours as needed. For shortness of breath      rosuvastatin 10 MG tablet   Commonly known as: CRESTOR   Take 10 mg by mouth daily.      theophylline 300 MG 24 hr capsule   Commonly known as: THEO-24   Take 300 mg by mouth daily. With food      timolol 0.5 % ophthalmic solution   Commonly known as: BETIMOL   Place 1 drop into both eyes 2 (two) times daily.           Follow-up Information    Follow up with Tereso Newcomer, PA. (See for Dr Shirlee Latch on June 10th at 11:10 am)    Contact information:   [address] [place] [address] [phone]       Follow up with Waymon Budge, MD.   Contact information:   [address] [employer] [place] [address] [phone]          Discharge Orders    Future Appointments: Provider: Department: Dept Phone: Center:   09/10/2011 2:45 PM Waymon Budge, MD [facility] [phone] None   09/14/2011 3:45 PM Waymon Budge, MD [facility] [phone] None     Future Orders Please Complete By Expires   Amb Referral to Cardiac  Rehabilitation      Comments:   Referring to [facility] Phase 2   Diet - low sodium heart healthy      Increase activity slowly      Call MD for:  temperature >100.4      Call MD for:  severe uncontrolled pain      Call MD for:  redness, tenderness, or signs of infection (pain, swelling, redness, odor or green/yellow discharge around incision site)          BRING ALL MEDICATIONS WITH YOU TO FOLLOW UP APPOINTMENTS  Time spent with patient to include physician time: 38 min Signed: Theodore Demark 08/13/2011, 10:30 AM Co-Sign MD

## 2011-08-13 NOTE — Discharge Instructions (Addendum)
Angiogram, Angioplasty, or Stent Placement Radial Site Care Refer to this sheet in the next few weeks. These instructions provide you with information on caring for yourself after your procedure. Your caregiver may also give you more specific instructions. Your treatment has been planned according to current medical practices, but problems sometimes occur. Call your caregiver if you have any problems or questions after your procedure. HOME CARE INSTRUCTIONS You may shower the day after the procedure.Remove the bandage (dressing) and gently wash the site with plain soap and water.Gently pat the site dry.  Do not apply powder or lotion to the site.  Do not submerge the affected site in water for 3 to 5 days.  Inspect the site at least twice daily.  Do not flex or bend the affected arm for 24 hours.  No lifting over 5 pounds (2.3 kg) for 5 days after your procedure.  Do not drive home if you are discharged the same day of the procedure. Have someone else drive you.  You may drive 24 hours after the procedure unless otherwise instructed by your caregiver.  Do not operate machinery or power tools for 24 hours.  A responsible adult should be with you for the first 24 hours after you arrive home.  What to expect: Any bruising will usually fade within 1 to 2 weeks.  Blood that collects in the tissue (hematoma) may be painful to the touch. It should usually decrease in size and tenderness within 1 to 2 weeks.  SEEK IMMEDIATE MEDICAL CARE IF: You have unusual pain at the radial site.  You have redness, warmth, swelling, or pain at the radial site.  You have drainage (other than a small amount of blood on the dressing).  You have chills.  You have a fever or persistent symptoms for more than 72 hours.  You have a fever and your symptoms suddenly get worse.  Your arm becomes pale, cool, tingly, or numb.  You have heavy bleeding from the site. Hold pressure on the site.  Document Released: 04/03/2010  Document Revised: 02/18/2011 Document Reviewed: 04/03/2010 [facility] Patient Information 2012 [place], [place].Care After One of the following procedures was done today. ANGIOGRAM: A catheter was placed through the blood vessel in your groin, contrast was injected into the vessels, and pictures were taken. ANGIOPLASTY: A catheter was placed through the blood vessel in your groin and directed to an area of blocked blood flow. A balloon, and possibly a metal stent were used to open the blockage. If no other blockages are present below this area, your symptoms should improve. If blockages are present below this area, surgery may still be necessary. STENT: A catheter was placed in your groin through which a metal mesh tube was placed in a narrowed part of the artery to facilitate blood flow. You were given intravenous sedation. These medications are rapidly cleared from your bloodstream. You may feel some discomfort at the insertion site after the local anesthetic wears off. This discomfort should gradually improve over the next several days.  Only take over-the-counter or prescription medicines for pain, discomfort, or fever as directed by your caregiver.   Complications are very uncommon after this procedure. Go to the nearest emergency department if you develop any of the following symptoms:   Worsening pain.   Bleeding.   Swelling at the puncture site.   Lightheadedness.   Dizziness or fainting.   Fever or chills.   If oozing, bleeding, or a lump appears at the puncture site, apply firm  pressure directly to the site steadily for 15 minutes and go to the emergency department.   Keep the skin around the insertion site dry. You may take showers after 24 hours. If the area does get wet, dry the skin completely. Avoid baths until the skin puncture site heals, usually 5 to 7 days.   Development of redness, increased soreness, or swelling may be signs of a skin infection. Contact your  physician.   Rest for the remainder of the day and avoid any heavy lifting (more than 10 pounds or 4.5 kg). Do not operate heavy machinery, drive, or make legal decisions for the first 24 hours after the procedure. Have a responsible person drive you home.   You may resume your usual diet after the procedure. Avoid alcoholic beverages for 24 hours after the procedure.  Document Released: 03/01/2005 Document Revised: 02/18/2011 Document Reviewed: 12/29/2005 Prasugrel oral tablets What is this medicine? PRASUGREL (PRA soo grel) helps to prevent blood clots. This medicine is used to prevent heart attack, stroke, or other vascular events in people who are at high risk. This medicine may be used for other purposes; ask your health care provider or pharmacist if you have questions. What should I tell my health care provider before I take this medicine? They need to know if you have any of these conditions: -bleeding disorder -liver disease -recent trauma or surgery -stomach or intestinal ulcers -stroke or transient ischemic attack -an unusual or allergic reaction to prasugrel, other medicines, foods, dyes, or preservatives -pregnant or trying to get pregnant -breast-feeding How should I use this medicine? Take this medicine by mouth with a drink of water. Follow the directions on the prescription label. You may take this medicine with or without food. If it upsets your stomach, take it with food. This medicine may be chewed or it may be crushed and put into food or liquids such as applesauce, juice, or water as long as it is taken immediately. This medicine has a bitter taste that you may notice if it is chewed or crushed. Take your medicine at regular intervals. Do not take your medicine more often than directed. Do not stop taking except on your doctor's advice. Talk to your pediatrician regarding the use of this medicine in children. Special care may be needed. Overdosage: If you think you have  taken too much of this medicine contact a poison control center or emergency room at once. NOTE: This medicine is only for you. Do not share this medicine with others. What if I miss a dose? If you miss a dose, take it as soon as you can. If it is almost time for your next dose, take only that dose. Do not take double or extra doses. What may interact with this medicine? -aspirin -certain medicines that treat or prevent blood clots like warfarin, enoxaparin, and dalteparin -NSAIDS, medicines for pain and inflammation, like ibuprofen or naproxen This list may not describe all possible interactions. Give your health care provider a list of all the medicines, herbs, non-prescription drugs, or dietary supplements you use. Also tell them if you smoke, drink alcohol, or use illegal drugs. Some items may interact with your medicine. What should I watch for while using this medicine? Visit your doctor or health care professional for regular check ups. Do not stop taking your medicine unless your doctor tells you to. If you are going to have surgery or dental work, tell your doctor or health care professional that you are taking this medicine.  What side effects may I notice from receiving this medicine? Side effects that you should report to your doctor or health care professional as soon as possible: -allergic reactions like skin rash, itching or hives, swelling of the face, lips, or tongue -black, tarry stools -blood in urine or vomit -breathing problems -changes in vision -confusion, trouble speaking or understanding -fever or chills -sudden weakness -unusual bleeding or bruising Side effects that usually do not require medical attention (report to your doctor or health care professional if they continue or are bothersome): -diarrhea -headache -nausea, vomiting -pain in back, arms or legs This list may not describe all possible side effects. Call your doctor for medical advice about side  effects. You may report side effects to FDA at 1-800-FDA-1088. Where should I keep my medicine? Keep out of the reach of children. Store at room temperature between 15 and 30 degrees C (59 and 86 degrees F). Throw away any unused medicine after the expiration date. NOTE: This sheet is a summary. It may not cover all possible information. If you have questions about this medicine, talk to your doctor, pharmacist, or health care provider.  2012, [employer] Standard. (06/26/2008 2:25:58 PM)Coronary Angiography with Stent This is a procedure to widen or open a narrow blood vessel of the heart (coronary artery). When a coronary artery becomes partially blocked it decreases blood flow to that area. This may lead to chest pain or a heart attack (myocardial infarction). Arteries may become blocked by cholesterol buildup (plaque) in the lining or wall. A stent is a small piece of metal that looks like a mesh or a spring. Stent placement may be done right after an angiogram that finds a blocked artery or as a treatment for a heart attack. RISKS AND COMPLICATIONS  Damage to the heart.   A blockage may return.   Bleeding at the site.   Blood clot to another part of the body.  PROCEDURE  You may be given a medication to help you relax before and during the procedure through an IV in your hand or arm.   A local anesthetic to make the area numb may be used before inserting the catheter (a long, hollow tube about the size of a piece of cooked spaghetti).   You will be prepared for the procedure by washing and shaving the area where the catheter will be inserted. This is usually done in the groin.   A specially trained doctor will insert the catheter with a guide wire into an artery. This is guided under a special type of X-ray (fluoroscopy) to the opening of the blocked artery.   Special dye is then injected and X-rays are taken.   A tiny wire is guided to the blocked spot and a balloon is inflated  to make the artery wider. The stent is expanded and crushes the plaque into the wall of the vessel. The stent holds the area open like a scaffolding and improves the blood flow.   Sometimes the artery may be made wider using a laser or other tools to remove plaque.   When the blood flow is better, the catheter is removed. The lining of the artery will grow over the stent which stays where it was placed.  AFTER THE PROCEDURE  You will stay in bed for several hours.   The access site will be watched and you will be checked frequently.   Blood tests, X-rays and an EKG may be done.   You may stay in the  hospital overnight for observation.  SEEK IMMEDIATE MEDICAL CARE IF:   You develop chest pain, shortness of breath, feel faint, or pass out.   There is bleeding, swelling, or drainage from the catheter insertion site.   You develop pain, discoloration, coldness, or severe bruising in the leg or arm that held the catheter.   You see blood in your urine or stool. This may be bright red blood in urine or stools, or also appear as black, tarry stools.   You have a fever.  Document Released: 09/05/2002 Document Revised: 02/18/2011 Document Reviewed: 04/28/2007 [facility] Patient Information 2012 [place], [place].

## 2011-08-13 NOTE — Telephone Encounter (Signed)
Pt states that he was in the hospital and had labs done and wanted to let Dr. Maple Hudson know in case he wanted to review them before his upcoming appt. I will forward as an fyi.[name], CMA

## 2011-08-13 NOTE — Progress Notes (Signed)
[id number] Education completed with pt and wife. Permission given to refer to [facility] Phase 2. Pt had been observed walking in hall without chest pain so I did not walk. Mansel Strother DunlapRN

## 2011-08-17 MED FILL — Nicardipine HCl IV Soln 2.5 MG/ML: INTRAVENOUS | Qty: 1 | Status: AC

## 2011-08-21 ENCOUNTER — Telehealth: Payer: Self-pay | Admitting: Physician Assistant

## 2011-08-21 ENCOUNTER — Other Ambulatory Visit: Payer: Self-pay

## 2011-08-21 ENCOUNTER — Emergency Department (HOSPITAL_COMMUNITY)
Admission: EM | Admit: 2011-08-21 | Discharge: 2011-08-21 | Disposition: A | Discharge status: Home / Self Care | Payer: Medicare Other | Attending: Emergency Medicine | Admitting: Emergency Medicine

## 2011-08-21 ENCOUNTER — Emergency Department (HOSPITAL_COMMUNITY): Payer: Medicare Other

## 2011-08-21 ENCOUNTER — Encounter (HOSPITAL_COMMUNITY): Payer: Self-pay | Admitting: Emergency Medicine

## 2011-08-21 DIAGNOSIS — N400 Enlarged prostate without lower urinary tract symptoms: Secondary | ICD-10-CM | POA: Insufficient documentation

## 2011-08-21 DIAGNOSIS — I714 Abdominal aortic aneurysm, without rupture, unspecified: Secondary | ICD-10-CM | POA: Insufficient documentation

## 2011-08-21 DIAGNOSIS — Z79899 Other long term (current) drug therapy: Secondary | ICD-10-CM | POA: Insufficient documentation

## 2011-08-21 DIAGNOSIS — M899 Disorder of bone, unspecified: Secondary | ICD-10-CM | POA: Insufficient documentation

## 2011-08-21 DIAGNOSIS — M949 Disorder of cartilage, unspecified: Secondary | ICD-10-CM | POA: Insufficient documentation

## 2011-08-21 DIAGNOSIS — Z856 Personal history of leukemia: Secondary | ICD-10-CM | POA: Insufficient documentation

## 2011-08-21 DIAGNOSIS — IMO0002 Reserved for concepts with insufficient information to code with codable children: Secondary | ICD-10-CM | POA: Insufficient documentation

## 2011-08-21 DIAGNOSIS — M542 Cervicalgia: Secondary | ICD-10-CM

## 2011-08-21 DIAGNOSIS — E785 Hyperlipidemia, unspecified: Secondary | ICD-10-CM | POA: Insufficient documentation

## 2011-08-21 DIAGNOSIS — I252 Old myocardial infarction: Secondary | ICD-10-CM | POA: Insufficient documentation

## 2011-08-21 DIAGNOSIS — J449 Chronic obstructive pulmonary disease, unspecified: Secondary | ICD-10-CM | POA: Insufficient documentation

## 2011-08-21 DIAGNOSIS — H409 Unspecified glaucoma: Secondary | ICD-10-CM | POA: Insufficient documentation

## 2011-08-21 DIAGNOSIS — J4489 Other specified chronic obstructive pulmonary disease: Secondary | ICD-10-CM | POA: Insufficient documentation

## 2011-08-21 DIAGNOSIS — Z7982 Long term (current) use of aspirin: Secondary | ICD-10-CM | POA: Insufficient documentation

## 2011-08-21 DIAGNOSIS — K219 Gastro-esophageal reflux disease without esophagitis: Secondary | ICD-10-CM | POA: Insufficient documentation

## 2011-08-21 LAB — CBC
HCT: 43.2 % (ref 39.0–52.0)
Hemoglobin: 15 g/dL (ref 13.0–17.0)
MCH: 31.4 pg (ref 26.0–34.0)
MCHC: 34.7 g/dL (ref 30.0–36.0)
MCV: 90.6 fL (ref 78.0–100.0)
Platelets: 186 10*3/uL (ref 150–400)
RBC: 4.77 MIL/uL (ref 4.22–5.81)
RDW: 13.5 % (ref 11.5–15.5)
WBC: 10.9 10*3/uL — ABNORMAL HIGH (ref 4.0–10.5)

## 2011-08-21 LAB — DIFFERENTIAL
Basophils Absolute: 0 10*3/uL (ref 0.0–0.1)
Basophils Relative: 0 % (ref 0–1)
Eosinophils Absolute: 0.3 10*3/uL (ref 0.0–0.7)
Eosinophils Relative: 3 % (ref 0–5)
Lymphocytes Relative: 35 % (ref 12–46)
Lymphs Abs: 3.8 10*3/uL (ref 0.7–4.0)
Monocytes Absolute: 0.6 10*3/uL (ref 0.1–1.0)
Monocytes Relative: 5 % (ref 3–12)
Neutro Abs: 6.2 10*3/uL (ref 1.7–7.7)
Neutrophils Relative %: 57 % (ref 43–77)

## 2011-08-21 LAB — COMPREHENSIVE METABOLIC PANEL
ALT: 24 U/L (ref 0–53)
AST: 20 U/L (ref 0–37)
Albumin: 3.5 g/dL (ref 3.5–5.2)
Alkaline Phosphatase: 103 U/L (ref 39–117)
BUN: 15 mg/dL (ref 6–23)
CO2: 26 mEq/L (ref 19–32)
Calcium: 9.5 mg/dL (ref 8.4–10.5)
Chloride: 104 mEq/L (ref 96–112)
Creatinine, Ser: 1.04 mg/dL (ref 0.50–1.35)
GFR calc Af Amer: 80 mL/min — ABNORMAL LOW (ref >90)
GFR calc non Af Amer: 69 mL/min — ABNORMAL LOW (ref >90)
Glucose, Bld: 102 mg/dL — ABNORMAL HIGH (ref 70–99)
Potassium: 3.9 mEq/L (ref 3.5–5.1)
Sodium: 140 mEq/L (ref 135–145)
Total Bilirubin: 0.7 mg/dL (ref 0.3–1.2)
Total Protein: 7.1 g/dL (ref 6.0–8.3)

## 2011-08-21 LAB — CARDIAC PANEL(CRET KIN+CKTOT+MB+TROPI)
CK, MB: 4.1 ng/mL — ABNORMAL HIGH (ref 0.3–4.0)
Relative Index: 3 — ABNORMAL HIGH (ref 0.0–2.5)
Total CK: 135 U/L (ref 7–232)
Troponin I: <0.3 ng/mL (ref <0.30)

## 2011-08-21 LAB — POCT I-STAT TROPONIN I: Troponin i, poc: 0.02 ng/mL (ref 0.00–0.08)

## 2011-08-21 MED ORDER — IOHEXOL 300 MG/ML  SOLN
75.0000 mL | Freq: Once | INTRAMUSCULAR | Status: AC | PRN
  Administered 2011-08-21: 75 mL via INTRAVENOUS

## 2011-08-21 NOTE — ED Provider Notes (Signed)
History     CSN: [id number]  Arrival date & time 08/21/11  1105   First MD Initiated Contact with Patient 08/21/11 1117      Chief Complaint  Patient presents with  . Chest Pain    (Consider location/radiation/quality/duration/timing/severity/associated sxs/prior treatment) HPI Pt states he had some R sided neck pain last night but woke this morning with much worse pain. Pt was diaphoretic. Called his cardiology office and they advised further evaluation. Pt state the pain has eased off. Denies Cp or SOb at any point. No fever chills. States pain is worse with palpation Past Medical History  Diagnosis Date  . Back pain   . Leg pain   . Fatigue   . Myocardial infarct 03-17-2009    hx of   . COPD (chronic obstructive pulmonary disease)   . BPH (benign prostatic hypertrophy)   . Hyperlipidemia   . Glaucoma   . CLL (chronic lymphocytic leukemia)     hx of  . Erectile dysfunction   . Lumbar disc disease   . Hx of appendectomy   . Osteopenia   . AAA (abdominal aortic aneurysm)     measures 2.7 cm.  Marland Kitchen GERD (gastroesophageal reflux disease)   . Microscopic hematuria     Past Surgical History  Procedure Date  . Back surgery 1999  . Cervical disc surgery 2005-2008  . Nasal sinus surgery 2005  . Coronary angioplasty with stent placement 03-17-2009    x2. to the right coronary     Family History  Problem Relation Age of Onset  . Lung cancer Father 63    died.   . Alzheimer's disease Mother     died of natural causes    History  Substance Use Topics  . Smoking status: Former Smoker    Types: Cigarettes    Quit date: 03/15/2006  . Smokeless tobacco: Not on file  . Alcohol Use: No      Review of Systems  Constitutional: Positive for diaphoresis. Negative for fever and chills.  HENT: Positive for neck pain. Negative for sore throat, rhinorrhea, trouble swallowing and neck stiffness.   Respiratory: Negative for cough and shortness of breath.   Cardiovascular:  Negative for chest pain, palpitations and leg swelling.  Gastrointestinal: Negative for nausea, vomiting and abdominal pain.  Skin: Negative for rash and wound.  Neurological: Negative for dizziness, weakness, light-headedness, numbness and headaches.    Allergies  Bacitracin; Brimonidine tartrate; Lyrica; Nabumetone; Penicillins; Spiriva; Sulfonamide derivatives; Symbicort; and Adhesive  Home Medications   Current Outpatient Rx  Name Route Sig Dispense Refill  . ALBUTEROL SULFATE HFA 108 (90 BASE) MCG/ACT IN AERS Inhalation Inhale 2 puffs into the lungs every 6 (six) hours as needed. For shortness of breath    . ASPIRIN 81 MG PO TABS Oral Take 81 mg by mouth daily.     Marland Kitchen BIMATOPROST 0.03 % OP SOLN Both Eyes Place 1 drop into both eyes at bedtime.    Marland Kitchen FLUTICASONE-SALMETEROL 250-50 MCG/DOSE IN AEPB Inhalation Inhale 1 puff into the lungs every 12 (twelve) hours.    Marland Kitchen GABAPENTIN 300 MG PO CAPS Oral Take 600 mg by mouth 2 (two) times daily.     Marland Kitchen HYDROXYZINE HCL 25 MG PO TABS Oral Take 25 mg by mouth every 8 (eight) hours as needed. For itching    . METOPROLOL SUCCINATE ER 25 MG PO TB24 Oral Take 25 mg by mouth daily.     Marland Kitchen NITROGLYCERIN 0.4 MG SL SUBL Sublingual Place  0.4 mg under the tongue every 5 (five) minutes as needed. For chest pain    . PRASUGREL HCL 10 MG PO TABS Oral Take 1 tablet (10 mg total) by mouth daily. 30 tablet 11  . ROSUVASTATIN CALCIUM 10 MG PO TABS Oral Take 10 mg by mouth daily.     Marland Kitchen TAMSULOSIN HCL 0.4 MG PO CAPS Oral Take 0.4 mg by mouth daily.      . THEOPHYLLINE ER 300 MG PO CP24 Oral Take 300 mg by mouth daily. With food    . TIMOLOL HEMIHYDRATE 0.5 % OP SOLN Both Eyes Place 1 drop into both eyes 2 (two) times daily.      BP 122/84  Pulse 77  Temp(Src) 98.2 F (36.8 C) (Oral)  Resp 18  SpO2 97%  Physical Exam  Nursing note and vitals reviewed. Constitutional: He is oriented to person, place, and time. He appears well-developed and well-nourished. No  distress.  HENT:  Head: Normocephalic and atraumatic.  Mouth/Throat: Oropharynx is clear and moist.  Eyes: EOM are normal. Pupils are equal, round, and reactive to light.  Neck: Normal range of motion. Neck supple.       + R paratracheal TTP, ?Lymphadenopathy. No Bruits.   Cardiovascular: Normal rate and regular rhythm.  Exam reveals no gallop and no friction rub.   No murmur heard. Pulmonary/Chest: Effort normal and breath sounds normal. No respiratory distress. He has no wheezes. He has no rales.  Abdominal: Soft. Bowel sounds are normal. There is no tenderness. There is no rebound and no guarding.  Musculoskeletal: Normal range of motion. He exhibits no edema and no tenderness.  Neurological: He is alert and oriented to person, place, and time.       5/5 motor, sensation intact  Skin: Skin is warm and dry. No rash noted. No erythema.  Psychiatric: He has a normal mood and affect. His behavior is normal.    ED Course  Procedures (including critical care time)  Labs Reviewed  CBC - Abnormal; Notable for the following:    WBC 10.9 (*)    All other components within normal limits  CARDIAC PANEL(CRET KIN+CKTOT+MB+TROPI) - Abnormal; Notable for the following:    CK, MB 4.1 (*)    Relative Index 3.0 (*)    All other components within normal limits  COMPREHENSIVE METABOLIC PANEL - Abnormal; Notable for the following:    Glucose, Bld 102 (*)    GFR calc non Af Amer 69 (*)    GFR calc Af Amer 80 (*)    All other components within normal limits  DIFFERENTIAL  POCT I-STAT TROPONIN I   Dg Chest 2 View  08/21/2011  *RADIOLOGY REPORT*  Clinical Data: Chest pain  CHEST - 2 VIEW  Comparison: 08/11/2011  Findings: Cardiomediastinal silhouette is stable.  Mild hyperinflation again noted.  No acute infiltrate or pulmonary edema.  Stable minimal compression deformity mid thoracic spine. There is linear scarring or atelectasis in left base.  IMPRESSION: No acute infiltrate or pulmonary edema.  Mild  hyperinflation. Linear scarring or atelectasis in the left base.  Original Report Authenticated By: Natasha Mead, M.D.   Ct Soft Tissue Neck W Contrast  08/21/2011  *RADIOLOGY REPORT*  Clinical Data: Recent myocardial infarction with stent placement. Feeling of a lump in throat.  CT NECK WITH CONTRAST  Technique:  Multidetector CT imaging of the neck was performed with intravenous contrast.  Contrast: 75mL OMNIPAQUE IOHEXOL 300 MG/ML  SOLN  Comparison: None.  Findings: Suprahyoid neck:  No suprahyoid masses are seen.  There is abundant subcutaneous fat in the submental region.  Larynx:  Negative.  Infrahyoid neck:  No infrahyoid masses are seen.  No compromise of the airway.  Lymph nodes:  No pathologic lymphadenopathy  Upper chest/mediastinum:  Trachea midline.  Mild vascular calcification in the transverse arch, proximal great vessels. Slight fluid in the right major fissure. No nodules or infiltrates.  Additional:  Previous cervical fusion for spondylosis.  Normal alignment.  Visualized intracranial compartment unremarkable.  No acute sinus or mastoid disease.  Extensive dental procedures cast Hounsfield artifact. Bilateral carotid bifurcation calcification with more soft plaque on the right. No definite flow reducing lesion within limits of detection on CT neck.  Follow-up carotid Dopplers may be indicated for surveillance of cerebrovascular disease.  IMPRESSION: No visible neck mass or inflammatory process. See comments above.  Original Report Authenticated By: Elsie Stain, M.D.     1. Neck pain on right side       Date: 08/21/2011  Rate: 71  Rhythm: normal sinus rhythm  QRS Axis: normal  Intervals: normal  ST/T Wave abnormalities: normal  Conduction Disutrbances:none  Narrative Interpretation:   Old EKG Reviewed: unchanged   MDM   Discussed with Dr Myrtis Ser. No chest pain. Pain was different than what brought him in for previous intervention. First set of Trop was normal. Advises to repeat  and if remains normal can f/u with Dr Shirlee Latch as outpatient.        Loren Racer, MD 08/21/11 [id number]

## 2011-08-21 NOTE — ED Notes (Signed)
Pt. Stated, i started having some mild CP a couple of days ago and this morning it started a lot worse to were i was starting to sweat.

## 2011-08-21 NOTE — ED Notes (Signed)
Pt reports significant cardiac hx, last week MI and stent placement. Reports "mild" shoulder pain with radiation to neck over past few days but became worse this morning. Denies taking any nitro. Pain is intermittent. Reports this morning diaphoresis. No SOB. Pt called cardiology office told to come here.

## 2011-08-21 NOTE — ED Notes (Signed)
Pt. Stated, now its in my jaw, the pain

## 2011-08-21 NOTE — Telephone Encounter (Signed)
Patient and wife called this morning to ask about symptoms similar to his prior MI. This morning he felt poorly with R sided neck,shoulder, chest pain similar to when he had his MI. He broke out into a cold sweat and is concerned. He feels better now but was wondering what to do - I advised we can't reassure over the phone for sure that it wasn't his heart and he should proceed to ER for eval. Even though sx are atypical, they may be his usual anginal equivalent. They verbalized understanding and gratitude.

## 2011-08-23 ENCOUNTER — Ambulatory Visit (INDEPENDENT_AMBULATORY_CARE_PROVIDER_SITE_OTHER): Payer: Medicare Other | Admitting: Physician Assistant

## 2011-08-23 ENCOUNTER — Telehealth: Payer: Self-pay | Admitting: Physician Assistant

## 2011-08-23 ENCOUNTER — Encounter: Payer: Self-pay | Admitting: Physician Assistant

## 2011-08-23 VITALS — BP 118/90 | HR 91 | Ht 71.0 in | Wt 212.0 lb

## 2011-08-23 DIAGNOSIS — I2510 Atherosclerotic heart disease of native coronary artery without angina pectoris: Secondary | ICD-10-CM

## 2011-08-23 DIAGNOSIS — M542 Cervicalgia: Secondary | ICD-10-CM

## 2011-08-23 DIAGNOSIS — I779 Disorder of arteries and arterioles, unspecified: Secondary | ICD-10-CM

## 2011-08-23 MED ORDER — METOPROLOL SUCCINATE ER 25 MG PO TB24: 25.0000 mg | ORAL_TABLET | Freq: Two times a day (BID) | ORAL | Status: DC

## 2011-08-23 NOTE — Telephone Encounter (Signed)
s/w wife and she states pharmacy had a ? about the toprol xl dose. I s/w pharmacist and did verify that this was a dose increase to toprol xl 25 mg bid, pharmacist gave me verbal understanding today

## 2011-08-23 NOTE — Progress Notes (Signed)
[address] [place], [place]  [address] Phone: [phone] Fax:  [phone]  Date:  08/23/2011   Name:  [name]   DOB:  07-Apr-1935   MRN:  [id number]  PCP:  Georgann Housekeeper MD, MD  Primary Cardiologist:  Dr. Marca Ancona  Primary Electrophysiologist:  None    History of Present Illness: [name] is a 76 y.o. male who returns for post hospital follow up.  He has a hx of CAD s/p NSTEMI/PCI in 1/11 and L-spine stenosis with multiple prior back surgeries.  He had 2 DES placed in the RCA in 1/11 at the time of his NSTEMI. He was on Effient until 2/12. He had a myoview in 8/11 that showed no ischemia or infarction. This was done in anticipation of back surgery, but he did not have the surgery done.  Last echo in 9/12 showed EF 45-50%.   He was admitted recently with chest pain.  CKMBs were minimally elevated but Troponins remained negative.  LHC 08/11/11:  pCFX hazy concerning for thrombus/significant disease, pOM1 small 80-90%, RCA with overlapping stents in the ostium through the mid with up to 80% ISR prox and 50% ISR in the mid vessel, EF 55%.  The patient underwent successful PTCA and DES x1 to pCFX.  He also had successful cutting balloon angioplasty of in-stent restenosis in the proximal RCA.    Since d/c, he was seen back in the ED with right sided neck pain.  He was diaphoretic with this and it reminded him of his MI in 2011.  CEs were negative.  ECG was non-acute.  CXR was non-acute.  CT of the neck was also non-acute.  Plaque was noted in the carotids bilaterally.  He has felt well since his trip to the ED.  No chest pain.  He has chronic dyspnea without changes.  No orthopnea, PND or edema.   No exertional CP or right jaw pain.  No syncope.     Potassium  Date/Time Value Range Status  08/21/2011 11:53 AM 3.9  3.5-5.1 (mEq/L) Final     Creatinine, Ser  Date/Time Value Range Status  08/21/2011 11:53 AM 1.04  0.50-1.35 (mg/dL) Final   Hemoglobin    Date/Time Value Range Status  08/21/2011 11:53 AM 15.0  13.0-17.0 (g/dL) Final   Past Medical History: 1. CAD: NSTEMI in 12/10. Patient had DES x 2 to RCA on 03/17/09. Myoview in 8/11 showed EF 58%, no ischemia or infarction.  Admx for [place] and LHC 08/11/11:  pCFX hazy concerning for thrombus/significant disease, pOM1 small 80-90%, RCA with overlapping stents in the ostium through the mid with up to 80% ISR prox and 50% ISR in the mid vessel, EF 55%.  The patient underwent successful PTCA and DES x1 to pCFX.  He also had successful cutting balloon angioplasty of in-stent restenosis in the proximal RCA. 2. Hyperlipidemia  3. CLL  4. Erectile dysfunction  5. BPH  6. COPD  7. C-spine surgery after trauma in 2005 and 2008.  8. AAA: PCP follows with serial ultrasounds. Has been stable, around 3 cm.  9. Appendectomy  10. GERD  11. L-spine disc disease and spinal stenosis with multiple surgeries. Has chronic low back and leg pain.  12. Ischemic cardiomyopathy: echo (9/12) with EF 45-50%.    Current Outpatient Prescriptions  Medication Sig Dispense Refill  . albuterol (PROAIR HFA) 108 (90 BASE) MCG/ACT inhaler Inhale 2 puffs into the lungs every 6 (six) hours as needed. For shortness  of breath      . aspirin 81 MG tablet Take 81 mg by mouth daily.       . bimatoprost (LUMIGAN) 0.03 % ophthalmic solution Place 1 drop into both eyes at bedtime.      . gabapentin (NEURONTIN) 300 MG capsule Take 600 mg by mouth 2 (two) times daily.       . hydrOXYzine (ATARAX/VISTARIL) 25 MG tablet Take 25 mg by mouth every 8 (eight) hours as needed. For itching      . metoprolol succinate (TOPROL-XL) 25 MG 24 hr tablet Take 25 mg by mouth daily.       . nitroGLYCERIN (NITROSTAT) 0.4 MG SL tablet Place 0.4 mg under the tongue every 5 (five) minutes as needed. For chest pain      . nystatin (MYCOSTATIN) 100000 UNIT/ML suspension Take by mouth as directed.       . prasugrel (EFFIENT) 10 MG TABS Take 1 tablet (10 mg total)  by mouth daily.  30 tablet  11  . rosuvastatin (CRESTOR) 10 MG tablet Take 10 mg by mouth daily.       . Tamsulosin HCl (FLOMAX) 0.4 MG CAPS Take 0.4 mg by mouth daily.        . theophylline (THEO-24) 300 MG 24 hr capsule Take 300 mg by mouth daily. With food      . timolol (BETIMOL) 0.5 % ophthalmic solution Place 1 drop into both eyes 2 (two) times daily.      . Fluticasone-Salmeterol (ADVAIR DISKUS) 250-50 MCG/DOSE AEPB Inhale 1 puff into the lungs every 12 (twelve) hours. ( NOT TAKING )        Allergies: Allergies  Allergen Reactions  . Bacitracin     Unknown  . Brimonidine Tartrate     Unknown  . Lyrica (Pregabalin)     Unknown  . Nabumetone     REACTION: Reaction not known  . Penicillins     REACTION: Reaction not known  . Spiriva (Tiotropium Bromide Monohydrate)     Unknown  . Sulfonamide Derivatives     REACTION: Reaction not known  . Symbicort (Budesonide-Formoterol Fumarate)   . Adhesive (Tape) Rash and Other (See Comments)    Skin tear    History  Substance Use Topics  . Smoking status: Former Smoker    Types: Cigarettes    Quit date: 03/15/2006  . Smokeless tobacco: Never Used  . Alcohol Use: No     ROS:  Please see the history of present illness.   All other systems reviewed and negative.   PHYSICAL EXAM: VS:  BP 118/90  Pulse 91  Ht 5\' 11"  (1.803 m)  Wt 212 lb (96.163 kg)  BMI 29.57 kg/m2 Well nourished, well developed, in no acute distress HEENT: normal Neck: no JVD Lymph: no cervical adenopathy noted Cardiac:  normal S1, S2; RRR; no murmur Lungs:  clear to auscultation bilaterally, no wheezing, rhonchi or rales Abd: soft, nontender, no hepatomegaly Ext: no edema; right wrist without hematoma or bruit Skin: warm and dry Neuro:  CNs 2-12 intact, no focal abnormalities noted  EKG:  NSR, HR 91, normal axis, non-specific ST-T changes, PVC   ASSESSMENT AND PLAN:  1.  Coronary Artery Disease Doing well post PCI for [place]. He is on dual  antiplatelet therapy.  He plans to start cardiac rehab at [facility]. Recent right neck pain is likely not related to CAD. Of note, he has 80-90% in pOM1 that is small.  ? If he may be  having some angina from this.  Neck symptoms somewhat atypical, but he had some neck symptoms in the past that was his anginal equivalent.  CP is resolved.   I will try to advance anti-anginals.  Increase metoprolol XL to 25 mg bid.   2.  Neck Pain I have also recommended he follow up with his PCP.  3.  Carotid Artery Disease Plaque noted on CT. Will get carotid dopplers.  4.  COPD Management per pulmonary.   Signed, Tereso Newcomer, PA-C  11:56 AM 08/23/2011

## 2011-08-23 NOTE — Telephone Encounter (Signed)
[name] Just reviewed neck CT done in the ED last week. Looks like he has not had carotid dopplers in past and CT did note some plaque. This type of CT will not quantify stenosis, so he needs carotid dopplers. Can we arrange that? Let him know, neck pain is not caused by carotid stenosis. Thanks Tereso Newcomer, PA-C  8:33 PM 08/23/2011

## 2011-08-23 NOTE — Patient Instructions (Addendum)
Your physician has recommended you make the following change in your medication: INCREASE TOPROL XL TO 25 MG 1 TABLET TWICE DAILY   PLEASE FOLLOW UP WITH YOUR PRIMARY CARE PHYSICIAN ABOUT YOUR NECK PAIN  MAKE AN APPOINTMENT TO SEE DR. MCLEAN SOMETIME EARLY IN [place]

## 2011-08-23 NOTE — Telephone Encounter (Signed)
s/w wife and she states pharmacy had a ? about the toprol xl dose. I s/w pharmacist and did verify that this was a dose increase to toprol xl 25 mg bid, pharmacist gave me verbal understanding today 

## 2011-08-23 NOTE — Telephone Encounter (Signed)
New Problem:    Called needing clarification on daily dose of metoprolol succinate (TOPROL-XL) 25 MG 24 hr tablet.  Please call back.

## 2011-08-24 ENCOUNTER — Telehealth: Payer: Self-pay | Admitting: *Deleted

## 2011-08-24 DIAGNOSIS — E785 Hyperlipidemia, unspecified: Secondary | ICD-10-CM

## 2011-08-24 NOTE — Telephone Encounter (Signed)
lmom ptcb to d/w about ct results from hospital and to schedule a carotid doppler duplex per Scott W. PAC 

## 2011-08-24 NOTE — Telephone Encounter (Signed)
Carotid order placed today for 08/27/11 @ 12:30 pt and wife aware of appt

## 2011-08-24 NOTE — Telephone Encounter (Signed)
lmom ptcb to d/w about ct results from hospital and to schedule a carotid doppler duplex per Bing Neighbors. PAC

## 2011-08-24 NOTE — Telephone Encounter (Signed)
Scheduled carotid with pt's wife to be done 08/27/11 12:30

## 2011-08-24 NOTE — Telephone Encounter (Signed)
Fu call °Patient returning your call °

## 2011-08-27 ENCOUNTER — Ambulatory Visit (INDEPENDENT_AMBULATORY_CARE_PROVIDER_SITE_OTHER): Payer: Medicare Other | Admitting: Internal Medicine

## 2011-08-27 ENCOUNTER — Encounter (INDEPENDENT_AMBULATORY_CARE_PROVIDER_SITE_OTHER): Payer: Medicare Other

## 2011-08-27 DIAGNOSIS — E785 Hyperlipidemia, unspecified: Secondary | ICD-10-CM

## 2011-08-27 DIAGNOSIS — J449 Chronic obstructive pulmonary disease, unspecified: Secondary | ICD-10-CM

## 2011-08-27 DIAGNOSIS — I6529 Occlusion and stenosis of unspecified carotid artery: Secondary | ICD-10-CM

## 2011-08-31 ENCOUNTER — Encounter: Payer: Self-pay | Admitting: Internal Medicine

## 2011-09-01 ENCOUNTER — Telehealth: Payer: Self-pay | Admitting: Internal Medicine

## 2011-09-01 ENCOUNTER — Telehealth: Payer: Self-pay | Admitting: Cardiology

## 2011-09-01 NOTE — Telephone Encounter (Signed)
Decrease Toprol XL to 25 mg daily.

## 2011-09-01 NOTE — Telephone Encounter (Signed)
Spoke with wife about carotid doppler results. Wife states pt has complained of increasing tiredness and fatigue since Toprol XL was increased to 25mg  bid 08/23/11.  Recent BP and heart rate readings:  08/29/11 BP 99/73 95   112/74 99    112/75 90   110/78 110   08/30/11 103/75 91   101/69  107     109/67 107    105/73 95    08/31/11 105/70 85   107/75 85   115/84 102    09/01/11 126/83 93   116/84 99. Pt denies lightheadedness / dizziness or chest pain.  Wife states pt seems to be hydrated. i will forward to Dr Shirlee Latch for review and recommendations.

## 2011-09-01 NOTE — Telephone Encounter (Signed)
I spoke with spouse and advised her I do not see where our office has tried calling them. She voiced her understanding and needed nothing further

## 2011-09-01 NOTE — Telephone Encounter (Signed)
New problem:  Patient wife calling returning call back to nurse.

## 2011-09-02 MED ORDER — METOPROLOL SUCCINATE ER 25 MG PO TB24: 25.0000 mg | ORAL_TABLET | Freq: Every day | ORAL | Status: DC

## 2011-09-02 NOTE — Telephone Encounter (Signed)
Spoke with pt and he will decrease Toprol XL to 25mg  daily.

## 2011-09-04 NOTE — Progress Notes (Signed)
Documentation for 6 minute walk test 

## 2011-09-10 ENCOUNTER — Ambulatory Visit (INDEPENDENT_AMBULATORY_CARE_PROVIDER_SITE_OTHER): Payer: Medicare Other | Admitting: Internal Medicine

## 2011-09-10 ENCOUNTER — Encounter: Payer: Self-pay | Admitting: Internal Medicine

## 2011-09-10 VITALS — BP 126/80 | HR 92 | Ht 71.0 in | Wt 210.8 lb

## 2011-09-10 DIAGNOSIS — J449 Chronic obstructive pulmonary disease, unspecified: Secondary | ICD-10-CM

## 2011-09-10 DIAGNOSIS — M79609 Pain in unspecified limb: Secondary | ICD-10-CM

## 2011-09-10 DIAGNOSIS — G4733 Obstructive sleep apnea (adult) (pediatric): Secondary | ICD-10-CM

## 2011-09-10 DIAGNOSIS — M79606 Pain in leg, unspecified: Secondary | ICD-10-CM

## 2011-09-10 NOTE — Progress Notes (Signed)
08/06/11- 64 yoM former smoker referred by Dr.Husain because of COPD. Wife is here. He smoked 2 packs per day, stopping in 2008. Had pneumonia vaccine in 2008 and flu vaccine in 2012 Diagnosed with COPD in 2005. He can't tell if Advair or Proair help. He has a nebulizer with no medication. Spiriva caused rash, swelling and itching. He had intolerance to Symbicort but doesn't remember details.  Hip pain limits his walking before shortness of breath does. He notices shortness of breath with activity but little cough and no phlegm or wheezing. He does not use oxygen. PFT 06/18/2011-moderate obstructive airways disease with slight response to bronchodilator, mild restriction, severe reduction in diffusion capacity. FEV1 1.88/60%, FEV1/FVC 0.59. Insignificant response to bronchodilator. Comorbidities include glaucoma, cardiovascular disease with history of myocardial infarction/ stents and stable  abdominal aortic aneurysm. Family history a brother with asthma and father who smoked. They both had lung cancer. 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  09/10/11- 47 yoM former smoker referred by Dr.Husain because of COPD. Wife is here. Recently had MI(08-11-11---Dr Shirlee Latch); has not had any appetite for past 4 weeks; wife is concerned about high heartrate as well with breathing. COPD assessment test (CAT) score 13/40 today. Complaint of leg cramps last visit stopped after he tried tonic water. We discussed rash present before and after trying theophylline. He saw no beneficial effect from theophylline. Hydroxyzine relieved the itching and rash. Alpha-1-antitrypsin WNL MM.  Easy shortness of breath with exertion especially her in walking or up steps. His cardiologist told him sleep apnea was suspected while he was in hospital. He admits loud snoring, confirmed by his wife, and daytime sleepiness. They're  less sure about apnea. We discussed getting a sleep study. He has a history of glaucoma and a trial of Spiriva made him itch so we stay away from that class. CXR 08/21/11- reviewed IMPRESSION:  No acute infiltrate or pulmonary edema. Mild hyperinflation.  Linear scarring or atelectasis in the left base.  Original Report Authenticated By: Natasha Mead, M.D.   ROS-see HPI Constitutional:   No-   weight loss, night sweats, fevers, chills, fatigue, lassitude. HEENT:   No-  headaches, difficulty swallowing, tooth/dental problems, sore throat,       Controlled  sneezing, itching, ear ache, nasal congestion, post nasal drip, + snoring CV:  No-   chest pain, orthopnea, PND, swelling in lower extremities, anasarca, dizziness, palpitations Resp: +  shortness of breath with exertion or at rest.              No-   productive cough,  No non-productive cough,  No- coughing up of blood.              No-   change in color of mucus.  No- wheezing.   Skin: No-   rash or lesions. GI:  No-   heartburn, indigestion, abdominal pain, nausea, vomiting, GU:  MS:  No-   joint pain or swelling.  Neuro-     nothing unusual Psych:  No- change in mood or affect. No depression or anxiety.  No memory loss.  OBJ- Physical Exam General- Alert, Oriented, Affect-appropriate, Distress- none acute, overweight Skin- rash-none, lesions- none, excoriation- none. Fragile skin ecchymoses on arms. Lymphadenopathy- none Head- atraumatic            Eyes- Gross vision intact, PERRLA, conjunctivae and secretions clear  Ears- Hearingaidsl            Nose- Clear, no-Septal dev, mucus, polyps, erosion, perforation             Throat- Mallampati IV , mucosa clear , drainage- none, tonsils- atrophic Neck- flexible , trachea midline, no stridor , thyroid nl, carotid no bruit Chest - symmetrical excursion , unlabored           Heart/CV- RRR , no murmur , no gallop  , no rub, nl s1 s2                           - JVD- none , edema-  none, stasis changes- none, varices- none           Lung- diminished with trace crackles in bases, wheeze- none, cough- none , dullness-none, rub- none, unlabored           Chest wall-  Abd-  Br/ Gen/ Rectal- Not done, not indicated Extrem- cyanosis- none, clubbing, none, atrophy- none, strength- nl Neuro- grossly intact to observation

## 2011-09-10 NOTE — Patient Instructions (Addendum)
Cancel appointment with me for July 2  Order- schedule NPSG split protocol  Dx OSA  Stop theophyline

## 2011-09-13 DIAGNOSIS — G4733 Obstructive sleep apnea (adult) (pediatric): Secondary | ICD-10-CM | POA: Insufficient documentation

## 2011-09-13 NOTE — Assessment & Plan Note (Signed)
Plan-after appropriate discussion with him and his wife, we are scheduling a sleep study

## 2011-09-13 NOTE — Assessment & Plan Note (Signed)
Leg cramps while resting seem to have improved with tonic water/quinine

## 2011-09-13 NOTE — Assessment & Plan Note (Signed)
Plan-stop theophylline, not helpful. Watch to see if this reduces his rapid heartbeat and improves his appetite. Consider a trial of tudorza despite his glaucoma if necessary

## 2011-09-14 ENCOUNTER — Institutional Professional Consult (permissible substitution): Payer: Medicare Other | Admitting: Internal Medicine

## 2011-09-24 ENCOUNTER — Telehealth: Payer: Self-pay | Admitting: Internal Medicine

## 2011-09-24 NOTE — Telephone Encounter (Signed)
Called [employer] Medicare Completed and spoke with Ty W. She stated that proc code [address] does not require pre cert. Called and spoke with patient and advised wife that pre cert was not required for this study.   Separate Issue-  On 09/10/11 ov patient and wife were advised by Dr. Maple Hudson to D/C Theo. Medication. Wife wanted Dr. Maple Hudson to know that pt stopped the Theo on 09/09/11 and within 2 days, patient heart rate has gone back down to 74 and patient feels much better. Just FYI for Dr. Maple Hudson. Rhonda J Cobb

## 2011-09-24 NOTE — Telephone Encounter (Signed)
PLease advise Pcc, THANKS

## 2011-10-03 ENCOUNTER — Ambulatory Visit (HOSPITAL_BASED_OUTPATIENT_CLINIC_OR_DEPARTMENT_OTHER): Payer: Medicare Other | Attending: Internal Medicine | Admitting: Radiology

## 2011-10-03 VITALS — Ht 71.0 in | Wt 210.0 lb

## 2011-10-03 DIAGNOSIS — G4761 Periodic limb movement disorder: Secondary | ICD-10-CM | POA: Insufficient documentation

## 2011-10-03 DIAGNOSIS — G4733 Obstructive sleep apnea (adult) (pediatric): Secondary | ICD-10-CM

## 2011-10-09 DIAGNOSIS — G4733 Obstructive sleep apnea (adult) (pediatric): Secondary | ICD-10-CM

## 2011-10-09 DIAGNOSIS — G4761 Periodic limb movement disorder: Secondary | ICD-10-CM

## 2011-10-10 NOTE — Procedures (Signed)
[name], [name]                 ACCOUNT NO.:  [id number]  MEDICAL RECORD NO.:  [id number]          PATIENT TYPE:  OUT  LOCATION:  SLEEP CENTER                 FACILITY:  [facility]  PHYSICIAN:  Kateria Cutrona D. Maple Hudson, MD, FCCP, FACPDATE OF BIRTH:  08-01-[birth year]  DATE OF STUDY:  10/03/2011                           NOCTURNAL POLYSOMNOGRAM  REFERRING PHYSICIAN:  Robbin Escher D. Terryl Molinelli, MD, FCCP, FACP  INDICATION FOR STUDY:  Hypersomnia with sleep apnea.  EPWORTH SLEEPINESS SCORE:  6/24.  BMI 29.3.  Weight 210 pounds, height 71 inches, neck 17 inches.  MEDICATIONS:  Home medications are charted and reviewed.  SLEEP ARCHITECTURE:  Split study protocol.  During the diagnostic phase, total sleep time 1 minutes with sleep efficiency 89.3%.  Stage I was 2.8%, stage II 64.8%, stage III absent, REM 32.4% of total sleep time. Sleep latency 13 minutes, REM latency 41 minutes, awake after sleep onset 2 minutes.  Arousal index 21.1.  BEDTIME MEDICATION:  None.  RESPIRATORY DATA:  Apnea-hypopnea index (AHI) 28.8 per hour.  A total of 60 events was scored including 50 obstructive apneas, 2 central apneas, 1 mixed apnea, and 7 hypopneas.  Events were associated with nonsupine sleep position.  REM AHI 22 per hour.  CPAP was titrated to 10 CWP, AHI 0.9 per hour.  He wore a large ResMed Quattro FX full-face mask with heated humidifier and 1 L/minute supplemental oxygen.  OXYGEN DATA:  Moderate snoring before CPAP with oxygen desaturation to a nadir of 78% on room air.  Because of persistent desaturation, the technician added oxygen at 1 L/minute.  With CPAP titration, snoring was prevented and mean oxygen saturation wearing CPAP plus 1 L nasal oxygen was 91.1%.  CARDIAC DATA:  Sinus rhythm with PVCs.  MOVEMENT-PARASOMNIA:  During the diagnostic phase, a total of 19 limb jerks were counted, of which 5 were associated with arousal or awakening for periodic limb movement with arousal index of 2.4 per hour.   During the titration phase, a total of 173 limb jerks were counted, of which 22 were associated with arousals or awakening for periodic limb movement with arousal index of 6.8 per hour.  No bathroom trips.  IMPRESSIONS-RECOMMENDATIONS: 1. Moderate obstructive sleep apnea/hypopnea syndrome, AHI 28.8 per     hour with non-supine events.  Moderate snoring and oxygen     desaturation to a nadir of 78% on room air. 2. Successful continuous positive airway pressure titration to 10 CWP,     AHI 0.9 per hour.  He wore a large ResMed Quattro FX full-face mask     with heated humidifier.  Snoring was prevented.  Supplemental     oxygen at 1 L/minute gave oxygen saturation mean of 91.1%. 3. On arrival, room air oxygen saturation was 94%.  Oxygen     desaturation as noted above, with supplemental oxygen added at 1     L/minute for the last two thirds of the study night.  He will need     evaluation of home oxygen status when he has adjusted to the     continuous positive airway pressure in the home environment. 4. Periodic limb movement with arousal.  Mild periodic limb  movement     was noted during titration with the total of 19 events, of which 5     were associated with arousals or awakening for an index of 2.4 per     hour.  5.  During titration, 173 limb jerks were associated with 22     awakenings or arousals for periodic limb movement with arousal     index of 6.8 per hour.  The disturbances of CPAP titration often     leads to an increased number of limb jerks.  If this appears to be     a problem affecting sleep quality significantly in the home     environment after adjustment to home continuous positive airway     pressure, then specific therapy such as ReQuip or Mirapex might be     considered if clinically appropriate.     Denielle Bayard D. Maple Hudson, MD, [facility], FACP Diplomate, American Board of Sleep Medicine    CDY/MEDQ  D:  10/09/2011 15:16:16  T:  10/10/2011 06:03:35  Job:  [id number]

## 2011-10-12 ENCOUNTER — Encounter: Payer: Self-pay | Admitting: *Deleted

## 2011-10-19 ENCOUNTER — Ambulatory Visit (INDEPENDENT_AMBULATORY_CARE_PROVIDER_SITE_OTHER): Payer: Medicare Other | Admitting: Cardiology

## 2011-10-19 ENCOUNTER — Encounter: Payer: Self-pay | Admitting: Cardiology

## 2011-10-19 VITALS — BP 131/82 | HR 69 | Ht 71.0 in | Wt 212.8 lb

## 2011-10-19 DIAGNOSIS — J449 Chronic obstructive pulmonary disease, unspecified: Secondary | ICD-10-CM

## 2011-10-19 DIAGNOSIS — I2510 Atherosclerotic heart disease of native coronary artery without angina pectoris: Secondary | ICD-10-CM

## 2011-10-19 DIAGNOSIS — E785 Hyperlipidemia, unspecified: Secondary | ICD-10-CM

## 2011-10-19 DIAGNOSIS — I6529 Occlusion and stenosis of unspecified carotid artery: Secondary | ICD-10-CM

## 2011-10-19 NOTE — Patient Instructions (Addendum)
Your physician has requested that you have a carotid duplex. This test is an ultrasound of the carotid arteries in your neck. It looks at blood flow through these arteries that supply the brain with blood. Allow one hour for this exam. There are no restrictions or special instructions. December 2013   Your physician recommends that you schedule a follow-up appointment in: 4 months with Dr Shirlee Latch.

## 2011-10-19 NOTE — Progress Notes (Signed)
Patient ID: [name], male   DOB: October 24, 1935, 76 y.o.   MRN: [id number] PCP: Dr. Rene Paci  76 yo with history of CAD s/p NSTEMI/PCI in 1/11 and unstable angina/PCI in 5/13 as well as L-spine stenosis with multiple prior back surgeries presents for cardiology followup.  He had 2 DES placed in the RCA in 1/11 at the time of his NSTEMI.  He was on Effient until 2/12.  He had a myoview in 8/11 that showed no ischemia or infarction.  This was done in anticipation of back surgery, but he did not have the surgery done.  Last echo in 9/12 showed EF 45-50%.  He was admitted with unstable angina in 5/13 and had DES to proximal LCX and cutting balloon angioplasty to proximal RCA in-stent restenosis.    Since discharge in 5/13, he has been stable.  He has not had any chest pain. He can walk on flat ground without dyspnea but is short of breath walking up a hill.  He can climb a flight of steps without problems.  He played 18 holes of golf yesterday.   Patient's main complaint continues to be chronic low back pain that is worse with standing and walking.  He has had numerous operations on his C-spine and L-spine.  He has L-spine stenosis.  At this point, the pain is tolerable.  He has seen a number of doctors in consultation about his back pain and has decided against additional surgical intervention.   [name] had a sleep study this month, it has not yet been officially reported.   ECG: NSR, normal  Labs (3/12): K 5, creatinine 1.05, LDL 42, HDL 41 Labs (6/13): K 3.9, creatinine 1.04, LDL 44, HDL 67  PMH: 1. CAD: NSTEMI in 12/10.  Patient had DES x 2 to RCA on 03/17/09. Myoview in 8/11 showed EF 58%, no ischemia or infarction. Unstable angina in 5/13.  Patient had DES to proximal LCX and cutting balloon angioplasty to proximal RCA in-stent restenosis with 80-90% stenosis in a small OM1 also noted.  EF at cath was 55%.  2. Hyperlipidemia 3. CLL 4. Erectile dysfunction 5. BPH 6. COPD 7. C-spine surgery  after trauma in 2005 and 2008.  8. AAA: PCP follows with serial ultrasounds.  Has been stable, around 3 cm.  9. Appendectomy 10. GERD 11. L-spine disc disease and spinal stenosis with multiple surgeries.  Has chronic low back and leg pain.  12. Ischemic cardiomyopathy: echo (9/12) with EF 45-50%. LV-gram with EF 55% in 5/13.  13. Carotid stenosis: 6/13 carotids with 60-79% RICA stenosis.  14. Hyperkalemia with ACEI  SH: Stopped smoking in 2008.  Married, lives in [place].   FH: Father died of lung cancer.  Mother with dementia.   ROS: All systems reviewed and negative except as per HPI.   Current Outpatient Prescriptions  Medication Sig Dispense Refill  . albuterol (PROAIR HFA) 108 (90 BASE) MCG/ACT inhaler Inhale 2 puffs into the lungs every 6 (six) hours as needed. For shortness of breath      . aspirin 81 MG tablet Take 81 mg by mouth daily.       . bimatoprost (LUMIGAN) 0.03 % ophthalmic solution Place 1 drop into both eyes at bedtime.      . gabapentin (NEURONTIN) 300 MG capsule Take 600 mg by mouth 2 (two) times daily.       . hydrOXYzine (ATARAX/VISTARIL) 25 MG tablet Take 25 mg by mouth every 8 (eight) hours as needed. For  itching      . metoprolol succinate (TOPROL XL) 25 MG 24 hr tablet Take 1 tablet (25 mg total) by mouth daily.  30 tablet  6  . nitroGLYCERIN (NITROSTAT) 0.4 MG SL tablet Place 0.4 mg under the tongue every 5 (five) minutes as needed. For chest pain      . prasugrel (EFFIENT) 10 MG TABS Take 1 tablet (10 mg total) by mouth daily.  30 tablet  11  . rosuvastatin (CRESTOR) 10 MG tablet Take 10 mg by mouth daily.       . Tamsulosin HCl (FLOMAX) 0.4 MG CAPS Take 0.4 mg by mouth daily.        . timolol (BETIMOL) 0.5 % ophthalmic solution Place 1 drop into both eyes 2 (two) times daily.        BP 131/82  Pulse 69  Ht 5\' 11"  (1.803 m)  Wt 212 lb 12.8 oz (96.525 kg)  BMI 29.68 kg/m2 General: NAD Neck: No JVD, no thyromegaly or thyroid nodule.  Lungs: Clear to  auscultation bilaterally with normal respiratory effort. CV: Nondisplaced PMI.  Heart regular S1/S2, no S3/S4, no murmur.  No peripheral edema.  Soft right carotid bruit.  1+ PT pulses bilaterally. Bilateral lower leg venous varicosities.  Abdomen: Soft, nontender, no hepatosplenomegaly, no distention.  Neurologic: Alert and oriented x 3.  Psych: Normal affect. Extremities: No clubbing or cyanosis.

## 2011-10-19 NOTE — Assessment & Plan Note (Signed)
LDL at goal (< 70) in 6/13.  Continue current statin.

## 2011-10-19 NOTE — Assessment & Plan Note (Signed)
Stable status post PCI in 5/13 with no exertional chest pain and stable exertional dyspnea.  EF preserved on LV-gram.  He will continue ASA 81 mg daily, Toprol XL 25 mg daily, Crestor, and Effient.  He will need to be on Effient until 5/14.  He has not been able to tolerate ACEI in the past due to hyperkalemia, and he has had fatigue with > 25 mg daily Toprol XL.

## 2011-10-19 NOTE — Assessment & Plan Note (Signed)
I suspect that [name]'s mild exertional dyspnea is due more to COPD than to his heart at this point.

## 2011-10-19 NOTE — Assessment & Plan Note (Signed)
Repeat carotid dopplers in 12/13 to assess for progression of disease.

## 2011-11-03 ENCOUNTER — Ambulatory Visit: Payer: Medicare Other | Admitting: Internal Medicine

## 2011-11-05 ENCOUNTER — Encounter: Payer: Self-pay | Admitting: Internal Medicine

## 2011-11-05 ENCOUNTER — Ambulatory Visit (INDEPENDENT_AMBULATORY_CARE_PROVIDER_SITE_OTHER): Payer: Medicare Other | Admitting: Internal Medicine

## 2011-11-05 VITALS — BP 132/90 | HR 84 | Ht 71.0 in | Wt 212.8 lb

## 2011-11-05 DIAGNOSIS — G4733 Obstructive sleep apnea (adult) (pediatric): Secondary | ICD-10-CM

## 2011-11-05 DIAGNOSIS — J449 Chronic obstructive pulmonary disease, unspecified: Secondary | ICD-10-CM

## 2011-11-05 NOTE — Progress Notes (Signed)
08/06/11- 74 yoM former smoker referred by Dr.Husain because of COPD. Wife is here. He smoked 2 packs per day, stopping in 2008. Had pneumonia vaccine in 2008 and flu vaccine in 2012 Diagnosed with COPD in 2005. He can't tell if Advair or Proair help. He has a nebulizer with no medication. Spiriva caused rash, swelling and itching. He had intolerance to Symbicort but doesn't remember details.  Hip pain limits his walking before shortness of breath does. He notices shortness of breath with activity but little cough and no phlegm or wheezing. He does not use oxygen. PFT 06/18/2011-moderate obstructive airways disease with slight response to bronchodilator, mild restriction, severe reduction in diffusion capacity. FEV1 1.88/60%, FEV1/FVC 0.59. Insignificant response to bronchodilator. Comorbidities include glaucoma, cardiovascular disease with history of myocardial infarction/ stents and stable  abdominal aortic aneurysm. Family history a brother with asthma and father who smoked. They both had lung cancer. 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  09/10/11- 86 yoM former smoker referred by Dr.Husain because of COPD. Wife is here. Recently had MI(08-11-11---Dr Shirlee Latch); has not had any appetite for past 4 weeks; wife is concerned about high heartrate as well with breathing. COPD assessment test (CAT) score 13/40 today. Complaint of leg cramps last visit stopped after he tried tonic water. We discussed rash present before and after trying theophylline. He saw no beneficial effect from theophylline. Hydroxyzine relieved the itching and rash. Alpha-1-antitrypsin WNL MM.  Easy shortness of breath with exertion especially her in walking or up steps. His cardiologist told him sleep apnea was suspected while he was in hospital. He admits loud snoring, confirmed by his wife, and daytime sleepiness. They're  less sure about apnea. We discussed getting a sleep study. He has a history of glaucoma and a trial of Spiriva made him itch so we stay away from that class. CXR 08/21/11- reviewed IMPRESSION:  No acute infiltrate or pulmonary edema. Mild hyperinflation.  Linear scarring or atelectasis in the left base.  Original Report Authenticated By: Natasha Mead, M.D.    11/05/11- 4 yoM former smoker followed for COPD, OSA. Complicated by hx CAD/ MI.   PCP Dr Donette Larry           Wife is here. Review sleep study with patient;  NPSG 10/03/11- AHI 28.8/ hr; CPAP titrated to 10/ hr. supplemental oxygen was added at 1 L per minute until CPAP started. Stopped Theophylline on 09-10-11 and by 09-2011 heart rate came down so MD told to stay off of medication.  ROS-see HPI Constitutional:   No-   weight loss, night sweats, fevers, chills, fatigue, lassitude. HEENT:   No-  headaches, difficulty swallowing, tooth/dental problems, sore throat,       Controlled  sneezing, itching, ear ache, nasal congestion, post nasal drip, + snoring CV:  No-   chest pain, orthopnea, PND, swelling in lower extremities, anasarca, dizziness, palpitations Resp: +  shortness of breath with exertion or at rest.              No-   productive cough,  No non-productive cough,  No- coughing up of blood.              No-   change in color of mucus.  No- wheezing.   Skin: No-   rash or lesions. GI:  No-   heartburn, indigestion, abdominal pain, nausea, vomiting, GU:  MS:  No-   joint pain  or swelling.  Neuro-     nothing unusual Psych:  No- change in mood or affect. No depression or anxiety.  No memory loss.  OBJ- Physical Exam General- Alert, Oriented, Affect-appropriate, Distress- none acute, overweight Skin- rash-none, lesions- none, excoriation- none. Fragile skin ecchymoses on arms. Lymphadenopathy- none Head- atraumatic            Eyes- Gross vision intact, PERRLA, conjunctivae and secretions clear            Ears- Hearing aids             Nose- Clear, no-Septal dev, mucus, polyps, erosion, perforation             Throat- Mallampati IV , mucosa clear , drainage- none, tonsils- atrophic Neck- flexible , trachea midline, no stridor , thyroid nl, carotid no bruit Chest - symmetrical excursion , unlabored           Heart/CV- RRR , no murmur , no gallop  , no rub, nl s1 s2                           - JVD- none , edema- none, stasis changes- none, varices- none           Lung- diminished , wheeze- none, cough- none , dullness-none, rub- none, unlabored           Chest wall-  Abd-  Br/ Gen/ Rectal- Not done, not indicated Extrem- cyanosis- none, clubbing, none, atrophy- none, strength- nl Neuro- grossly intact to observation

## 2011-11-05 NOTE — Patient Instructions (Addendum)
Order- [facility] new CPAP 10, mask of choice, humidifier, supplies   Dx OSA  Please call as needed

## 2011-11-14 ENCOUNTER — Encounter: Payer: Self-pay | Admitting: Internal Medicine

## 2011-11-14 NOTE — Assessment & Plan Note (Signed)
Obstructive sleep apnea initially suspected by his cardiologist and confirmed on sleep study. We discussed the physiology and treatment. Emphasis on weight control and his driving responsibility. Discussed treatment options. He will try CPAP.

## 2011-11-14 NOTE — Assessment & Plan Note (Signed)
His breathing was not affected by stopping theophylline. No acute exacerbation since last here.

## 2011-11-23 ENCOUNTER — Telehealth: Payer: Self-pay | Admitting: Internal Medicine

## 2011-11-23 DIAGNOSIS — G4733 Obstructive sleep apnea (adult) (pediatric): Secondary | ICD-10-CM

## 2011-11-23 NOTE — Telephone Encounter (Signed)
Has been trying cpap x2wks RT @[facility]  changed the mask and that still did not help trying to sleep with it but only getting little naps ,nose dry,and water is on 5 not scheduled to see dr young until 12/20/11 any suggestions , not getting any rest Tobe Sos

## 2011-11-23 NOTE — Telephone Encounter (Signed)
Needs time to adjust to CPAP 1) DME reduce CPAP from 10 to 9        Dx OSA 2) Try otc saline nasal gel at bedtime and as needed for dry nose 3) try otc Biotene at bedtime for dry mouth 4) script sonata 5 mg # 15, ref x 1          1 at bedtime for sleep if needed

## 2011-11-24 MED ORDER — ZALEPLON 5 MG PO CAPS: 5.0000 mg | ORAL_CAPSULE | Freq: Every evening | ORAL | Status: DC | PRN

## 2011-11-24 NOTE — Telephone Encounter (Signed)
Spoke with pt and notified of recs per CDY. Pt verbalized understanding and states nothing further needed. Rx for sonata was called to pharm.

## 2011-12-10 ENCOUNTER — Encounter: Payer: Self-pay | Admitting: Internal Medicine

## 2011-12-20 ENCOUNTER — Encounter: Payer: Self-pay | Admitting: Internal Medicine

## 2011-12-20 ENCOUNTER — Ambulatory Visit (INDEPENDENT_AMBULATORY_CARE_PROVIDER_SITE_OTHER): Payer: Medicare Other | Admitting: Internal Medicine

## 2011-12-20 ENCOUNTER — Ambulatory Visit (HOSPITAL_BASED_OUTPATIENT_CLINIC_OR_DEPARTMENT_OTHER): Payer: Medicare Other | Attending: Internal Medicine

## 2011-12-20 VITALS — BP 120/82 | HR 77 | Ht 71.0 in | Wt 211.8 lb

## 2011-12-20 DIAGNOSIS — G4733 Obstructive sleep apnea (adult) (pediatric): Secondary | ICD-10-CM

## 2011-12-20 DIAGNOSIS — J449 Chronic obstructive pulmonary disease, unspecified: Secondary | ICD-10-CM

## 2011-12-20 NOTE — Progress Notes (Signed)
08/06/11- 61 yoM former smoker referred by Dr.Husain because of COPD. Wife is here. He smoked 2 packs per day, stopping in 2008. Had pneumonia vaccine in 2008 and flu vaccine in 2012 Diagnosed with COPD in 2005. He can't tell if Advair or Proair help. He has a nebulizer with no medication. Spiriva caused rash, swelling and itching. He had intolerance to Symbicort but doesn't remember details.  Hip pain limits his walking before shortness of breath does. He notices shortness of breath with activity but little cough and no phlegm or wheezing. He does not use oxygen. PFT 06/18/2011-moderate obstructive airways disease with slight response to bronchodilator, mild restriction, severe reduction in diffusion capacity. FEV1 1.88/60%, FEV1/FVC 0.59. Insignificant response to bronchodilator. Comorbidities include glaucoma, cardiovascular disease with history of myocardial infarction/ stents and stable  abdominal aortic aneurysm. Family history a brother with asthma and father who smoked. They both had lung cancer. 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  09/10/11- 80 yoM former smoker referred by Dr.Husain because of COPD. Wife is here. Recently had MI(08-11-11---Dr Shirlee Latch); has not had any appetite for past 4 weeks; wife is concerned about high heartrate as well with breathing. COPD assessment test (CAT) score 13/40 today. Complaint of leg cramps last visit stopped after he tried tonic water. We discussed rash present before and after trying theophylline. He saw no beneficial effect from theophylline. Hydroxyzine relieved the itching and rash. Alpha-1-antitrypsin WNL MM.  Easy shortness of breath with exertion especially her in walking or up steps. His cardiologist told him sleep apnea was suspected while he was in hospital. He admits loud snoring, confirmed by his wife, and daytime sleepiness. They're  less sure about apnea. We discussed getting a sleep study. He has a history of glaucoma and a trial of Spiriva made him itch so we stay away from that class. CXR 08/21/11- reviewed IMPRESSION:  No acute infiltrate or pulmonary edema. Mild hyperinflation.  Linear scarring or atelectasis in the left base.  Original Report Authenticated By: Natasha Mead, M.D.    11/05/11- 14 yoM former smoker followed for COPD, OSA. Complicated by hx CAD/ MI.   PCP Dr Donette Larry           Wife is here. Review sleep study with patient;  NPSG 10/03/11- AHI 28.8/ hr; CPAP titrated to 10/ hr. supplemental oxygen was added at 1 L per minute until CPAP started. Stopped Theophylline on 09-10-11 and by 09-2011 heart rate came down so MD told to stay off of medication.  12/20/11- 52 yoM former smoker followed for COPD, OSA. Complicated by hx CAD/ MI.   PCP Dr Donette Larry            Wife is here. Flu vax at primary office. Wears CPAP every night about 6-7 hours (varies as to how long patient sleeps) CPAP is uncomfortable and he sleeps better without it. He dislikes the mask and the restraint of the tubing. We discussed alternatives. He never really felt daytime sleepiness and sees no improvement in that direction. Wife confirms he does not snore wearing CPAP. Download confirmed excellent compliance  With fair control at 9 CWP.  He feels comfortable about his breathing with no chest complaint and no cough or wheeze. CXR 08/21/11 IMPRESSION:  No acute infiltrate or pulmonary edema. Mild hyperinflation.  Linear scarring or atelectasis in the left base.  Original Report Authenticated By: Natasha Mead, M.D.   ROS-see HPI  Constitutional:   No-   weight loss, night sweats, fevers, chills, fatigue, lassitude. HEENT:   No-  headaches, difficulty swallowing, tooth/dental problems, sore throat,       Controlled  sneezing, itching, ear ache, nasal congestion, post nasal drip, + snoring CV:  No-   chest pain, orthopnea, PND, swelling in lower  extremities, anasarca, dizziness, palpitations Resp: + He notices little shortness of breath with exertion or at rest.              No-   productive cough,  No non-productive cough,  No- coughing up of blood.              No-   change in color of mucus.  No- wheezing.   Skin: No-   rash or lesions. GI:  No-   heartburn, indigestion, abdominal pain, nausea, vomiting, GU:  MS:  No-   joint pain or swelling.  Neuro-     nothing unusual Psych:  No- change in mood or affect. No depression or anxiety.  No memory loss.  OBJ- Physical Exam General- Alert, Oriented, Affect-appropriate, Distress- none acute, overweight Skin- rash-none, lesions- none, excoriation- none. Fragile skin ecchymoses on arms. Lymphadenopathy- none Head- atraumatic            Eyes- Gross vision intact, PERRLA, conjunctivae and secretions clear            Ears- Hearing aids            Nose- Clear, no-Septal dev, mucus, polyps, erosion, perforation             Throat- Mallampati IV , mucosa clear , drainage- none, tonsils- atrophic Neck- flexible , trachea midline, no stridor , thyroid nl, carotid no bruit Chest - symmetrical excursion , unlabored           Heart/CV- RRR , no murmur , no gallop  , no rub, nl s1 s2                           - JVD- none , edema- none, stasis changes- none, varices- none           Lung- +diminished , wheeze- none, cough- none , dullness-none, rub- none, unlabored           Chest wall-  Abd-  Br/ Gen/ Rectal- Not done, not indicated Extrem- cyanosis- none, clubbing, none, atrophy- none, strength- nl Neuro- grossly intact to observation

## 2011-12-20 NOTE — Patient Instructions (Addendum)
Order- referral to daytime Sleep Center staff for CPAP 9 CWP desensitization and mask fitting    Dx OSA  Booklet given explaining Sleep apnea and CPAP

## 2011-12-23 ENCOUNTER — Telehealth: Payer: Self-pay | Admitting: Internal Medicine

## 2011-12-23 DIAGNOSIS — G4733 Obstructive sleep apnea (adult) (pediatric): Secondary | ICD-10-CM

## 2011-12-23 NOTE — Telephone Encounter (Signed)
Pt's spouse says the pt cannot wear the new cpap mask; He wakes up too dried out. They are requesting that the pt be set up for a dental appliance. Dr. Maple Hudson pls advise.

## 2011-12-23 NOTE — Telephone Encounter (Signed)
order placed and pt spouse is aware.[name], CMA

## 2011-12-23 NOTE — Telephone Encounter (Signed)
Order- referral to Orthodontist Dr Althea Grimmer   For dx OSA, failed CPAP.   Need to send diagnostic sleep study.

## 2011-12-26 NOTE — Assessment & Plan Note (Signed)
He has made a very good effort with CPAP. We discussed alternatives, especially oral appliances. Plan-we are referring him over to the sleep disorders center daytime staff for CPAP desensitization and mask reassessment. If this doesn't help, he will contact us about referral for oral appliance.

## 2011-12-26 NOTE — Assessment & Plan Note (Signed)
Good symptomatic control. In spite of moderate COPD and history of MI in May of 2013, he paces himself and is not inconvenienced by dyspnea.

## 2012-01-17 ENCOUNTER — Telehealth: Payer: Self-pay | Admitting: Internal Medicine

## 2012-01-17 NOTE — Telephone Encounter (Signed)
Per CY-change f/u to PRN with him for OSA

## 2012-01-17 NOTE — Telephone Encounter (Signed)
I spoke wit spouse and she stated pt will be fitted for oral appliance 01/25/12. At the time pt's appt was made with Dr. Maple Hudson on 01/31/12 pt was trying out cpap. Since pt is getting oral appliance when should pt schedule his f/u appt with Dr. Maple Hudson. Please advise thanks

## 2012-01-17 NOTE — Telephone Encounter (Signed)
lmomtcb x1 

## 2012-01-18 NOTE — Telephone Encounter (Signed)
i spoke with pt and okay to f/u PRN. Nothing further was needed

## 2012-01-18 NOTE — Telephone Encounter (Signed)
Pt's spouse Jamesetta So) called back.  Call her @ [phone]. Leanora Ivanoff

## 2012-01-18 NOTE — Telephone Encounter (Signed)
ATC line busy x 3 wcb 

## 2012-01-31 ENCOUNTER — Ambulatory Visit: Payer: Medicare Other | Admitting: Internal Medicine

## 2012-02-14 ENCOUNTER — Telehealth: Payer: Self-pay | Admitting: Cardiology

## 2012-02-14 ENCOUNTER — Encounter: Payer: Self-pay | Admitting: Cardiology

## 2012-02-14 NOTE — Telephone Encounter (Signed)
New Problem:    Patient's wife called in because her husband has had two nose bleeds within the past three days, both lasting over an hour before the bleeding was stopped, and wanted to know how to proceed.  Patient is on prasugrel (EFFIENT) 10 MG TABS, was unable to get appointment to see PCP.  Please call back.

## 2012-02-14 NOTE — Telephone Encounter (Signed)
Should continue Effient if at all possible.  Could hold aspirin for 3-4 days.  Would use saline nasal spray to keep nasal passages moist.

## 2012-02-14 NOTE — Telephone Encounter (Signed)
Pt's wife advised, verbalized understanding.

## 2012-02-14 NOTE — Telephone Encounter (Signed)
Spoke with pt's wife. Pt had a nose bleed Friday night 9 PM that lasted until around 10 PM. Pt had another nose bleed this morning that took about 45 minutes to get under control. Pt did have a headache in top of head last week that was relieved with Tylenol.  Pt takes Effient 10mg  daily. Pt's wife is asking if this should be adjusted. I will forward to Dr Shirlee Latch for review. Pt plans on seeing his PCP today.

## 2012-02-25 ENCOUNTER — Encounter (INDEPENDENT_AMBULATORY_CARE_PROVIDER_SITE_OTHER): Payer: Medicare Other

## 2012-02-25 DIAGNOSIS — I6529 Occlusion and stenosis of unspecified carotid artery: Secondary | ICD-10-CM

## 2012-02-28 ENCOUNTER — Encounter: Payer: Self-pay | Admitting: Cardiology

## 2012-02-28 ENCOUNTER — Ambulatory Visit (INDEPENDENT_AMBULATORY_CARE_PROVIDER_SITE_OTHER): Payer: Medicare Other | Admitting: Cardiology

## 2012-02-28 VITALS — BP 118/70 | HR 87 | Ht 71.0 in | Wt 212.0 lb

## 2012-02-28 DIAGNOSIS — I2589 Other forms of chronic ischemic heart disease: Secondary | ICD-10-CM

## 2012-02-28 DIAGNOSIS — I2510 Atherosclerotic heart disease of native coronary artery without angina pectoris: Secondary | ICD-10-CM

## 2012-02-28 DIAGNOSIS — I255 Ischemic cardiomyopathy: Secondary | ICD-10-CM

## 2012-02-28 DIAGNOSIS — J449 Chronic obstructive pulmonary disease, unspecified: Secondary | ICD-10-CM

## 2012-02-28 DIAGNOSIS — I6529 Occlusion and stenosis of unspecified carotid artery: Secondary | ICD-10-CM

## 2012-02-28 NOTE — Patient Instructions (Addendum)
Your physician wants you to follow-up in: May 2014 with Dr Shirlee Latch. You will receive a reminder letter in the mail two months in advance. If you don't receive a letter, please call our office to schedule the follow-up appointment.

## 2012-02-28 NOTE — Progress Notes (Signed)
Patient ID: [name], male   DOB: [birth year], 76 y.o.   MRN: [id number] PCP: Dr. Rene Paci  76 yo with history of CAD s/p NSTEMI/PCI in 1/11 and unstable angina/PCI in 5/13 as well as L-spine stenosis with multiple prior back surgeries and COPD presents for cardiology followup.  He had 2 DES placed in the RCA in 1/11 at the time of his NSTEMI.  He was on Effient until 2/12.  He had a myoview in 8/11 that showed no ischemia or infarction.  This was done in anticipation of back surgery, but he did not have the surgery done.  Last echo in 9/12 showed EF 45-50%.  He was admitted with unstable angina in 5/13 and had DES to proximal LCX and cutting balloon angioplasty to proximal RCA in-stent restenosis.  EF at cath was 55%.   Since discharge in 5/13, he has been stable symptomatically.  He has not had any chest pain. He can walk on flat ground without dyspnea but is short of breath walking up a hill.  He can climb a flight of steps without problems.  He plays golf occasionally. Patient continues to have chronic low back pain and hip pain that is worse with standing and walking.  He has had numerous operations on his C-spine and L-spine.  He has L-spine stenosis.    He has been diagnosed with OSA but had trouble with CPAP.  He is using a mouthpiece.  He has also been having problems with nosebleed occasionally since Thanksgiving.  He uses Afrin for 2 days when he has a severe nosebleed.  He continues on Effient and ASA.  CBC has been stable at PCP's office.   Labs (3/12): K 5, creatinine 1.05, LDL 42, HDL 41 Labs (6/13): K 3.9, creatinine 1.04, LDL 44, HDL 67  PMH: 1. CAD: NSTEMI in 12/10.  Patient had DES x 2 to RCA on 03/17/09. Myoview in 8/11 showed EF 58%, no ischemia or infarction. Unstable angina in 5/13.  Patient had DES to proximal LCX and cutting balloon angioplasty to proximal RCA in-stent restenosis with 80-90% stenosis in a small OM1 also noted.  EF at cath was 55%.  2. Hyperlipidemia 3. CLL 4.  Erectile dysfunction 5. BPH 6. COPD 7. C-spine surgery after trauma in 2005 and 2008.  8. AAA: PCP follows with serial ultrasounds.  Has been stable, around 3 cm.  9. Appendectomy 10. GERD 11. L-spine disc disease and spinal stenosis with multiple surgeries.  Has chronic low back and leg pain.  12. Ischemic cardiomyopathy: echo (9/12) with EF 45-50%. LV-gram with EF 55% in 5/13.  13. Carotid stenosis: 6/13 carotids with 60-79% RICA stenosis.  12/13 40-59% RICA stenosis.  14. Hyperkalemia with ACEI 15. OSA: Intolerant of CPAP, uses mouthpiece.   SH: Stopped smoking in 2008.  Married, lives in [place].   FH: Father died of lung cancer.  Mother with dementia.   ROS: All systems reviewed and negative except as per HPI.   Current Outpatient Prescriptions  Medication Sig Dispense Refill  . albuterol (PROAIR HFA) 108 (90 BASE) MCG/ACT inhaler Inhale 2 puffs into the lungs every 6 (six) hours as needed. For shortness of breath      . aspirin 81 MG tablet Take 81 mg by mouth daily.       Marland Kitchen gabapentin (NEURONTIN) 300 MG capsule Take 600 mg by mouth 2 (two) times daily.       . hydrOXYzine (ATARAX/VISTARIL) 25 MG tablet Take 25 mg by mouth  every 8 (eight) hours as needed. For itching      . metoprolol succinate (TOPROL XL) 25 MG 24 hr tablet Take 1 tablet (25 mg total) by mouth daily.  30 tablet  6  . nitroGLYCERIN (NITROSTAT) 0.4 MG SL tablet Place 0.4 mg under the tongue every 5 (five) minutes as needed. For chest pain      . oxymetazoline (AFRIN) 0.05 % nasal spray Place into the nose as directed.      . prasugrel (EFFIENT) 10 MG TABS Take 1 tablet (10 mg total) by mouth daily.  30 tablet  11  . rosuvastatin (CRESTOR) 10 MG tablet Take 10 mg by mouth daily.       . Tamsulosin HCl (FLOMAX) 0.4 MG CAPS Take 0.4 mg by mouth daily.        . timolol (BETIMOL) 0.5 % ophthalmic solution Place 1 drop into both eyes 2 (two) times daily.      . TRAVATAN Z 0.004 % SOLN ophthalmic solution Place 1 drop  into both eyes at bedtime.         BP 118/70  Pulse 87  Ht 5\' 11"  (1.803 m)  Wt 212 lb (96.163 kg)  BMI 29.57 kg/m2  SpO2 96% General: NAD Neck: No JVD, no thyromegaly or thyroid nodule.  Lungs: Clear to auscultation bilaterally with normal respiratory effort. CV: Nondisplaced PMI.  Heart regular S1/S2, no S3/S4, no murmur.  No peripheral edema.  Soft right carotid bruit.  1+ PT pulses bilaterally. Bilateral lower leg venous varicosities.  Abdomen: Soft, nontender, no hepatosplenomegaly, no distention.  Neurologic: Alert and oriented x 3.  Psych: Normal affect. Extremities: No clubbing or cyanosis.   Assessment/Plan:  Epistaxis Periodic nose bleeds in setting of Effient and Aspirin.  Continue these meds for now.  He has been using Afrin x 2 days when he has a severe nosebleed.  I asked him to try to limit Afrin as much as possible given his coronary disease.  If he has a severe bleed, he can use it.  Would continue Effient but hold aspirin for 2-3 days with significant nosebleed.  I also want him to use saline nasal spray twice a day every day to keep his nasal passages moist.  He will need to continue Effient until 5/14.  CAD  Stable status post PCI in 5/13 with no exertional chest pain and stable exertional dyspnea. EF preserved on LV-gram. He will continue ASA 81 mg daily, Toprol XL 25 mg daily, Crestor, and Effient.  He has not been able to tolerate ACEI in the past due to hyperkalemia, and he has had fatigue with > 25 mg daily Toprol XL.  COPD I suspect that Mr Neiss's mild exertional dyspnea is due more to COPD than to his heart at this point.  Carotid stenosis  Stable mild to moderate RICA stenosis.  Followup carotids in 1 year.   Hyperlipidemia  LDL at goal (< 70) in 6/13. Continue current statin.   Marca Ancona 02/28/2012 9:47 AM

## 2012-06-20 ENCOUNTER — Encounter: Payer: Self-pay | Admitting: Cardiology

## 2012-07-11 ENCOUNTER — Other Ambulatory Visit: Payer: Self-pay | Admitting: Dermatology

## 2012-07-31 ENCOUNTER — Ambulatory Visit (INDEPENDENT_AMBULATORY_CARE_PROVIDER_SITE_OTHER): Payer: Medicare Other | Admitting: Cardiology

## 2012-07-31 ENCOUNTER — Encounter: Payer: Self-pay | Admitting: Cardiology

## 2012-07-31 VITALS — BP 110/70 | HR 69 | Ht 71.0 in | Wt 214.0 lb

## 2012-07-31 DIAGNOSIS — E785 Hyperlipidemia, unspecified: Secondary | ICD-10-CM

## 2012-07-31 DIAGNOSIS — J449 Chronic obstructive pulmonary disease, unspecified: Secondary | ICD-10-CM

## 2012-07-31 DIAGNOSIS — I2510 Atherosclerotic heart disease of native coronary artery without angina pectoris: Secondary | ICD-10-CM

## 2012-07-31 DIAGNOSIS — I6529 Occlusion and stenosis of unspecified carotid artery: Secondary | ICD-10-CM

## 2012-07-31 NOTE — Progress Notes (Signed)
Patient ID: HRIDAY STAI, male   DOB: 1935/04/30, 77 y.o.   MRN: [id number] PCP: Dr. Donette Larry  77 yo with history of CAD s/p NSTEMI/PCI in 1/11 and unstable angina/PCI in 5/13 as well as L-spine stenosis with multiple prior back surgeries and COPD presents for cardiology followup.  He had 2 DES placed in the RCA in 1/11 at the time of his NSTEMI.  He was on Effient until 2/12.  He had a myoview in 8/11 that showed no ischemia or infarction.  This was done in anticipation of back surgery, but he did not have the surgery done.  Last echo in 9/12 showed EF 45-50%.  He was admitted with unstable angina in 5/13 and had DES to proximal LCX and cutting balloon angioplasty to proximal RCA in-stent restenosis.  EF at cath was 55%.   Since last appointment, he has been stable symptomatically.  He bruises easily with the Effient.  He has not had any chest pain. He can walk on flat ground without dyspnea but is short of breath walking up a hill.  He can climb a flight of steps without problems. Patient continues to have chronic low back pain and hip pain that is worse with standing and walking.  He has had numerous operations on his C-spine and L-spine.  He has L-spine stenosis.    He has been diagnosed with OSA but had trouble with CPAP.  He is using a dental appliance with success.     Labs (3/12): K 5, creatinine 1.05, LDL 42, HDL 41 Labs (6/13): K 3.9, creatinine 1.04, LDL 44, HDL 67 Labs (4/14): K 5.2, creatinine 1.06, LDL 46, HDL 42  PMH: 1. CAD: NSTEMI in 12/10.  Patient had DES x 2 to RCA on 03/17/09. Myoview in 8/11 showed EF 58%, no ischemia or infarction. Unstable angina in 5/13.  Patient had DES to proximal LCX and cutting balloon angioplasty to proximal RCA in-stent restenosis with 80-90% stenosis in a small OM1 also noted.  EF at cath was 55%.  2. Hyperlipidemia 3. CLL 4. Erectile dysfunction 5. BPH 6. COPD 7. C-spine surgery after trauma in 2005 and 2008.  8. AAA: PCP follows with serial  ultrasounds.  Has been stable, around 3 cm.  9. Appendectomy 10. GERD 11. L-spine disc disease and spinal stenosis with multiple surgeries.  Has chronic low back and leg pain.  12. Ischemic cardiomyopathy: echo (9/12) with EF 45-50%. LV-gram with EF 55% in 5/13.  13. Carotid stenosis: 6/13 carotids with 60-79% RICA stenosis.  12/13 40-59% RICA stenosis.  14. Hyperkalemia with ACEI 15. OSA: Intolerant of CPAP, uses mouthpiece.   SH: Stopped smoking in 2008.  Married, lives in [place].   FH: Father died of lung cancer.  Mother with dementia.    Current Outpatient Prescriptions  Medication Sig Dispense Refill  . albuterol (PROAIR HFA) 108 (90 BASE) MCG/ACT inhaler Inhale 2 puffs into the lungs every 6 (six) hours as needed. For shortness of breath      . aspirin 81 MG tablet Take 81 mg by mouth daily.       Marland Kitchen gabapentin (NEURONTIN) 300 MG capsule Take 600 mg by mouth 2 (two) times daily.       . hydrOXYzine (ATARAX/VISTARIL) 25 MG tablet Take 25 mg by mouth every 8 (eight) hours as needed. For itching      . metoprolol succinate (TOPROL XL) 25 MG 24 hr tablet Take 1 tablet (25 mg total) by mouth daily.  30 tablet  6  . nitroGLYCERIN (NITROSTAT) 0.4 MG SL tablet Place 0.4 mg under the tongue every 5 (five) minutes as needed. For chest pain      . oxymetazoline (AFRIN) 0.05 % nasal spray Place into the nose as directed.      . rosuvastatin (CRESTOR) 10 MG tablet Take 10 mg by mouth daily.       . Tamsulosin HCl (FLOMAX) 0.4 MG CAPS Take 0.4 mg by mouth daily.        . timolol (BETIMOL) 0.5 % ophthalmic solution Place 1 drop into both eyes 2 (two) times daily.      . TRAVATAN Z 0.004 % SOLN ophthalmic solution Place 1 drop into both eyes at bedtime.        No current facility-administered medications for this visit.    BP 110/70  Pulse 69  Ht 5\' 11"  (1.803 m)  Wt 214 lb (97.07 kg)  BMI 29.86 kg/m2  SpO2 96% General: NAD Neck: No JVD, no thyromegaly or thyroid nodule.  Lungs: Clear to  auscultation bilaterally with normal respiratory effort. CV: Nondisplaced PMI.  Heart regular S1/S2, no S3/S4, no murmur.  No peripheral edema.  Soft right carotid bruit.  1+ PT pulses bilaterally. Bilateral lower leg venous varicosities.  Abdomen: Soft, nontender, no hepatosplenomegaly, no distention.  Neurologic: Alert and oriented x 3.  Psych: Normal affect. Extremities: No clubbing or cyanosis.   Assessment/Plan:  CAD  Stable status post PCI in 5/13 with no exertional chest pain and stable exertional dyspnea. EF preserved on LV-gram. He will continue ASA 81 mg daily, Toprol XL 25 mg daily, Crestor.  He has not been able to tolerate ACEI in the past due to hyperkalemia, and he has had fatigue with > 25 mg daily Toprol XL.  He can stop his Effient at the end of the month.  COPD I suspect that Mr Camberos's mild exertional dyspnea is due more to COPD than to his heart at this point.  Carotid stenosis  Stable mild to moderate RICA stenosis.  Followup carotids in 12/14.  Hyperlipidemia  Good lipids in 4/14. Continue current statin.   Marca Ancona 07/31/2012 4:18 PM

## 2012-07-31 NOTE — Patient Instructions (Addendum)
You can stop Effient.   Your physician wants you to follow-up in: 6 months with Dr Shirlee Latch. (November 2014).  You will receive a reminder letter in the mail two months in advance. If you don't receive a letter, please call our office to schedule the follow-up appointment.

## 2012-08-10 ENCOUNTER — Telehealth: Payer: Self-pay | Admitting: Cardiology

## 2012-08-10 NOTE — Telephone Encounter (Signed)
New problem   Pt want to know how long after he go off his Effient does he have to wait to get his teeth clean.

## 2012-08-10 NOTE — Telephone Encounter (Signed)
Reviewed with Dr Shirlee Latch. Pt told at time of office visit with Dr Shirlee Latch 07/31/12 he could stop effient at the end of May. In general Dr Shirlee Latch does not recommend holding effient teeth cleaning. LMTCB for pt.

## 2012-08-11 NOTE — Telephone Encounter (Signed)
Spoke with patient's wife. Pt discontinued Effient 2 days ago. Pt aware Ok to have teeth cleaned.

## 2012-11-03 ENCOUNTER — Other Ambulatory Visit: Payer: Self-pay | Admitting: Internal Medicine

## 2012-11-03 DIAGNOSIS — R519 Headache, unspecified: Secondary | ICD-10-CM

## 2012-11-03 DIAGNOSIS — R42 Dizziness and giddiness: Secondary | ICD-10-CM

## 2012-11-07 ENCOUNTER — Ambulatory Visit
Admission: RE | Admit: 2012-11-07 | Discharge: 2012-11-07 | Disposition: A | Discharge status: Home / Self Care | Payer: Medicare Other | Source: Ambulatory Visit | Attending: Internal Medicine | Admitting: Internal Medicine

## 2012-11-07 DIAGNOSIS — R42 Dizziness and giddiness: Secondary | ICD-10-CM

## 2012-11-07 DIAGNOSIS — R519 Headache, unspecified: Secondary | ICD-10-CM

## 2013-01-02 ENCOUNTER — Ambulatory Visit: Payer: Medicare Other | Attending: Internal Medicine | Admitting: Physical Therapy

## 2013-01-02 DIAGNOSIS — R269 Unspecified abnormalities of gait and mobility: Secondary | ICD-10-CM | POA: Insufficient documentation

## 2013-01-02 DIAGNOSIS — IMO0001 Reserved for inherently not codable concepts without codable children: Secondary | ICD-10-CM | POA: Insufficient documentation

## 2013-01-02 DIAGNOSIS — H8110 Benign paroxysmal vertigo, unspecified ear: Secondary | ICD-10-CM | POA: Insufficient documentation

## 2013-01-29 ENCOUNTER — Encounter: Payer: Self-pay | Admitting: *Deleted

## 2013-01-29 ENCOUNTER — Encounter: Payer: Self-pay | Admitting: Cardiology

## 2013-01-29 ENCOUNTER — Ambulatory Visit (INDEPENDENT_AMBULATORY_CARE_PROVIDER_SITE_OTHER): Payer: Medicare Other | Admitting: Cardiology

## 2013-01-29 VITALS — BP 109/73 | HR 87 | Ht 71.0 in | Wt 217.0 lb

## 2013-01-29 DIAGNOSIS — I6529 Occlusion and stenosis of unspecified carotid artery: Secondary | ICD-10-CM

## 2013-01-29 DIAGNOSIS — R0602 Shortness of breath: Secondary | ICD-10-CM

## 2013-01-29 DIAGNOSIS — I2510 Atherosclerotic heart disease of native coronary artery without angina pectoris: Secondary | ICD-10-CM

## 2013-01-29 DIAGNOSIS — E785 Hyperlipidemia, unspecified: Secondary | ICD-10-CM

## 2013-01-29 DIAGNOSIS — I6521 Occlusion and stenosis of right carotid artery: Secondary | ICD-10-CM

## 2013-01-29 DIAGNOSIS — J449 Chronic obstructive pulmonary disease, unspecified: Secondary | ICD-10-CM

## 2013-01-29 LAB — LIPID PANEL
Cholesterol: 100 mg/dL (ref 0–200)
HDL: 40.4 mg/dL (ref >39.00)
LDL Cholesterol: 33 mg/dL (ref 0–99)
Total CHOL/HDL Ratio: 2
Triglycerides: 131 mg/dL (ref 0.0–149.0)
VLDL: 26.2 mg/dL (ref 0.0–40.0)

## 2013-01-29 NOTE — Progress Notes (Signed)
Patient ID: [name], male   DOB: 1935-10-02, 77 y.o.   MRN: [id number] PCP: Dr. Donette Larry  77 yo with history of CAD s/p NSTEMI/PCI in 1/11 and unstable angina/PCI in 5/13 as well as L-spine stenosis with multiple prior back surgeries and COPD presents for cardiology followup.  He had 2 DES placed in the RCA in 1/11 at the time of his NSTEMI.  He was on Effient until 2/12.  He had a myoview in 8/11 that showed no ischemia or infarction.  This was done in anticipation of back surgery, but he did not have the surgery done.  Last echo in 9/12 showed EF 45-50%.  He was admitted with unstable angina in 5/13 and had DES to proximal LCX and cutting balloon angioplasty to proximal RCA in-stent restenosis.  EF at cath was 55%.   Since last appointment, he has been stable symptomatically.   He has not had any chest pain.  He gets some right upper quadrant sharp pain occasionally.  He can walk on flat ground without dyspnea at a slow pace but is short of breath walking up a hill or if he walks fast.  This is chronic.  Patient continues to have chronic low back pain and hip pain that is worse with standing and walking.  He has had numerous operations on his C-spine and L-spine.  He has L-spine stenosis.  He is having frequent PVCs but does not feel them.   He has been diagnosed with OSA but had trouble with CPAP.  He is using a dental appliance with success.     Labs (3/12): K 5, creatinine 1.05, LDL 42, HDL 41 Labs (6/13): K 3.9, creatinine 1.04, LDL 44, HDL 67 Labs (4/14): K 5.2, creatinine 1.06, LDL 46, HDL 42  PMH: 1. CAD: NSTEMI in 12/10.  Patient had DES x 2 to RCA on 03/17/09. Myoview in 8/11 showed EF 58%, no ischemia or infarction. Unstable angina in 5/13.  Patient had DES to proximal LCX and cutting balloon angioplasty to proximal RCA in-stent restenosis with 80-90% stenosis in a small OM1 also noted.  EF at cath was 55%.  2. Hyperlipidemia 3. CLL 4. Erectile dysfunction 5. BPH 6. COPD 7. C-spine  surgery after trauma in 2005 and 2008.  8. AAA: PCP follows with serial ultrasounds.  Has been stable, around 3 cm.  9. Appendectomy 10. GERD 11. L-spine disc disease and spinal stenosis with multiple surgeries.  Has chronic low back and leg pain.  12. Ischemic cardiomyopathy: echo (9/12) with EF 45-50%. LV-gram with EF 55% in 5/13.  13. Carotid stenosis: 6/13 carotids with 60-79% RICA stenosis.  12/13 40-59% RICA stenosis.  14. Hyperkalemia with ACEI 15. OSA: Intolerant of CPAP, uses mouthpiece.  16. PVCs  SH: Stopped smoking in 2008.  Married, lives in [place].   FH: Father died of lung cancer.  Mother with dementia.    Current Outpatient Prescriptions  Medication Sig Dispense Refill  . albuterol (PROAIR HFA) 108 (90 BASE) MCG/ACT inhaler Inhale 2 puffs into the lungs every 6 (six) hours as needed. For shortness of breath      . aspirin 81 MG tablet Take 81 mg by mouth daily.       Marland Kitchen gabapentin (NEURONTIN) 300 MG capsule Take 600 mg by mouth 2 (two) times daily.       . hydrOXYzine (ATARAX/VISTARIL) 25 MG tablet Take 25 mg by mouth every 8 (eight) hours as needed. For itching      .  metoprolol succinate (TOPROL XL) 25 MG 24 hr tablet Take 1 tablet (25 mg total) by mouth daily.  30 tablet  6  . nitroGLYCERIN (NITROSTAT) 0.4 MG SL tablet Place 0.4 mg under the tongue every 5 (five) minutes as needed. For chest pain      . oxymetazoline (AFRIN) 0.05 % nasal spray Place into the nose as directed.      . rosuvastatin (CRESTOR) 10 MG tablet Take 10 mg by mouth daily.       . Tamsulosin HCl (FLOMAX) 0.4 MG CAPS Take 0.4 mg by mouth daily.        . timolol (BETIMOL) 0.5 % ophthalmic solution Place 1 drop into both eyes 2 (two) times daily.      . TRAVATAN Z 0.004 % SOLN ophthalmic solution Place 1 drop into both eyes at bedtime.        No current facility-administered medications for this visit.    BP 109/73  Pulse 87  Ht 5\' 11"  (1.803 m)  Wt 217 lb (98.431 kg)  BMI 30.28  kg/m2 General: NAD Neck: No JVD, no thyromegaly or thyroid nodule.  Lungs: Clear to auscultation bilaterally with normal respiratory effort. CV: Nondisplaced PMI.  Heart regular S1/S2, no S3/S4, no murmur.  No peripheral edema.  Soft right carotid bruit.  1+ PT pulses bilaterally. Bilateral lower leg venous varicosities.  Abdomen: Soft, nontender, no hepatosplenomegaly, no distention.  Neurologic: Alert and oriented x 3.  Psych: Normal affect. Extremities: No clubbing or cyanosis.   Assessment/Plan:  CAD  Stable status post PCI in 5/13 with no exertional chest pain and stable exertional dyspnea.  He will continue ASA 81 mg daily, Toprol XL 25 mg daily, Crestor.  He has not been able to tolerate ACEI in the past due to hyperkalemia, and he has had fatigue with > 25 mg daily Toprol XL.  Given ongoing exertional dyspnea and frequent PVCs, I would like to assess LV function with an echocardiogram (last echo in 9/12).  COPD I suspect that Mr Direnzo's exertional dyspnea is due more to COPD than to his heart.  As above, I am going to have him do an echocardiogram.  Carotid stenosis  Stable mild to moderate RICA stenosis.  Followup carotids in 12/14.  Hyperlipidemia  Check lipids today.   Marca Ancona 01/29/2013 8:56 AM

## 2013-01-29 NOTE — Patient Instructions (Signed)
Your physician recommends that you have  a FASTING lipid profile: today.  Your physician has requested that you have an echocardiogram. Echocardiography is a painless test that uses sound waves to create images of your heart. It provides your doctor with information about the size and shape of your heart and how well your heart's chambers and valves are working. This procedure takes approximately one hour. There are no restrictions for this procedure.  Your physician has requested that you have a carotid duplex. This test is an ultrasound of the carotid arteries in your neck. It looks at blood flow through these arteries that supply the brain with blood. Allow one hour for this exam. There are no restrictions or special instructions. December 2014  Your physician wants you to follow-up in: 6 months with Dr Shirlee Latch. (May 2015).  You will receive a reminder letter in the mail two months in advance. If you don't receive a letter, please call our office to schedule the follow-up appointment.

## 2013-02-28 ENCOUNTER — Ambulatory Visit (HOSPITAL_BASED_OUTPATIENT_CLINIC_OR_DEPARTMENT_OTHER): Payer: Medicare Other | Admitting: Radiology

## 2013-02-28 ENCOUNTER — Ambulatory Visit (HOSPITAL_COMMUNITY): Payer: Medicare Other | Attending: Cardiology

## 2013-02-28 ENCOUNTER — Encounter: Payer: Self-pay | Admitting: *Deleted

## 2013-02-28 DIAGNOSIS — I2589 Other forms of chronic ischemic heart disease: Secondary | ICD-10-CM

## 2013-02-28 DIAGNOSIS — I2510 Atherosclerotic heart disease of native coronary artery without angina pectoris: Secondary | ICD-10-CM

## 2013-02-28 DIAGNOSIS — E785 Hyperlipidemia, unspecified: Secondary | ICD-10-CM

## 2013-02-28 DIAGNOSIS — I6529 Occlusion and stenosis of unspecified carotid artery: Secondary | ICD-10-CM | POA: Insufficient documentation

## 2013-02-28 DIAGNOSIS — R0602 Shortness of breath: Secondary | ICD-10-CM

## 2013-02-28 DIAGNOSIS — I6521 Occlusion and stenosis of right carotid artery: Secondary | ICD-10-CM

## 2013-02-28 DIAGNOSIS — I255 Ischemic cardiomyopathy: Secondary | ICD-10-CM

## 2013-02-28 NOTE — Progress Notes (Signed)
Echocardiogram performed.  

## 2013-06-22 ENCOUNTER — Other Ambulatory Visit: Payer: Self-pay | Admitting: Internal Medicine

## 2013-06-22 DIAGNOSIS — I714 Abdominal aortic aneurysm, without rupture, unspecified: Secondary | ICD-10-CM

## 2013-06-25 ENCOUNTER — Encounter: Payer: Self-pay | Admitting: Cardiology

## 2013-07-03 ENCOUNTER — Ambulatory Visit
Admission: RE | Admit: 2013-07-03 | Discharge: 2013-07-03 | Disposition: A | Discharge status: Home / Self Care | Payer: Medicare Other | Source: Ambulatory Visit | Attending: Internal Medicine | Admitting: Internal Medicine

## 2013-07-03 DIAGNOSIS — I714 Abdominal aortic aneurysm, without rupture, unspecified: Secondary | ICD-10-CM

## 2013-09-03 ENCOUNTER — Encounter (INDEPENDENT_AMBULATORY_CARE_PROVIDER_SITE_OTHER): Payer: Self-pay

## 2013-09-03 ENCOUNTER — Encounter: Payer: Self-pay | Admitting: Cardiology

## 2013-09-03 ENCOUNTER — Encounter: Payer: Self-pay | Admitting: *Deleted

## 2013-09-03 ENCOUNTER — Ambulatory Visit (INDEPENDENT_AMBULATORY_CARE_PROVIDER_SITE_OTHER): Payer: Medicare Other | Admitting: Cardiology

## 2013-09-03 VITALS — BP 96/76 | HR 78 | Ht 71.25 in | Wt 215.4 lb

## 2013-09-03 DIAGNOSIS — I255 Ischemic cardiomyopathy: Secondary | ICD-10-CM

## 2013-09-03 DIAGNOSIS — I6522 Occlusion and stenosis of left carotid artery: Secondary | ICD-10-CM

## 2013-09-03 DIAGNOSIS — E785 Hyperlipidemia, unspecified: Secondary | ICD-10-CM

## 2013-09-03 DIAGNOSIS — I2510 Atherosclerotic heart disease of native coronary artery without angina pectoris: Secondary | ICD-10-CM

## 2013-09-03 DIAGNOSIS — I6529 Occlusion and stenosis of unspecified carotid artery: Secondary | ICD-10-CM

## 2013-09-03 DIAGNOSIS — I2589 Other forms of chronic ischemic heart disease: Secondary | ICD-10-CM

## 2013-09-03 NOTE — Patient Instructions (Addendum)
Your physician has requested that you have a carotid duplex. This test is an ultrasound of the carotid arteries in your neck. It looks at blood flow through these arteries that supply the brain with blood. Allow one hour for this exam. There are no restrictions or special instructions.December 2015  Schedule an appointment for an overnight pulse oximetry.  Your physician wants you to follow-up in: 6 months with Dr Aundra Dubin. (December 2015).  You will receive a reminder letter in the mail two months in advance. If you don't receive a letter, please call our office to schedule the follow-up appointment.

## 2013-09-04 NOTE — Progress Notes (Signed)
Patient ID: [name], male   DOB: Mar 04, 1936, 78 y.o.   MRN: [id number] PCP: Dr. Lysle [name]  78 yo with history of CAD s/p NSTEMI/PCI in 1/11 and unstable angina/PCI in 5/13 as well as L-spine stenosis with multiple prior back surgeries and COPD presents for cardiology followup.  He had 2 DES placed in the RCA in 1/11 at the time of his NSTEMI.  He was on Effient until 2/12.  He had a myoview in 8/11 that showed no ischemia or infarction.  He was admitted with unstable angina in 5/13 and had DES to proximal LCX and cutting balloon angioplasty to proximal RCA in-stent restenosis.  Last echo in 12/14 showed EF 45-50%.   Since last appointment, he has been stable symptomatically.   He has not had any chest pain. He can walk on flat ground without dyspnea at a slow pace but is short of breath walking up a hill or if he walks fast.  This is chronic.  Patient continues to have chronic low back pain and hip pain that is worse with standing and walking.  He has had numerous operations on his C-spine and L-spine.  He has L-spine stenosis.  He has had frequent PVCs chronically but does not feel them.  He has been unable to tolerate any inhalers other than albuterol for his COPD due to throat swelling.  BP is lower than normal for him today but he denies lightheadedness.   He has been diagnosed with OSA but had trouble with CPAP.  He is using a dental appliance with success.   Given his COPD, I walked him in the office today.  Oxygen saturation started at 89% but increased to 93% during ambulation.    Labs (3/12): K 5, creatinine 1.05, LDL 42, HDL 41 Labs (6/13): K 3.9, creatinine 1.04, LDL 44, HDL 67 Labs (4/14): K 5.2, creatinine 1.06, LDL 46, HDL 42 Labs (4/15): K 4.6, creatinine 1.02, LDL 30, HDL 39  PMH: 1. CAD: NSTEMI in 12/10.  Patient had DES x 2 to RCA on 03/17/09. Myoview in 8/11 showed EF 58%, no ischemia or infarction. Unstable angina in 5/13.  Patient had DES to proximal LCX and cutting balloon  angioplasty to proximal RCA in-stent restenosis with 80-90% stenosis in a small OM1 also noted.  EF at cath was 55%.  2. Hyperlipidemia 3. CLL 4. Erectile dysfunction 5. BPH 6. COPD 7. C-spine surgery after trauma in 2005 and 2008.  8. AAA: PCP follows with serial ultrasounds.  Aortic US (4/15) with 3.2 cm AAA. 9. Appendectomy 10. GERD 11. L-spine disc disease and spinal stenosis with multiple surgeries.  Has chronic low back and leg pain.  12. Ischemic cardiomyopathy: echo (9/12) with EF 45-50%. LV-gram with EF 55% in 5/13.  Echo (12/14) with EF 45-50%.  13. Carotid stenosis: 6/13 carotids with 60-79% RICA stenosis.  12/13 40-59% RICA stenosis.  19/50 93-26% LICA stenosis.  14. Hyperkalemia with ACEI 15. OSA: Intolerant of CPAP, uses mouthpiece.  16. PVCs  SH: Stopped smoking in 2008.  Married, lives in [place].   [place]: Father died of lung cancer.  Mother with dementia.   ROS: All systems reviewed and negative except as per HPI.    Current Outpatient Prescriptions  Medication Sig Dispense Refill  . albuterol (PROAIR HFA) 108 (90 BASE) MCG/ACT inhaler Inhale 2 puffs into the lungs every 6 (six) hours as needed. For shortness of breath      . aspirin 81 MG tablet Take 81 mg  by mouth daily.       Marland Kitchen gabapentin (NEURONTIN) 300 MG capsule Take 600 mg by mouth 2 (two) times daily.       Marland Kitchen latanoprost (XALATAN) 0.005 % ophthalmic solution Place 1 drop into both eyes at bedtime.       . metoprolol succinate (TOPROL-XL) 25 MG 24 hr tablet Take 25 mg by mouth daily.      . nitroGLYCERIN (NITROSTAT) 0.4 MG SL tablet Place 0.4 mg under the tongue every 5 (five) minutes as needed. For chest pain      . rosuvastatin (CRESTOR) 10 MG tablet Take 10 mg by mouth daily.       . Tamsulosin HCl (FLOMAX) 0.4 MG CAPS Take 0.4 mg by mouth daily.        . timolol (BETIMOL) 0.5 % ophthalmic solution Place 1 drop into both eyes 2 (two) times daily.       No current facility-administered medications for this  visit.    BP 96/76  Pulse 78  Ht 5' 11.25" (1.81 m)  Wt 215 lb 6.4 oz (97.705 kg)  BMI 29.82 kg/m2 General: NAD Neck: No JVD, no thyromegaly or thyroid nodule.  Lungs: Clear to auscultation bilaterally with normal respiratory effort. CV: Nondisplaced PMI.  Heart regular S1/S2, no S3/S4, no murmur.  No peripheral edema.  Soft right carotid bruit.  1+ PT pulses bilaterally. Bilateral lower leg venous varicosities.  Abdomen: Soft, nontender, no hepatosplenomegaly, no distention.  Neurologic: Alert and oriented x 3.  Psych: Normal affect. Extremities: No clubbing or cyanosis.   Assessment/Plan:  CAD  Stable status post PCI in 5/13 with no exertional chest pain and stable exertional dyspnea.  He will continue ASA 81 mg daily, Toprol XL 25 mg daily, Crestor.  He has not been able to tolerate ACEI in the past due to hyperkalemia, and he has had fatigue with > 25 mg daily Toprol XL.  EF on recent echo was stable at 45-50%.  COPD I suspect that [name]'s exertional dyspnea is due more to COPD than to his heart.  His oxygen saturation is on the low side but does not qualify for home oxygen by measure in the office today.  I am going to set him up for overnight oximetry. Carotid stenosis  Stable mild to moderate RICA stenosis.  Followup carotids in 12/15.  Hyperlipidemia  Lipids at goal in 4/15.   [name] 09/04/2013

## 2013-09-10 ENCOUNTER — Encounter (HOSPITAL_COMMUNITY): Payer: Medicare Other

## 2013-09-19 ENCOUNTER — Telehealth: Payer: Self-pay | Admitting: Cardiology

## 2013-09-19 NOTE — Telephone Encounter (Signed)
**Note De-Identified Alyssamarie Mounsey Obfuscation** Order signed by DOD, Dr Mare Ferrari with note asking [facility] to send results to Dr Aundra Dubin. Order faxed to [phone] and I did receive conformation that the fax went through successfully.

## 2013-09-19 NOTE — Telephone Encounter (Signed)
New message     Wife says we did not fax to [facility] an order for an overnight oximetry for respiratory care. Fax to [phone].  Please call pt so that they will know it has been done.

## 2013-10-02 ENCOUNTER — Telehealth: Payer: Self-pay | Admitting: *Deleted

## 2013-10-02 NOTE — Telephone Encounter (Signed)
Dr Aundra Dubin received and reviewed overnight pulse oximetry. Dr Aundra Dubin recommends oxygen at night 2L/min. A signed order for this has been faxed to [facility]. LMVM for pt with this information.

## 2013-10-03 ENCOUNTER — Telehealth: Payer: Self-pay | Admitting: Cardiology

## 2013-10-03 NOTE — Telephone Encounter (Signed)
°  Patient is calling back, he got a call from [facility] yesterday. He has several questions about his oxygen level. Questions about receiving oxygen from [facility]? Please call patient and advise.

## 2013-10-03 NOTE — Telephone Encounter (Signed)
Returning Anne's call from yesterday.  Just had a few questions regarding O2.  Answered questions and advised that [facility] should be getting in touch with him.  If he has any further questions advised to call us.

## 2013-10-04 NOTE — Telephone Encounter (Signed)
Pt states he has been contacted to have equipment delivered today.

## 2013-10-12 ENCOUNTER — Telehealth: Payer: Self-pay | Admitting: Cardiology

## 2013-10-12 DIAGNOSIS — R0600 Dyspnea, unspecified: Secondary | ICD-10-CM

## 2013-10-12 DIAGNOSIS — R0609 Other forms of dyspnea: Secondary | ICD-10-CM

## 2013-10-12 NOTE — Telephone Encounter (Signed)
Pt advised,verbalzied understanding.

## 2013-10-12 NOTE — Telephone Encounter (Signed)
With his exertional dyspnea and history, would suggest that he have an echo and an ETT-Cardiolite done then followup in the office after that.

## 2013-10-12 NOTE — Telephone Encounter (Signed)
Pt states gradually in the last 2 weeks he has noticed decrease in exercise tolerance. His legs seem to get tired and give out. He denies any chest discomfort or increased SOB with exertion but does note a "gas bubble" at his left  lung every 3-4 days, always at rest, that lasts 4-5 minutes.  He was using a saw to cut wood blocks in his garage this morning and was sweating a lot by the time he finished. He was also putting some blocks on window treatments later and he states he was SOB after doing that.   He states his BP has been 128/?, pulse in the 80s, he does not weigh daily, but does not feel he is holding on to extra fluid.   He is concerned that it may be something with his heart. He has a friend that just had stents placed.   I will forward to Dr Aundra Dubin.

## 2013-10-12 NOTE — Telephone Encounter (Signed)
New problem   Pt breaks out a sweat just walking in the house, feel like he can't walk on his legs and having sob. Pt isn't having any chest pain.

## 2013-10-16 ENCOUNTER — Encounter: Payer: Self-pay | Admitting: Cardiology

## 2013-10-17 ENCOUNTER — Telehealth: Payer: Self-pay | Admitting: Cardiology

## 2013-10-17 ENCOUNTER — Ambulatory Visit (HOSPITAL_COMMUNITY): Payer: Medicare Other | Attending: Cardiovascular Disease | Admitting: Radiology

## 2013-10-17 VITALS — BP 123/79 | HR 69 | Ht 71.0 in | Wt 217.0 lb

## 2013-10-17 DIAGNOSIS — R0602 Shortness of breath: Secondary | ICD-10-CM

## 2013-10-17 DIAGNOSIS — Z87891 Personal history of nicotine dependence: Secondary | ICD-10-CM | POA: Diagnosis not present

## 2013-10-17 DIAGNOSIS — I252 Old myocardial infarction: Secondary | ICD-10-CM | POA: Diagnosis not present

## 2013-10-17 DIAGNOSIS — R42 Dizziness and giddiness: Secondary | ICD-10-CM | POA: Diagnosis not present

## 2013-10-17 DIAGNOSIS — R0609 Other forms of dyspnea: Secondary | ICD-10-CM | POA: Insufficient documentation

## 2013-10-17 DIAGNOSIS — R0989 Other specified symptoms and signs involving the circulatory and respiratory systems: Secondary | ICD-10-CM | POA: Diagnosis not present

## 2013-10-17 DIAGNOSIS — I2510 Atherosclerotic heart disease of native coronary artery without angina pectoris: Secondary | ICD-10-CM | POA: Diagnosis not present

## 2013-10-17 DIAGNOSIS — R0600 Dyspnea, unspecified: Secondary | ICD-10-CM

## 2013-10-17 DIAGNOSIS — R079 Chest pain, unspecified: Secondary | ICD-10-CM

## 2013-10-17 DIAGNOSIS — I739 Peripheral vascular disease, unspecified: Secondary | ICD-10-CM | POA: Insufficient documentation

## 2013-10-17 MED ORDER — TECHNETIUM TC 99M SESTAMIBI GENERIC - CARDIOLITE
30.0000 | Freq: Once | INTRAVENOUS | Status: AC | PRN
  Administered 2013-10-17: 30 via INTRAVENOUS

## 2013-10-17 MED ORDER — TECHNETIUM TC 99M SESTAMIBI GENERIC - CARDIOLITE
10.0000 | Freq: Once | INTRAVENOUS | Status: AC | PRN
  Administered 2013-10-17: 10 via INTRAVENOUS

## 2013-10-17 MED ORDER — REGADENOSON 0.4 MG/5ML IV SOLN
0.4000 mg | Freq: Once | INTRAVENOUS | Status: AC
  Administered 2013-10-17: 0.4 mg via INTRAVENOUS

## 2013-10-17 NOTE — Telephone Encounter (Signed)
New Message  Pt wife called states they will have to cancel the ECHO because [place] [facility] is dening the requests to have the ECHO completed.. will need a fast appeal.. cancel the appt until they can get an appeal from nurse/Dr. Aundra Dubin.

## 2013-10-17 NOTE — Progress Notes (Addendum)
[facility] [address] [place], [place] [address] [phone]    Cardiology Nuclear Med Study  [name] is a 78 y.o. male     MRN : [id number]     DOB: Jan 25, 1936  Procedure Date: 10/17/2013  Nuclear Med Background Indication for Stress Test:  Evaluation for Ischemia and Stent Patency History: CAD, MI, Stent, 2011 Myocardial Perfusion Imaging-Normal, EF=58%, and 2014 Echo: EF=45-50% Cardiac Risk Factors: Carotid Disease, History of Smoking, Lipids and PVD  Symptoms: Chest Pain without exertion (last occurrence 5 days ago), Dizziness, DOE and SOB   Nuclear Pre-Procedure Caffeine/Decaff Intake:  None > 12 hrs NPO After: 6:30pm   Lungs:  clear O2 Sat: 98% on room air. IV 0.9% NS with Angio Cath:  22g  IV Site: R Hand x 1, tolerated well IV Started by:  Irven Baltimore, RN  Chest Size (in):  50 Cup Size: n/a  Height: 5\' 11"  (1.803 m)  Weight:  217 lb (98.431 kg)  BMI:  Body mass index is 30.28 kg/(m^2). Tech Comments:  Patient held Toprol x 24 hrs. Irven Baltimore, RN.    Nuclear Med Study 1 or 2 day study: 1 day  Stress Test Type:  Treadmill/Lexiscan  Reading MD: N/A  Order Authorizing Provider:  Loralie Champagne, MD  Resting Radionuclide: Technetium 20m Sestamibi  Resting Radionuclide Dose: 11.0 mCi   Stress Radionuclide:  Technetium 93m Sestamibi  Stress Radionuclide Dose: 33.0 mCi           Stress Protocol Rest HR: 69 Stress HR: 111  Rest BP: 123/79 Stress BP: 149/81  Exercise Time (min): 6:30 METS: n/a   Predicted Max HR: 145 bpm % Max HR: 76.55 bpm Rate Pressure Product: 16539   Dose of Adenosine (mg):  n/a Dose of Lexiscan: 0.4 mg  Dose of Atropine (mg): n/a Dose of Dobutamine: n/a mcg/kg/min (at max HR)  Stress Test Technologist: Irven Baltimore, RN  Nuclear Technologist:  Vedia Pereyra, CNMT     Rest Procedure:  Myocardial perfusion imaging was performed at rest 45 minutes following the intravenous administration of Technetium  68m Sestamibi. Rest ECG: NSR - Normal EKG  Stress Procedure: The patient attempted to walk the treadmill utilizing the Bruce Protocol for 4:30, but was unable to reach target heart rate due to DOE.  The patient received IV Lexiscan 0.4 mg over 15-seconds with concurrent low level exercise and then Technetium 27m Sestamibi was injected at 30-seconds while the patient continued walking one more minute. The patient denied chest pain during exercise or with Lexiscan. Quantitative spect images were obtained after a 45-minute delay. Stress ECG: PVCs but no ST changes.   QPS Raw Data Images:  Normal; no motion artifact; normal heart/lung ratio. Stress Images:  Small, mild inferior perfusion defect. Rest Images:  Small, mild inferior perfusion defect. Subtraction (SDS):  Fixed, small mild inferior perfusion defect. Transient Ischemic Dilatation (Normal <1.22):  1.03 Lung/Heart Ratio (Normal <0.45):  0.31  Quantitative Gated Spect Images QGS EDV:  88 ml QGS ESV:  48 ml  Impression Exercise Capacity:  Lexiscan with low level exercise. BP Response:  Normal blood pressure response. Clinical Symptoms:  Hip pain and oxygen saturation dropped to 88%.  ECG Impression:  PVCs but no ST changes. Comparison with Prior Nuclear Study: No significant change from previous study  Overall Impression:  Low risk stress nuclear study with a fixed, small mild inferior perfusion defect.  This may be due to diaphragmatic attenuation.  There is  no evidence for ischemia..  LV Ejection Fraction: 45%.  LV Wall Motion:  Mild global hypokinesis.  EF is consistent with last echo.   Loralie Champagne 10/17/2013  Not normal, but low risk.  No definite ischemia.  If he continues to have worsened exertional symptoms, would consider catheterization. Will need to see him back.  Loralie Champagne 10/18/2013 2:18 PM

## 2013-10-17 NOTE — Telephone Encounter (Signed)
Spoke w/pt and advised his case was resent for 72 hour "Fast Appeal" to [facility]. Pt understands and has cancelled echo until can be approved by insurance.

## 2013-10-17 NOTE — Telephone Encounter (Signed)
Received authorization for pt's 2d echo from [facility].  Pt put back on schedule w/original appt.  Pt aware.

## 2013-10-18 ENCOUNTER — Encounter: Payer: Self-pay | Admitting: Cardiology

## 2013-10-18 NOTE — Progress Notes (Signed)
Discussed with patient. Pt scheduled for echo 10/19/13 and appt with Dr Aundra Dubin 11/12/13.

## 2013-10-19 ENCOUNTER — Other Ambulatory Visit (HOSPITAL_COMMUNITY): Payer: Medicare Other

## 2013-10-19 ENCOUNTER — Ambulatory Visit (HOSPITAL_COMMUNITY): Payer: Medicare Other | Attending: Internal Medicine | Admitting: Cardiology

## 2013-10-19 DIAGNOSIS — E785 Hyperlipidemia, unspecified: Secondary | ICD-10-CM | POA: Diagnosis not present

## 2013-10-19 DIAGNOSIS — R0609 Other forms of dyspnea: Secondary | ICD-10-CM | POA: Insufficient documentation

## 2013-10-19 DIAGNOSIS — I2510 Atherosclerotic heart disease of native coronary artery without angina pectoris: Secondary | ICD-10-CM | POA: Diagnosis present

## 2013-10-19 DIAGNOSIS — I252 Old myocardial infarction: Secondary | ICD-10-CM | POA: Diagnosis not present

## 2013-10-19 DIAGNOSIS — J449 Chronic obstructive pulmonary disease, unspecified: Secondary | ICD-10-CM | POA: Diagnosis not present

## 2013-10-19 DIAGNOSIS — I059 Rheumatic mitral valve disease, unspecified: Secondary | ICD-10-CM | POA: Insufficient documentation

## 2013-10-19 DIAGNOSIS — R0989 Other specified symptoms and signs involving the circulatory and respiratory systems: Secondary | ICD-10-CM | POA: Diagnosis not present

## 2013-10-19 DIAGNOSIS — Z87891 Personal history of nicotine dependence: Secondary | ICD-10-CM | POA: Insufficient documentation

## 2013-10-19 DIAGNOSIS — R0600 Dyspnea, unspecified: Secondary | ICD-10-CM

## 2013-10-19 DIAGNOSIS — J4489 Other specified chronic obstructive pulmonary disease: Secondary | ICD-10-CM | POA: Insufficient documentation

## 2013-10-19 NOTE — Progress Notes (Signed)
Echo performed. 

## 2013-10-24 ENCOUNTER — Telehealth: Payer: Self-pay | Admitting: Cardiology

## 2013-10-24 NOTE — Telephone Encounter (Signed)
I spoke with pt & he states he thinks his "staggering & dizziness spells" were b/c of an inflamed right ear. He just finished a z pack.  He will call back prior to 11/12/13 appt with Dr. Aundra Dubin to cancel if this clears up.  Follow-up as directed in December Otherwise he will keep the appt for later this month.  Horton Chin RN

## 2013-10-24 NOTE — Telephone Encounter (Signed)
New message    Patient wife calling has question regarding 8/31 appt verse Dec reminder.

## 2013-11-12 ENCOUNTER — Ambulatory Visit: Payer: Medicare Other | Admitting: Cardiology

## 2013-11-22 ENCOUNTER — Other Ambulatory Visit: Payer: Self-pay | Admitting: Gastroenterology

## 2013-11-23 ENCOUNTER — Encounter (HOSPITAL_COMMUNITY): Payer: Self-pay | Admitting: *Deleted

## 2013-11-26 NOTE — Anesthesia Preprocedure Evaluation (Addendum)
Anesthesia Evaluation  Patient identified by MRN, date of birth, ID band Patient awake    Reviewed: Allergy & Precautions, H&P , NPO status , Patient's Chart, lab work & pertinent test results  Airway Mallampati: II TM Distance: >3 FB Neck ROM: Full    Dental no notable dental hx.    Pulmonary sleep apnea , COPDformer smoker,  breath sounds clear to auscultation  Pulmonary exam normal       Cardiovascular hypertension, + CAD, + Past MI and + Peripheral Vascular Disease Rhythm:Regular Rate:Normal     Neuro/Psych negative neurological ROS  negative psych ROS   GI/Hepatic GERD-  ,(+) Hepatitis -  Endo/Other  negative endocrine ROS  Renal/GU negative Renal ROS     Musculoskeletal negative musculoskeletal ROS (+)   Abdominal   Peds  Hematology negative hematology ROS (+)   Anesthesia Other Findings   Reproductive/Obstetrics negative OB ROS                          Anesthesia Physical Anesthesia Plan  ASA: III  Anesthesia Plan: MAC   Post-op Pain Management:    Induction:   Airway Management Planned:   Additional Equipment:   Intra-op Plan:   Post-operative Plan:   Informed Consent: I have reviewed the patients History and Physical, chart, labs and discussed the procedure including the risks, benefits and alternatives for the proposed anesthesia with the patient or authorized representative who has indicated his/her understanding and acceptance.   Dental advisory given  Plan Discussed with: CRNA  Anesthesia Plan Comments:         Anesthesia Quick Evaluation

## 2013-11-27 ENCOUNTER — Encounter (HOSPITAL_COMMUNITY)
Admission: RE | Disposition: A | Discharge status: Home / Self Care | Payer: Self-pay | Source: Ambulatory Visit | Attending: Gastroenterology

## 2013-11-27 ENCOUNTER — Encounter (HOSPITAL_COMMUNITY): Payer: Medicare Other | Admitting: Anesthesiology

## 2013-11-27 ENCOUNTER — Ambulatory Visit (HOSPITAL_COMMUNITY): Payer: Medicare Other | Admitting: Anesthesiology

## 2013-11-27 ENCOUNTER — Ambulatory Visit (HOSPITAL_COMMUNITY)
Admission: RE | Admit: 2013-11-27 | Discharge: 2013-11-27 | Disposition: A | Discharge status: Home / Self Care | Payer: Medicare Other | Source: Ambulatory Visit | Attending: Gastroenterology | Admitting: Gastroenterology

## 2013-11-27 ENCOUNTER — Encounter (HOSPITAL_COMMUNITY): Payer: Self-pay | Admitting: *Deleted

## 2013-11-27 DIAGNOSIS — IMO0002 Reserved for concepts with insufficient information to code with codable children: Secondary | ICD-10-CM | POA: Insufficient documentation

## 2013-11-27 DIAGNOSIS — Z7982 Long term (current) use of aspirin: Secondary | ICD-10-CM | POA: Diagnosis not present

## 2013-11-27 DIAGNOSIS — R109 Unspecified abdominal pain: Secondary | ICD-10-CM | POA: Diagnosis not present

## 2013-11-27 DIAGNOSIS — Q619 Cystic kidney disease, unspecified: Secondary | ICD-10-CM | POA: Diagnosis not present

## 2013-11-27 DIAGNOSIS — I714 Abdominal aortic aneurysm, without rupture, unspecified: Secondary | ICD-10-CM | POA: Diagnosis not present

## 2013-11-27 DIAGNOSIS — M5030 Other cervical disc degeneration, unspecified cervical region: Secondary | ICD-10-CM | POA: Insufficient documentation

## 2013-11-27 DIAGNOSIS — J309 Allergic rhinitis, unspecified: Secondary | ICD-10-CM | POA: Insufficient documentation

## 2013-11-27 DIAGNOSIS — N400 Enlarged prostate without lower urinary tract symptoms: Secondary | ICD-10-CM | POA: Insufficient documentation

## 2013-11-27 DIAGNOSIS — M899 Disorder of bone, unspecified: Secondary | ICD-10-CM | POA: Insufficient documentation

## 2013-11-27 DIAGNOSIS — D45 Polycythemia vera: Secondary | ICD-10-CM | POA: Diagnosis not present

## 2013-11-27 DIAGNOSIS — E7800 Pure hypercholesterolemia, unspecified: Secondary | ICD-10-CM | POA: Insufficient documentation

## 2013-11-27 DIAGNOSIS — K219 Gastro-esophageal reflux disease without esophagitis: Secondary | ICD-10-CM | POA: Insufficient documentation

## 2013-11-27 DIAGNOSIS — M1990 Unspecified osteoarthritis, unspecified site: Secondary | ICD-10-CM | POA: Insufficient documentation

## 2013-11-27 DIAGNOSIS — M949 Disorder of cartilage, unspecified: Secondary | ICD-10-CM | POA: Diagnosis not present

## 2013-11-27 DIAGNOSIS — K921 Melena: Secondary | ICD-10-CM | POA: Insufficient documentation

## 2013-11-27 DIAGNOSIS — I252 Old myocardial infarction: Secondary | ICD-10-CM | POA: Diagnosis not present

## 2013-11-27 DIAGNOSIS — G4733 Obstructive sleep apnea (adult) (pediatric): Secondary | ICD-10-CM | POA: Insufficient documentation

## 2013-11-27 DIAGNOSIS — J449 Chronic obstructive pulmonary disease, unspecified: Secondary | ICD-10-CM | POA: Insufficient documentation

## 2013-11-27 DIAGNOSIS — H409 Unspecified glaucoma: Secondary | ICD-10-CM | POA: Diagnosis not present

## 2013-11-27 DIAGNOSIS — J4489 Other specified chronic obstructive pulmonary disease: Secondary | ICD-10-CM | POA: Insufficient documentation

## 2013-11-27 HISTORY — DX: Adverse effect of unspecified anesthetic, initial encounter: T41.45XA

## 2013-11-27 HISTORY — DX: Essential (primary) hypertension: I10

## 2013-11-27 HISTORY — DX: Fracture of neck, unspecified, initial encounter: S12.9XXA

## 2013-11-27 HISTORY — DX: Inflammatory liver disease, unspecified: K75.9

## 2013-11-27 HISTORY — DX: Other complications of anesthesia, initial encounter: T88.59XA

## 2013-11-27 HISTORY — DX: Spontaneous ecchymoses: R23.3

## 2013-11-27 HISTORY — DX: Polyneuropathy, unspecified: G62.9

## 2013-11-27 HISTORY — DX: Other skin changes: R23.8

## 2013-11-27 HISTORY — PX: ESOPHAGOGASTRODUODENOSCOPY (EGD) WITH PROPOFOL: SHX5813

## 2013-11-27 SURGERY — ESOPHAGOGASTRODUODENOSCOPY (EGD) WITH PROPOFOL
Anesthesia: Monitor Anesthesia Care

## 2013-11-27 MED ORDER — ONDANSETRON HCL 4 MG/2ML IJ SOLN
INTRAMUSCULAR | Status: DC | PRN
  Administered 2013-11-27: 4 mg via INTRAVENOUS

## 2013-11-27 MED ORDER — KETAMINE HCL 10 MG/ML IJ SOLN
INTRAMUSCULAR | Status: AC
  Filled 2013-11-27: qty 1

## 2013-11-27 MED ORDER — LIDOCAINE HCL (CARDIAC) 20 MG/ML IV SOLN
INTRAVENOUS | Status: DC | PRN
  Administered 2013-11-27: 100 mg via INTRAVENOUS

## 2013-11-27 MED ORDER — PROPOFOL INFUSION 10 MG/ML OPTIME
INTRAVENOUS | Status: DC | PRN
  Administered 2013-11-27: 200 ug/kg/min via INTRAVENOUS

## 2013-11-27 MED ORDER — BUTAMBEN-TETRACAINE-BENZOCAINE 2-2-14 % EX AERO
INHALATION_SPRAY | CUTANEOUS | Status: DC | PRN
  Administered 2013-11-27: 1 via TOPICAL

## 2013-11-27 MED ORDER — LACTATED RINGERS IV SOLN
INTRAVENOUS | Status: DC
  Administered 2013-11-27: 1000 mL via INTRAVENOUS

## 2013-11-27 MED ORDER — PROPOFOL 10 MG/ML IV BOLUS
INTRAVENOUS | Status: AC
  Filled 2013-11-27: qty 20

## 2013-11-27 MED ORDER — LIDOCAINE HCL (CARDIAC) 20 MG/ML IV SOLN
INTRAVENOUS | Status: AC
  Filled 2013-11-27: qty 5

## 2013-11-27 MED ORDER — SODIUM CHLORIDE 0.9 % IV SOLN: INTRAVENOUS | Status: DC

## 2013-11-27 MED ORDER — KETAMINE HCL 10 MG/ML IJ SOLN
INTRAMUSCULAR | Status: DC | PRN
  Administered 2013-11-27: 20 mg via INTRAVENOUS

## 2013-11-27 MED ORDER — ONDANSETRON HCL 4 MG/2ML IJ SOLN
INTRAMUSCULAR | Status: AC
  Filled 2013-11-27: qty 2

## 2013-11-27 SURGICAL SUPPLY — 14 items

## 2013-11-27 NOTE — H&P (Signed)
  Problem: Upper abdominal pain associated with the passage of melenic-appearing stool on aspirin. Hemoglobin 15.2 g. Normal incomplete colonoscopy to 70 cm followed by air contrast barium enema performed on 06/25/2008 to  History: The patient is a 78 year old male born 03/09/1936. He takes aspirin 81 mg daily to prevent a recurrent myocardial infarction.  When he developed upper abdominal discomfort and passed a melenic-appearing stool, the patient was evaluated by his primary care physician. His hemoglobin was normal. He was placed on omeprazole 40 mg daily. He stopped taking aspirin 81 mg. He has not passed melenic-appearing stool and his abdominal discomfort is resolving. There is no past history of peptic ulcer disease. The patient is scheduled to undergo diagnostic esophagogastroduodenoscopy.  Past medical history: Osteoarthritis. Cervical degenerative disc disease. Chronic obstructive pulmonary disease. Benign prostatic hypertrophy. Allergic rhinitis. Osteopenia. Stable 3 cm abdominal aortic aneurysm. Microscopic hematuria with negative evaluation in 2006. Polycythemia. Lipoma. Gastroesophageal reflux. Left thumb amputation at work. Lumbar radiculopathy. Glaucoma. Hypercholesterolemia. 2.8 cm left kidney cysts. Myocardial infarction in December 2010. Coronary artery stent placement. Obstructive sleep apnea syndrome. Lumbar disc surgery. Cervical disc surgery. Sinus surgery. Appendectomy.  Medication allergies: Penicillin. Sulfa. Lyrica. Flovent. Relafen.  Exam: The patient is alert and lying comfortably on the endoscopy stretcher. Abdomen is soft and nontender to palpation. Lungs are clear to auscultation. Cardiac exam reveals a regular rhythm.  Plan: Proceed with diagnostic esophagogastroduodenoscopy.

## 2013-11-27 NOTE — Anesthesia Postprocedure Evaluation (Signed)
Anesthesia Post Note  Patient: [name]  Procedure(s) Performed: Procedure(s) (LRB): ESOPHAGOGASTRODUODENOSCOPY (EGD) WITH PROPOFOL (N/A)  Anesthesia type: MAC  Patient location: PACU  Post pain: Pain level controlled  Post assessment: Post-op Vital signs reviewed  Last Vitals: BP 114/68  Pulse 67  Temp(Src) 36.6 C (Oral)  Resp 16  Ht 6' (1.829 m)  Wt 212 lb 8 oz (96.389 kg)  BMI 28.81 kg/m2  SpO2 96%  Post vital signs: Reviewed  Level of consciousness: awake  Complications: No apparent anesthesia complications

## 2013-11-27 NOTE — Op Note (Signed)
Problem: Resolving upper abdominal pain associated with the passage of melenic-appearing stool on aspirin. Hemoglobin 15.2 g.  Endoscopist: Earle Gell  Premedication: Propofol administered by anesthesia  Procedure: Diagnostic esophagogastroduodenoscopy The patient was placed in the left lateral decubitus position. The Pentax gastroscope was passed through the posterior hypopharynx into the proximal esophagus without difficulty. The hypopharynx, larynx, and vocal cords appeared normal.  Esophagoscopy: The proximal, mid, and lower segments of the esophageal mucosa appeared normal. The squamocolumnar junction was slightly irregular in appearance and noted at 40 cm from the incisor teeth.  Gastroscopy: Retroflex view of the gastric cardia and fundus was normal. The gastric body, antrum, and pylorus appeared normal.  Duodenoscopy: The duodenal bulb and descending duodenum appeared normal. The third portion of the duodenum appeared normal.  Assessment: Normal esophagogastroduodenoscopy  Recommendation: Resume taking aspirin 81 mg daily. Continue taking omeprazole 20 mg before breakfast each morning to reduce your risk of bleeding on aspirin therapy

## 2013-11-27 NOTE — Discharge Instructions (Signed)
Monitored Anesthesia Care Monitored anesthesia care is an anesthesia service for a medical procedure. Anesthesia is the loss of the ability to feel pain. It is produced by medicines called anesthetics. It may affect a small area of your body (local anesthesia), a large area of your body (regional anesthesia), or your entire body (general anesthesia). The need for monitored anesthesia care depends your procedure, your condition, and the potential need for regional or general anesthesia. It is often provided during procedures where:   General anesthesia may be needed if there are complications. This is because you need special care when you are under general anesthesia.   You will be under local or regional anesthesia. This is so that you are able to have higher levels of anesthesia if needed.   You will receive calming medicines (sedatives). This is especially the case if sedatives are given to put you in a semi-conscious state of relaxation (deep sedation). This is because the amount of sedative needed to produce this state can be hard to predict. Too much of a sedative can produce general anesthesia. Monitored anesthesia care is performed by one or more health care providers who have special training in all types of anesthesia. You will need to meet with these health care providers before your procedure. During this meeting, they will ask you about your medical history. They will also give you instructions to follow. (For example, you will need to stop eating and drinking before your procedure. You may also need to stop or change medicines you are taking.) During your procedure, your health care providers will stay with you. They will:   Watch your condition. This includes watching your blood pressure, breathing, and level of pain.   Diagnose and treat problems that occur.   Give medicines if they are needed. These may include calming medicines (sedatives) and anesthetics.   Make sure you are  comfortable.  Having monitored anesthesia care does not necessarily mean that you will be under anesthesia. It does mean that your health care providers will be able to manage anesthesia if you need it or if it occurs. It also means that you will be able to have a different type of anesthesia than you are having if you need it. When your procedure is complete, your health care providers will continue to watch your condition. They will make sure any medicines wear off before you are allowed to go home.  Document Released: 11/25/2004 Document Revised: 07/16/2013 Document Reviewed: 04/12/2012 [facility] Patient Information 2015 [place], [place]. This information is not intended to replace advice given to you by your health care provider. Make sure you discuss any questions you have with your health care provider. Gastrointestinal Endoscopy, Care After Refer to this sheet in the next few weeks. These instructions provide you with information on caring for yourself after your procedure. Your caregiver may also give you more specific instructions. Your treatment has been planned according to current medical practices, but problems sometimes occur. Call your caregiver if you have any problems or questions after your procedure. HOME CARE INSTRUCTIONS  If you were given medicine to help you relax (sedative), do not drive, operate machinery, or sign important documents for 24 hours.  Avoid alcohol and hot or warm beverages for the first 24 hours after the procedure.  Only take over-the-counter or prescription medicines for pain, discomfort, or fever as directed by your caregiver. You may resume taking your normal medicines unless your caregiver tells you otherwise. Ask your caregiver when you may  resume taking medicines that may cause bleeding, such as aspirin, clopidogrel, or warfarin.  You may return to your normal diet and activities on the day after your procedure, or as directed by your caregiver. Walking may  help to reduce any bloated feeling in your abdomen.  Drink enough fluids to keep your urine clear or pale yellow.  You may gargle with salt water if you have a sore throat. SEEK IMMEDIATE MEDICAL CARE IF:  You have severe nausea or vomiting.  You have severe abdominal pain, abdominal cramps that last longer than 6 hours, or abdominal swelling (distention).  You have severe shoulder or back pain.  You have trouble swallowing.  You have shortness of breath, your breathing is shallow, or you are breathing faster than normal.  You have a fever or a rapid heartbeat.  You vomit blood or material that looks like coffee grounds.  You have bloody, black, or tarry stools. MAKE SURE YOU:  Understand these instructions.  Will watch your condition.  Will get help right away if you are not doing well or get worse. Document Released: 10/14/2003 Document Revised: 07/16/2013 Document Reviewed: 06/01/2011 [facility] Patient Information 2015 [place], [place]. This information is not intended to replace advice given to you by your health care provider. Make sure you discuss any questions you have with your health care provider.

## 2013-11-27 NOTE — Transfer of Care (Signed)
Immediate Anesthesia Transfer of Care Note  Patient: [name]  Procedure(s) Performed: Procedure(s) (LRB): ESOPHAGOGASTRODUODENOSCOPY (EGD) WITH PROPOFOL (N/A)  Patient Location: PACU  Anesthesia Type: MAC  Level of Consciousness: sedated, patient cooperative and responds to stimulation  Airway & Oxygen Therapy: Patient Spontanous Breathing and Patient connected to face mask oxgen  Post-op Assessment: Report given to PACU RN and Post -op Vital signs reviewed and stable  Post vital signs: Reviewed and stable  Complications: No apparent anesthesia complications

## 2013-11-28 ENCOUNTER — Encounter (HOSPITAL_COMMUNITY): Payer: Self-pay | Admitting: Gastroenterology

## 2014-02-21 ENCOUNTER — Encounter (HOSPITAL_COMMUNITY): Payer: Self-pay | Admitting: Cardiology

## 2014-03-05 ENCOUNTER — Ambulatory Visit (HOSPITAL_COMMUNITY): Payer: Medicare Other | Attending: Internal Medicine | Admitting: Cardiology

## 2014-03-05 DIAGNOSIS — I714 Abdominal aortic aneurysm, without rupture: Secondary | ICD-10-CM | POA: Insufficient documentation

## 2014-03-05 DIAGNOSIS — E785 Hyperlipidemia, unspecified: Secondary | ICD-10-CM | POA: Insufficient documentation

## 2014-03-05 DIAGNOSIS — Z87891 Personal history of nicotine dependence: Secondary | ICD-10-CM | POA: Insufficient documentation

## 2014-03-05 DIAGNOSIS — R42 Dizziness and giddiness: Secondary | ICD-10-CM

## 2014-03-05 DIAGNOSIS — I2510 Atherosclerotic heart disease of native coronary artery without angina pectoris: Secondary | ICD-10-CM | POA: Diagnosis not present

## 2014-03-05 DIAGNOSIS — J449 Chronic obstructive pulmonary disease, unspecified: Secondary | ICD-10-CM | POA: Diagnosis not present

## 2014-03-05 DIAGNOSIS — I6522 Occlusion and stenosis of left carotid artery: Secondary | ICD-10-CM

## 2014-03-05 DIAGNOSIS — I6523 Occlusion and stenosis of bilateral carotid arteries: Secondary | ICD-10-CM

## 2014-03-05 NOTE — Progress Notes (Signed)
Carotid duplex performed 

## 2014-03-13 ENCOUNTER — Other Ambulatory Visit: Payer: Self-pay

## 2014-03-13 DIAGNOSIS — E041 Nontoxic single thyroid nodule: Secondary | ICD-10-CM

## 2014-03-20 ENCOUNTER — Encounter: Payer: Self-pay | Admitting: Cardiology

## 2014-03-20 ENCOUNTER — Ambulatory Visit (INDEPENDENT_AMBULATORY_CARE_PROVIDER_SITE_OTHER): Payer: Medicare Other | Admitting: Cardiology

## 2014-03-20 ENCOUNTER — Encounter: Payer: Self-pay | Admitting: *Deleted

## 2014-03-20 VITALS — BP 118/70 | HR 90 | Ht 72.0 in | Wt 218.8 lb

## 2014-03-20 DIAGNOSIS — I2510 Atherosclerotic heart disease of native coronary artery without angina pectoris: Secondary | ICD-10-CM

## 2014-03-20 DIAGNOSIS — I6521 Occlusion and stenosis of right carotid artery: Secondary | ICD-10-CM

## 2014-03-20 DIAGNOSIS — E785 Hyperlipidemia, unspecified: Secondary | ICD-10-CM

## 2014-03-20 DIAGNOSIS — I255 Ischemic cardiomyopathy: Secondary | ICD-10-CM

## 2014-03-20 LAB — LIPID PANEL
Cholesterol: 104 mg/dL (ref 0–200)
HDL: 41.7 mg/dL (ref >39.00)
LDL Cholesterol: 42 mg/dL (ref 0–99)
NonHDL: 62.3
Total CHOL/HDL Ratio: 2
Triglycerides: 100 mg/dL (ref 0.0–149.0)
VLDL: 20 mg/dL (ref 0.0–40.0)

## 2014-03-20 LAB — BASIC METABOLIC PANEL
BUN: 15 mg/dL (ref 6–23)
CO2: 32 mEq/L (ref 19–32)
Calcium: 9.1 mg/dL (ref 8.4–10.5)
Chloride: 103 mEq/L (ref 96–112)
Creatinine, Ser: 1 mg/dL (ref 0.4–1.5)
GFR: 81.91 mL/min (ref >60.00)
Glucose, Bld: 99 mg/dL (ref 70–99)
Potassium: 4.7 mEq/L (ref 3.5–5.1)
Sodium: 139 mEq/L (ref 135–145)

## 2014-03-20 NOTE — Patient Instructions (Addendum)
Your physician recommends that you have lab today--Lipid/BMET.  Your physician has requested that you have a carotid duplex. This test is an ultrasound of the carotid arteries in your neck. It looks at blood flow through these arteries that supply the brain with blood. Allow one hour for this exam. There are no restrictions or special instructions. December 2016  Your physician wants you to follow-up in: 1 year with Dr Aundra Dubin. (January  2017).  You will receive a reminder letter in the mail two months in advance. If you don't receive a letter, please call our office to schedule the follow-up appointment.

## 2014-03-20 NOTE — Progress Notes (Signed)
Patient ID: [name], male   DOB: Mar 20, 1935, 79 y.o.   MRN: [id number] PCP: Dr. Lysle Rubens  79 yo with history of CAD s/p NSTEMI/PCI in 1/11 and unstable angina/PCI in 5/13 as well as L-spine stenosis with multiple prior back surgeries and COPD presents for cardiology followup.  He had 2 DES placed in the RCA in 1/11 at the time of his NSTEMI.  He was on Effient until 2/12.  He had a myoview in 8/11 that showed no ischemia or infarction.  He was admitted with unstable angina in 5/13 and had DES to proximal LCX and cutting balloon angioplasty to proximal RCA in-stent restenosis.  Last echo in 12/14 showed EF 45-50%.  In 8/15, he developed atypical chest pain.  Lexiscan Cardiolite showed no ischemia, and echo showed stable EF 45-50%.    Recently, he has been stable symptomatically.   He has not had any chest pain. He can walk on flat ground without dyspnea at a slow pace but is short of breath walking up a hill or if he walks fast.  This is chronic.  Patient continues to have chronic low back pain and hip pain that is worse with standing and walking.  He has had numerous operations on his C-spine and L-spine.  He has L-spine stenosis.  He has had frequent PVCs chronically but does not feel them.  He has been unable to tolerate any inhalers other than albuterol for his COPD due to throat swelling.    Labs (3/12): K 5, creatinine 1.05, LDL 42, HDL 41 Labs (6/13): K 3.9, creatinine 1.04, LDL 44, HDL 67 Labs (4/14): K 5.2, creatinine 1.06, LDL 46, HDL 42 Labs (4/15): K 4.6, creatinine 1.02, LDL 30, HDL 39  PMH: 1. CAD: NSTEMI in 12/10.  Patient had DES x 2 to RCA on 03/17/09. Myoview in 8/11 showed EF 58%, no ischemia or infarction. Unstable angina in 5/13.  Patient had DES to proximal LCX and cutting balloon angioplasty to proximal RCA in-stent restenosis with 80-90% stenosis in a small OM1 also noted.  EF at cath was 55%.  Lexiscan Cardiolite (8/15) with EF 45%, small fixed inferior defect consistent with  prior MI.  2. Hyperlipidemia 3. CLL 4. Erectile dysfunction 5. BPH 6. COPD: uses oxygen at night.  7. C-spine surgery after trauma in 2005 and 2008.  8. AAA: PCP follows with serial ultrasounds.  Aortic US (4/15) with 3.2 cm AAA. 9. Appendectomy 10. GERD 11. L-spine disc disease and spinal stenosis with multiple surgeries.  Has chronic low back and leg pain.  12. Ischemic cardiomyopathy: echo (9/12) with EF 45-50%. LV-gram with EF 55% in 5/13.  Echo (12/14) with EF 45-50%.  Echo (8/15) with EF 45-50%, moderate LVH, inferior/inferoseptal hypokinesis.  13. Carotid stenosis: 6/13 carotids with 60-79% RICA stenosis.  12/13 40-59% RICA stenosis.  81/01 75-10% LICA stenosis. 12/15 40-59% RICA stenosis.  14. Hyperkalemia with ACEI 15. OSA: Intolerant of CPAP, uses mouthpiece.  16. PVCs  SH: Stopped smoking in 2008.  Married, lives in [place].   [place]: Father died of lung cancer.  Mother with dementia.   ROS: All systems reviewed and negative except as per HPI.    Current Outpatient Prescriptions  Medication Sig Dispense Refill  . albuterol (PROAIR HFA) 108 (90 BASE) MCG/ACT inhaler Inhale 2 puffs into the lungs every 6 (six) hours as needed. For shortness of breath    . aspirin 81 MG tablet Take 81 mg by mouth daily.     Marland Kitchen  gabapentin (NEURONTIN) 300 MG capsule Take 600 mg by mouth 2 (two) times daily.     Marland Kitchen latanoprost (XALATAN) 0.005 % ophthalmic solution Place 1 drop into both eyes at bedtime.     . metoprolol succinate (TOPROL-XL) 25 MG 24 hr tablet Take 25 mg by mouth daily.    . nitroGLYCERIN (NITROSTAT) 0.4 MG SL tablet Place 0.4 mg under the tongue every 5 (five) minutes as needed. For chest pain    . rosuvastatin (CRESTOR) 10 MG tablet Take 10 mg by mouth every evening.     . Tamsulosin HCl (FLOMAX) 0.4 MG CAPS Take 0.4 mg by mouth every evening.     . timolol (BETIMOL) 0.5 % ophthalmic solution Place 1 drop into both eyes 2 (two) times daily.    . metoprolol succinate (TOPROL-XL) 25  MG 24 hr tablet Take 25 mg by mouth every morning.      No current facility-administered medications for this visit.    BP 118/70 mmHg  Pulse 90  Ht 6' (1.829 m)  Wt 218 lb 12.8 oz (99.247 kg)  BMI 29.67 kg/m2  SpO2 95% General: NAD Neck: No JVD, no thyromegaly or thyroid nodule.  Lungs: Slightly decreased breath sounds bilaterally.  CV: Nondisplaced PMI.  Heart regular S1/S2, no S3/S4, no murmur.  No peripheral edema.  Soft right carotid bruit.  1+ PT pulses bilaterally. Bilateral lower leg venous varicosities.  Abdomen: Soft, nontender, no hepatosplenomegaly, no distention.  Neurologic: Alert and oriented x 3.  Psych: Normal affect. Extremities: No clubbing or cyanosis.   Assessment/Plan:  CAD  Stable status post PCI in 5/13 with no exertional chest pain and stable exertional dyspnea.  He will continue ASA 81 mg daily, Toprol XL 25 mg daily, Crestor.  He has not been able to tolerate ACEI in the past due to hyperkalemia, and he has had fatigue with > 25 mg daily Toprol XL.  EF on recent echo was stable at 45-50%.  COPD I suspect that Mr Dungan's exertional dyspnea is due more to COPD than to his heart.  He has not qualified for oxygen during the day but is using nocturnal oxygen.  Carotid stenosis  Stable mild to moderate RICA stenosis.  Followup carotids in 12/16.  Hyperlipidemia  Check lipids today.   Loralie Champagne 03/20/2014

## 2014-03-22 ENCOUNTER — Ambulatory Visit
Admission: RE | Admit: 2014-03-22 | Discharge: 2014-03-22 | Disposition: A | Discharge status: Home / Self Care | Payer: Medicare Other | Source: Ambulatory Visit | Attending: Cardiology | Admitting: Cardiology

## 2014-03-22 DIAGNOSIS — E041 Nontoxic single thyroid nodule: Secondary | ICD-10-CM

## 2014-03-25 ENCOUNTER — Telehealth: Payer: Self-pay | Admitting: Cardiology

## 2014-03-25 NOTE — Telephone Encounter (Signed)
New message ° ° ° ° °Returning a nurses call to get lab results °

## 2014-03-25 NOTE — Telephone Encounter (Signed)
Notified wife of lab results and [place] of neck.  She will call Dr. Glenna Durand office to be seen to schedule the biopsy. I had spoken with Dr. Arbie Cookey nurse earlier today and was told to have pt call to make an appointment and they would schedule biopsy of thyroid.

## 2014-03-25 NOTE — Telephone Encounter (Signed)
lmtcb

## 2014-03-27 ENCOUNTER — Other Ambulatory Visit: Payer: Self-pay | Admitting: Internal Medicine

## 2014-03-27 DIAGNOSIS — E041 Nontoxic single thyroid nodule: Secondary | ICD-10-CM

## 2014-04-02 ENCOUNTER — Other Ambulatory Visit (HOSPITAL_COMMUNITY)
Admission: RE | Admit: 2014-04-02 | Discharge: 2014-04-02 | Disposition: A | Discharge status: Home / Self Care | Payer: Medicare Other | Source: Ambulatory Visit | Attending: Interventional Radiology | Admitting: Interventional Radiology

## 2014-04-02 ENCOUNTER — Ambulatory Visit
Admission: RE | Admit: 2014-04-02 | Discharge: 2014-04-02 | Disposition: A | Discharge status: Home / Self Care | Payer: Medicare Other | Source: Ambulatory Visit | Attending: Internal Medicine | Admitting: Internal Medicine

## 2014-04-02 DIAGNOSIS — E041 Nontoxic single thyroid nodule: Secondary | ICD-10-CM | POA: Insufficient documentation

## 2014-05-29 ENCOUNTER — Other Ambulatory Visit: Payer: Self-pay | Admitting: Gastroenterology

## 2014-07-02 ENCOUNTER — Other Ambulatory Visit: Payer: Self-pay | Admitting: Internal Medicine

## 2014-07-02 DIAGNOSIS — I714 Abdominal aortic aneurysm, without rupture, unspecified: Secondary | ICD-10-CM

## 2014-07-22 ENCOUNTER — Ambulatory Visit
Admission: RE | Admit: 2014-07-22 | Discharge: 2014-07-22 | Disposition: A | Discharge status: Home / Self Care | Payer: Medicare Other | Source: Ambulatory Visit | Attending: Internal Medicine | Admitting: Internal Medicine

## 2014-07-22 ENCOUNTER — Other Ambulatory Visit: Payer: Self-pay | Admitting: Internal Medicine

## 2014-07-22 DIAGNOSIS — I714 Abdominal aortic aneurysm, without rupture, unspecified: Secondary | ICD-10-CM

## 2014-08-01 ENCOUNTER — Telehealth: Payer: Self-pay | Admitting: Cardiology

## 2014-08-01 DIAGNOSIS — G4734 Idiopathic sleep related nonobstructive alveolar hypoventilation: Secondary | ICD-10-CM

## 2014-08-01 NOTE — Telephone Encounter (Signed)
New message      Talk to someone regarding changing oxygen from [facility] to [facility].  [facility] has already picked up their equipment.  Pt is without oxygen now but does not use it daily.  Please call

## 2014-08-01 NOTE — Telephone Encounter (Signed)
Dr Aundra Dubin had prescribed 2 LPM oxygen at night 09/30/13 based on overnight oximetry results from 09/27/13 ( results in [facility] tab).

## 2014-08-01 NOTE — Telephone Encounter (Signed)
Pt requesting to get oxygen equipment from [facility] instead of [facility], oxygen equipment was removed from pt's home at pt's request yesterday.

## 2014-08-01 NOTE — Telephone Encounter (Signed)
Pt advised I will contact [facility] with request to provide oxygen equipment to patient.

## 2014-08-01 NOTE — Telephone Encounter (Signed)
In basket message to [place] with [facility].

## 2014-11-11 ENCOUNTER — Encounter (HOSPITAL_COMMUNITY): Payer: Self-pay | Admitting: *Deleted

## 2014-11-26 ENCOUNTER — Encounter (HOSPITAL_COMMUNITY): Payer: Self-pay

## 2014-11-26 ENCOUNTER — Ambulatory Visit (HOSPITAL_COMMUNITY): Payer: Medicare Other | Admitting: Anesthesiology

## 2014-11-26 ENCOUNTER — Encounter (HOSPITAL_COMMUNITY)
Admission: RE | Disposition: A | Discharge status: Home / Self Care | Payer: Self-pay | Source: Ambulatory Visit | Attending: Gastroenterology

## 2014-11-26 ENCOUNTER — Ambulatory Visit (HOSPITAL_COMMUNITY)
Admission: RE | Admit: 2014-11-26 | Discharge: 2014-11-26 | Disposition: A | Discharge status: Home / Self Care | Payer: Medicare Other | Source: Ambulatory Visit | Attending: Gastroenterology | Admitting: Gastroenterology

## 2014-11-26 DIAGNOSIS — K759 Inflammatory liver disease, unspecified: Secondary | ICD-10-CM | POA: Insufficient documentation

## 2014-11-26 DIAGNOSIS — I739 Peripheral vascular disease, unspecified: Secondary | ICD-10-CM | POA: Insufficient documentation

## 2014-11-26 DIAGNOSIS — I252 Old myocardial infarction: Secondary | ICD-10-CM | POA: Insufficient documentation

## 2014-11-26 DIAGNOSIS — Z89012 Acquired absence of left thumb: Secondary | ICD-10-CM | POA: Diagnosis not present

## 2014-11-26 DIAGNOSIS — Z79899 Other long term (current) drug therapy: Secondary | ICD-10-CM | POA: Diagnosis not present

## 2014-11-26 DIAGNOSIS — N400 Enlarged prostate without lower urinary tract symptoms: Secondary | ICD-10-CM | POA: Diagnosis not present

## 2014-11-26 DIAGNOSIS — I10 Essential (primary) hypertension: Secondary | ICD-10-CM | POA: Insufficient documentation

## 2014-11-26 DIAGNOSIS — J449 Chronic obstructive pulmonary disease, unspecified: Secondary | ICD-10-CM | POA: Diagnosis not present

## 2014-11-26 DIAGNOSIS — Z1211 Encounter for screening for malignant neoplasm of colon: Secondary | ICD-10-CM | POA: Insufficient documentation

## 2014-11-26 DIAGNOSIS — M5030 Other cervical disc degeneration, unspecified cervical region: Secondary | ICD-10-CM | POA: Insufficient documentation

## 2014-11-26 DIAGNOSIS — G4733 Obstructive sleep apnea (adult) (pediatric): Secondary | ICD-10-CM | POA: Insufficient documentation

## 2014-11-26 DIAGNOSIS — Z87891 Personal history of nicotine dependence: Secondary | ICD-10-CM | POA: Insufficient documentation

## 2014-11-26 DIAGNOSIS — Z9981 Dependence on supplemental oxygen: Secondary | ICD-10-CM | POA: Diagnosis not present

## 2014-11-26 DIAGNOSIS — E780 Pure hypercholesterolemia: Secondary | ICD-10-CM | POA: Diagnosis not present

## 2014-11-26 DIAGNOSIS — I714 Abdominal aortic aneurysm, without rupture: Secondary | ICD-10-CM | POA: Insufficient documentation

## 2014-11-26 DIAGNOSIS — K219 Gastro-esophageal reflux disease without esophagitis: Secondary | ICD-10-CM | POA: Insufficient documentation

## 2014-11-26 DIAGNOSIS — I2510 Atherosclerotic heart disease of native coronary artery without angina pectoris: Secondary | ICD-10-CM | POA: Insufficient documentation

## 2014-11-26 DIAGNOSIS — H409 Unspecified glaucoma: Secondary | ICD-10-CM | POA: Diagnosis not present

## 2014-11-26 DIAGNOSIS — M1990 Unspecified osteoarthritis, unspecified site: Secondary | ICD-10-CM | POA: Diagnosis not present

## 2014-11-26 DIAGNOSIS — Z955 Presence of coronary angioplasty implant and graft: Secondary | ICD-10-CM | POA: Insufficient documentation

## 2014-11-26 HISTORY — DX: Reserved for inherently not codable concepts without codable children: IMO0001

## 2014-11-26 HISTORY — PX: COLONOSCOPY WITH PROPOFOL: SHX5780

## 2014-11-26 HISTORY — DX: Dependence on supplemental oxygen: Z99.81

## 2014-11-26 HISTORY — DX: Unspecified hearing loss, unspecified ear: H91.90

## 2014-11-26 SURGERY — COLONOSCOPY WITH PROPOFOL
Anesthesia: Monitor Anesthesia Care

## 2014-11-26 MED ORDER — SODIUM CHLORIDE 0.9 % IV SOLN: INTRAVENOUS | Status: DC

## 2014-11-26 MED ORDER — LIDOCAINE HCL (CARDIAC) 20 MG/ML IV SOLN
INTRAVENOUS | Status: AC
  Filled 2014-11-26: qty 5

## 2014-11-26 MED ORDER — LACTATED RINGERS IV SOLN
INTRAVENOUS | Status: DC | PRN
  Administered 2014-11-26: 09:00:00 via INTRAVENOUS

## 2014-11-26 MED ORDER — PROPOFOL INFUSION 10 MG/ML OPTIME
INTRAVENOUS | Status: DC | PRN
  Administered 2014-11-26: 120 ug/kg/min via INTRAVENOUS

## 2014-11-26 MED ORDER — PROPOFOL 10 MG/ML IV BOLUS
INTRAVENOUS | Status: AC
Start: 2014-11-26 — End: 2014-11-26
  Filled 2014-11-26: qty 20

## 2014-11-26 MED ORDER — PROPOFOL 10 MG/ML IV BOLUS
INTRAVENOUS | Status: AC
  Filled 2014-11-26: qty 20

## 2014-11-26 MED ORDER — PROPOFOL 10 MG/ML IV BOLUS
INTRAVENOUS | Status: DC | PRN
  Administered 2014-11-26 (×2): 30 mg via INTRAVENOUS

## 2014-11-26 MED ORDER — FENTANYL CITRATE (PF) 100 MCG/2ML IJ SOLN: 25.0000 ug | INTRAMUSCULAR | Status: DC | PRN

## 2014-11-26 SURGICAL SUPPLY — 21 items

## 2014-11-26 NOTE — Op Note (Signed)
Procedure: Screening colonoscopy. 04/16/2014 ciprofloxacin and metronidazole prescribed to treat suspected acute diverticulitis (abdominal pain and hematochezia). 05/09/2014 metronidazole prescribed to treat C. difficile colitis. 06/25/2008 normal flexible proctosigmoidoscopy and air-contrast barium enema.  Endoscopist: Earle Gell  Premedication: Propofol administered by anesthesia  Procedure: The patient was placed in the left lateral decubitus position. Anal inspection and digital rectal exam were normal. The Pentax pediatric colonoscope was introduced into the rectum and advanced to the cecum. A normal-appearing appendiceal orifice and ileocecal valve were identified. Colonic preparation for the exam today was good. Withdrawal time was 10 minutes  Rectum. Normal. Retroflexed view of the distal rectum was normal  Sigmoid colon and descending colon. Normal  Splenic flexure. Normal  Transverse colon. Normal  Hepatic flexure. Normal  Descending colon. Normal  Cecum and ileocecal valve. Normal  Assessment: Normal screening colonoscopy

## 2014-11-26 NOTE — Transfer of Care (Signed)
Immediate Anesthesia Transfer of Care Note  Patient: [name]  Procedure(s) Performed: Procedure(s): COLONOSCOPY WITH PROPOFOL (N/A)  Patient Location: PACU and Endoscopy Unit  Anesthesia Type:MAC  Level of Consciousness: awake, alert , oriented and patient cooperative  Airway & Oxygen Therapy: Patient Spontanous Breathing and Patient connected to face mask oxygen  Post-op Assessment: Report given to RN, Post -op Vital signs reviewed and stable and Patient moving all extremities  Post vital signs: Reviewed and stable  Last Vitals:  Filed Vitals:   11/26/14 0823  BP: 126/72  Pulse: 75  Temp: 36.6 C  Resp: 17    Complications: No apparent anesthesia complications

## 2014-11-26 NOTE — Anesthesia Preprocedure Evaluation (Addendum)
Anesthesia Evaluation  Patient identified by MRN, date of birth, ID band Patient awake    Reviewed: Allergy & Precautions, H&P , NPO status , Patient's Chart, lab work & pertinent test results  Airway Mallampati: II  TM Distance: >3 FB Neck ROM: Full    Dental no notable dental hx. (+) Dental Advisory Given, Teeth Intact   Pulmonary sleep apnea , COPD,  COPD inhaler and oxygen dependent, former smoker,    Pulmonary exam normal breath sounds clear to auscultation       Cardiovascular hypertension, Pt. on home beta blockers + CAD, + Past MI, + Cardiac Stents and + Peripheral Vascular Disease  Normal cardiovascular exam Rhythm:Regular Rate:Normal  AAA 2.7 cm   Neuro/Psych glaucoma negative neurological ROS  negative psych ROS   GI/Hepatic GERD  ,(+) Hepatitis -, UnspecifiedHepatitis as a child   Endo/Other  negative endocrine ROS  Renal/GU negative Renal ROS     Musculoskeletal negative musculoskeletal ROS (+)   Abdominal   Peds  Hematology  (+) Blood dyscrasia, , CLL   Anesthesia Other Findings   Reproductive/Obstetrics negative OB ROS                          Anesthesia Physical Anesthesia Plan  ASA: III  Anesthesia Plan: MAC   Post-op Pain Management:    Induction:   Airway Management Planned: Simple Face Mask  Additional Equipment:   Intra-op Plan:   Post-operative Plan:   Informed Consent:   Plan Discussed with: Surgeon  Anesthesia Plan Comments:      Anesthesia Quick Evaluation

## 2014-11-26 NOTE — Anesthesia Postprocedure Evaluation (Signed)
  Anesthesia Post-op Note  Patient: [name]  Procedure(s) Performed: Procedure(s) (LRB): COLONOSCOPY WITH PROPOFOL (N/A)  Patient Location: PACU  Anesthesia Type: MAC  Level of Consciousness: awake and alert   Airway and Oxygen Therapy: Patient Spontanous Breathing  Post-op Pain: mild  Post-op Assessment: Post-op Vital signs reviewed, Patient's Cardiovascular Status Stable, Respiratory Function Stable, Patent Airway and No signs of Nausea or vomiting  Last Vitals:  Filed Vitals:   11/26/14 1010  BP: 132/85  Pulse: 72  Temp:   Resp: 18    Post-op Vital Signs: stable   Complications: No apparent anesthesia complications

## 2014-11-26 NOTE — H&P (Signed)
  Procedure: Screening colonoscopy. 04/16/2014 ciprofloxacin and metronidazole prescribed to treat suspected acute diverticulitis (abdominal pain and hematochezia). 05/09/2014 metronidazole prescribed to treat C. difficile colitis. 06/25/2008 normal flexible proctosigmoidoscopy and air-contrast barium enema  History: The patient is a 79 year old male born 01-May-1935. He has chronic, left-sided abdominal pain. In April 2010, he underwent a normal flexible proctosigmoidoscopy followed by air contrast barium enema.  On 04/16/2014, the patient was prescribed ciprofloxacin and metronidazole to treat suspected acute diverticulitis. After therapy, he developed diarrhea and his stool was positive for C. difficile toxin. He completed a course of metronidazole 500 mg 3 times daily with resolution of his diarrhea.  The patient is scheduled to undergo a screening colonoscopy.  Past medical history: Osteoarthritis. Cervical degenerative disc disease requiring discectomy. Chronic obstructive pulmonary disease secondary to cigarette smoking. Benign prostatic hypertrophy. Allergic rhinitis. Small abdominal aortic aneurysm. Left thumb amputation. Gastroesophageal reflux. Lumbar radiculopathy. Glaucoma. Hypercholesterolemia. 2.8 cm left kidney cyst. Coronary artery disease. Coronary artery stent placement. Obstructive sleep apnea syndrome. Lumbar discectomy. Sinus surgery. Appendectomy.  Medication allergies: Penicillin. Bactrim. Lyrica. Brimonidine. Relafen. Lumigan. Qvar.   Exam: The patient is alert and lying comfortably on the endoscopy stretcher. Abdomen is soft and nontender to palpation. Cardiac exam reveals a regular rhythm. Lungs are clear to auscultation.  Plan: Proceed with screening colonoscopy

## 2014-11-26 NOTE — Progress Notes (Signed)
Pre procedure noted bilt conjunctival redness, right greater than left. Pt. States noted earlier, and eyes were itchy.

## 2014-11-27 ENCOUNTER — Encounter (HOSPITAL_COMMUNITY): Payer: Self-pay | Admitting: Gastroenterology

## 2015-03-04 ENCOUNTER — Ambulatory Visit (HOSPITAL_COMMUNITY)
Admission: RE | Admit: 2015-03-04 | Discharge: 2015-03-04 | Disposition: A | Discharge status: Home / Self Care | Payer: Medicare Other | Source: Ambulatory Visit | Attending: Cardiology | Admitting: Cardiology

## 2015-03-04 ENCOUNTER — Encounter (HOSPITAL_COMMUNITY): Payer: Medicare Other

## 2015-03-04 DIAGNOSIS — I255 Ischemic cardiomyopathy: Secondary | ICD-10-CM | POA: Diagnosis not present

## 2015-03-04 DIAGNOSIS — I10 Essential (primary) hypertension: Secondary | ICD-10-CM | POA: Insufficient documentation

## 2015-03-04 DIAGNOSIS — I6521 Occlusion and stenosis of right carotid artery: Secondary | ICD-10-CM

## 2015-03-04 DIAGNOSIS — E785 Hyperlipidemia, unspecified: Secondary | ICD-10-CM | POA: Diagnosis not present

## 2015-03-04 DIAGNOSIS — I6523 Occlusion and stenosis of bilateral carotid arteries: Secondary | ICD-10-CM | POA: Insufficient documentation

## 2015-05-16 ENCOUNTER — Encounter: Payer: Self-pay | Admitting: Cardiology

## 2015-05-16 ENCOUNTER — Ambulatory Visit (INDEPENDENT_AMBULATORY_CARE_PROVIDER_SITE_OTHER): Payer: Medicare Other | Admitting: Cardiology

## 2015-05-16 VITALS — BP 110/62 | HR 87 | Ht 71.0 in | Wt 214.0 lb

## 2015-05-16 DIAGNOSIS — I779 Disorder of arteries and arterioles, unspecified: Secondary | ICD-10-CM | POA: Diagnosis not present

## 2015-05-16 DIAGNOSIS — I739 Peripheral vascular disease, unspecified: Secondary | ICD-10-CM

## 2015-05-16 DIAGNOSIS — R0602 Shortness of breath: Secondary | ICD-10-CM | POA: Diagnosis not present

## 2015-05-16 DIAGNOSIS — G4733 Obstructive sleep apnea (adult) (pediatric): Secondary | ICD-10-CM | POA: Diagnosis not present

## 2015-05-16 DIAGNOSIS — I2510 Atherosclerotic heart disease of native coronary artery without angina pectoris: Secondary | ICD-10-CM

## 2015-05-16 DIAGNOSIS — I255 Ischemic cardiomyopathy: Secondary | ICD-10-CM

## 2015-05-16 DIAGNOSIS — I6521 Occlusion and stenosis of right carotid artery: Secondary | ICD-10-CM

## 2015-05-16 DIAGNOSIS — J410 Simple chronic bronchitis: Secondary | ICD-10-CM

## 2015-05-16 NOTE — Patient Instructions (Addendum)
Medication Instructions:  Your physician recommends that you continue on your current medications as directed. Please refer to the Current Medication list given to you today.   Labwork: None--have primary care send [place] your lab work.    Testing/Procedures: Your physician has requested that you have an echocardiogram. Echocardiography is a painless test that uses sound waves to create images of your heart. It provides your doctor with information about the size and shape of your heart and how well your heart's chambers and valves are working. This procedure takes approximately one hour. There are no restrictions for this procedure.   Your physician has requested that you have a carotid duplex. This test is an ultrasound of the carotid arteries in your neck. It looks at blood flow through these arteries that supply the brain with blood. Allow one hour for this exam. There are no restrictions or special instructions. To be done in December 2017   We will contact [place] to set up overnight oximetry  Follow-Up: Your physician wants you to follow-up in: 12 months.  You will receive a reminder letter in the mail two months in advance. If you don't receive a letter, please call our office to schedule the follow-up appointment.   Any Other Special Instructions Will Be Listed Below (If Applicable).     If you need a refill on your cardiac medications before your next appointment, please call your pharmacy.

## 2015-05-18 NOTE — Progress Notes (Signed)
Patient ID: [name], male   DOB: 1935-04-11, 80 y.o.   MRN: [id number] PCP: Dr. Lysle [name]  80 yo with history of CAD s/p NSTEMI/PCI in 1/11 and unstable angina/PCI in 5/13 as well as L-spine stenosis with multiple prior back surgeries and COPD presents for cardiology followup.  He had 2 DES placed in the RCA in 1/11 at the time of his NSTEMI.  He was on Effient until 2/12.  He had a myoview in 8/11 that showed no ischemia or infarction.  He was admitted with unstable angina in 5/13 and had DES to proximal LCX and cutting balloon angioplasty to proximal RCA in-stent restenosis.  Last echo in 12/14 showed EF 45-50%.  In 8/15, he developed atypical chest pain.  Lexiscan Cardiolite showed no ischemia, and echo showed stable EF 45-50%.    Recently, he has been stable symptomatically.   He has not had any chest pain. He can walk on flat ground without dyspnea at a slow pace but is short of breath walking up a hill or if he walks fast.  This is chronic.  Patient continues to have chronic low back pain and hip pain that is worse with standing and walking.  He has had numerous operations on his C-spine and L-spine.  He has L-spine stenosis. He has been able to do some golfing. He has had frequent PVCs chronically but does not feel them.  He has been unable to tolerate any inhalers other than albuterol for his COPD due to throat swelling. Weight is down 4 lbs.    Labs (3/12): K 5, creatinine 1.05, LDL 42, HDL 41 Labs (6/13): K 3.9, creatinine 1.04, LDL 44, HDL 67 Labs (4/14): K 5.2, creatinine 1.06, LDL 46, HDL 42 Labs (4/15): K 4.6, creatinine 1.02, LDL 30, HDL 39 Labs (1/16): LDL 42, HDL 42  ECG: NSR, old inferior MI  PMH: 1. CAD: NSTEMI in 12/10.  Patient had DES x 2 to RCA on 03/17/09. Myoview in 8/11 showed EF 58%, no ischemia or infarction. Unstable angina in 5/13.  Patient had DES to proximal LCX and cutting balloon angioplasty to proximal RCA in-stent restenosis with 80-90% stenosis in a small OM1 also  noted.  EF at cath was 55%.  Lexiscan Cardiolite (8/15) with EF 45%, small fixed inferior defect consistent with prior MI.  2. Hyperlipidemia 3. CLL 4. Erectile dysfunction 5. BPH 6. COPD: uses oxygen at night.  7. C-spine surgery after trauma in 2005 and 2008.  8. AAA: PCP follows with serial ultrasounds.  Aortic US (4/15) with 3.2 cm AAA. 9. Appendectomy 10. GERD 11. L-spine disc disease and spinal stenosis with multiple surgeries.  Has chronic low back and leg pain.  12. Ischemic cardiomyopathy: echo (9/12) with EF 45-50%. LV-gram with EF 55% in 5/13.  Echo (12/14) with EF 45-50%.  Echo (8/15) with EF 45-50%, moderate LVH, inferior/inferoseptal hypokinesis.  13. Carotid stenosis: 6/13 carotids with 60-79% RICA stenosis.  12/13 40-59% RICA stenosis.  82/42 35-36% LICA stenosis. 12/15 40-59% RICA stenosis.  14/43 15-40% LICA stenosis.  14. Hyperkalemia with ACEI 15. OSA: Intolerant of CPAP, uses mouthpiece.  16. PVCs  SH: Stopped smoking in 2008.  Married, lives in [place].   [place]: Father died of lung cancer.  Mother with dementia.   ROS: All systems reviewed and negative except as per HPI.    Current Outpatient Prescriptions  Medication Sig Dispense Refill  . albuterol (PROAIR HFA) 108 (90 BASE) MCG/ACT inhaler Inhale 2 puffs into the lungs  every 6 (six) hours as needed. For shortness of breath    . albuterol (PROVENTIL) (2.5 MG/3ML) 0.083% nebulizer solution Take 2.5 mg by nebulization every 6 (six) hours as needed for wheezing or shortness of breath.    Marland Kitchen alendronate (FOSAMAX) 70 MG tablet Take 70 mg by mouth once a week. Take with a full glass of water on an empty stomach.    Marland Kitchen aspirin 81 MG tablet Take 81 mg by mouth daily.     Marland Kitchen atorvastatin (LIPITOR) 20 MG tablet Take 20 mg by mouth daily.    Marland Kitchen CALCIUM-MAGNESIUM-VITAMIN D PO Take 1 tablet by mouth 2 (two) times daily.    Marland Kitchen gabapentin (NEURONTIN) 300 MG capsule Take 600 mg by mouth 2 (two) times daily.     Marland Kitchen latanoprost (XALATAN)  0.005 % ophthalmic solution Place 1 drop into both eyes at bedtime.     . metoprolol succinate (TOPROL-XL) 25 MG 24 hr tablet Take 25 mg by mouth every morning.     . nitroGLYCERIN (NITROSTAT) 0.4 MG SL tablet Place 0.4 mg under the tongue every 5 (five) minutes as needed. For chest pain    . Tamsulosin HCl (FLOMAX) 0.4 MG CAPS Take 0.4 mg by mouth every evening.     . timolol (BETIMOL) 0.5 % ophthalmic solution Place 1 drop into both eyes 2 (two) times daily.     No current facility-administered medications for this visit.    BP 110/62 mmHg  Pulse 87  Ht '5\' 11"'$  (1.803 m)  Wt 214 lb (97.07 kg)  BMI 29.86 kg/m2 General: NAD Neck: No JVD, no thyromegaly or thyroid nodule.  Lungs: Slightly decreased breath sounds bilaterally.  CV: Nondisplaced PMI.  Heart regular S1/S2, no S3/S4, no murmur.  No peripheral edema.  Soft left carotid bruit.  1+ PT pulses bilaterally. Bilateral lower leg venous varicosities.  Abdomen: Soft, nontender, no hepatosplenomegaly, no distention.  Neurologic: Alert and oriented x 3.  Psych: Normal affect. Extremities: No clubbing or cyanosis.   Assessment/Plan:  CAD  Stable status post PCI in 5/13 with no exertional chest pain and stable exertional dyspnea.  He will continue ASA 81 mg daily, Toprol XL 25 mg daily, Crestor.  He has not been able to tolerate ACEI in the past due to hyperkalemia, and he has had fatigue with > 25 mg daily Toprol XL.   Ischemic cardiomyopathy Last echo in 2015 with EF 45-50%.  NYHA class II symptoms, may be more due to COPD.  He is not volume overloaded.  Will get repeat echo to reassess. COPD I suspect that [name]'s exertional dyspnea is due more to COPD than to his heart.  He has not qualified for oxygen during the day but is using nocturnal oxygen.  He needs repeat overnight oximetry to make sure that he still needs oxygen, will arrange.  Carotid stenosis  Stable mild to moderate RICA stenosis.  Followup carotids in 12/17.   Hyperlipidemia  To get lipids at PCP's office in 4/17, [name] will ask for a copy to be routed our way.   Loralie Champagne 05/18/2015

## 2015-06-02 ENCOUNTER — Other Ambulatory Visit: Payer: Self-pay

## 2015-06-02 ENCOUNTER — Ambulatory Visit (HOSPITAL_COMMUNITY): Payer: Medicare Other | Attending: Cardiology

## 2015-06-02 DIAGNOSIS — Z955 Presence of coronary angioplasty implant and graft: Secondary | ICD-10-CM | POA: Insufficient documentation

## 2015-06-02 DIAGNOSIS — Z87891 Personal history of nicotine dependence: Secondary | ICD-10-CM | POA: Insufficient documentation

## 2015-06-02 DIAGNOSIS — I059 Rheumatic mitral valve disease, unspecified: Secondary | ICD-10-CM | POA: Diagnosis not present

## 2015-06-02 DIAGNOSIS — I255 Ischemic cardiomyopathy: Secondary | ICD-10-CM | POA: Diagnosis not present

## 2015-06-02 DIAGNOSIS — I493 Ventricular premature depolarization: Secondary | ICD-10-CM | POA: Insufficient documentation

## 2015-06-02 DIAGNOSIS — R0602 Shortness of breath: Secondary | ICD-10-CM | POA: Diagnosis not present

## 2015-06-02 DIAGNOSIS — G4733 Obstructive sleep apnea (adult) (pediatric): Secondary | ICD-10-CM | POA: Insufficient documentation

## 2015-06-02 DIAGNOSIS — I77810 Thoracic aortic ectasia: Secondary | ICD-10-CM | POA: Diagnosis not present

## 2015-06-02 DIAGNOSIS — I714 Abdominal aortic aneurysm, without rupture: Secondary | ICD-10-CM | POA: Diagnosis not present

## 2015-06-02 DIAGNOSIS — I119 Hypertensive heart disease without heart failure: Secondary | ICD-10-CM | POA: Diagnosis not present

## 2015-06-02 DIAGNOSIS — E785 Hyperlipidemia, unspecified: Secondary | ICD-10-CM | POA: Insufficient documentation

## 2015-06-02 DIAGNOSIS — R0600 Dyspnea, unspecified: Secondary | ICD-10-CM | POA: Diagnosis present

## 2015-06-02 DIAGNOSIS — K759 Inflammatory liver disease, unspecified: Secondary | ICD-10-CM | POA: Insufficient documentation

## 2015-06-05 ENCOUNTER — Telehealth (HOSPITAL_COMMUNITY): Payer: Self-pay | Admitting: Vascular Surgery

## 2015-06-05 NOTE — Telephone Encounter (Signed)
Left pt message to make f/u appt w/ [facility] 

## 2015-07-16 ENCOUNTER — Other Ambulatory Visit: Payer: Self-pay | Admitting: Internal Medicine

## 2015-07-16 DIAGNOSIS — I714 Abdominal aortic aneurysm, without rupture, unspecified: Secondary | ICD-10-CM

## 2015-07-18 ENCOUNTER — Other Ambulatory Visit (HOSPITAL_COMMUNITY): Payer: Self-pay | Admitting: *Deleted

## 2015-07-18 ENCOUNTER — Ambulatory Visit (HOSPITAL_COMMUNITY)
Admission: RE | Admit: 2015-07-18 | Discharge: 2015-07-18 | Disposition: A | Discharge status: Home / Self Care | Payer: Medicare Other | Source: Ambulatory Visit | Attending: Cardiology | Admitting: Cardiology

## 2015-07-18 ENCOUNTER — Ambulatory Visit
Admission: RE | Admit: 2015-07-18 | Discharge: 2015-07-18 | Disposition: A | Discharge status: Home / Self Care | Payer: Medicare Other | Source: Ambulatory Visit | Attending: Internal Medicine | Admitting: Internal Medicine

## 2015-07-18 ENCOUNTER — Encounter (HOSPITAL_COMMUNITY): Payer: Self-pay

## 2015-07-18 VITALS — BP 126/74 | HR 81 | Wt 214.1 lb

## 2015-07-18 DIAGNOSIS — Z7983 Long term (current) use of bisphosphonates: Secondary | ICD-10-CM | POA: Insufficient documentation

## 2015-07-18 DIAGNOSIS — Z955 Presence of coronary angioplasty implant and graft: Secondary | ICD-10-CM | POA: Insufficient documentation

## 2015-07-18 DIAGNOSIS — I6521 Occlusion and stenosis of right carotid artery: Secondary | ICD-10-CM | POA: Insufficient documentation

## 2015-07-18 DIAGNOSIS — I255 Ischemic cardiomyopathy: Secondary | ICD-10-CM | POA: Diagnosis not present

## 2015-07-18 DIAGNOSIS — Z7982 Long term (current) use of aspirin: Secondary | ICD-10-CM | POA: Insufficient documentation

## 2015-07-18 DIAGNOSIS — Z79899 Other long term (current) drug therapy: Secondary | ICD-10-CM | POA: Diagnosis not present

## 2015-07-18 DIAGNOSIS — K219 Gastro-esophageal reflux disease without esophagitis: Secondary | ICD-10-CM | POA: Insufficient documentation

## 2015-07-18 DIAGNOSIS — Z856 Personal history of leukemia: Secondary | ICD-10-CM | POA: Insufficient documentation

## 2015-07-18 DIAGNOSIS — Z9981 Dependence on supplemental oxygen: Secondary | ICD-10-CM | POA: Insufficient documentation

## 2015-07-18 DIAGNOSIS — J432 Centrilobular emphysema: Secondary | ICD-10-CM | POA: Diagnosis not present

## 2015-07-18 DIAGNOSIS — I714 Abdominal aortic aneurysm, without rupture, unspecified: Secondary | ICD-10-CM

## 2015-07-18 DIAGNOSIS — E785 Hyperlipidemia, unspecified: Secondary | ICD-10-CM | POA: Insufficient documentation

## 2015-07-18 DIAGNOSIS — G4733 Obstructive sleep apnea (adult) (pediatric): Secondary | ICD-10-CM | POA: Insufficient documentation

## 2015-07-18 DIAGNOSIS — I252 Old myocardial infarction: Secondary | ICD-10-CM | POA: Insufficient documentation

## 2015-07-18 DIAGNOSIS — I2510 Atherosclerotic heart disease of native coronary artery without angina pectoris: Secondary | ICD-10-CM | POA: Diagnosis not present

## 2015-07-18 DIAGNOSIS — J449 Chronic obstructive pulmonary disease, unspecified: Secondary | ICD-10-CM | POA: Diagnosis not present

## 2015-07-18 DIAGNOSIS — Z87891 Personal history of nicotine dependence: Secondary | ICD-10-CM | POA: Diagnosis not present

## 2015-07-18 DIAGNOSIS — I422 Other hypertrophic cardiomyopathy: Secondary | ICD-10-CM

## 2015-07-18 MED ORDER — SACUBITRIL-VALSARTAN 24-26 MG PO TABS: 1.0000 | ORAL_TABLET | Freq: Two times a day (BID) | ORAL | Status: DC

## 2015-07-18 NOTE — Patient Instructions (Signed)
START Entresto 24/26 tablet twice daily.  Will schedule you for a [employer] and lab work at [facility]. Address: [address] ([address]), [place], [place] [address]  Phone: [phone] Please wear comfortable clothes and shoes for this test. Avoid heavy meal before the test (light snack/meal recommended). Avoid caffeine, alcohol, tobacco products 8 hrs before test. Please give 24 hr notice for cancellations/rescheduling: [phone]  Follow up 6 weeks with Dr. Aundra Dubin.  Do the following things EVERYDAY: 1) Weigh yourself in the morning before breakfast. Write it down and keep it in a log. 2) Take your medicines as prescribed 3) Eat low salt foods-Limit salt (sodium) to 2000 mg per day.  4) Stay as active as you can everyday 5) Limit all fluids for the day to less than 2 liters 6)   Low potassium foods-see attachment 7)   Check blood pressure every day    Potassium Content of Foods Potassium is a mineral found in many foods and drinks. It helps keep fluids and minerals balanced in your body and affects how steadily your heart beats. Potassium also helps control your blood pressure and keep your muscles and nervous system healthy. Certain health conditions and medicines may change the balance of potassium in your body. When this happens, you can help balance your level of potassium through the foods that you do or do not eat. Your health care provider or dietitian may recommend an amount of potassium that you should have each day. The following lists of foods provide the amount of potassium (in parentheses) per serving in each item. HIGH IN POTASSIUM  The following foods and beverages have 200 mg or more of potassium per serving:  Apricots, 2 raw or 5 dry (200 mg).  Artichoke, 1 medium (345 mg).  Avocado, raw,  each (245 mg).  Banana, 1 medium (425 mg).  Beans, lima, or baked beans, canned,  cup (280 mg).  Beans, white, canned,  cup (595 mg).  Beef roast, 3 oz (320  mg).  Beef, ground, 3 oz (270 mg).  Beets, raw or cooked,  cup (260 mg).  Bran muffin, 2 oz (300 mg).  Broccoli,  cup (230 mg).  Brussels sprouts,  cup (250 mg).  Cantaloupe,  cup (215 mg).  Cereal, 100% bran,  cup (200-400 mg).  Cheeseburger, single, fast food, 1 each (225-400 mg).  Chicken, 3 oz (220 mg).  Clams, canned, 3 oz (535 mg).  Crab, 3 oz (225 mg).  Dates, 5 each (270 mg).  Dried beans and peas,  cup (300-475 mg).  Figs, dried, 2 each (260 mg).  Fish: halibut, tuna, cod, snapper, 3 oz (480 mg).  Fish: salmon, haddock, swordfish, perch, 3 oz (300 mg).  Fish, tuna, canned 3 oz (200 mg).  [place] fries, fast food, 3 oz (470 mg).  Granola with fruit and nuts,  cup (200 mg).  Grapefruit juice,  cup (200 mg).  Greens, beet,  cup (655 mg).  Honeydew melon,  cup (200 mg).  Kale, raw, 1 cup (300 mg).  Kiwi, 1 medium (240 mg).  Kohlrabi, rutabaga, parsnips,  cup (280 mg).  Lentils,  cup (365 mg).  Mango, 1 each (325 mg).  Milk, chocolate, 1 cup (420 mg).  Milk: nonfat, low-fat, whole, buttermilk, 1 cup (350-380 mg).  Molasses, 1 Tbsp (295 mg).  Mushrooms,  cup (280) mg.  Nectarine, 1 each (275 mg).  Nuts: almonds, peanuts, hazelnuts, [place], cashew, mixed, 1 oz (200 mg).  Nuts, pistachios, 1 oz (295  mg).  Orange, 1 each (240 mg).  Orange juice,  cup (235 mg).  Papaya, medium,  fruit (390 mg).  Peanut butter, chunky, 2 Tbsp (240 mg).  Peanut butter, smooth, 2 Tbsp (210 mg).  Pear, 1 medium (200 mg).  Pomegranate, 1 whole (400 mg).  Pomegranate juice,  cup (215 mg).  Pork, 3 oz (350 mg).  Potato chips, salted, 1 oz (465 mg).  Potato, baked with skin, 1 medium (925 mg).  Potatoes, boiled,  cup (255 mg).  Potatoes, mashed,  cup (330 mg).  Prune juice,  cup (370 mg).  Prunes, 5 each (305 mg).  Pudding, chocolate,  cup (230 mg).  Pumpkin, canned,  cup (250 mg).  Raisins, seedless,  cup (270  mg).  Seeds, sunflower or pumpkin, 1 oz (240 mg).  Soy milk, 1 cup (300 mg).  Spinach,  cup (420 mg).  Spinach, canned,  cup (370 mg).  Sweet potato, baked with skin, 1 medium (450 mg).  Swiss chard,  cup (480 mg).  Tomato or vegetable juice,  cup (275 mg).  Tomato sauce or puree,  cup (400-550 mg).  Tomato, raw, 1 medium (290 mg).  Tomatoes, canned,  cup (200-300 mg).  [place], 3 oz (250 mg).  Wheat germ, 1 oz (250 mg).  Winter squash,  cup (250 mg).  Yogurt, plain or fruited, 6 oz (260-435 mg).  Zucchini,  cup (220 mg). MODERATE IN POTASSIUM The following foods and beverages have 50-200 mg of potassium per serving:  Apple, 1 each (150 mg).  Apple juice,  cup (150 mg).  Applesauce,  cup (90 mg).  Apricot nectar,  cup (140 mg).  Asparagus, small spears,  cup or 6 spears (155 mg).  Bagel, cinnamon raisin, 1 each (130 mg).  Bagel, egg or plain, 4 in., 1 each (70 mg).  Beans, green,  cup (90 mg).  Beans, yellow,  cup (190 mg).  Beer, regular, 12 oz (100 mg).  Beets, canned,  cup (125 mg).  Blackberries,  cup (115 mg).  Blueberries,  cup (60 mg).  Bread, whole wheat, 1 slice (70 mg).  Broccoli, raw,  cup (145 mg).  Cabbage,  cup (150 mg).  Carrots, cooked or raw,  cup (180 mg).  Cauliflower, raw,  cup (150 mg).  Celery, raw,  cup (155 mg).  Cereal, bran flakes, cup (120-150 mg).  Cheese, cottage,  cup (110 mg).  Cherries, 10 each (150 mg).  Chocolate, 1 oz bar (165 mg).  Coffee, brewed 6 oz (90 mg).  Corn,  cup or 1 ear (195 mg).  Cucumbers,  cup (80 mg).  Egg, large, 1 each (60 mg).  Eggplant,  cup (60 mg).  Endive, raw, cup (80 mg).  English muffin, 1 each (65 mg).  Fish, orange roughy, 3 oz (150 mg).  Frankfurter, beef or pork, 1 each (75 mg).  Fruit cocktail,  cup (115 mg).  Grape juice,  cup (170 mg).  Grapefruit,  fruit (175 mg).  Grapes,  cup (155 mg).  Greens: kale, turnip,  collard,  cup (110-150 mg).  Ice cream or frozen yogurt, chocolate,  cup (175 mg).  Ice cream or frozen yogurt, vanilla,  cup (120-150 mg).  Lemons, limes, 1 each (80 mg).  Lettuce, all types, 1 cup (100 mg).  Mixed vegetables,  cup (150 mg).  Mushrooms, raw,  cup (110 mg).  Nuts: walnuts, pecans, or macadamia, 1 oz (125 mg).  Oatmeal,  cup (80 mg).  Okra,  cup (110 mg).  Onions, raw,  cup (120 mg).  Peach, 1 each (185 mg).  Peaches, canned,  cup (120 mg).  Pears, canned,  cup (120 mg).  Peas, green, frozen,  cup (90 mg).  Peppers, green,  cup (130 mg).  Peppers, red,  cup (160 mg).  Pineapple juice,  cup (165 mg).  Pineapple, fresh or canned,  cup (100 mg).  Plums, 1 each (105 mg).  Pudding, vanilla,  cup (150 mg).  Raspberries,  cup (90 mg).  Rhubarb,  cup (115 mg).  Rice, wild,  cup (80 mg).  Shrimp, 3 oz (155 mg).  Spinach, raw, 1 cup (170 mg).  Strawberries,  cup (125 mg).  Summer squash  cup (175-200 mg).  Swiss chard, raw, 1 cup (135 mg).  Tangerines, 1 each (140 mg).  Tea, brewed, 6 oz (65 mg).  Turnips,  cup (140 mg).  Watermelon,  cup (85 mg).  Wine, red, table, 5 oz (180 mg).  Wine, white, table, 5 oz (100 mg). LOW IN POTASSIUM The following foods and beverages have less than 50 mg of potassium per serving.  Bread, white, 1 slice (30 mg).  Carbonated beverages, 12 oz (less than 5 mg).  Cheese, 1 oz (20-30 mg).  Cranberries,  cup (45 mg).  Cranberry juice cocktail,  cup (20 mg).  Fats and oils, 1 Tbsp (less than 5 mg).  Hummus, 1 Tbsp (32 mg).  Nectar: papaya, mango, or pear,  cup (35 mg).  Rice, white or brown,  cup (50 mg).  Spaghetti or macaroni,  cup cooked (30 mg).  Tortilla, flour or corn, 1 each (50 mg).  Waffle, 4 in., 1 each (50 mg).  Water chestnuts,  cup (40 mg).   This information is not intended to replace advice given to you by your health care provider. Make sure you  discuss any questions you have with your health care provider.   Document Released: 10/13/2004 Document Revised: 03/06/2013 Document Reviewed: 01/26/2013 Elsevier Interactive Patient Education [employer].

## 2015-07-20 NOTE — Progress Notes (Signed)
Patient ID: [name], male   DOB: [birth year], 80 y.o.   MRN: [id number] PCP: Dr. Lysle Rubens Cardiology: Dr. Aundra Dubin  80 yo with history of CAD s/p NSTEMI/PCI in 1/11 and unstable angina/PCI in 5/13 as well as L-spine stenosis with multiple prior back surgeries and COPD presents for cardiology followup.  He had 2 DES placed in the RCA in 1/11 at the time of his NSTEMI.  He was on Effient until 2/12.  He had a myoview in 8/11 that showed no ischemia or infarction.  He was admitted with unstable angina in 5/13 and had DES to proximal LCX and cutting balloon angioplasty to proximal RCA in-stent restenosis.  Last echo in 12/14 showed EF 45-50%.  In 8/15, he developed atypical chest pain.  Lexiscan Cardiolite showed no ischemia, and echo showed stable EF 45-50%.    Most recent echo in 3/17 showed fall in EF to 30-35% with diffuse hypokinesis.  He reports no chest pain.   He has stable mild dyspnea after walking 100 yards or so.  No chest pain.  More limited by hip pain than dyspnea.  He can walk 1/2 mile on a treadmill in about 7 minutes. No orthopnea/PND.  He plays golf without problems.  SBP running 120s-130s at home, weight stable.  Oxygen saturation 88% after walking in.  We walked him in the office today and oxygen saturation stayed > 88%.   Labs (3/12): K 5, creatinine 1.05, LDL 42, HDL 41 Labs (6/13): K 3.9, creatinine 1.04, LDL 44, HDL 67 Labs (4/14): K 5.2, creatinine 1.06, LDL 46, HDL 42 Labs (4/15): K 4.6, creatinine 1.02, LDL 30, HDL 39 Labs (1/16): LDL 42, HDL 42 Labs (5/17): LDL 50, HDL 44, K 4.9, creatinine 1.07, HCT 45.7  PMH: 1. CAD: NSTEMI in 12/10.  Patient had DES x 2 to RCA on 03/17/09. Myoview in 8/11 showed EF 58%, no ischemia or infarction. Unstable angina in 5/13.  Patient had DES to proximal LCX and cutting balloon angioplasty to proximal RCA in-stent restenosis with 80-90% stenosis in a small OM1 also noted.  EF at cath was 55%.  Lexiscan Cardiolite (8/15) with EF 45%, small fixed  inferior defect consistent with prior MI.  2. Hyperlipidemia 3. CLL 4. Erectile dysfunction 5. BPH 6. COPD: uses oxygen at night.  7. C-spine surgery after trauma in 2005 and 2008.  8. AAA: PCP follows with serial ultrasounds.  Aortic US (4/15) with 3.2 cm AAA.  Abdominal US (5/17) with 3.3 cm AAA.  9. Appendectomy 10. GERD 11. L-spine disc disease and spinal stenosis with multiple surgeries.  Has chronic low back and leg pain.  12. Ischemic cardiomyopathy: echo (9/12) with EF 45-50%. LV-gram with EF 55% in 5/13.  Echo (12/14) with EF 45-50%.  Echo (8/15) with EF 45-50%, moderate LVH, inferior/inferoseptal hypokinesis.  - Echo (3/17) with EF 30-35%, diffuse hypokinesis.  13. Carotid stenosis: 6/13 carotids with 60-79% RICA stenosis.  12/13 40-59% RICA stenosis.  27/03 50-09% LICA stenosis. 12/15 40-59% RICA stenosis.  38/18 29-93% LICA stenosis.  14. Hyperkalemia with ACEI 15. OSA: Intolerant of CPAP, uses mouthpiece.  16. PVCs  SH: Stopped smoking in 2008.  Married, lives in [place].   [place]: Father died of lung cancer.  Mother with dementia.   ROS: All systems reviewed and negative except as per HPI.    Current Outpatient Prescriptions  Medication Sig Dispense Refill  . albuterol (PROAIR HFA) 108 (90 BASE) MCG/ACT inhaler Inhale 2 puffs into the lungs every 6 (  six) hours as needed. For shortness of breath    . albuterol (PROVENTIL) (2.5 MG/3ML) 0.083% nebulizer solution Take 2.5 mg by nebulization every 6 (six) hours as needed for wheezing or shortness of breath.    Marland Kitchen alendronate (FOSAMAX) 70 MG tablet Take 70 mg by mouth once a week. Take with a full glass of water on an empty stomach.    Marland Kitchen aspirin 81 MG tablet Take 81 mg by mouth daily.     Marland Kitchen atorvastatin (LIPITOR) 20 MG tablet Take 20 mg by mouth daily.    Marland Kitchen CALCIUM-MAGNESIUM-VITAMIN D PO Take 1 tablet by mouth 2 (two) times daily.    . dorzolamide-timolol (COSOPT) 22.3-6.8 MG/ML ophthalmic solution Place 1 drop into both eyes 2  (two) times daily.    Marland Kitchen gabapentin (NEURONTIN) 300 MG capsule Take 600 mg by mouth 2 (two) times daily.     . Lactobacillus (FLORAJEN ACIDOPHILUS PO) Take 20 mg by mouth daily.    Marland Kitchen latanoprost (XALATAN) 0.005 % ophthalmic solution Place 1 drop into both eyes at bedtime.     . metoprolol succinate (TOPROL-XL) 25 MG 24 hr tablet Take 25 mg by mouth every morning.     . nitroGLYCERIN (NITROSTAT) 0.4 MG SL tablet Place 0.4 mg under the tongue every 5 (five) minutes as needed. For chest pain    . Tamsulosin HCl (FLOMAX) 0.4 MG CAPS Take 0.4 mg by mouth every evening.     . sacubitril-valsartan (ENTRESTO) 24-26 MG Take 1 tablet by mouth 2 (two) times daily. 60 tablet 0   No current facility-administered medications for this encounter.    BP 126/74 mmHg  Pulse 81  Wt 214 lb 1.9 oz (97.124 kg)  SpO2 88% General: NAD Neck: No JVD, no thyromegaly or thyroid nodule.  Lungs: Slightly decreased breath sounds bilaterally.  CV: Nondisplaced PMI.  Heart regular S1/S2, no S3/S4, no murmur.  No peripheral edema.  Soft left carotid bruit.  1+ PT pulses bilaterally. Bilateral lower leg venous varicosities.  Abdomen: Soft, nontender, no hepatosplenomegaly, no distention.  Neurologic: Alert and oriented x 3.  Psych: Normal affect. Extremities: No clubbing or cyanosis.   Assessment/Plan:  CAD  Stable status post PCI in 5/13 with no exertional chest pain and stable exertional dyspnea.  He will continue ASA 81 mg daily, Toprol XL 25 mg daily, Crestor.  He has not been able to tolerate ACEI in the past due to hyperkalemia, and he has had fatigue with > 25 mg daily Toprol XL.   - Given fall in EF on most recent echo, I will arrange for Lexiscan Cardiolite to assess for ischemia.   Ischemic cardiomyopathy Last echo in 3/17 with fall in EF to 30-35%.  NYHA class II symptoms, dyspnea may be more due to COPD.  He is not volume overloaded.   - Continue Toprol XL.  - Add Entresto 24/26 bid.  Need to be careful with  K, asked him to cut back on K in diet.  BMET 1 week.  COPD I suspect that Mr Marcou's exertional dyspnea is due more to COPD than to his heart.  He has not qualified for oxygen during the day (sats > 88% today) but is using nocturnal oxygen.   Carotid stenosis  Stable mild to moderate RICA stenosis.  Followup carotids in 12/17.  Hyperlipidemia  Good lipids when recently checked.  Followup in 6 wks.    Loralie Champagne 07/20/2015

## 2015-07-22 ENCOUNTER — Telehealth (HOSPITAL_COMMUNITY): Payer: Self-pay

## 2015-07-22 NOTE — Telephone Encounter (Signed)
Wife left VM on CHF clinic triage line c/o low BP 91/63 after starting Entresto 24/46 yesterday afternoon. Left return VM, per Dr. Aundra Dubin okay to continue [facility] as long as he is not symptomatic/dizzy/excessively fatigued, etc. Advised to call us back and let us know if patient was symptomatic or not to advise whether or not to have patient continue or discontinue medication.  Renee Pain

## 2015-07-23 ENCOUNTER — Encounter (HOSPITAL_COMMUNITY): Payer: Self-pay

## 2015-07-23 NOTE — Progress Notes (Signed)
Sleep study results faxed to Dr. Denton Ar Husain's office per Dr. Aundra Dubin request.  Renee Pain

## 2015-07-24 ENCOUNTER — Telehealth (HOSPITAL_COMMUNITY): Payer: Self-pay | Admitting: *Deleted

## 2015-07-24 NOTE — Telephone Encounter (Signed)
Patient given detailed instructions per Myocardial Perfusion Study Information Sheet for the test on 07/1715 at 0945. Patient notified to arrive 15 minutes early and that it is imperative to arrive on time for appointment to keep from having the test rescheduled.  If you need to cancel or reschedule your appointment, please call the office within 24 hours of your appointment. Failure to do so may result in a cancellation of your appointment, and a $50 no show fee. Patient verbalized understanding.Shellsea Borunda, Ranae Palms

## 2015-07-29 ENCOUNTER — Encounter: Payer: Self-pay | Admitting: Internal Medicine

## 2015-07-30 ENCOUNTER — Ambulatory Visit (HOSPITAL_COMMUNITY): Payer: Medicare Other | Attending: Cardiology

## 2015-07-30 DIAGNOSIS — R002 Palpitations: Secondary | ICD-10-CM | POA: Diagnosis not present

## 2015-07-30 DIAGNOSIS — R0609 Other forms of dyspnea: Secondary | ICD-10-CM | POA: Insufficient documentation

## 2015-07-30 DIAGNOSIS — I10 Essential (primary) hypertension: Secondary | ICD-10-CM | POA: Diagnosis not present

## 2015-07-30 DIAGNOSIS — I422 Other hypertrophic cardiomyopathy: Secondary | ICD-10-CM | POA: Diagnosis present

## 2015-07-30 LAB — MYOCARDIAL PERFUSION IMAGING
LV dias vol: 74 mL (ref 62–150)
LV sys vol: 33 mL
Peak HR: 96 {beats}/min
RATE: 0.35
Rest HR: 79 {beats}/min
SDS: 2
SRS: 9
SSS: 11
TID: 0.87

## 2015-07-30 MED ORDER — REGADENOSON 0.4 MG/5ML IV SOLN
0.4000 mg | Freq: Once | INTRAVENOUS | Status: AC
  Administered 2015-07-30: 0.4 mg via INTRAVENOUS

## 2015-07-30 MED ORDER — TECHNETIUM TC 99M TETROFOSMIN IV KIT
10.4000 | PACK | Freq: Once | INTRAVENOUS | Status: AC | PRN
  Administered 2015-07-30: 10 via INTRAVENOUS
  Filled 2015-07-30: qty 10

## 2015-07-30 MED ORDER — TECHNETIUM TC 99M TETROFOSMIN IV KIT
32.4000 | PACK | Freq: Once | INTRAVENOUS | Status: AC | PRN
  Administered 2015-07-30: 32 via INTRAVENOUS
  Filled 2015-07-30: qty 32

## 2015-08-14 ENCOUNTER — Telehealth (HOSPITAL_COMMUNITY): Payer: Self-pay

## 2015-08-14 NOTE — Telephone Encounter (Signed)
Multiple attempts made to reach patient by phone, 2nd VM left today with details of stable myocardial study results.  Renee Pain

## 2015-09-03 ENCOUNTER — Encounter (HOSPITAL_COMMUNITY): Payer: Medicare Other

## 2015-09-09 ENCOUNTER — Ambulatory Visit
Admission: RE | Admit: 2015-09-09 | Discharge: 2015-09-09 | Disposition: A | Discharge status: Home / Self Care | Payer: Medicare Other | Source: Ambulatory Visit | Attending: Nurse Practitioner | Admitting: Nurse Practitioner

## 2015-09-09 ENCOUNTER — Other Ambulatory Visit: Payer: Self-pay | Admitting: Nurse Practitioner

## 2015-09-09 DIAGNOSIS — R05 Cough: Secondary | ICD-10-CM

## 2015-09-09 DIAGNOSIS — R059 Cough, unspecified: Secondary | ICD-10-CM

## 2015-11-11 ENCOUNTER — Encounter: Payer: Medicare Other | Attending: Cardiology | Admitting: Respiratory Therapy

## 2015-11-11 VITALS — Ht 71.0 in | Wt 212.7 lb

## 2015-11-11 DIAGNOSIS — J449 Chronic obstructive pulmonary disease, unspecified: Secondary | ICD-10-CM | POA: Diagnosis not present

## 2015-11-11 DIAGNOSIS — I5022 Chronic systolic (congestive) heart failure: Secondary | ICD-10-CM

## 2015-11-11 NOTE — Progress Notes (Signed)
Pulmonary Individual Treatment Plan  Patient Details  Name: [name] MRN: [id number] Date of Birth: 30-Jun-1935 Referring Provider:   Flowsheet Row Pulmonary Rehab from 11/11/2015 in [facility] Cardiac and Pulmonary Rehab  Referring Provider  Einar Gip      Initial Encounter Date:  Flowsheet Row Pulmonary Rehab from 11/11/2015 in [facility] Cardiac and Pulmonary Rehab  Date  11/11/15  Referring Provider  Einar Gip      Visit Diagnosis: COPD, moderate ([place])  Chronic systolic congestive heart failure ([place])  Patient's Home Medications on Admission:  Current Outpatient Prescriptions:    albuterol (PROAIR HFA) 108 (90 BASE) MCG/ACT inhaler, Inhale 2 puffs into the lungs every 6 (six) hours as needed. For shortness of breath, Disp: , Rfl:    albuterol (PROVENTIL) (2.5 MG/3ML) 0.083% nebulizer solution, Take 2.5 mg by nebulization every 6 (six) hours as needed for wheezing or shortness of breath., Disp: , Rfl:    alendronate (FOSAMAX) 70 MG tablet, Take 70 mg by mouth once a week. Take with a full glass of water on an empty stomach., Disp: , Rfl:    aspirin 81 MG tablet, Take 81 mg by mouth daily. , Disp: , Rfl:    atorvastatin (LIPITOR) 20 MG tablet, Take 20 mg by mouth daily., Disp: , Rfl:    CALCIUM-MAGNESIUM-VITAMIN D PO, Take 1 tablet by mouth 2 (two) times daily., Disp: , Rfl:    dorzolamide-timolol (COSOPT) 22.3-6.8 MG/ML ophthalmic solution, Place 1 drop into both eyes 2 (two) times daily., Disp: , Rfl:    gabapentin (NEURONTIN) 300 MG capsule, Take 600 mg by mouth 2 (two) times daily. , Disp: , Rfl:    Lactobacillus (FLORAJEN ACIDOPHILUS PO), Take 20 mg by mouth daily., Disp: , Rfl:    latanoprost (XALATAN) 0.005 % ophthalmic solution, Place 1 drop into both eyes at bedtime. , Disp: , Rfl:    metoprolol succinate (TOPROL-XL) 25 MG 24 hr tablet, Take 25 mg by mouth every morning. , Disp: , Rfl:    nitroGLYCERIN (NITROSTAT) 0.4 MG SL tablet, Place 0.4 mg under the tongue every 5 (five)  minutes as needed. For chest pain, Disp: , Rfl:    sacubitril-valsartan (ENTRESTO) 24-26 MG, Take 1 tablet by mouth 2 (two) times daily., Disp: 60 tablet, Rfl: 0   Tamsulosin HCl (FLOMAX) 0.4 MG CAPS, Take 0.4 mg by mouth every evening. , Disp: , Rfl:   Past Medical History: Past Medical History:  Diagnosis Date   AAA (abdominal aortic aneurysm) (HCC)    measures 2.7 cm.   Back pain    BPH (benign prostatic hypertrophy)    Bruises easily    Cervical spine fracture ([place]) feb 1990   C 6, C 7 and T 1, no surgery done wore halo brace   CLL (chronic lymphocytic leukemia) ([place])    hx of worked up 2009 by dr Benay Spice, no tx done, released by dr sherrill   Complication of anesthesia    cannot lie on right side gets vertigo and trouble turning neck to right   COPD (chronic obstructive pulmonary disease) ([place]) may 2005   Erectile dysfunction    Fatigue    GERD (gastroesophageal reflux disease)    Glaucoma    Hepatitis as child   serum hepatitis   History of oxygen administration    oxygen  '@2'$   l/m nasally at bedtime.   Hyperlipidemia    Hypertension    Impaired hearing    wears Hearing aids   Leg pain    Lumbar  disc disease    Microscopic hematuria    Myocardial infarct ([place]) 03-17-2009   Hx MI 2000   Neuropathy (HCC)    both feet and legs   Osteopenia     Tobacco Use: History  Smoking Status   Former Smoker   Packs/day: 2.00   Years: 55.00   Types: Cigarettes   Quit date: 07/14/2006  Smokeless Tobacco   Never Used    Labs: Recent Review Flowsheet Data    Labs for ITP Cardiac and Pulmonary Rehab Latest Ref Rng & Units 09/06/2008 08/11/2011 08/12/2011 01/29/2013 03/20/2014   Cholestrol 0 - 200 mg/dL - - 128 100 104   LDLCALC 0 - 99 mg/dL - - 44 33 42   HDL >39.00 mg/dL - - 67 40.40 41.70   Trlycerides 0.0 - 149.0 mg/dL - - 83 131.0 100.0   PHART 7.350 - 7.450 7.506(H) - - - -   PCO2ART 35.0 - 45.0 mmHg 32.8(L) - - - -   HCO3 20.0 - 24.0 mEq/L  25.7(H) - - - -   TCO2 0 - 100 mmol/L 26.7 31 - - -   O2SAT % 99.8 - - - -       ADL UCSD:     Pulmonary Assessment Scores    Row Name 11/11/15 1239         ADL UCSD   ADL Phase Entry     SOB Score total 22     Rest 0     Walk 1     Stairs 3     Bath 0     Dress 0     Shop 2        Pulmonary Function Assessment:     Pulmonary Function Assessment - 11/11/15 1236      Initial Spirometry Results   FEV1% 46 %   Comments Spirometry 11/11/15     Post Bronchodilator Spirometry Results   FVC% 75 %   FEV1% 50 %   FEV1/FVC Ratio 63.79     Breath   Bilateral Breath Sounds Decreased;Clear   Shortness of Breath Yes;Limiting activity      Exercise Target Goals: Date: 11/11/15  Exercise Program Goal: Individual exercise prescription set with THRR, safety & activity barriers. Participant demonstrates ability to understand and report RPE using BORG scale, to self-measure pulse accurately, and to acknowledge the importance of the exercise prescription.  Exercise Prescription Goal: Starting with aerobic activity 30 plus minutes a day, 3 days per week for initial exercise prescription. Provide home exercise prescription and guidelines that participant acknowledges understanding prior to discharge.  Activity Barriers & Risk Stratification:     Activity Barriers & Cardiac Risk Stratification - 11/11/15 1236      Activity Barriers & Cardiac Risk Stratification   Activity Barriers Shortness of Breath;Deconditioning   Cardiac Risk Stratification Moderate      6 Minute Walk:     6 Minute Walk    Row Name 11/11/15 1520         6 Minute Walk   Distance 1155 feet     Walk Time 6 minutes     MPH 2.18     METS 2.18     RPE 14     Perceived Dyspnea  4     Resting HR 76 bpm     Resting BP 106/68     Max Ex. HR 100 bpm     Max Ex. BP 130/72       Interval HR  Baseline HR 76     1 Minute HR 87     2 Minute HR 97     3 Minute HR 98     4 Minute HR 100     5  Minute HR 100     6 Minute HR 98     Interval Heart Rate? Yes       Interval Oxygen   Interval Oxygen? Yes     Baseline Oxygen Saturation % 92 %     1 Minute Oxygen Saturation % 87 %     2 Minute Oxygen Saturation % 86 %     3 Minute Oxygen Saturation % 86 %     4 Minute Oxygen Saturation % 86 %     5 Minute Oxygen Saturation % 86 %     6 Minute Oxygen Saturation % 85 %     2 Minute Post Oxygen Saturation % 95 %        Initial Exercise Prescription:     Initial Exercise Prescription - 11/11/15 1500      Date of Initial Exercise RX and Referring Provider   Date 11/11/15   Referring Provider Ganji     Treadmill   MPH 1.5   Grade 0   Minutes 15   METs 2.15     Bike   Level 1   Minutes 15   METs 2     REL-XR   Level 2   Minutes 15   METs 2     Prescription Details   Frequency (times per week) 3   Duration Progress to 45 minutes of aerobic exercise without signs/symptoms of physical distress     Intensity   THRR 40-80% of Max Heartrate 102-129   Ratings of Perceived Exertion 11-13   Perceived Dyspnea 0-4     Progression   Progression Continue to progress workloads to maintain intensity without signs/symptoms of physical distress.     Resistance Training   Training Prescription Yes   Weight 2   Reps 10-15      Perform Capillary Blood Glucose checks as needed.  Exercise Prescription Changes:   Exercise Comments:   Discharge Exercise Prescription (Final Exercise Prescription Changes):    Nutrition:  Target Goals: Understanding of nutrition guidelines, daily intake of sodium '1500mg'$ , cholesterol '200mg'$ , calories 30% from fat and 7% or less from saturated fats, daily to have 5 or more servings of fruits and vegetables.  Biometrics:     Pre Biometrics - 11/11/15 1519      Pre Biometrics   Height '5\' 11"'$  (1.803 m)   Weight 212 lb 11.2 oz (96.5 kg)   Waist Circumference 45 inches   Hip Circumference 44 inches   Waist to Hip Ratio 1.02 %   BMI  (Calculated) 29.7       Nutrition Therapy Plan and Nutrition Goals:     Nutrition Therapy & Goals - 11/11/15 1245      Nutrition Therapy   Diet Prefers not to meet with the dietitian; Mrs Hochstatter does most of the cooking; he watches his portion size and eats many vegetables and meat; no fried foods, nut, or cheese.      Nutrition Discharge: Rate Your Plate Scores:   Psychosocial: Target Goals: Acknowledge presence or absence of depression, maximize coping skills, provide positive support system. Participant is able to verbalize types and ability to use techniques and skills needed for reducing stress and depression.  Initial Review & Psychosocial Screening:  Initial Psych Review & Screening - 11/11/15 March [facility]? Yes   Comments Mr Currie has good wupport from his wife. He is a [place] and has "no worries". He has an oximeter and does check his O2Sat's. When they are low, he stops the activity and is not planning to wear oxygen, except at night. I am hoping through education , he will learn the importance of oxygen with activity.     Barriers   Psychosocial barriers to participate in program There are no identifiable barriers or psychosocial needs.;The patient should benefit from training in stress management and relaxation.     Screening Interventions   Interventions Encouraged to exercise      Quality of Life Scores:     Quality of Life - 11/11/15 1259      Quality of Life Scores   Health/Function Pre 20.75 %   Socioeconomic Pre 21 %   Psych/Spiritual Pre 21 %   Family Pre 21 %   GLOBAL Pre 20.89 %      PHQ-9: Recent Review Flowsheet Data    Depression screen [facility] 2/9 11/11/2015   Decreased Interest 0   Down, Depressed, Hopeless 0   PHQ - 2 Score 0   Altered sleeping 0   Tired, decreased energy 1   Change in appetite 0   Feeling bad or failure about yourself  0   Trouble concentrating 0   Moving slowly or  fidgety/restless 0   Suicidal thoughts 0   PHQ-9 Score 1   Difficult doing work/chores Not difficult at all      Psychosocial Evaluation and Intervention:   Psychosocial Re-Evaluation:  Education: Education Goals: Education classes will be provided on a weekly basis, covering required topics. Participant will state understanding/return demonstration of topics presented.  Learning Barriers/Preferences:     Learning Barriers/Preferences - 11/11/15 1236      Learning Barriers/Preferences   Learning Barriers None   Learning Preferences Group Instruction;Individual Instruction;Pictoral;Skilled Demonstration;Verbal Instruction;Video;Written Material      Education Topics: Initial Evaluation Education: - Verbal, written and demonstration of respiratory meds, RPE/PD scales, oximetry and breathing techniques. Instruction on use of nebulizers and MDIs: cleaning and proper use, rinsing mouth with steroid doses and importance of monitoring MDI activations. Flowsheet Row Pulmonary Rehab from 11/11/2015 in [facility] Cardiac and Pulmonary Rehab  Date  11/11/15  Educator  LB  Instruction Review Code  2- meets goals/outcomes      General Nutrition Guidelines/Fats and Fiber: -Group instruction provided by verbal, written material, models and posters to present the general guidelines for heart healthy nutrition. Gives an explanation and review of dietary fats and fiber.   Controlling Sodium/Reading Food Labels: -Group verbal and written material supporting the discussion of sodium use in heart healthy nutrition. Review and explanation with models, verbal and written materials for utilization of the food label.   Exercise Physiology & Risk Factors: - Group verbal and written instruction with models to review the exercise physiology of the cardiovascular system and associated critical values. Details cardiovascular disease risk factors and the goals associated with each risk factor.   Aerobic  Exercise & Resistance Training: - Gives group verbal and written discussion on the health impact of inactivity. On the components of aerobic and resistive training programs and the benefits of this training and how to safely progress through these programs.   Flexibility, Balance, General Exercise Guidelines: - Provides group verbal and written instruction on the benefits  of flexibility and balance training programs. Provides general exercise guidelines with specific guidelines to those with heart or lung disease. Demonstration and skill practice provided.   Stress Management: - Provides group verbal and written instruction about the health risks of elevated stress, cause of high stress, and healthy ways to reduce stress.   Depression: - Provides group verbal and written instruction on the correlation between heart/lung disease and depressed mood, treatment options, and the stigmas associated with seeking treatment.   Exercise & Equipment Safety: - Individual verbal instruction and demonstration of equipment use and safety with use of the equipment.   Infection Prevention: - Provides verbal and written material to individual with discussion of infection control including proper hand washing and proper equipment cleaning during exercise session. Flowsheet Row Pulmonary Rehab from 11/11/2015 in [facility] Cardiac and Pulmonary Rehab  Date  11/11/15  Educator  LB  Instruction Review Code  2- meets goals/outcomes      Falls Prevention: - Provides verbal and written material to individual with discussion of falls prevention and safety. Flowsheet Row Pulmonary Rehab from 11/11/2015 in [facility] Cardiac and Pulmonary Rehab  Date  11/11/15  Educator  LB  Instruction Review Code  2- meets goals/outcomes      Diabetes: - Individual verbal and written instruction to review signs/symptoms of diabetes, desired ranges of glucose level fasting, after meals and with exercise. Advice that pre and post  exercise glucose checks will be done for 3 sessions at entry of program.   Chronic Lung Diseases: - Group verbal and written instruction to review new updates, new respiratory medications, new advancements in procedures and treatments. Provide informative websites and "800" numbers of self-education.   Lung Procedures: - Group verbal and written instruction to describe testing methods done to diagnose lung disease. Review the outcome of test results. Describe the treatment choices: Pulmonary Function Tests, ABGs and oximetry.   Energy Conservation: - Provide group verbal and written instruction for methods to conserve energy, plan and organize activities. Instruct on pacing techniques, use of adaptive equipment and posture/positioning to relieve shortness of breath.   Triggers: - Group verbal and written instruction to review types of environmental controls: home humidity, furnaces, filters, dust mite/pet prevention, HEPA vacuums. To discuss weather changes, air quality and the benefits of nasal washing.   Exacerbations: - Group verbal and written instruction to provide: warning signs, infection symptoms, calling MD promptly, preventive modes, and value of vaccinations. Review: effective airway clearance, coughing and/or vibration techniques. Create an Sports administrator.   Oxygen: - Individual and group verbal and written instruction on oxygen therapy. Includes supplement oxygen, available portable oxygen systems, continuous and intermittent flow rates, oxygen safety, concentrators, and Medicare reimbursement for oxygen.   Respiratory Medications: - Group verbal and written instruction to review medications for lung disease. Drug class, frequency, complications, importance of spacers, rinsing mouth after steroid MDI's, and proper cleaning methods for nebulizers. Flowsheet Row Pulmonary Rehab from 11/11/2015 in [facility] Cardiac and Pulmonary Rehab  Date  11/11/15  Educator  LB  Instruction Review  Code  2- meets goals/outcomes      AED/CPR: - Group verbal and written instruction with the use of models to demonstrate the basic use of the AED with the basic ABC's of resuscitation.   Breathing Retraining: - Provides individuals verbal and written instruction on purpose, frequency, and proper technique of diaphragmatic breathing and pursed-lipped breathing. Applies individual practice skills. Flowsheet Row Pulmonary Rehab from 11/11/2015 in [facility] Cardiac and Pulmonary Rehab  Date  11/11/15  Educator  LB  Instruction Review Code  2- meets goals/outcomes      Anatomy and Physiology of the Lungs: - Group verbal and written instruction with the use of models to provide basic lung anatomy and physiology related to function, structure and complications of lung disease.   Heart Failure: - Group verbal and written instruction on the basics of heart failure: signs/symptoms, treatments, explanation of ejection fraction, enlarged heart and cardiomyopathy.   Sleep Apnea: - Individual verbal and written instruction to review Obstructive Sleep Apnea. Review of risk factors, methods for diagnosing and types of masks and machines for OSA.   Anxiety: - Provides group, verbal and written instruction on the correlation between heart/lung disease and anxiety, treatment options, and management of anxiety.   Relaxation: - Provides group, verbal and written instruction about the benefits of relaxation for patients with heart/lung disease. Also provides patients with examples of relaxation techniques.   Knowledge Questionnaire Score:     Knowledge Questionnaire Score - 11/11/15 1236      Knowledge Questionnaire Score   Pre Score 8/10       Core Components/Risk Factors/Patient Goals at Admission:     Personal Goals and Risk Factors at Admission - 11/11/15 1246      Core Components/Risk Factors/Patient Goals on Admission   Sedentary Yes  Mr Brumbaugh does play golf - riding cart. He has a  Personal assistant at [employer] and home equipment: treadmill and stationary bike.   Intervention Provide advice, education, support and counseling about physical activity/exercise needs.;Develop an individualized exercise prescription for aerobic and resistive training based on initial evaluation findings, risk stratification, comorbidities and participant's personal goals.   Expected Outcomes Achievement of increased cardiorespiratory fitness and enhanced flexibility, muscular endurance and strength shown through measurements of functional capacity and personal statement of participant.   Increase Strength and Stamina Yes   Intervention Provide advice, education, support and counseling about physical activity/exercise needs.;Develop an individualized exercise prescription for aerobic and resistive training based on initial evaluation findings, risk stratification, comorbidities and participant's personal goals.   Expected Outcomes Achievement of increased cardiorespiratory fitness and enhanced flexibility, muscular endurance and strength shown through measurements of functional capacity and personal statement of participant.   Improve shortness of breath with ADL's Yes   Intervention Provide education, individualized exercise plan and daily activity instruction to help decrease symptoms of SOB with activities of daily living.   Expected Outcomes Short Term: Achieves a reduction of symptoms when performing activities of daily living.   Develop more efficient breathing techniques such as purse lipped breathing and diaphragmatic breathing; and practicing self-pacing with activity Yes   Intervention Provide education, demonstration and support about specific breathing techniuqes utilized for more efficient breathing. Include techniques such as pursed lipped breathing, diaphragmatic breathing and self-pacing activity.   Expected Outcomes Short Term: Participant will be able to demonstrate and use  breathing techniques as needed throughout daily activities.   Increase knowledge of respiratory medications and ability to use respiratory devices properly  Yes  Albuterol MDI; spacer given; SVN Albuterol; Oxygen at night 2l/m   Intervention Provide education and demonstration as needed of appropriate use of medications, inhalers, and oxygen therapy.   Expected Outcomes Short Term: Achieves understanding of medications use. Understands that oxygen is a medication prescribed by physician. Demonstrates appropriate use of inhaler and oxygen therapy.   Heart Failure Yes   Intervention Provide a combined exercise and nutrition program that is supplemented with education, support and counseling  about heart failure. Directed toward relieving symptoms such as shortness of breath, decreased exercise tolerance, and extremity edema.   Expected Outcomes Improve functional capacity of life;Short term: Attendance in program 2-3 days a week with increased exercise capacity. Reported lower sodium intake. Reported increased fruit and vegetable intake. Reports medication compliance.;Short term: Daily weights obtained and reported for increase. Utilizing diuretic protocols set by physician.;Long term: Adoption of self-care skills and reduction of barriers for early signs and symptoms recognition and intervention leading to self-care maintenance.      Core Components/Risk Factors/Patient Goals Review:    Core Components/Risk Factors/Patient Goals at Discharge (Final Review):    ITP Comments:   Comments: Mr Lamm plans to start [facility] on 11/19/2015 and attend 3 days/week. He has a $40.00 copay with each visit, so he plans to transition to [employer]ne[facility] at the [facility] in a few weeks.

## 2015-11-11 NOTE — Patient Instructions (Signed)
Patient Instructions  Patient Details  Name: [name] MRN: [id number] Date of Birth: June 29, 1935 Referring Provider:  Adrian Prows, MD  Below are the personal goals you chose as well as exercise and nutrition goals. Our goal is to help you keep on track towards obtaining and maintaining your goals. We will be discussing your progress on these goals with you throughout the program.  Initial Exercise Prescription:     Initial Exercise Prescription - 11/11/15 1500      Date of Initial Exercise RX and Referring Provider   Date 11/11/15   Referring Provider Ganji     Treadmill   MPH 1.5   Grade 0   Minutes 15   METs 2.15     Bike   Level 1   Minutes 15   METs 2     REL-XR   Level 2   Minutes 15   METs 2     Prescription Details   Frequency (times per week) 3   Duration Progress to 45 minutes of aerobic exercise without signs/symptoms of physical distress     Intensity   THRR 40-80% of Max Heartrate 102-129   Ratings of Perceived Exertion 11-13   Perceived Dyspnea 0-4     Progression   Progression Continue to progress workloads to maintain intensity without signs/symptoms of physical distress.     Resistance Training   Training Prescription Yes   Weight 2   Reps 10-15      Exercise Goals: Frequency: Be able to perform aerobic exercise three times per week working toward 3-5 days per week.  Intensity: Work with a perceived exertion of 11 (fairly light) - 15 (hard) as tolerated. Follow your new exercise prescription and watch for changes in prescription as you progress with the program. Changes will be reviewed with you when they are made.  Duration: You should be able to do 30 minutes of continuous aerobic exercise in addition to a 5 minute warm-up and a 5 minute cool-down routine.  Nutrition Goals: Your personal nutrition goals will be established when you do your nutrition analysis with the dietician.  The following are nutrition guidelines to  follow: Cholesterol < '200mg'$ /day Sodium < '1500mg'$ /day Fiber: Women over 50 yrs - 21 grams per day  Personal Goals:     Personal Goals and Risk Factors at Admission - 11/11/15 1246      Core Components/Risk Factors/Patient Goals on Admission   Sedentary Yes  Mr Staffa does play golf - riding cart. He has a Personal assistant at [employer] and home equipment: treadmill and stationary bike.   Intervention Provide advice, education, support and counseling about physical activity/exercise needs.;Develop an individualized exercise prescription for aerobic and resistive training based on initial evaluation findings, risk stratification, comorbidities and participant's personal goals.   Expected Outcomes Achievement of increased cardiorespiratory fitness and enhanced flexibility, muscular endurance and strength shown through measurements of functional capacity and personal statement of participant.   Increase Strength and Stamina Yes   Intervention Provide advice, education, support and counseling about physical activity/exercise needs.;Develop an individualized exercise prescription for aerobic and resistive training based on initial evaluation findings, risk stratification, comorbidities and participant's personal goals.   Expected Outcomes Achievement of increased cardiorespiratory fitness and enhanced flexibility, muscular endurance and strength shown through measurements of functional capacity and personal statement of participant.   Improve shortness of breath with ADL's Yes   Intervention Provide education, individualized exercise plan and daily activity instruction to help decrease symptoms of SOB  with activities of daily living.   Expected Outcomes Short Term: Achieves a reduction of symptoms when performing activities of daily living.   Develop more efficient breathing techniques such as purse lipped breathing and diaphragmatic breathing; and practicing self-pacing with activity Yes    Intervention Provide education, demonstration and support about specific breathing techniuqes utilized for more efficient breathing. Include techniques such as pursed lipped breathing, diaphragmatic breathing and self-pacing activity.   Expected Outcomes Short Term: Participant will be able to demonstrate and use breathing techniques as needed throughout daily activities.   Increase knowledge of respiratory medications and ability to use respiratory devices properly  Yes  Albuterol MDI; spacer given; SVN Albuterol; Oxygen at night 2l/m   Intervention Provide education and demonstration as needed of appropriate use of medications, inhalers, and oxygen therapy.   Expected Outcomes Short Term: Achieves understanding of medications use. Understands that oxygen is a medication prescribed by physician. Demonstrates appropriate use of inhaler and oxygen therapy.   Heart Failure Yes   Intervention Provide a combined exercise and nutrition program that is supplemented with education, support and counseling about heart failure. Directed toward relieving symptoms such as shortness of breath, decreased exercise tolerance, and extremity edema.   Expected Outcomes Improve functional capacity of life;Short term: Attendance in program 2-3 days a week with increased exercise capacity. Reported lower sodium intake. Reported increased fruit and vegetable intake. Reports medication compliance.;Short term: Daily weights obtained and reported for increase. Utilizing diuretic protocols set by physician.;Long term: Adoption of self-care skills and reduction of barriers for early signs and symptoms recognition and intervention leading to self-care maintenance.      Tobacco Use Initial Evaluation: History  Smoking Status   Former Smoker   Packs/day: 2.00   Years: 55.00   Types: Cigarettes   Quit date: 07/14/2006  Smokeless Tobacco   Never Used    Copy of goals given to participant.

## 2015-11-19 ENCOUNTER — Encounter: Payer: Medicare Other | Attending: Cardiology | Admitting: *Deleted

## 2015-11-19 DIAGNOSIS — J449 Chronic obstructive pulmonary disease, unspecified: Secondary | ICD-10-CM

## 2015-11-19 DIAGNOSIS — I5022 Chronic systolic (congestive) heart failure: Secondary | ICD-10-CM

## 2015-11-19 NOTE — Progress Notes (Signed)
Daily Session Note  Patient Details  Name: [name] MRN: [id number] Date of Birth: 04-Jun-1935 Referring Provider:   April Manson Pulmonary Rehab from 11/11/2015 in [facility] Cardiac and Pulmonary Rehab  Referring Provider  Einar Gip      Encounter Date: 11/19/2015  Check In:     Session Check In - 11/19/15 1138      Check-In   Location [facility]-Cardiac & Pulmonary Rehab   Staff Present Alberteen Sam, MA, ACSM RCEP, Exercise Physiologist;Amanda Oletta Darter, BA, ACSM CEP, Exercise Physiologist;Laureen Owens Shark, BS, RRT, Respiratory Therapist   Supervising physician immediately available to respond to emergencies [facility] immediately available ER MD   Physician(s) Drs. Malinda and Williams   Medication changes reported     No   Fall or balance concerns reported    No   Warm-up and Cool-down Performed as group-led Location manager Performed Yes   VAD Patient? No     Pain Assessment   Currently in Pain? No/denies   Multiple Pain Sites No         Goals Met:  Proper associated with RPD/PD & O2 Sat Exercise tolerated well Queuing for purse lip breathing Strength training completed today  Goals Unmet:  Not Applicable  Comments: First full day of exercise!  Patient was oriented to gym and equipment including functions, settings, policies, and procedures.  Patient's individual exercise prescription and treatment plan were reviewed.  All starting workloads were established based on the results of the 6 minute walk test done at initial orientation visit.  The plan for exercise progression was also introduced and progression will be customized based on patient's performance and goals.    Dr. Emily Filbert is Medical Director for [place] and [facility].

## 2015-11-21 ENCOUNTER — Encounter: Payer: Medicare Other | Admitting: *Deleted

## 2015-11-21 DIAGNOSIS — J449 Chronic obstructive pulmonary disease, unspecified: Secondary | ICD-10-CM | POA: Diagnosis not present

## 2015-11-21 NOTE — Progress Notes (Signed)
Daily Session Note  Patient Details  Name: [name] MRN: [id number] Date of Birth: October 29, 1935 Referring Provider:   Flowsheet Row Pulmonary Rehab from 11/11/2015 in [facility] Cardiac and Pulmonary Rehab  Referring Provider  Einar Gip      Encounter Date: 11/21/2015  Check In:     Session Check In - 11/21/15 1054      Check-In   Location [facility]-Cardiac & Pulmonary Rehab   Staff Present Heath Lark, RN, BSN, CCRP;Jerame Hedding, RN, Michaela Corner, RRT, RCP, Respiratory Therapist   Supervising physician immediately available to respond to emergencies [facility] immediately available ER MD   Physician(s) Dr. Corky Downs and Dr. Burlene Arnt   Medication changes reported     No   Fall or balance concerns reported    No   Warm-up and Cool-down Performed on first and last piece of equipment   Resistance Training Performed Yes   VAD Patient? No     Pain Assessment   Currently in Pain? No/denies         Goals Met:  Proper associated with RPD/PD & O2 Sat Exercise tolerated well  Goals Unmet:  Not Applicable  Comments:     Dr. Emily Filbert is Medical Director for [place] and [facility].

## 2015-11-24 ENCOUNTER — Encounter: Payer: Medicare Other | Admitting: *Deleted

## 2015-11-24 DIAGNOSIS — J449 Chronic obstructive pulmonary disease, unspecified: Secondary | ICD-10-CM | POA: Diagnosis not present

## 2015-11-24 NOTE — Progress Notes (Signed)
Daily Session Note  Patient Details  Name: [name] MRN: [id number] Date of Birth: [birth year] Referring Provider:   Flowsheet Row Pulmonary Rehab from 11/11/2015 in [facility] Cardiac and Pulmonary Rehab  Referring Provider  Ganji      Encounter Date: 11/24/2015  Check In:     Session Check In - 11/24/15 1014      Check-In   Location [facility]-Cardiac & Pulmonary Rehab   Staff Present Carson Myrtle, BS, RRT, Respiratory Therapist;Jakyle Petrucelli Amedeo Plenty, BS, ACSM CEP, Exercise Physiologist;Amanda Oletta Darter, BA, ACSM CEP, Exercise Physiologist   Supervising physician immediately available to respond to emergencies [facility] immediately available ER MD   Physician(s) Dr. Quentin Cornwall and Dr. Jimmye Norman   Medication changes reported     No   Fall or balance concerns reported    No   Warm-up and Cool-down Performed on first and last piece of equipment   Resistance Training Performed Yes   VAD Patient? No     Pain Assessment   Currently in Pain? No/denies   Multiple Pain Sites No         Goals Met:  Proper associated with RPD/PD & O2 Sat Independence with exercise equipment Exercise tolerated well Strength training completed today  Goals Unmet:  Not Applicable  Comments: Pt able to follow exercise prescription today without complaint.  Will continue to monitor for progression.    Dr. Emily Filbert is Medical Director for [place] and [facility].

## 2015-11-26 DIAGNOSIS — J449 Chronic obstructive pulmonary disease, unspecified: Secondary | ICD-10-CM

## 2015-11-26 DIAGNOSIS — I5022 Chronic systolic (congestive) heart failure: Secondary | ICD-10-CM

## 2015-11-26 NOTE — Progress Notes (Addendum)
Daily Session Note  Patient Details  Name: LENARD KAMPF MRN: [id number] Date of Birth: 12-16-1935 Referring Provider:   April Manson Pulmonary Rehab from 11/11/2015 in [facility] Cardiac and Pulmonary Rehab  Referring Provider  Ganji      Encounter Date: 11/26/2015  Check In:     Session Check In - 11/26/15 1218      Check-In   Location [facility]-Cardiac & Pulmonary Rehab   Staff Present Carson Myrtle, BS, RRT, Respiratory Lennie Hummer, MA, ACSM RCEP, Exercise Physiologist;Raevin Wierenga Oletta Darter, BA, ACSM CEP, Exercise Physiologist           Exercise Prescription Changes - 11/25/15 1448      Exercise Review   Progression Yes     Response to Exercise   Blood Pressure (Admit) 130/78   Blood Pressure (Exercise) 140/80   Blood Pressure (Exit) 118/66   Heart Rate (Admit) 78 bpm   Heart Rate (Exercise) 107 bpm   Heart Rate (Exit) 74 bpm   Oxygen Saturation (Admit) 93 %   Oxygen Saturation (Exercise) 86 %   Oxygen Saturation (Exit) 94 %   Rating of Perceived Exertion (Exercise) 13   Perceived Dyspnea (Exercise) 4   Symptoms none   Duration Progress to 45 minutes of aerobic exercise without signs/symptoms of physical distress   Intensity THRR unchanged     Progression   Progression Continue to progress workloads to maintain intensity without signs/symptoms of physical distress.   Average METs 2.26     Resistance Training   Training Prescription Yes   Weight 3 lbs   Reps 10-15     Interval Training   Interval Training No     Treadmill   MPH 2   Grade 0   Minutes 15   METs 2.53     Bike   Level 8  changed to cybex bike   Minutes 15   METs --  64 rpm     REL-XR   Level 5   Minutes 15   METs --  65 spm      Goals Met:  Proper associated with RPD/PD & O2 Sat Independence with exercise equipment Exercise tolerated well Strength training completed today  Goals Unmet:  Not Applicable  Comments: Pt able to follow exercise prescription today without  complaint.  Will continue to monitor for progression.  Reviewed home exercise with pt today.  Pt plans to go to [facility] and work in yard for exercise.  Reviewed THR, pulse, RPE, sign and symptoms, and when to call 911 or MD.  Also discussed weather considerations and indoor options.  Pt encouraged to monitor his oxygen levels closely since they have a tendency to drop when walking.  Pt voiced understanding. Alberteen Sam, [place], ACSM RCEP [phone] 11/26/2015 2:05 PM    Dr. Emily Filbert is Medical Director for [place] and [facility].

## 2015-11-28 NOTE — Progress Notes (Signed)
Discharge Summary  Patient Details  Name: [name] MRN: [id number] Date of Birth: 12/25/1935 Referring Provider:   Flowsheet Row Pulmonary Rehab from 11/11/2015 in [facility] Cardiac and Pulmonary Rehab  Referring Provider  [place]       Number of Visits: 5  Reason for Discharge:  Early Exit:  Insurance and Personal  Smoking History:  History  Smoking Status  . Former Smoker  . Packs/day: 2.00  . Years: 55.00  . Types: Cigarettes  . Quit date: 07/14/2006  Smokeless Tobacco  . Never Used    Diagnosis:  COPD, moderate ([place])  Chronic systolic congestive heart failure ([place])  ADL [facility]:     Pulmonary Assessment Scores    Row Name 11/11/15 1239         ADL UCSD   ADL Phase Entry     SOB Score total 22     Rest 0     Walk 1     Stairs 3     Bath 0     Dress 0     Shop 2        Initial Exercise Prescription:     Initial Exercise Prescription - 11/11/15 1500      Date of Initial Exercise RX and Referring Provider   Date 11/11/15   Referring Provider Ganji     Treadmill   MPH 1.5   Grade 0   Minutes 15   METs 2.15     Bike   Level 1   Minutes 15   METs 2     REL-XR   Level 2   Minutes 15   METs 2     Prescription Details   Frequency (times per week) 3   Duration Progress to 45 minutes of aerobic exercise without signs/symptoms of physical distress     Intensity   THRR 40-80% of Max Heartrate 102-129   Ratings of Perceived Exertion 11-13   Perceived Dyspnea 0-4     Progression   Progression Continue to progress workloads to maintain intensity without signs/symptoms of physical distress.     Resistance Training   Training Prescription Yes   Weight 2   Reps 10-15      Discharge Exercise Prescription (Final Exercise Prescription Changes):     Exercise Prescription Changes - 11/26/15 1200      Response to Exercise   Comments Home exercise given 11/26/15     Home Exercise Plan   Plans to continue exercise at [employer]  (comment)  [employer]   Frequency Add 3 additional days to program exercise sessions.  graduating Lw early      Functional Capacity:     [place] Name 11/11/15 1520         6 Minute Walk   Distance 1155 feet     Walk Time 6 minutes     MPH 2.18     METS 2.18     RPE 14     Perceived Dyspnea  4     Resting HR 76 bpm     Resting BP 106/68     Max Ex. HR 100 bpm     Max Ex. BP 130/72       Interval HR   Baseline HR 76     1 Minute HR 87     2 Minute HR 97     3 Minute HR 98     4 Minute HR 100     5 Minute  HR 100     6 Minute HR 98     Interval Heart Rate? Yes       Interval Oxygen   Interval Oxygen? Yes     Baseline Oxygen Saturation % 92 %     1 Minute Oxygen Saturation % 87 %     2 Minute Oxygen Saturation % 86 %     3 Minute Oxygen Saturation % 86 %     4 Minute Oxygen Saturation % 86 %     5 Minute Oxygen Saturation % 86 %     6 Minute Oxygen Saturation % 85 %     2 Minute Post Oxygen Saturation % 95 %        Psychological, QOL, Others - Outcomes: PHQ 2/9: Depression screen PHQ 2/9 11/11/2015  Decreased Interest 0  Down, Depressed, Hopeless 0  PHQ - 2 Score 0  Altered sleeping 0  Tired, decreased energy 1  Change in appetite 0  Feeling bad or failure about yourself  0  Trouble concentrating 0  Moving slowly or fidgety/restless 0  Suicidal thoughts 0  PHQ-9 Score 1  Difficult doing work/chores Not difficult at all    Quality of Life:     Quality of Life - 11/11/15 1259      Quality of Life Scores   Health/Function Pre 20.75 %   Socioeconomic Pre 21 %   Psych/Spiritual Pre 21 %   Family Pre 21 %   GLOBAL Pre 20.89 %      Personal Goals: Goals established at orientation with interventions provided to work toward goal.     Personal Goals and Risk Factors at Admission - 11/11/15 1246      Core Components/Risk Factors/Patient Goals on Admission   Sedentary Yes  [name] does play golf - riding cart. He has a Medical sales representative at [employer] and home equipment: treadmill and stationary bike.   Intervention Provide advice, education, support and counseling about physical activity/exercise needs.;Develop an individualized exercise prescription for aerobic and resistive training based on initial evaluation findings, risk stratification, comorbidities and participant's personal goals.   Expected Outcomes Achievement of increased cardiorespiratory fitness and enhanced flexibility, muscular endurance and strength shown through measurements of functional capacity and personal statement of participant.   Increase Strength and Stamina Yes   Intervention Provide advice, education, support and counseling about physical activity/exercise needs.;Develop an individualized exercise prescription for aerobic and resistive training based on initial evaluation findings, risk stratification, comorbidities and participant's personal goals.   Expected Outcomes Achievement of increased cardiorespiratory fitness and enhanced flexibility, muscular endurance and strength shown through measurements of functional capacity and personal statement of participant.   Improve shortness of breath with ADL's Yes   Intervention Provide education, individualized exercise plan and daily activity instruction to help decrease symptoms of SOB with activities of daily living.   Expected Outcomes Short Term: Achieves a reduction of symptoms when performing activities of daily living.   Develop more efficient breathing techniques such as purse lipped breathing and diaphragmatic breathing; and practicing self-pacing with activity Yes   Intervention Provide education, demonstration and support about specific breathing techniuqes utilized for more efficient breathing. Include techniques such as pursed lipped breathing, diaphragmatic breathing and self-pacing activity.   Expected Outcomes Short Term: Participant will be able to demonstrate and use breathing techniques  as needed throughout daily activities.   Increase knowledge of respiratory medications and ability to use respiratory devices properly  Yes  Albuterol MDI; spacer  given; SVN Albuterol; Oxygen at night 2l/m   Intervention Provide education and demonstration as needed of appropriate use of medications, inhalers, and oxygen therapy.   Expected Outcomes Short Term: Achieves understanding of medications use. Understands that oxygen is a medication prescribed by physician. Demonstrates appropriate use of inhaler and oxygen therapy.   Heart Failure Yes   Intervention Provide a combined exercise and nutrition program that is supplemented with education, support and counseling about heart failure. Directed toward relieving symptoms such as shortness of breath, decreased exercise tolerance, and extremity edema.   Expected Outcomes Improve functional capacity of life;Short term: Attendance in program 2-3 days a week with increased exercise capacity. Reported lower sodium intake. Reported increased fruit and vegetable intake. Reports medication compliance.;Short term: Daily weights obtained and reported for increase. Utilizing diuretic protocols set by physician.;Long term: Adoption of self-care skills and reduction of barriers for early signs and symptoms recognition and intervention leading to self-care maintenance.       Personal Goals Discharge:     Goals and Risk Factor Review    Row Name 11/19/15 1139 11/25/15 0658           Core Components/Risk Factors/Patient Goals Review   Personal Goals Review Develop more efficient breathing techniques such as purse lipped breathing and diaphragmatic breathing and practicing self-pacing with activity. Develop more efficient breathing techniques such as purse lipped breathing and diaphragmatic breathing and practicing self-pacing with activity.;Improve shortness of breath with ADL's      Review Reviewed pursed lip breathing technique with [name] today.  He has a  tendency to drop his oxygen saturation walking and talking.   [name] has had several O2Sat readings in the 86-89% range. He has a home finger oximeter and monitors these O2Sat with activites at home and when playing golf. He does not want to wear oxygen portably, and so he paces himself. He stops the activity until his O2Sat returns to the 90's. Education was done on the importance of O2Sat's in the 90's for lung and heart health.      Expected Outcomes Pt will continue to come to classes to improved techniques for shortness of breath. Continue to monitor O2Sat's with exercise goals and encourage PLB and possible oxygen use.         Nutrition & Weight - Outcomes:     Pre Biometrics - 11/11/15 1519      Pre Biometrics   Height '5\' 11"'$  (1.803 m)   Weight 212 lb 11.2 oz (96.5 kg)   Waist Circumference 45 inches   Hip Circumference 44 inches   Waist to Hip Ratio 1.02 %   BMI (Calculated) 29.7       Nutrition:     Nutrition Therapy & Goals - 11/11/15 1245      Nutrition Therapy   Diet Prefers not to meet with the dietitian; Mrs Severe does most of the cooking; he watches his portion size and eats many vegetables and meat; no fried foods, nut, or cheese.      Nutrition Discharge:   Education Questionnaire Score:     Knowledge Questionnaire Score - 11/11/15 1236      Knowledge Questionnaire Score   Pre Score 8/10      Goals reviewed with patient; copy given to patient.

## 2015-11-28 NOTE — Progress Notes (Signed)
Pulmonary Individual Treatment Plan  Patient Details  Name: [name] MRN: [id number] Date of Birth: [birth year] Referring Provider:   Flowsheet Row Pulmonary Rehab from 11/11/2015 in [facility] Cardiac and Pulmonary Rehab  Referring Provider  Einar Gip      Initial Encounter Date:  Flowsheet Row Pulmonary Rehab from 11/11/2015 in [facility] Cardiac and Pulmonary Rehab  Date  11/11/15  Referring Provider  Einar Gip      Visit Diagnosis: COPD, moderate ([place])  Chronic systolic congestive heart failure ([place])  Patient's Home Medications on Admission:  Current Outpatient Prescriptions:  .  albuterol (PROAIR HFA) 108 (90 BASE) MCG/ACT inhaler, Inhale 2 puffs into the lungs every 6 (six) hours as needed. For shortness of breath, Disp: , Rfl:  .  albuterol (PROVENTIL) (2.5 MG/3ML) 0.083% nebulizer solution, Take 2.5 mg by nebulization every 6 (six) hours as needed for wheezing or shortness of breath., Disp: , Rfl:  .  alendronate (FOSAMAX) 70 MG tablet, Take 70 mg by mouth once a week. Take with a full glass of water on an empty stomach., Disp: , Rfl:  .  aspirin 81 MG tablet, Take 81 mg by mouth daily. , Disp: , Rfl:  .  atorvastatin (LIPITOR) 20 MG tablet, Take 20 mg by mouth daily., Disp: , Rfl:  .  CALCIUM-MAGNESIUM-VITAMIN D PO, Take 1 tablet by mouth 2 (two) times daily., Disp: , Rfl:  .  dorzolamide-timolol (COSOPT) 22.3-6.8 MG/ML ophthalmic solution, Place 1 drop into both eyes 2 (two) times daily., Disp: , Rfl:  .  gabapentin (NEURONTIN) 300 MG capsule, Take 600 mg by mouth 2 (two) times daily. , Disp: , Rfl:  .  Lactobacillus (FLORAJEN ACIDOPHILUS PO), Take 20 mg by mouth daily., Disp: , Rfl:  .  latanoprost (XALATAN) 0.005 % ophthalmic solution, Place 1 drop into both eyes at bedtime. , Disp: , Rfl:  .  metoprolol succinate (TOPROL-XL) 25 MG 24 hr tablet, Take 25 mg by mouth every morning. , Disp: , Rfl:  .  nitroGLYCERIN (NITROSTAT) 0.4 MG SL tablet, Place 0.4 mg under the tongue every 5 (five)  minutes as needed. For chest pain, Disp: , Rfl:  .  sacubitril-valsartan (ENTRESTO) 24-26 MG, Take 1 tablet by mouth 2 (two) times daily., Disp: 60 tablet, Rfl: 0 .  Tamsulosin HCl (FLOMAX) 0.4 MG CAPS, Take 0.4 mg by mouth every evening. , Disp: , Rfl:   Past Medical History: Past Medical History:  Diagnosis Date  . AAA (abdominal aortic aneurysm) (HCC)    measures 2.7 cm.  . Back pain   . BPH (benign prostatic hypertrophy)   . Bruises easily   . Cervical spine fracture ([place]) feb 1990   C 6, C 7 and T 1, no surgery done wore halo brace  . CLL (chronic lymphocytic leukemia) ([place])    hx of worked up 2009 by dr Benay Spice, no tx done, released by dr Benay Spice  . Complication of anesthesia    cannot lie on right side gets vertigo and trouble turning neck to right  . COPD (chronic obstructive pulmonary disease) ([place]) may 2005  . Erectile dysfunction   . Fatigue   . GERD (gastroesophageal reflux disease)   . Glaucoma   . Hepatitis as child   serum hepatitis  . History of oxygen administration    oxygen  _0   l/m nasally at bedtime.  . Hyperlipidemia   . Hypertension   . Impaired hearing    wears Hearing aids  . Leg pain   . Lumbar  disc disease   . Microscopic hematuria   . Myocardial infarct ([place]) 03-17-2009   Hx MI 2000  . Neuropathy (HCC)    both feet and legs  . Osteopenia     Tobacco Use: History  Smoking Status  . Former Smoker  . Packs/day: 2.00  . Years: 55.00  . Types: Cigarettes  . Quit date: 07/14/2006  Smokeless Tobacco  . Never Used    Labs: Recent Review Flowsheet Data    Labs for ITP Cardiac and Pulmonary Rehab Latest Ref Rng & Units 09/06/2008 08/11/2011 08/12/2011 01/29/2013 03/20/2014   Cholestrol 0 - 200 mg/dL - - 128 100 104   LDLCALC 0 - 99 mg/dL - - 44 33 42   HDL >39.00 mg/dL - - 67 40.40 41.70   Trlycerides 0.0 - 149.0 mg/dL - - 83 131.0 100.0   PHART 7.350 - 7.450 7.506(H) - - - -   PCO2ART 35.0 - 45.0 mmHg 32.8(L) - - - -   HCO3 20.0 - 24.0 mEq/L  25.7(H) - - - -   TCO2 0 - 100 mmol/L 26.7 31 - - -   O2SAT % 99.8 - - - -       ADL UCSD:     Pulmonary Assessment Scores    Row Name 11/11/15 1239         ADL UCSD   ADL Phase Entry     SOB Score total 22     Rest 0     Walk 1     Stairs 3     Bath 0     Dress 0     Shop 2        Pulmonary Function Assessment:     Pulmonary Function Assessment - 11/11/15 1236      Initial Spirometry Results   FEV1% 46 %   Comments Spirometry 11/11/15     Post Bronchodilator Spirometry Results   FVC% 75 %   FEV1% 50 %   FEV1/FVC Ratio 63.79     Breath   Bilateral Breath Sounds Decreased;Clear   Shortness of Breath Yes;Limiting activity      Exercise Target Goals:    Exercise Program Goal: Individual exercise prescription set with THRR, safety & activity barriers. Participant demonstrates ability to understand and report RPE using BORG scale, to self-measure pulse accurately, and to acknowledge the importance of the exercise prescription.  Exercise Prescription Goal: Starting with aerobic activity 30 plus minutes a day, 3 days per week for initial exercise prescription. Provide home exercise prescription and guidelines that participant acknowledges understanding prior to discharge.  Activity Barriers & Risk Stratification:     Activity Barriers & Cardiac Risk Stratification - 11/11/15 1236      Activity Barriers & Cardiac Risk Stratification   Activity Barriers Shortness of Breath;Deconditioning   Cardiac Risk Stratification Moderate      6 Minute Walk:     6 Minute Walk    Row Name 11/11/15 1520         6 Minute Walk   Distance 1155 feet     Walk Time 6 minutes     MPH 2.18     METS 2.18     RPE 14     Perceived Dyspnea  4     Resting HR 76 bpm     Resting BP 106/68     Max Ex. HR 100 bpm     Max Ex. BP 130/72       Interval HR  Baseline HR 76     1 Minute HR 87     2 Minute HR 97     3 Minute HR 98     4 Minute HR 100     5 Minute HR 100      6 Minute HR 98     Interval Heart Rate? Yes       Interval Oxygen   Interval Oxygen? Yes     Baseline Oxygen Saturation % 92 %     1 Minute Oxygen Saturation % 87 %     2 Minute Oxygen Saturation % 86 %     3 Minute Oxygen Saturation % 86 %     4 Minute Oxygen Saturation % 86 %     5 Minute Oxygen Saturation % 86 %     6 Minute Oxygen Saturation % 85 %     2 Minute Post Oxygen Saturation % 95 %        Initial Exercise Prescription:     Initial Exercise Prescription - 11/11/15 1500      Date of Initial Exercise RX and Referring Provider   Date 11/11/15   Referring Provider Ganji     Treadmill   MPH 1.5   Grade 0   Minutes 15   METs 2.15     Bike   Level 1   Minutes 15   METs 2     REL-XR   Level 2   Minutes 15   METs 2     Prescription Details   Frequency (times per week) 3   Duration Progress to 45 minutes of aerobic exercise without signs/symptoms of physical distress     Intensity   THRR 40-80% of Max Heartrate 102-129   Ratings of Perceived Exertion 11-13   Perceived Dyspnea 0-4     Progression   Progression Continue to progress workloads to maintain intensity without signs/symptoms of physical distress.     Resistance Training   Training Prescription Yes   Weight 2   Reps 10-15      Perform Capillary Blood Glucose checks as needed.  Exercise Prescription Changes:     Exercise Prescription Changes    Row Name 11/25/15 1400 11/25/15 1448 11/26/15 1200         Exercise Review   Progression -  First Day of Exercise from 11/19/15 Yes  -       Response to Exercise   Blood Pressure (Admit) 118/66 130/78  -     Blood Pressure (Exercise) 124/64 140/80  -     Blood Pressure (Exit) 132/60 118/66  -     Heart Rate (Admit) 79 bpm 78 bpm  -     Heart Rate (Exercise) 117 bpm 107 bpm  -     Heart Rate (Exit) 95 bpm 74 bpm  -     Oxygen Saturation (Admit) 94 % 93 %  -     Oxygen Saturation (Exercise) 84 % 86 %  -     Oxygen Saturation (Exit) 93 %  94 %  -     Rating of Perceived Exertion (Exercise) 14 13  -     Perceived Dyspnea (Exercise) 3 4  -     Symptoms none none  -     Comments  -  - Home exercise given 11/26/15     Duration Progress to 45 minutes of aerobic exercise without signs/symptoms of physical distress Progress to 45 minutes of aerobic exercise without signs/symptoms of  physical distress  -     Intensity THRR unchanged THRR unchanged  -       Progression   Progression Continue to progress workloads to maintain intensity without signs/symptoms of physical distress. Continue to progress workloads to maintain intensity without signs/symptoms of physical distress.  -     Average METs 2.19 2.26  -       Resistance Training   Training Prescription Yes Yes  -     Weight 3 lbs 3 lbs  -     Reps 10-15 10-15  -       Interval Training   Interval Training No No  -       Treadmill   MPH 1.8 2  -     Grade 0 0  -     Minutes 15 15  -     METs 2.38 2.53  -       Bike   Level 1 8  changed to cybex bike  -     Minutes 15 15  -     METs  - -  64 rpm  -       REL-XR   Level 4 5  -     Minutes 15 15  -     METs  - -  65 spm  -       Home Exercise Plan   Plans to continue exercise at  -  - [employer] (comment)  [employer]     Frequency  -  - Add 3 additional days to program exercise sessions.  graduating Lw early        Exercise Comments:     Exercise Comments    Row Name 11/19/15 1142 11/25/15 1445 11/26/15 1404       Exercise Comments  First full day of exercise!  Patient was oriented to gym and equipment including functions, settings, policies, and procedures.  Patient's individual exercise prescription and treatment plan were reviewed.  All starting workloads were established based on the results of the 6 minute walk test done at initial orientation visit.  The plan for exercise progression was also introduced and progression will be customized based on patient's performance and goals. [place] is off to a  good start with exercise.  He has already moved up on the XR.  We will continue to monitor for progression. Reviewed home exercise with pt today.  Pt plans to go to [facility] and work in yard for exercise.  Reviewed THR, pulse, RPE, sign and symptoms, and when to call 911 or MD.  Also discussed weather considerations and indoor options.  Pt encouraged to monitor his oxygen levels closely since they have a tendency to drop when walking.  Pt voiced understanding.        Discharge Exercise Prescription (Final Exercise Prescription Changes):     Exercise Prescription Changes - 11/26/15 1200      Response to Exercise   Comments Home exercise given 11/26/15     Home Exercise Plan   Plans to continue exercise at [facility] (comment)  [employer]   Frequency Add 3 additional days to program exercise sessions.  graduating Lw early       Nutrition:  Target Goals: Understanding of nutrition guidelines, daily intake of sodium <1568m, cholesterol <201m calories 30% from fat and 7% or less from saturated fats, daily to have 5 or more servings of fruits and vegetables.  Biometrics:     Pre Biometrics - 11/11/15 1519  Pre Biometrics   Height _0  (1.803 m)   Weight 212 lb 11.2 oz (96.5 kg)   Waist Circumference 45 inches   Hip Circumference 44 inches   Waist to Hip Ratio 1.02 %   BMI (Calculated) 29.7       Nutrition Therapy Plan and Nutrition Goals:     Nutrition Therapy & Goals - 11/11/15 1245      Nutrition Therapy   Diet Prefers not to meet with the dietitian; Mrs Sedler does most of the cooking; he watches his portion size and eats many vegetables and meat; no fried foods, nut, or cheese.      Nutrition Discharge: Rate Your Plate Scores:   Psychosocial: Target Goals: Acknowledge presence or absence of depression, maximize coping skills, provide positive support system. Participant is able to verbalize types and ability to use techniques and skills needed for reducing  stress and depression.  Initial Review & Psychosocial Screening:     Initial Psych Review & Screening - 11/11/15 [place]? Yes   Comments Mr Rowser has good wupport from his wife. He is a [place] and has "no worries". He has an oximeter and does check his O2Sat's. When they are low, he stops the activity and is not planning to wear oxygen, except at night. I am hoping through education , he will learn the importance of oxygen with activity.     Barriers   Psychosocial barriers to participate in program There are no identifiable barriers or psychosocial needs.;The patient should benefit from training in stress management and relaxation.     Screening Interventions   Interventions Encouraged to exercise      Quality of Life Scores:     Quality of Life - 11/11/15 1259      Quality of Life Scores   Health/Function Pre 20.75 %   Socioeconomic Pre 21 %   Psych/Spiritual Pre 21 %   Family Pre 21 %   GLOBAL Pre 20.89 %      PHQ-9: Recent Review Flowsheet Data    Depression screen [facility] 2/9 11/11/2015   Decreased Interest 0   Down, Depressed, Hopeless 0   PHQ - 2 Score 0   Altered sleeping 0   Tired, decreased energy 1   Change in appetite 0   Feeling bad or failure about yourself  0   Trouble concentrating 0   Moving slowly or fidgety/restless 0   Suicidal thoughts 0   PHQ-9 Score 1   Difficult doing work/chores Not difficult at all      Psychosocial Evaluation and Intervention:     Psychosocial Evaluation - 11/19/15 1121      Psychosocial Evaluation & Interventions   Comments Counselor met with Mr. Calvin today for initial psychosocial evaluation.  He is a 80 year old who has COPD.  Mr. Iyengar has a strong support system with a spouse of 48 years and a daughter who lives close by.  He is also very actively involved in his local church.   Mr. Popp has multiple health issues with a previous heart attack and back surgeries and arthritis  as well as neuropathy in his legs.  He states he sleeps well and has a good appetite.  He denies a history of depression or anxiety or any current symptoms.  Mr. Mcniel appears to be a well adjusted gentleman who has a very positive attitude in general.  He has goals to breathe better  and increase his stamina while in this program.  Counselor will continue to follow with him while in this program.        Psychosocial Re-Evaluation:  Education: Education Goals: Education classes will be provided on a weekly basis, covering required topics. Participant will state understanding/return demonstration of topics presented.  Learning Barriers/Preferences:     Learning Barriers/Preferences - 11/11/15 1236      Learning Barriers/Preferences   Learning Barriers None   Learning Preferences Group Instruction;Individual Instruction;Pictoral;Skilled Demonstration;Verbal Instruction;Video;Written Material      Education Topics: Initial Evaluation Education: - Verbal, written and demonstration of respiratory meds, RPE/PD scales, oximetry and breathing techniques. Instruction on use of nebulizers and MDIs: cleaning and proper use, rinsing mouth with steroid doses and importance of monitoring MDI activations. Flowsheet Row Pulmonary Rehab from 11/19/2015 in [facility] Cardiac and Pulmonary Rehab  Date  11/11/15  Educator  LB  Instruction Review Code  2- meets goals/outcomes      General Nutrition Guidelines/Fats and Fiber: -Group instruction provided by verbal, written material, models and posters to present the general guidelines for heart healthy nutrition. Gives an explanation and review of dietary fats and fiber.   Controlling Sodium/Reading Food Labels: -Group verbal and written material supporting the discussion of sodium use in heart healthy nutrition. Review and explanation with models, verbal and written materials for utilization of the food label.   Exercise Physiology & Risk Factors: - Group  verbal and written instruction with models to review the exercise physiology of the cardiovascular system and associated critical values. Details cardiovascular disease risk factors and the goals associated with each risk factor.   Aerobic Exercise & Resistance Training: - Gives group verbal and written discussion on the health impact of inactivity. On the components of aerobic and resistive training programs and the benefits of this training and how to safely progress through these programs.   Flexibility, Balance, General Exercise Guidelines: - Provides group verbal and written instruction on the benefits of flexibility and balance training programs. Provides general exercise guidelines with specific guidelines to those with heart or lung disease. Demonstration and skill practice provided.   Stress Management: - Provides group verbal and written instruction about the health risks of elevated stress, cause of high stress, and healthy ways to reduce stress.   Depression: - Provides group verbal and written instruction on the correlation between heart/lung disease and depressed mood, treatment options, and the stigmas associated with seeking treatment.   Exercise & Equipment Safety: - Individual verbal instruction and demonstration of equipment use and safety with use of the equipment.   Infection Prevention: - Provides verbal and written material to individual with discussion of infection control including proper hand washing and proper equipment cleaning during exercise session. Flowsheet Row Pulmonary Rehab from 11/19/2015 in [facility] Cardiac and Pulmonary Rehab  Date  11/11/15  Educator  LB  Instruction Review Code  2- meets goals/outcomes      Falls Prevention: - Provides verbal and written material to individual with discussion of falls prevention and safety. Flowsheet Row Pulmonary Rehab from 11/19/2015 in [facility] Cardiac and Pulmonary Rehab  Date  11/11/15  Educator  LB  Instruction  Review Code  2- meets goals/outcomes      Diabetes: - Individual verbal and written instruction to review signs/symptoms of diabetes, desired ranges of glucose level fasting, after meals and with exercise. Advice that pre and post exercise glucose checks will be done for 3 sessions at entry of program.   Chronic Lung Diseases: -  Group verbal and written instruction to review new updates, new respiratory medications, new advancements in procedures and treatments. Provide informative websites and "800" numbers of self-education.   Lung Procedures: - Group verbal and written instruction to describe testing methods done to diagnose lung disease. Review the outcome of test results. Describe the treatment choices: Pulmonary Function Tests, ABGs and oximetry.   Energy Conservation: - Provide group verbal and written instruction for methods to conserve energy, plan and organize activities. Instruct on pacing techniques, use of adaptive equipment and posture/positioning to relieve shortness of breath.   Triggers: - Group verbal and written instruction to review types of environmental controls: home humidity, furnaces, filters, dust mite/pet prevention, HEPA vacuums. To discuss weather changes, air quality and the benefits of nasal washing.   Exacerbations: - Group verbal and written instruction to provide: warning signs, infection symptoms, calling MD promptly, preventive modes, and value of vaccinations. Review: effective airway clearance, coughing and/or vibration techniques. Create an Sports administrator.   Oxygen: - Individual and group verbal and written instruction on oxygen therapy. Includes supplement oxygen, available portable oxygen systems, continuous and intermittent flow rates, oxygen safety, concentrators, and Medicare reimbursement for oxygen.   Respiratory Medications: - Group verbal and written instruction to review medications for lung disease. Drug class, frequency, complications,  importance of spacers, rinsing mouth after steroid MDI's, and proper cleaning methods for nebulizers. Flowsheet Row Pulmonary Rehab from 11/19/2015 in [facility] Cardiac and Pulmonary Rehab  Date  11/11/15  Educator  LB  Instruction Review Code  2- meets goals/outcomes      AED/CPR: - Group verbal and written instruction with the use of models to demonstrate the basic use of the AED with the basic ABC's of resuscitation.   Breathing Retraining: - Provides individuals verbal and written instruction on purpose, frequency, and proper technique of diaphragmatic breathing and pursed-lipped breathing. Applies individual practice skills. Flowsheet Row Pulmonary Rehab from 11/19/2015 in [facility] Cardiac and Pulmonary Rehab  Date  11/19/15  Educator  [facility]  Instruction Review Code  2- meets goals/outcomes      Anatomy and Physiology of the Lungs: - Group verbal and written instruction with the use of models to provide basic lung anatomy and physiology related to function, structure and complications of lung disease.   Heart Failure: - Group verbal and written instruction on the basics of heart failure: signs/symptoms, treatments, explanation of ejection fraction, enlarged heart and cardiomyopathy.   Sleep Apnea: - Individual verbal and written instruction to review Obstructive Sleep Apnea. Review of risk factors, methods for diagnosing and types of masks and machines for OSA.   Anxiety: - Provides group, verbal and written instruction on the correlation between heart/lung disease and anxiety, treatment options, and management of anxiety.   Relaxation: - Provides group, verbal and written instruction about the benefits of relaxation for patients with heart/lung disease. Also provides patients with examples of relaxation techniques.   Knowledge Questionnaire Score:     Knowledge Questionnaire Score - 11/11/15 1236      Knowledge Questionnaire Score   Pre Score 8/10       Core Components/Risk  Factors/Patient Goals at Admission:     Personal Goals and Risk Factors at Admission - 11/11/15 1246      Core Components/Risk Factors/Patient Goals on Admission   Sedentary Yes  Mr Umholtz does play golf - riding cart. He has a Personal assistant at [employer] and home equipment: treadmill and stationary bike.   Intervention Provide advice, education, support and  counseling about physical activity/exercise needs.;Develop an individualized exercise prescription for aerobic and resistive training based on initial evaluation findings, risk stratification, comorbidities and participant's personal goals.   Expected Outcomes Achievement of increased cardiorespiratory fitness and enhanced flexibility, muscular endurance and strength shown through measurements of functional capacity and personal statement of participant.   Increase Strength and Stamina Yes   Intervention Provide advice, education, support and counseling about physical activity/exercise needs.;Develop an individualized exercise prescription for aerobic and resistive training based on initial evaluation findings, risk stratification, comorbidities and participant's personal goals.   Expected Outcomes Achievement of increased cardiorespiratory fitness and enhanced flexibility, muscular endurance and strength shown through measurements of functional capacity and personal statement of participant.   Improve shortness of breath with ADL's Yes   Intervention Provide education, individualized exercise plan and daily activity instruction to help decrease symptoms of SOB with activities of daily living.   Expected Outcomes Short Term: Achieves a reduction of symptoms when performing activities of daily living.   Develop more efficient breathing techniques such as purse lipped breathing and diaphragmatic breathing; and practicing self-pacing with activity Yes   Intervention Provide education, demonstration and support about specific breathing  techniuqes utilized for more efficient breathing. Include techniques such as pursed lipped breathing, diaphragmatic breathing and self-pacing activity.   Expected Outcomes Short Term: Participant will be able to demonstrate and use breathing techniques as needed throughout daily activities.   Increase knowledge of respiratory medications and ability to use respiratory devices properly  Yes  Albuterol MDI; spacer given; SVN Albuterol; Oxygen at night 2l/m   Intervention Provide education and demonstration as needed of appropriate use of medications, inhalers, and oxygen therapy.   Expected Outcomes Short Term: Achieves understanding of medications use. Understands that oxygen is a medication prescribed by physician. Demonstrates appropriate use of inhaler and oxygen therapy.   Heart Failure Yes   Intervention Provide a combined exercise and nutrition program that is supplemented with education, support and counseling about heart failure. Directed toward relieving symptoms such as shortness of breath, decreased exercise tolerance, and extremity edema.   Expected Outcomes Improve functional capacity of life;Short term: Attendance in program 2-3 days a week with increased exercise capacity. Reported lower sodium intake. Reported increased fruit and vegetable intake. Reports medication compliance.;Short term: Daily weights obtained and reported for increase. Utilizing diuretic protocols set by physician.;Long term: Adoption of self-care skills and reduction of barriers for early signs and symptoms recognition and intervention leading to self-care maintenance.      Core Components/Risk Factors/Patient Goals Review:      Goals and Risk Factor Review    Row Name 11/19/15 1139 11/25/15 0658           Core Components/Risk Factors/Patient Goals Review   Personal Goals Review Develop more efficient breathing techniques such as purse lipped breathing and diaphragmatic breathing and practicing self-pacing  with activity. Develop more efficient breathing techniques such as purse lipped breathing and diaphragmatic breathing and practicing self-pacing with activity.;Improve shortness of breath with ADL's      Review Reviewed pursed lip breathing technique with Antony Haste today.  He has a tendency to drop his oxygen saturation walking and talking.   Mr Vandeberg has had several O2Sat readings in the 86-89% range. He has a home finger oximeter and monitors these O2Sat with activites at home and when playing golf. He does not want to wear oxygen portably, and so he paces himself. He stops the activity until his O2Sat returns to the 90's. Education was  done on the importance of O2Sat's in the 90's for lung and heart health.      Expected Outcomes Pt will continue to come to classes to improved techniques for shortness of breath. Continue to monitor O2Sat's with exercise goals and encourage PLB and possible oxygen use.         Core Components/Risk Factors/Patient Goals at Discharge (Final Review):      Goals and Risk Factor Review - 11/25/15 0658      Core Components/Risk Factors/Patient Goals Review   Personal Goals Review Develop more efficient breathing techniques such as purse lipped breathing and diaphragmatic breathing and practicing self-pacing with activity.;Improve shortness of breath with ADL's   Review Mr Lecomte has had several O2Sat readings in the 86-89% range. He has a home finger oximeter and monitors these O2Sat with activites at home and when playing golf. He does not want to wear oxygen portably, and so he paces himself. He stops the activity until his O2Sat returns to the 90's. Education was done on the importance of O2Sat's in the 90's for lung and heart health.   Expected Outcomes Continue to monitor O2Sat's with exercise goals and encourage PLB and possible oxygen use.      ITP Comments:     ITP Comments    Row Name 11/26/15 1401           ITP Comments Cheral Bay decided to graduate today due to  his insurance copay.  He plans to continue to exercise by going to [facility].          Comments: Discharge ITP

## 2016-03-04 ENCOUNTER — Encounter (HOSPITAL_COMMUNITY): Payer: Medicare Other

## 2016-03-17 ENCOUNTER — Ambulatory Visit
Admission: RE | Admit: 2016-03-17 | Discharge: 2016-03-17 | Disposition: A | Discharge status: Home / Self Care | Payer: Medicare Other | Source: Ambulatory Visit | Attending: Internal Medicine | Admitting: Internal Medicine

## 2016-03-17 ENCOUNTER — Other Ambulatory Visit: Payer: Self-pay | Admitting: Internal Medicine

## 2016-03-17 DIAGNOSIS — R911 Solitary pulmonary nodule: Secondary | ICD-10-CM

## 2016-03-17 DIAGNOSIS — R0781 Pleurodynia: Secondary | ICD-10-CM

## 2016-04-05 ENCOUNTER — Ambulatory Visit
Admission: RE | Admit: 2016-04-05 | Discharge: 2016-04-05 | Disposition: A | Discharge status: Home / Self Care | Payer: Medicare Other | Source: Ambulatory Visit | Attending: Internal Medicine | Admitting: Internal Medicine

## 2016-04-05 DIAGNOSIS — R911 Solitary pulmonary nodule: Secondary | ICD-10-CM

## 2016-04-05 DIAGNOSIS — C3492 Malignant neoplasm of unspecified part of left bronchus or lung: Secondary | ICD-10-CM | POA: Insufficient documentation

## 2016-04-05 MED ORDER — IOPAMIDOL (ISOVUE-300) INJECTION 61%: 75.0000 mL | Freq: Once | INTRAVENOUS | Status: DC | PRN

## 2016-04-05 NOTE — Progress Notes (Signed)
Patient had an allergic reaction to IV contrast post CT scan today. He was administered 75 ml Isovue 300. He has had IV contrast in the past with no allergic reactions.   He experienced tongue tingling followed by sublingual swelling and throat swelling, redness and irritation, along with redness and irritation of both eyes.  Patient was evaluated by Dr. Joneen Caraway and given 50 mg Diphenhydramine and he drank 4 cups of water. Patient was monitored for one hour after and checked on several times.    After 1 hour and 15 minutes, sublingual redness had subsided and swelling had gone down, eye redness had decreased.  Patient still had slight swelling under tongue but stated he felt better. He did not complain of any breathing difficulty.  Dr. Joneen Caraway re evaluated patient and agreed that he could safely return home.  Patient was accompanied by his wife, who agreed to drive him home and monitor him at home for any further problems. She was given a wallet card with patient's allergic reaction to contrast so he could keep it for emergencies.  Patient's allergies were updated in Epic.

## 2016-04-14 ENCOUNTER — Institutional Professional Consult (permissible substitution) (INDEPENDENT_AMBULATORY_CARE_PROVIDER_SITE_OTHER): Payer: Medicare Other | Admitting: Thoracic Surgery (Cardiothoracic Vascular Surgery)

## 2016-04-14 NOTE — Progress Notes (Unsigned)
This encounter was created in error - please disregard.

## 2016-04-15 ENCOUNTER — Encounter: Payer: Self-pay | Admitting: Thoracic Surgery (Cardiothoracic Vascular Surgery)

## 2016-04-15 ENCOUNTER — Institutional Professional Consult (permissible substitution) (INDEPENDENT_AMBULATORY_CARE_PROVIDER_SITE_OTHER): Payer: Medicare Other | Admitting: Thoracic Surgery (Cardiothoracic Vascular Surgery)

## 2016-04-15 ENCOUNTER — Other Ambulatory Visit: Payer: Self-pay | Admitting: *Deleted

## 2016-04-15 ENCOUNTER — Encounter: Payer: Self-pay | Admitting: *Deleted

## 2016-04-15 VITALS — BP 119/80 | HR 80 | Resp 20 | Ht 71.0 in | Wt 208.0 lb

## 2016-04-15 DIAGNOSIS — R918 Other nonspecific abnormal finding of lung field: Secondary | ICD-10-CM

## 2016-04-15 NOTE — Progress Notes (Signed)
PCP is Omar Low, MD Referring Provider is Omar Low, MD  Chief Complaint  Patient presents with  . Lung Mass    Surgical eval, Chest CT 04/05/2016    HPI: 81 yo man with a past history of tobacco abuse sent for consultation regarding lung nodules.  [name] is a 81 year old man with a past history of tobacco abuse (100 pack years), COPD, coronary artery disease with 2 previous MIs and 2 previous stents, back pain, peripheral neuropathy, hypertension, hyperlipidemia, gastroesophageal reflux, hepatitis, glaucoma, cervical spine fracture in the past, small AAA, and chronic lymphocytic leukemia. Back in early January he noticed right-sided chest pain. He described this as a sharp stabbing pain that was related to position. Try to sit in a reclined position or lying on his right side he would have severe pain that would continue to worsen until he changes position. Saw Omar. Lysle [name]. Rib films were done. It showed a new indistinct nodule of the anterior right lung base and possibly a small right pleural effusion. A CT of the chest was done. It showed a 1.2 x 0.9 x 1.2 cm left upper lobe nodule, right middle lobe parenchymal changes spanning 1.7 cm, and a small right lower lobe nodule posteriorly adjacent to the spine measured 7 x 10 mm. There was no mediastinal or hilar adenopathy. He was also noted to have bilateral renal cysts, aortic atherosclerosis, coronary artery calcification, and degenerative thoracic spine changes.  [name]. He gets short of breath with exertion. He has been participating in pulmonary rehabilitation and now is exercising at home on a treadmill. His most recent 6 minute walk test was over 1100 feet. He says his exercise tolerance. She day today. He has not been having any anginal-type chest pain. He continues to have positional right-sided chest pain. He's been having some mild headaches mostly feeling of congestion. He denies any hemoptysis. He uses  home oxygen at night. He does not needed during the day even with exercise. His appetite is good. His weight has been stable.  Zubrod Score: At the time of surgery this patient's most appropriate activity status/level should be described as: '[]'$     0    Normal activity, no symptoms '[x]'$     1    Restricted in physical strenuous activity but ambulatory, able to do out light work '[]'$     2    Ambulatory and capable of self care, unable to do work activities, up and about >50 % of waking hours                              '[]'$     3    Only limited self care, in bed greater than 50% of waking hours '[]'$     4    Completely disabled, no self care, confined to bed or chair '[]'$     5    Moribund    Past Medical History:  Diagnosis Date  . AAA (abdominal aortic aneurysm) (HCC)    measures 2.7 cm.  . Back pain   . BPH (benign prostatic hypertrophy)   . Bruises easily   . Cervical spine fracture ([place]) feb 1990   C 6, C 7 and T 1, no surgery done wore halo brace  . CLL (chronic lymphocytic leukemia) ([place])    hx of worked up 2009 by [name], no tx done, released by [name]  . Complication of anesthesia  cannot lie on right side gets vertigo and trouble turning neck to right  . COPD (chronic obstructive pulmonary disease) ([place]) may 2005  . Erectile dysfunction   . Fatigue   . GERD (gastroesophageal reflux disease)   . Glaucoma   . Hepatitis as child   serum hepatitis  . History of oxygen administration    oxygen  '@2'$   l/m nasally at bedtime.  . Hyperlipidemia   . Hypertension   . Impaired hearing    wears Hearing aids  . Leg pain   . Lumbar disc disease   . Microscopic hematuria   . Myocardial infarct 03-17-2009   Hx MI 2000  . Neuropathy (HCC)    both feet and legs  . Osteopenia     Past Surgical History:  Procedure Laterality Date  . APPENDECTOMY  sept, 10, 2005  . BACK SURGERY  2009   L 2 and L 3   . benign cyst removed from left neck  02-2010  . CERVICAL DISC SURGERY   2005-[name]   C 2, C 3 and C 4 2005, rods placed C 6, C 7 and T 1  . COLONOSCOPY WITH PROPOFOL N/A 11/26/2014   Procedure: COLONOSCOPY WITH PROPOFOL;  Surgeon: Omar Fair, MD;  Location: [facility];  Service: Endoscopy;  Laterality: N/A;  . CORONARY ANGIOPLASTY WITH STENT PLACEMENT  2013   1 stent repair, one stent replaced  . ESOPHAGOGASTRODUODENOSCOPY (EGD) WITH PROPOFOL N/A 11/27/2013   Procedure: ESOPHAGOGASTRODUODENOSCOPY (EGD) WITH PROPOFOL;  Surgeon: Omar Fair, MD;  Location: [facility];  Service: Endoscopy;  Laterality: N/A;  . EYE SURGERY Bilateral july 2011   eye surgry for glaucoma  . fusion L 3 and L 4  August 15, 2008  . LEFT HEART CATHETERIZATION WITH CORONARY ANGIOGRAM N/A 08/12/2011   Procedure: LEFT HEART CATHETERIZATION WITH CORONARY ANGIOGRAM;  Surgeon: Larey Dresser, MD;  Location: [facility] CATH LAB;  Service: Cardiovascular;  Laterality: N/A;  . NASAL SINUS SURGERY   oct 2005  . right knee arthroscopy  1981  . stent top heart  2011   x 2  . TONSILLECTOMY  1943    Family History  Problem Relation Age of Onset  . Lung cancer Father 65    died.   . Alzheimer's disease Mother     died of natural causes    Social History Social History  Substance Use Topics  . Smoking status: Former Smoker    Packs/day: 2.00    Years: 55.00    Types: Cigarettes    Quit date: 5/1/[name]  . Smokeless tobacco: Never Used  . Alcohol use No    Current Outpatient Prescriptions  Medication Sig Dispense Refill  . albuterol (PROAIR HFA) 108 (90 BASE) MCG/ACT inhaler Inhale 2 puffs into the lungs every 6 (six) hours as needed. For shortness of breath    . albuterol (PROVENTIL) (2.5 MG/3ML) 0.083% nebulizer solution Take 2.5 mg by nebulization every 6 (six) hours as needed for wheezing or shortness of breath.    [name] alendronate (FOSAMAX) 70 MG tablet Take 70 mg by mouth once a week. Take with a full glass of water on an empty stomach.    [name] aspirin 81 MG tablet Take 81 mg by mouth  daily.     [name] CALCIUM-MAGNESIUM-VITAMIN D PO Take 1 tablet by mouth 2 (two) times daily.    . dorzolamide-timolol (COSOPT) 22.3-6.8 MG/ML ophthalmic solution Place 1 drop into both eyes 2 (two) times daily.    [name] gabapentin (  NEURONTIN) 300 MG capsule Take 600 mg by mouth 2 (two) times daily.     [name] latanoprost (XALATAN) 0.005 % ophthalmic solution Place 1 drop into both eyes at bedtime.     . metoprolol succinate (TOPROL-XL) 25 MG 24 hr tablet Take 25 mg by mouth every morning.     . nitroGLYCERIN (NITROSTAT) 0.4 MG SL tablet Place 0.4 mg under the tongue every 5 (five) minutes as needed. For chest pain    . rosuvastatin (CRESTOR) 20 MG tablet Take 20 mg by mouth daily.    . Tamsulosin HCl (FLOMAX) 0.4 MG CAPS Take 0.4 mg by mouth every evening.      No current facility-administered medications for this visit.     Allergies  Allergen Reactions  . Entresto [Sacubitril-Valsartan] Other (See Comments)    Patient had diarrhea, hyptotensive,dizziness,headache and lethargic Patient had diarrhea, hyptotensive,dizziness,headache and lethargic  . Iodinated Diagnostic Agents Swelling and Other (See Comments)    Patient experienced tongue tingling, followed by swelling under tongue and in throat area, also patient had eye redness/irriation  . Bacitracin     Itching and swelling  . Brimonidine Tartrate     Unknown  . Ciprofloxacin     Patient does not want to have this anymore(does not want any forms of this)  . Flonase [Fluticasone Propionate]     Nose bleed  . Lumigan [Bimatoprost]     Has sulfa in it, eyes swell  . Lyrica [Pregabalin]     Tremors and confusion  . Nabumetone     REACTION: Reaction not known to relafen  . Penicillins     REACTION: swelling of tongue  . Pulmicort [Budesonide]     Itching or swelling  . Qvar [Beclomethasone]     Itching or swelling  . Spiriva [Tiotropium Bromide Monohydrate]     Itching or swelling  . Sulfonamide Derivatives     REACTION: swelling itching  and hands  . Symbicort [Budesonide-Formoterol Fumarate]     Itching or swellng  . Valsartan Other (See Comments)    BP drop  . Adhesive [Tape] Rash and Other (See Comments)    Skin tear, please use paper tape    Review of Systems  Constitutional: Positive for fatigue. Negative for activity change, chills, fever and unexpected weight change.  HENT: Positive for hearing loss. Negative for trouble swallowing.   Eyes: Positive for visual disturbance (catarracts).  Respiratory: Positive for cough and shortness of breath.        Home O2  Cardiovascular: Positive for leg swelling (mild - varicose veins). Negative for chest pain.  Gastrointestinal: Negative for abdominal pain and blood in stool.  Genitourinary: Negative for difficulty urinating and dysuria.  Musculoskeletal: Positive for arthralgias, back pain and joint swelling.  Neurological: Positive for headaches (tension). Negative for syncope and weakness.  Hematological: Negative for adenopathy. Bruises/bleeds easily.  All other systems reviewed and are negative.   BP 119/80   Pulse 80   Resp 20   Ht '5\' 11"'$  (1.803 m)   Wt 208 lb (94.3 kg)   SpO2 92%   BMI 29.01 kg/m  Physical Exam  Constitutional: He is oriented to person, place, and time. He appears well-developed and well-nourished. No distress.  HENT:  Head: Normocephalic and atraumatic.  Mouth/Throat: No oropharyngeal exudate.  Eyes: Conjunctivae and EOM are normal. No scleral icterus.  Neck: Neck supple. No thyromegaly present.  No bruit  Cardiovascular: Normal rate, regular rhythm and normal heart sounds.  Exam reveals no gallop  and no friction rub.   No murmur heard. Pulmonary/Chest: Effort normal. No respiratory distress. He has no wheezes.  Abdominal: Soft. He exhibits no distension. There is no tenderness.  Musculoskeletal: Normal range of motion. He exhibits no edema.  Lymphadenopathy:    He has no cervical adenopathy.  Neurological: He is alert and oriented  to person, place, and time. No cranial nerve deficit. He exhibits normal muscle tone.  Skin: Skin is warm.  Psychiatric: He has a normal mood and affect.  Vitals reviewed.    Diagnostic Tests: Limited PFTs 11/11/15 FEV1= 46% FEV1 = 50% postbronchodilator FVC= 75% postbronchodilator  CT CHEST WITH CONTRAST  TECHNIQUE: Multidetector CT imaging of the chest was performed during intravenous contrast administration.  CONTRAST:  75 cc Isovue 300.  COMPARISON:  03/17/2016 right rib series including chest x-ray. 09/09/2015 and 03/16/2012 chest x-ray. 03/12/2009 chest CT.  FINDINGS: Cardiovascular: Heart size top-normal. Prominent coronary artery calcifications. Trace pericardial fluid. No obvious central pulmonary embolus.  Atherosclerotic changes thoracic aorta with mild ectasia. Ascending thoracic aorta measures up to 3.5 cm without significant change.  Mediastinum/Nodes: No pathologically enlarged lymph nodes. Increased number of normal size lymph nodes most notable subcarinal region without significant change. The slightly rounded lymph node upper right paratracheal region is stable.  Lungs/Pleura: Left upper lobe 1.2 x 0.9 x 1.2 cm nodule/mass (series 4 image 22). Given the patient's history of smoking, this is suspicious for malignancy.  There are regions of scarring throughout the lung bilaterally some noted on the prior CT scan. Within the inferolateral aspect of the right middle lobe, are parenchymal changes which have progressed when can compared to prior CT now spanning over 1.7 cm (series 602, image 36). New pleural based 1 x 0.7 cm nodule inferior medial right lower lobe (series 4, image 97).  Upper Abdomen: Bilateral renal cysts larger on the left measuring up to 4.8 cm. No adrenal gland enlargement. 4 mm [name]-density structure dome of the liver of indeterminate etiology.  Musculoskeletal: Mild T1 superior endplate compression fracture with anterior  wedging similar to prior exam. Degenerative changes throughout the thoracic spine. No osseous destructive lesion.  Gynecomastia.  IMPRESSION: Left upper lobe 1.2 x 0.9 x 1.2 cm nodule/mass (series 4 image 22). Given the patient's history of smoking, this is suspicious for malignancy. Thoracic surgeon consultation and PET-CT recommended for further delineation.  There are regions of scarring throughout the lung bilaterally some noted on the prior CT scan. Within the inferolateral aspect of the right middle lobe, are parenchymal changes which have progressed when can compared to prior CT now spanning over 1.7 cm (series 602, image 36). New pleural based 1 x 0.7 cm nodule inferior medial right lower lobe (series 4, image 97). Attention to these regions on PET-CT.  Increased number of normal size mediastinal lymph nodes without significant change from prior CT.  No adrenal gland enlargement.  4 mm [name]-density structure dome of the liver of indeterminate etiology.  No osseous destructive lesion. Degenerative changes thoracic spine with remote anterior wedge compression T1 unchanged.  Coronary artery calcification.  Atherosclerotic changes thoracic aorta with mild ectasia.  These results will be called to the ordering clinician or representative by the Radiologist Assistant, and communication documented in the PACS or zVision Dashboard.   Electronically Signed   By: Genia Del M.D.   On: 04/05/2016 11:48  I personally reviewed the CT images and concur with findings noted above Impression: [name] is a 81 year old man with a history of tobacco  abuse and COPD who presented with right-sided chest wall pain. Workup included rib films which showed suspicious findings and then a CT of the chest which showed a highly suspicious 1.2 cm left upper lobe nodule, a small subpleural nodule in the right lower lobe, and parenchymal changes in the right middle lobe. I suspect  changes are infectious or inflammatory in nature and are the cause of his right-sided chest pain. There is no rib pathology. I am very concerned that the left upper lobe nodule is a new primary bronchogenic carcinoma, and has to be considered that unless it can be proven otherwise.  I recommended a PET/CT for clinical staging to guide initial diagnostic workup and therapy. He was concerned about his allergy to IV contrast, but I explained to him that this did not involve iodinated contrast. We will schedule that.  He does have some significant COPD and uses home O2 at night. He looks better to the eye than his history sounds and I think he may be a surgical candidate. Particularly encouraging as his 6 minute walk test. He needs more formal pulmonary function testing including diffusion capacity.  He has a history of coronary disease as his previous stents. He saw Omar. Adrian Prows recently and had an echocardiogram done. I will obtain those results and also ask Omar. Einar Gip his feelings about Mr. Kiedrowski's cardiac status.  I had a brief discussion with Mr. and Mrs. Tulloch regarding treatment options, including surgery and stereotactic radiation. We discussed the relative advantages and disadvantages of each of those approaches. We did briefly discuss the potential risks of surgery including death, MI, DVT, PE, bleeding, infection. We will discuss these issues further once we have the results of our additional testing.  Plan: Obtain echo and carotid duplex results from Omar. Einar Gip  Pulmonary function testing with and without bronchodilators.  PET/CT.  I will see him back in 2 weeks to discuss the results of these tests and plan further workup.  Melrose Nakayama, MD [facility] [phone]

## 2016-04-26 ENCOUNTER — Ambulatory Visit (HOSPITAL_COMMUNITY)
Admission: RE | Admit: 2016-04-26 | Discharge: 2016-04-26 | Disposition: A | Discharge status: Home / Self Care | Payer: Medicare Other | Source: Ambulatory Visit | Attending: Thoracic Surgery (Cardiothoracic Vascular Surgery) | Admitting: Thoracic Surgery (Cardiothoracic Vascular Surgery)

## 2016-04-26 DIAGNOSIS — R918 Other nonspecific abnormal finding of lung field: Secondary | ICD-10-CM

## 2016-04-26 DIAGNOSIS — E041 Nontoxic single thyroid nodule: Secondary | ICD-10-CM | POA: Diagnosis not present

## 2016-04-26 DIAGNOSIS — J439 Emphysema, unspecified: Secondary | ICD-10-CM | POA: Insufficient documentation

## 2016-04-26 DIAGNOSIS — I2510 Atherosclerotic heart disease of native coronary artery without angina pectoris: Secondary | ICD-10-CM | POA: Insufficient documentation

## 2016-04-26 DIAGNOSIS — I714 Abdominal aortic aneurysm, without rupture: Secondary | ICD-10-CM | POA: Insufficient documentation

## 2016-04-26 DIAGNOSIS — I700 Atherosclerosis of aorta: Secondary | ICD-10-CM | POA: Insufficient documentation

## 2016-04-26 DIAGNOSIS — R911 Solitary pulmonary nodule: Secondary | ICD-10-CM | POA: Insufficient documentation

## 2016-04-26 DIAGNOSIS — N281 Cyst of kidney, acquired: Secondary | ICD-10-CM | POA: Diagnosis not present

## 2016-04-26 LAB — PULMONARY FUNCTION TEST
DL/VA % pred: 49 %
DL/VA: 2.28 ml/min/mmHg/L
DLCO unc % pred: 30 %
DLCO unc: 9.82 ml/min/mmHg
FEF 25-75 Post: 0.52 L/sec
FEF 25-75 Pre: 0.32 L/sec
FEF2575-%Change-Post: 61 %
FEF2575-%Pred-Post: 24 %
FEF2575-%Pred-Pre: 15 %
FEV1-%Change-Post: 22 %
FEV1-%Pred-Post: 47 %
FEV1-%Pred-Pre: 38 %
FEV1-Post: 1.39 L
FEV1-Pre: 1.14 L
FEV1FVC-%Change-Post: 3 %
FEV1FVC-%Pred-Pre: 63 %
FEV6-%Change-Post: 14 %
FEV6-%Pred-Post: 62 %
FEV6-%Pred-Pre: 54 %
FEV6-Post: 2.39 L
FEV6-Pre: 2.09 L
FEV6FVC-%Change-Post: -3 %
FEV6FVC-%Pred-Post: 87 %
FEV6FVC-%Pred-Pre: 90 %
FVC-%Change-Post: 18 %
FVC-%Pred-Post: 71 %
FVC-%Pred-Pre: 60 %
FVC-Post: 2.94 L
FVC-Pre: 2.48 L
Post FEV1/FVC ratio: 47 %
Post FEV6/FVC ratio: 81 %
Pre FEV1/FVC ratio: 46 %
Pre FEV6/FVC Ratio: 84 %
RV % pred: 119 %
RV: 3.13 L
TLC % pred: 82 %
TLC: 5.83 L

## 2016-04-26 LAB — GLUCOSE, CAPILLARY: Glucose-Capillary: 109 mg/dL — ABNORMAL HIGH (ref 65–99)

## 2016-04-26 MED ORDER — ALBUTEROL SULFATE (2.5 MG/3ML) 0.083% IN NEBU
2.5000 mg | INHALATION_SOLUTION | Freq: Once | RESPIRATORY_TRACT | Status: AC
  Administered 2016-04-26: 2.5 mg via RESPIRATORY_TRACT

## 2016-04-26 MED ORDER — FLUDEOXYGLUCOSE F - 18 (FDG) INJECTION: 10.3000 | Freq: Once | INTRAVENOUS | Status: DC | PRN

## 2016-05-04 ENCOUNTER — Ambulatory Visit (INDEPENDENT_AMBULATORY_CARE_PROVIDER_SITE_OTHER): Payer: Medicare Other | Admitting: Thoracic Surgery (Cardiothoracic Vascular Surgery)

## 2016-05-04 ENCOUNTER — Encounter: Payer: Self-pay | Admitting: Thoracic Surgery (Cardiothoracic Vascular Surgery)

## 2016-05-04 ENCOUNTER — Other Ambulatory Visit: Payer: Self-pay | Admitting: *Deleted

## 2016-05-04 VITALS — BP 134/88 | HR 80 | Resp 20 | Ht 71.0 in | Wt 208.0 lb

## 2016-05-04 DIAGNOSIS — R918 Other nonspecific abnormal finding of lung field: Secondary | ICD-10-CM | POA: Diagnosis not present

## 2016-05-04 DIAGNOSIS — R911 Solitary pulmonary nodule: Secondary | ICD-10-CM

## 2016-05-04 NOTE — Progress Notes (Signed)
[address]       [place] [address]             [phone]    HPI: Mr. Conger returns today to discuss results of his pulmonary function testing and PET/CT.  He is a 81 year old male with a past history of tobacco abuse (100 pack years), COPD, coronary disease with 2 previous MIs and 2 previous stents. He also has a history of back pain, peripheral neuropathy, hypertension, hyperlipidemia, cervical spine fracture, and chronic lymphocytic leukemia. He presented in January with right-sided chest pain. Rib films showed an interesting nodule in the anterior right lung base. A CT of the chest was done. The questionable lung nodule was parenchymal changes in the right middle lobe. He did however have a 1.2 cm left upper lobe spiculated nodule that was highly suspicious. There was no mediastinal or hilar adenopathy.  Mr. Keating quit smoking in 2008. He uses home oxygen at night, but not during the day even with exercise. He did a 6 minute walk test of 1100 feet in pulmonary rehabilitation.   Zubrod Score: At the time of surgery this patient's most appropriate activity status/level should be described as: '[]'$     0    Normal activity, no symptoms '[x]'$     1    Restricted in physical strenuous activity but ambulatory, able to do out light work '[]'$     2    Ambulatory and capable of self care, unable to do work activities, up and about >50 % of waking hours                              '[]'$     3    Only limited self care, in bed greater than 50% of waking hours '[]'$     4    Completely disabled, no self care, confined to bed or chair '[]'$     5    Moribund   Current Outpatient Prescriptions  Medication Sig Dispense Refill  . albuterol (PROAIR HFA) 108 (90 BASE) MCG/ACT inhaler Inhale 2 puffs into the lungs every 6 (six) hours as needed. For shortness of breath    . albuterol (PROVENTIL) (2.5 MG/3ML) 0.083% nebulizer solution Take 2.5 mg by nebulization every 6 (six) hours as needed for  wheezing or shortness of breath.    Marland Kitchen alendronate (FOSAMAX) 70 MG tablet Take 70 mg by mouth once a week. Take with a full glass of water on an empty stomach.    Marland Kitchen aspirin 81 MG tablet Take 81 mg by mouth daily.     Marland Kitchen CALCIUM-MAGNESIUM-VITAMIN D PO Take 1 tablet by mouth 2 (two) times daily.    . dorzolamide-timolol (COSOPT) 22.3-6.8 MG/ML ophthalmic solution Place 1 drop into both eyes 2 (two) times daily.    Marland Kitchen gabapentin (NEURONTIN) 300 MG capsule Take 600 mg by mouth 2 (two) times daily.     Marland Kitchen latanoprost (XALATAN) 0.005 % ophthalmic solution Place 1 drop into both eyes at bedtime.     . metoprolol succinate (TOPROL-XL) 25 MG 24 hr tablet Take 25 mg by mouth every morning.     . nitroGLYCERIN (NITROSTAT) 0.4 MG SL tablet Place 0.4 mg under the tongue every 5 (five) minutes as needed. For chest pain    . rosuvastatin (CRESTOR) 20 MG tablet Take 20 mg by mouth daily.    . Tamsulosin HCl (FLOMAX) 0.4 MG CAPS Take 0.4 mg  by mouth every evening.      No current facility-administered medications for this visit.     Physical Exam BP 134/88   Pulse 80   Resp 20   Ht '5\' 11"'$  (1.803 m)   Wt 208 lb (94.3 kg)   SpO2 94% Comment: RA  BMI 29.48 kg/m  81 year old man in no acute distress Alert and oriented 3 with no focal deficits No cervical or supraclavicular adenopathy Diminished breath sounds bilaterally, no wheezing Cardiac regular rate and rhythm normal S1 and S2  Diagnostic Tests: NUCLEAR MEDICINE PET SKULL BASE TO THIGH  TECHNIQUE: 10.3 mCi F-18 FDG was injected intravenously. Full-ring PET imaging was performed from the skull base to thigh after the radiotracer. CT data was obtained and used for attenuation correction and anatomic localization.  FASTING BLOOD GLUCOSE:  Value: 109 mg/dl  COMPARISON:  Chest CT from 04/05/2016  FINDINGS: NECK  Hypermetabolic right thyroid nodule, maximum SUV 8.3. No adenopathy in the neck identified.  CHEST  The spiculated left  upper lobe nodule measures approximately 1.8 cm transverse and 1.3 cm anterior- posterior, with a maximum SUV of 11.9  Emphysema. Mild scarring at the lung bases. A 5 mm in parenchymal short axis left axillary lymph node on image 56/4 has a maximum SUV of 2.7, likely benign. Background mediastinal blood pool activity 4.2. I do not perceive any definite hypermetabolic thoracic adenopathy  Coronary, aortic arch, and branch vessel atherosclerotic vascular disease.  ABDOMEN/PELVIS  Photopenic cysts in both kidneys. No adrenal mass or pathologic abdominopelvic adenopathy.  Aortoiliac atherosclerotic vascular disease. Abdominal aortic aneurysm, 3.2 cm diameter. Postoperative findings in the lumbar spine. Postoperative findings in the cervical spine.  SKELETON  No focal hypermetabolic activity to suggest skeletal metastasis.  IMPRESSION: 1. The left upper lobe nodule has a maximum SUV of 11.9, suspicious for lung cancer. 2. Hypermetabolic right thyroid nodule, maximum SUV 8.3. A significant minority of hypermetabolic thyroid nodules turn out to be thyroid cancer and this warrants further investigation with thyroid sonography and likely fine-needle aspiration. 3. Abdominal aortic aneurysm, 3.2 cm in diameter. Recommend followup by ultrasound in 3 years. This recommendation follows ACR consensus guidelines: White Paper of the ACR Incidental Findings Committee II on Vascular Findings. J Am Coll Radiol 2013; 16:606-301 4. Other imaging findings of potential clinical significance: Emphysema. Scarring in the lung bases. Coronary, aortic arch, and branch vessel atherosclerotic vascular disease. Aortoiliac atherosclerotic vascular disease. Postoperative findings in the cervical and lumbar spine. Renal cysts.   Electronically Signed   By: Van Clines M.D.   On: 04/26/2016 16:11  I personally reviewed the PET/CT  Findings noted above. Of note he had a right thyroid  nodule aspirated 2016 which showed a benign follicular nodule.  Pulmonary function testing FVC 2.48 (60%) FEV1 1.14 (38%) FEV1/FVC 0.46 (63%) FEV1 postbronchodilator 1.39 (47%) DLCO 9.82 (30%)  Impression: Mr. Waymire is a 81 year old man with a history of heavy tobacco abuse who has a newly discovered 1.2 cm left upper lobe nodule that is highly suspicious for a new primary bronchogenic carcinoma. This nodule is markedly hypermetabolic on PET CT which further increases the degree of suspicion. Of note the subpleural nodule in the right lower lobe in the paravertebral area was not hypermetabolic. There was no evidence of regional or distant metastases.  I had a long discussion with Mr. and Mrs. Paradiso. We reviewed the PET images. Despite his good 6 minute walk test with pulmonary rehabilitation I am concerned about his ability to tolerate a  significant pulmonary resection given his DLCO of 30% of baseline. I think that rules out lobectomy and segmentectomy. I do think that we could get him through a wedge resection without adversely impacting his quality of life but there would be a higher risk of recurrence with a limited resection. The other option would be to treat him with stereotactic radiation. I suspect the results of with a wedge and stereotactic radiation would be fairly similar, especially when taking into account perioperative morbidity.  I advised him Mr. Housey to have a navigational bronchoscopy to biopsy the lesion and place fiducial markers for potential stereotactic radiation. If he socially changes mind and wishes to have wedge resection instead of stereotactic radiation that will not affect our ability to do so. He understands this is strictly diagnostic and not therapeutic. It would be done in the operating room under general anesthesia. He understands that is an endoscopic procedure. No guarantee can be made that we will make a definitive diagnosis. I reviewed the indications, risks,  benefits, and alternatives. He understands the risk include those associated with general anesthesia. Procedure specific risks include failure to make a diagnosis, pneumothorax, and bleeding. We will plan to do this on an outpatient basis.  He accepts the risks and agrees to proceed.   Plan:  Navigational bronchoscopy for biopsy and fiducial placement on Wednesday, 05/12/2016  Melrose Nakayama, MD [facility] [phone]

## 2016-05-07 ENCOUNTER — Encounter (HOSPITAL_COMMUNITY): Payer: Self-pay

## 2016-05-07 ENCOUNTER — Encounter (HOSPITAL_COMMUNITY)
Admission: RE | Admit: 2016-05-07 | Discharge: 2016-05-07 | Disposition: A | Discharge status: Home / Self Care | Payer: Medicare Other | Source: Ambulatory Visit | Attending: Thoracic Surgery (Cardiothoracic Vascular Surgery) | Admitting: Thoracic Surgery (Cardiothoracic Vascular Surgery)

## 2016-05-07 DIAGNOSIS — Z01818 Encounter for other preprocedural examination: Secondary | ICD-10-CM | POA: Insufficient documentation

## 2016-05-07 DIAGNOSIS — R911 Solitary pulmonary nodule: Secondary | ICD-10-CM | POA: Diagnosis not present

## 2016-05-07 DIAGNOSIS — Z01812 Encounter for preprocedural laboratory examination: Secondary | ICD-10-CM | POA: Insufficient documentation

## 2016-05-07 DIAGNOSIS — H409 Unspecified glaucoma: Secondary | ICD-10-CM | POA: Insufficient documentation

## 2016-05-07 DIAGNOSIS — Z955 Presence of coronary angioplasty implant and graft: Secondary | ICD-10-CM | POA: Diagnosis not present

## 2016-05-07 DIAGNOSIS — Z7982 Long term (current) use of aspirin: Secondary | ICD-10-CM | POA: Diagnosis not present

## 2016-05-07 DIAGNOSIS — C9110 Chronic lymphocytic leukemia of B-cell type not having achieved remission: Secondary | ICD-10-CM | POA: Diagnosis not present

## 2016-05-07 DIAGNOSIS — I2510 Atherosclerotic heart disease of native coronary artery without angina pectoris: Secondary | ICD-10-CM | POA: Diagnosis not present

## 2016-05-07 DIAGNOSIS — K219 Gastro-esophageal reflux disease without esophagitis: Secondary | ICD-10-CM | POA: Diagnosis not present

## 2016-05-07 DIAGNOSIS — J449 Chronic obstructive pulmonary disease, unspecified: Secondary | ICD-10-CM | POA: Insufficient documentation

## 2016-05-07 DIAGNOSIS — I10 Essential (primary) hypertension: Secondary | ICD-10-CM | POA: Insufficient documentation

## 2016-05-07 DIAGNOSIS — I255 Ischemic cardiomyopathy: Secondary | ICD-10-CM | POA: Insufficient documentation

## 2016-05-07 DIAGNOSIS — E785 Hyperlipidemia, unspecified: Secondary | ICD-10-CM | POA: Insufficient documentation

## 2016-05-07 DIAGNOSIS — I714 Abdominal aortic aneurysm, without rupture: Secondary | ICD-10-CM | POA: Insufficient documentation

## 2016-05-07 DIAGNOSIS — Z79899 Other long term (current) drug therapy: Secondary | ICD-10-CM | POA: Insufficient documentation

## 2016-05-07 DIAGNOSIS — Z87891 Personal history of nicotine dependence: Secondary | ICD-10-CM | POA: Diagnosis not present

## 2016-05-07 HISTORY — DX: Ischemic cardiomyopathy: I25.5

## 2016-05-07 HISTORY — DX: Nontoxic single thyroid nodule: E04.1

## 2016-05-07 HISTORY — DX: Unspecified osteoarthritis, unspecified site: M19.90

## 2016-05-07 HISTORY — DX: Pneumonia, unspecified organism: J18.9

## 2016-05-07 HISTORY — DX: Atherosclerotic heart disease of native coronary artery without angina pectoris: I25.10

## 2016-05-07 LAB — COMPREHENSIVE METABOLIC PANEL
ALT: 21 U/L (ref 17–63)
AST: 24 U/L (ref 15–41)
Albumin: 3.7 g/dL (ref 3.5–5.0)
Alkaline Phosphatase: 72 U/L (ref 38–126)
Anion gap: 7 (ref 5–15)
BUN: 13 mg/dL (ref 6–20)
CO2: 31 mmol/L (ref 22–32)
Calcium: 9.9 mg/dL (ref 8.9–10.3)
Chloride: 105 mmol/L (ref 101–111)
Creatinine, Ser: 1.13 mg/dL (ref 0.61–1.24)
GFR calc Af Amer: >60 mL/min (ref >60)
GFR calc non Af Amer: >60 mL/min (ref >60)
Glucose, Bld: 146 mg/dL — ABNORMAL HIGH (ref 65–99)
Potassium: 5 mmol/L (ref 3.5–5.1)
Sodium: 143 mmol/L (ref 135–145)
Total Bilirubin: 1.4 mg/dL — ABNORMAL HIGH (ref 0.3–1.2)
Total Protein: 7.1 g/dL (ref 6.5–8.1)

## 2016-05-07 LAB — CBC
HCT: 43 % (ref 39.0–52.0)
Hemoglobin: 14.3 g/dL (ref 13.0–17.0)
MCH: 31.4 pg (ref 26.0–34.0)
MCHC: 33.3 g/dL (ref 30.0–36.0)
MCV: 94.5 fL (ref 78.0–100.0)
Platelets: 159 10*3/uL (ref 150–400)
RBC: 4.55 MIL/uL (ref 4.22–5.81)
RDW: 13.6 % (ref 11.5–15.5)
WBC: 9.5 10*3/uL (ref 4.0–10.5)

## 2016-05-07 LAB — APTT: aPTT: 32 seconds (ref 24–36)

## 2016-05-07 LAB — PROTIME-INR
INR: 1.07
Prothrombin Time: 14 seconds (ref 11.4–15.2)

## 2016-05-07 NOTE — Pre-Procedure Instructions (Signed)
[name]  05/07/2016      [id number] Lorina Rabon, [place] [address] [place] [address] Phone: [phone] Fax: [place], [place] [address] [address] Phone: [phone] Fax: [phone]    Your procedure is scheduled on Wed. Feb. 28  Report to [facility] Admitting at 8:10 A.M.  Call this number if you have problems the morning of surgery:  [phone]   Remember:  Do not eat food or drink liquids after midnight.  Take these medicines the morning of surgery with A SIP OF WATER : albuterol inhaler if needed-bring to hospital, albuterol nebulizer if needed, gabapentin(neurontin),metoprolol succinate(toprol-XL), nitroglycerine if needed, eye drops              Stop aspirin, advil, motrin, ibuprofen, aleve, BC Powders, Goody's, vitamins/herbal medicines.   Do not wear jewelry, make-up or nail polish.  Do not wear lotions, powders, or perfumes, or deoderant.  Do not shave 48 hours prior to surgery.  Men may shave face and neck.  Do not bring valuables to the hospital.  [facility] is not responsible for any belongings or valuables.  Contacts, dentures or bridgework may not be worn into surgery.  Leave your suitcase in the car.  After surgery it may be brought to your room.  For patients admitted to the hospital, discharge time will be determined by your treatment team.  Patients discharged the day of surgery will not be allowed to drive home.   Name and phone number of your driver:    Special instructions:   [place]- Preparing For Surgery  Before surgery, you can play an important role. Because skin is not sterile, your skin needs to be as free of germs as possible. You can reduce the number of germs on your skin by washing with CHG (chlorahexidine gluconate) Soap before surgery.  CHG is an antiseptic cleaner which kills germs and bonds with  the skin to continue killing germs even after washing.  Please do not use if you have an allergy to CHG or antibacterial soaps. If your skin becomes reddened/irritated stop using the CHG.  Do not shave (including legs and underarms) for at least 48 hours prior to first CHG shower. It is OK to shave your face.  Please follow these instructions carefully.   1. Shower the NIGHT BEFORE SURGERY and the MORNING OF SURGERY with CHG.   2. If you chose to wash your hair, wash your hair first as usual with your normal shampoo.  3. After you shampoo, rinse your hair and body thoroughly to remove the shampoo.  4. Use CHG as you would any other liquid soap. You can apply CHG directly to the skin and wash gently with a scrungie or a clean washcloth.   5. Apply the CHG Soap to your body ONLY FROM THE NECK DOWN.  Do not use on open wounds or open sores. Avoid contact with your eyes, ears, mouth and genitals (private parts). Wash genitals (private parts) with your normal soap.  6. Wash thoroughly, paying special attention to the area where your surgery will be performed.  7. Thoroughly rinse your body with warm water from the neck down.  8. DO NOT shower/wash with your normal soap after using and rinsing off the CHG Soap.  9. Pat yourself dry with a CLEAN TOWEL.   10. Wear CLEAN PAJAMAS  11. Place CLEAN SHEETS on your bed the night of your first shower and DO NOT SLEEP WITH PETS.    Day of Surgery: Do not apply any deodorants/lotions. Please wear clean clothes to the hospital/surgery center.      Please read over the following fact sheets that you were given. Coughing and Deep Breathing

## 2016-05-07 NOTE — Pre-Procedure Instructions (Addendum)
[name]  05/07/2016      [id number] Lorina Rabon, [place] [address] [place] [address] [place] [address] Phone: [phone] Fax: [place], [place] [address] [place] [address] [address] Phone: [phone] Fax: [phone]    Your procedure is scheduled on 05/12/16  Report to [facility] at 800 A.M.  Call this number if you have problems the morning of surgery:  [phone]   Remember:  Do not eat food or drink liquids after midnight.  Take these medicines the morning of surgery with A SIP OF WATER    Inhaler /nebulizer if needed(bring inhaler ),gabapentin,metropolol, nitro if needed,eye drops  STOP all herbel meds, nsaids (aleve,naproxen,advil,ibuprofen) starting now including all vitamins/supplements  Aspirin per dr   Lazaro Arms not wear jewelry, make-up or nail polish.  Do not wear lotions, powders, or perfumes, or deoderant.  Do not shave 48 hours prior to surgery.  Men may shave face and neck.  Do not bring valuables to the hospital.  [facility] is not responsible for any belongings or valuables.  Contacts, dentures or bridgework may not be worn into surgery.  Leave your suitcase in the car.  After surgery it may be brought to your room.  For patients admitted to the hospital, discharge time will be determined by your treatment team.  Patients discharged the day of surgery will not be allowed to drive home.   Special instructions:   Special Instructions: [place] - Preparing for Surgery  Before surgery, you can play an important role.  Because skin is not sterile, your skin needs to be as free of germs as possible.  You can reduce the number of germs on you skin by washing with CHG (chlorahexidine gluconate) soap before surgery.  CHG is an antiseptic cleaner which kills germs and bonds with the skin to continue killing germs even after washing.  Please DO NOT use if  you have an allergy to CHG or antibacterial soaps.  If your skin becomes reddened/irritated stop using the CHG and inform your nurse when you arrive at Short Stay.  Do not shave (including legs and underarms) for at least 48 hours prior to the first CHG shower.  You may shave your face.  Please follow these instructions carefully:   1.  Shower with CHG Soap the night before surgery and the morning of Surgery.  2.  If you choose to wash your hair, wash your hair first as usual with your normal shampoo.  3.  After you shampoo, rinse your hair and body thoroughly to remove the Shampoo.  4.  Use CHG as you would any other liquid soap.  You can apply chg directly  to the skin and wash gently with scrungie or a clean washcloth.  5.  Apply the CHG Soap to your body ONLY FROM THE NECK DOWN.  Do not use on open wounds or open sores.  Avoid contact with your eyes ears, mouth and genitals (private parts).  Wash genitals (private parts)       with your normal soap.  6.  Wash thoroughly, paying special attention to the area where your surgery will be performed.  7.  Thoroughly rinse your body with warm water from the neck down.  8.  DO NOT shower/wash with your normal soap after using and rinsing off the CHG Soap.  9.  Pat yourself dry with a  clean towel.            10.  Wear clean pajamas.            11.  Place clean sheets on your bed the night of your first shower and do not sleep with pets.  Day of Surgery  Do not apply any lotions/deodorants the morning of surgery.  Please wear clean clothes to the hospital/surgery center.  Please read over the  fact sheets that you were given.

## 2016-05-10 ENCOUNTER — Encounter (HOSPITAL_COMMUNITY): Payer: Self-pay

## 2016-05-10 NOTE — Progress Notes (Signed)
Anesthesia Chart Review:  Pt is a 81 year old male scheduled for video bronchoscopy with endobronchial navigation, placement of fiducial on 05/12/2016 with Modesto Charon, M.D.  - Cardiologist is Fritz Pickerel, MD, who cleared pt for surgery at last office visit 04/28/16 (note scanned in media tab).   PMH includes: CAD (DES x2 to RCA 2011; DES to LCX and cutting balloon to RCA in-stent restenosis 2013), AAA (stable at 3.3cm 07/18/15), ischemic cardiomyopathy, HTN, hyperlipidemia, CLL, thyroid nodule, COPD, glaucoma, hepatitis (as a child), GERD. Uses 2L O2 at bedtime. Former smoker. BMI 29.5.  Anesthesia concerns: Pt reports he cannot lie on R side due to vertigo. Also has trouble turning neck to the right.  Medications include albuterol, ASA 81 mg, metoprolol, rosuvastatin, timolol.  Preoperative labs reviewed.    CT chest 04/05/16:  - Left upper lobe 1.2 x 0.9 x 1.2 cm nodule/mass (series 4 image 22). Given the patient's history of smoking, this is suspicious for malignancy.  - There are regions of scarring throughout the lung bilaterally some noted on the prior CT scan. Within the inferolateral aspect of the right middle lobe, are parenchymal changes which have progressed when can compared to prior CT now spanning over 1.7 cm. New pleural based 1 x 0.7 cm nodule inferior medial right lower lobe. - Increased number of normal size mediastinal lymph nodes without significant change from prior CT. - No adrenal gland enlargement. - 4 mm low-density structure dome of the liver of indeterminate etiology. - No osseous destructive lesion. Degenerative changes thoracic spine with remote anterior wedge compression T1 unchanged. - Coronary artery calcification. - Atherosclerotic changes thoracic aorta with mild ectasia.  EKG 05/16/15: NSR. Inferior infarct, age undetermined.  Nuclear stress test 07/30/15:   Nuclear stress EF: 55%. Dyssynchronous contractile performance  There was no ST segment  deviation noted during stress.  This is a low risk study. No ischemia. No obvious old infarction.  AAA [place] 07/18/15: 3.3 cm distal abdominal aortic aneurysm with aneurysmal dilatation over 6 cm length noted. Similar findings noted on priors study. Recommend followup by [place] in 3 years.  Echo 03/23/16 [facility] cardiovascular): - From Dr. Irven Shelling notes: poor echo window. Wall motion abnormality has review sensitivity. LV cavity is normal in size. Mild diffuse hypokinesis. Mild concentric LVH. Mild decrease in LV systolic function. Grade I diastolic dysfunction. Visual EF is 45%. Calculated EF 38%. RV cavity is borderline dilated. Normal RV function. Trace tricuspid regurgitation. No evidence of pulmonary hypertension.  Cardiac cath 08/12/11:  1. LM: Luminal irregularities 2. LAD: Luminal irregularities 3. LCx: Proximal CX is hazy were OM1 and OM to originate. This is concerning for thrombus/significant disease. OM1 is small with 80-90% proximal stenosis. S/p PTCA/DES to proximal Cx 4. RCA: Overlapping stents from the ostium of the RCA through the mid RCA. Up to 80% in-stent restenosis in proximal RCA and 50% in-stent restenosis in mid RCA. S/p successful cutting balloon angioplasty of in-stent restenosis proximal RCA stent.  If no changes, I anticipate pt can proceed with surgery as scheduled.   Willeen Cass, FNP-BC [facility] Short Stay Surgical Center/Anesthesiology Phone: [phone] 05/10/2016 2:19 PM

## 2016-05-12 ENCOUNTER — Ambulatory Visit (HOSPITAL_COMMUNITY): Payer: Medicare Other | Admitting: Emergency Medicine

## 2016-05-12 ENCOUNTER — Encounter (HOSPITAL_COMMUNITY)
Admission: RE | Disposition: A | Discharge status: Home / Self Care | Payer: Self-pay | Source: Ambulatory Visit | Attending: Thoracic Surgery (Cardiothoracic Vascular Surgery)

## 2016-05-12 ENCOUNTER — Ambulatory Visit (HOSPITAL_COMMUNITY): Payer: Medicare Other | Admitting: Anesthesiology

## 2016-05-12 ENCOUNTER — Ambulatory Visit (HOSPITAL_COMMUNITY)
Admission: RE | Admit: 2016-05-12 | Discharge: 2016-05-12 | Disposition: A | Discharge status: Home / Self Care | Payer: Medicare Other | Source: Ambulatory Visit | Attending: Thoracic Surgery (Cardiothoracic Vascular Surgery) | Admitting: Thoracic Surgery (Cardiothoracic Vascular Surgery)

## 2016-05-12 ENCOUNTER — Ambulatory Visit (HOSPITAL_COMMUNITY): Payer: Medicare Other

## 2016-05-12 ENCOUNTER — Encounter (HOSPITAL_COMMUNITY): Payer: Self-pay | Admitting: *Deleted

## 2016-05-12 DIAGNOSIS — Z7982 Long term (current) use of aspirin: Secondary | ICD-10-CM | POA: Insufficient documentation

## 2016-05-12 DIAGNOSIS — E785 Hyperlipidemia, unspecified: Secondary | ICD-10-CM | POA: Insufficient documentation

## 2016-05-12 DIAGNOSIS — C9110 Chronic lymphocytic leukemia of B-cell type not having achieved remission: Secondary | ICD-10-CM | POA: Diagnosis not present

## 2016-05-12 DIAGNOSIS — J449 Chronic obstructive pulmonary disease, unspecified: Secondary | ICD-10-CM | POA: Insufficient documentation

## 2016-05-12 DIAGNOSIS — Z79899 Other long term (current) drug therapy: Secondary | ICD-10-CM | POA: Insufficient documentation

## 2016-05-12 DIAGNOSIS — I2510 Atherosclerotic heart disease of native coronary artery without angina pectoris: Secondary | ICD-10-CM | POA: Diagnosis not present

## 2016-05-12 DIAGNOSIS — Z419 Encounter for procedure for purposes other than remedying health state, unspecified: Secondary | ICD-10-CM

## 2016-05-12 DIAGNOSIS — Z955 Presence of coronary angioplasty implant and graft: Secondary | ICD-10-CM | POA: Diagnosis not present

## 2016-05-12 DIAGNOSIS — Z87891 Personal history of nicotine dependence: Secondary | ICD-10-CM | POA: Insufficient documentation

## 2016-05-12 DIAGNOSIS — M549 Dorsalgia, unspecified: Secondary | ICD-10-CM | POA: Diagnosis not present

## 2016-05-12 DIAGNOSIS — I10 Essential (primary) hypertension: Secondary | ICD-10-CM | POA: Insufficient documentation

## 2016-05-12 DIAGNOSIS — I714 Abdominal aortic aneurysm, without rupture: Secondary | ICD-10-CM | POA: Insufficient documentation

## 2016-05-12 DIAGNOSIS — I252 Old myocardial infarction: Secondary | ICD-10-CM | POA: Diagnosis not present

## 2016-05-12 DIAGNOSIS — C3412 Malignant neoplasm of upper lobe, left bronchus or lung: Secondary | ICD-10-CM | POA: Insufficient documentation

## 2016-05-12 DIAGNOSIS — D381 Neoplasm of uncertain behavior of trachea, bronchus and lung: Secondary | ICD-10-CM

## 2016-05-12 DIAGNOSIS — N281 Cyst of kidney, acquired: Secondary | ICD-10-CM | POA: Diagnosis not present

## 2016-05-12 DIAGNOSIS — G629 Polyneuropathy, unspecified: Secondary | ICD-10-CM | POA: Insufficient documentation

## 2016-05-12 DIAGNOSIS — Z9981 Dependence on supplemental oxygen: Secondary | ICD-10-CM | POA: Diagnosis not present

## 2016-05-12 DIAGNOSIS — R918 Other nonspecific abnormal finding of lung field: Secondary | ICD-10-CM | POA: Diagnosis present

## 2016-05-12 DIAGNOSIS — I700 Atherosclerosis of aorta: Secondary | ICD-10-CM | POA: Diagnosis not present

## 2016-05-12 DIAGNOSIS — R911 Solitary pulmonary nodule: Secondary | ICD-10-CM

## 2016-05-12 HISTORY — PX: VIDEO BRONCHOSCOPY WITH ENDOBRONCHIAL NAVIGATION: SHX6175

## 2016-05-12 HISTORY — PX: FUDUCIAL PLACEMENT: SHX5083

## 2016-05-12 SURGERY — VIDEO BRONCHOSCOPY WITH ENDOBRONCHIAL NAVIGATION
Anesthesia: General

## 2016-05-12 MED ORDER — FENTANYL CITRATE (PF) 100 MCG/2ML IJ SOLN: 25.0000 ug | INTRAMUSCULAR | Status: DC | PRN

## 2016-05-12 MED ORDER — SUGAMMADEX SODIUM 200 MG/2ML IV SOLN
INTRAVENOUS | Status: DC | PRN
  Administered 2016-05-12: 200 mg via INTRAVENOUS

## 2016-05-12 MED ORDER — ONDANSETRON HCL 4 MG/2ML IJ SOLN
INTRAMUSCULAR | Status: AC
  Filled 2016-05-12: qty 2

## 2016-05-12 MED ORDER — LACTATED RINGERS IV SOLN
INTRAVENOUS | Status: DC
  Administered 2016-05-12: 09:00:00 via INTRAVENOUS

## 2016-05-12 MED ORDER — 0.9 % SODIUM CHLORIDE (POUR BTL) OPTIME
TOPICAL | Status: DC | PRN
  Administered 2016-05-12: 1000 mL

## 2016-05-12 MED ORDER — SUGAMMADEX SODIUM 200 MG/2ML IV SOLN
INTRAVENOUS | Status: AC
  Filled 2016-05-12: qty 2

## 2016-05-12 MED ORDER — LIDOCAINE 2% (20 MG/ML) 5 ML SYRINGE
INTRAMUSCULAR | Status: AC
  Filled 2016-05-12: qty 5

## 2016-05-12 MED ORDER — ONDANSETRON HCL 4 MG/2ML IJ SOLN
INTRAMUSCULAR | Status: DC | PRN
Start: 2016-05-12 — End: 2016-05-12
  Administered 2016-05-12: 4 mg via INTRAVENOUS

## 2016-05-12 MED ORDER — ROCURONIUM BROMIDE 50 MG/5ML IV SOSY
PREFILLED_SYRINGE | INTRAVENOUS | Status: AC
  Filled 2016-05-12: qty 5

## 2016-05-12 MED ORDER — ROCURONIUM BROMIDE 100 MG/10ML IV SOLN
INTRAVENOUS | Status: DC | PRN
  Administered 2016-05-12: 50 mg via INTRAVENOUS

## 2016-05-12 MED ORDER — PROPOFOL 10 MG/ML IV BOLUS
INTRAVENOUS | Status: AC
  Filled 2016-05-12: qty 20

## 2016-05-12 MED ORDER — FENTANYL CITRATE (PF) 100 MCG/2ML IJ SOLN
INTRAMUSCULAR | Status: DC | PRN
  Administered 2016-05-12 (×2): 50 ug via INTRAVENOUS

## 2016-05-12 MED ORDER — FENTANYL CITRATE (PF) 100 MCG/2ML IJ SOLN
INTRAMUSCULAR | Status: AC
  Filled 2016-05-12: qty 2

## 2016-05-12 MED ORDER — MIDAZOLAM HCL 2 MG/2ML IJ SOLN: 0.5000 mg | Freq: Once | INTRAMUSCULAR | Status: DC | PRN

## 2016-05-12 MED ORDER — PROMETHAZINE HCL 25 MG/ML IJ SOLN: 6.2500 mg | INTRAMUSCULAR | Status: DC | PRN

## 2016-05-12 MED ORDER — EPINEPHRINE PF 1 MG/ML IJ SOLN
INTRAMUSCULAR | Status: AC
  Filled 2016-05-12: qty 1

## 2016-05-12 MED ORDER — PHENYLEPHRINE HCL 10 MG/ML IJ SOLN
INTRAVENOUS | Status: DC | PRN
  Administered 2016-05-12: 50 ug/min via INTRAVENOUS

## 2016-05-12 MED ORDER — LIDOCAINE HCL (CARDIAC) 20 MG/ML IV SOLN
INTRAVENOUS | Status: DC | PRN
  Administered 2016-05-12: 40 mg via INTRAVENOUS
  Administered 2016-05-12: 60 mg via INTRAVENOUS

## 2016-05-12 MED ORDER — MEPERIDINE HCL 25 MG/ML IJ SOLN: 6.2500 mg | INTRAMUSCULAR | Status: DC | PRN

## 2016-05-12 MED ORDER — PROPOFOL 10 MG/ML IV BOLUS
INTRAVENOUS | Status: DC | PRN
  Administered 2016-05-12: 70 mg via INTRAVENOUS

## 2016-05-12 SURGICAL SUPPLY — 39 items
ADAPTER BRONCH F/PENTAX (ADAPTER) ×2 IMPLANT
ADPR BSCP EDG PNTX (ADAPTER) ×1
BRUSH SUPERTRAX BIOPSY (INSTRUMENTS) IMPLANT
BRUSH SUPERTRAX NDL-TIP CYTO (INSTRUMENTS) ×2 IMPLANT
CANISTER SUCT 3000ML PPV (MISCELLANEOUS) ×2 IMPLANT
CHANNEL WORK EXTEND EDGE 180 (KITS) IMPLANT
CHANNEL WORK EXTEND EDGE 45 (KITS) IMPLANT
CHANNEL WORK EXTEND EDGE 90 (KITS) IMPLANT
CONT SPEC 4OZ CLIKSEAL STRL BL (MISCELLANEOUS) ×4 IMPLANT
COVER BACK TABLE 60X90IN (DRAPES) ×2 IMPLANT
FILTER STRAW FLUID ASPIR (MISCELLANEOUS) IMPLANT
FORCEPS BIOP SUPERTRX PREMAR (INSTRUMENTS) ×2 IMPLANT
GAUZE SPONGE 4X4 12PLY STRL (GAUZE/BANDAGES/DRESSINGS) ×2 IMPLANT
GLOVE SURG SIGNA 7.5 PF LTX (GLOVE) ×2 IMPLANT
GLOVE SURG SS PI 7.0 STRL IVOR (GLOVE) ×2 IMPLANT
GOWN STRL REUS W/ TWL XL LVL3 (GOWN DISPOSABLE) ×1 IMPLANT
GOWN STRL REUS W/TWL XL LVL3 (GOWN DISPOSABLE) ×2
KIT CLEAN ENDO COMPLIANCE (KITS) ×2 IMPLANT
KIT MARKER FIDUCIAL DELIVERY (KITS) ×2 IMPLANT
KIT PROCEDURE EDGE 180 (KITS) ×2 IMPLANT
KIT PROCEDURE EDGE 45 (KITS) IMPLANT
KIT PROCEDURE EDGE 90 (KITS) IMPLANT
KIT ROOM TURNOVER OR (KITS) ×2 IMPLANT
MARKER FIDUCIAL SL NIT COIL (Implant Marker) ×6 IMPLANT
MARKER SKIN DUAL TIP RULER LAB (MISCELLANEOUS) ×2 IMPLANT
NEEDLE SUPERTRX PREMARK BIOPSY (NEEDLE) ×2 IMPLANT
NS IRRIG 1000ML POUR BTL (IV SOLUTION) ×2 IMPLANT
OIL SILICONE PENTAX (PARTS (SERVICE/REPAIRS)) ×2 IMPLANT
PAD ARMBOARD 7.5X6 YLW CONV (MISCELLANEOUS) ×4 IMPLANT
PATCHES PATIENT (LABEL) ×6 IMPLANT
SYR 20CC LL (SYRINGE) ×2 IMPLANT
SYR 20ML ECCENTRIC (SYRINGE) ×2 IMPLANT
SYR 30ML LL (SYRINGE) ×2 IMPLANT
SYR 5ML LL (SYRINGE) ×2 IMPLANT
TOWEL OR 17X24 6PK STRL BLUE (TOWEL DISPOSABLE) ×2 IMPLANT
TRAP SPECIMEN MUCOUS 40CC (MISCELLANEOUS) ×2 IMPLANT
TUBE CONNECTING 20X1/4 (TUBING) ×4 IMPLANT
UNDERPAD 30X30 (UNDERPADS AND DIAPERS) ×2 IMPLANT
WATER STERILE IRR 1000ML POUR (IV SOLUTION) ×2 IMPLANT

## 2016-05-12 NOTE — Anesthesia Procedure Notes (Signed)
Procedure Name: Intubation Date/Time: 05/12/2016 10:13 AM Performed by: Kyung Rudd Pre-anesthesia Checklist: Patient identified, Emergency Drugs available, Suction available and Patient being monitored Patient Re-evaluated:Patient Re-evaluated prior to inductionOxygen Delivery Method: Circle system utilized Preoxygenation: Pre-oxygenation with 100% oxygen Intubation Type: IV induction Ventilation: Mask ventilation without difficulty Laryngoscope Size: Mac and 4 Grade View: Grade III Tube type: Oral Tube size: 8.5 mm Number of attempts: 1 Airway Equipment and Method: Bougie stylet and LTA kit utilized Placement Confirmation: ETT inserted through vocal cords under direct vision,  positive ETCO2 and breath sounds checked- equal and bilateral Secured at: 22 cm Tube secured with: Tape Dental Injury: Teeth and Oropharynx as per pre-operative assessment

## 2016-05-12 NOTE — Discharge Instructions (Addendum)
° °  Pulmonary Nodule A pulmonary nodule is a small, round spot in your lung. It is usually found when pictures of your lungs are taken for other reasons. Most pulmonary nodules are not cancerous and do not cause symptoms. Tests will be done to make sure the nodule is not cancerous. Pulmonary nodules that are not cancerous usually do not require treatment. Follow these instructions at home:  Only take medicine as told by your doctor.  Follow up with your doctor as told. Contact a doctor if:  You have trouble breathing when doing activities.  You feel sick or more tired than normal.  You do not feel like eating.  You lose weight without trying to.  You have chills.  You have night sweats. Get help right away if:  You cannot catch your breath.  You start making whistling sounds when breathing (wheezing).  You have a cough that does not go away.  You cough up blood.  You are dizzy or feel like you are going to pass out.  You have sudden chest pain.  You have a fever or lasting symptoms for more than 2-3 days.  You have a fever and your symptoms suddenly get worse. This information is not intended to replace advice given to you by your health care provider. Make sure you discuss any questions you have with your health care provider. Document Released: 04/03/2010 Document Revised: 08/07/2015 Document Reviewed: 08/21/2012 Elsevier Interactive Patient Education  2017 [place] not drive or engage in heavy physical activity for 24 hours  You may resume normal activities tomorrow  You may cough up small amounts of blood over the next few days.  Call [phone] if you develop chest pain, shortness of breath, or cough up more than 2 tablespoons of blood  My office will arrange an appointment with radiation oncology to discuss possible radiation therapy

## 2016-05-12 NOTE — Anesthesia Preprocedure Evaluation (Addendum)
Anesthesia Evaluation  Patient identified by MRN, date of birth, ID band Patient awake    Reviewed: Allergy & Precautions, NPO status , Patient's Chart, lab work & pertinent test results, reviewed documented beta blocker date and time   History of Anesthesia Complications Negative for: history of anesthetic complications  Airway Mallampati: II       Dental  (+) Teeth Intact, Dental Advisory Given,    Pulmonary COPD,  COPD inhaler, former smoker,    breath sounds clear to auscultation       Cardiovascular hypertension, Pt. on home beta blockers and Pt. on medications (-) angina+ Past MI, + Cardiac Stents and + Peripheral Vascular Disease (2.7 AAA)   Rhythm:Regular Rate:Normal  '17 ECHO: EF30% to 35%. Diffuse hypokinesis.Valves OK 5/17 Stress:  Nuclear stress EF: 55%. Dyssynchronous contractile performance, no ST segment deviation noted during stress. low risk study. No ischemia. No obvious old infarction.   Neuro/Psych    GI/Hepatic neg GERD  ,(+) Hepatitis -  Endo/Other  negative endocrine ROS  Renal/GU negative Renal ROS     Musculoskeletal   Abdominal   Peds  Hematology negative hematology ROS (+)   Anesthesia Other Findings   Reproductive/Obstetrics                           Anesthesia Physical Anesthesia Plan  ASA: III  Anesthesia Plan: General   Post-op Pain Management:    Induction: Intravenous  Airway Management Planned: Oral ETT  Additional Equipment:   Intra-op Plan:   Post-operative Plan: Extubation in OR  Informed Consent: I have reviewed the patients History and Physical, chart, labs and discussed the procedure including the risks, benefits and alternatives for the proposed anesthesia with the patient or authorized representative who has indicated his/her understanding and acceptance.   Dental advisory given  Plan Discussed with: CRNA and Surgeon  Anesthesia  Plan Comments: (Plan routine monitors, GETA)        Anesthesia Quick Evaluation

## 2016-05-12 NOTE — Brief Op Note (Signed)
05/12/2016  11:18 AM  PATIENT:  [name]  81 y.o. male  PRE-OPERATIVE DIAGNOSIS:  left upper lobe nodule   POST-OPERATIVE DIAGNOSIS:  Non-small cell carcinoma- clinical stage IA  PROCEDURE:  Procedure(s): VIDEO BRONCHOSCOPY WITH ENDOBRONCHIAL NAVIGATION (N/A) PLACEMENT OF FUDUCIAL (N/A)  SURGEON:  Surgeon(s) and Role:    * Melrose Nakayama, MD - Primary  ASSISTANTS: none   ANESTHESIA:   general  EBL:  No intake/output data recorded.  BLOOD ADMINISTERED:none  DRAINS: none   LOCAL MEDICATIONS USED:  NONE  SPECIMEN:  Source of Specimen:  LUL nodule  DISPOSITION OF SPECIMEN:  PATHOLOGY  PLAN OF CARE: Discharge to home after PACU  PATIENT DISPOSITION:  PACU - hemodynamically stable.   Delay start of Pharmacological VTE agent (>24hrs) due to surgical blood loss or risk of bleeding: not applicable  Brushings and needle aspirations + non-small cell carcinoma

## 2016-05-12 NOTE — Interval H&P Note (Signed)
History and Physical Interval Note:  05/12/2016 9:36 AM  [name]  has presented today for surgery, with the diagnosis of LUL NODULE  The various methods of treatment have been discussed with the patient and family. After consideration of risks, benefits and other options for treatment, the patient has consented to  Procedure(s): [place] (N/A) PLACEMENT OF FUDUCIAL (N/A) as a surgical intervention .  The patient's history has been reviewed, patient examined, no change in status, stable for surgery.  I have reviewed the patient's chart and labs.  Questions were answered to the patient's satisfaction.     Melrose Nakayama

## 2016-05-12 NOTE — Transfer of Care (Signed)
Immediate Anesthesia Transfer of Care Note  Patient: [name]  Procedure(s) Performed: Procedure(s): VIDEO BRONCHOSCOPY WITH ENDOBRONCHIAL NAVIGATION (N/A) PLACEMENT OF FUDUCIAL (N/A)  Patient Location: PACU  Anesthesia Type:General  Level of Consciousness: awake, alert  and oriented  Airway & Oxygen Therapy: Patient Spontanous Breathing and Patient connected to nasal cannula oxygen  Post-op Assessment: Report given to RN, Post -op Vital signs reviewed and stable and Patient moving all extremities X 4  Post vital signs: Reviewed and stable  Last Vitals:  Vitals:   05/12/16 0916 05/12/16 1125  BP: 125/75   Pulse: 78   Resp: 20   Temp: 37.1 C (P) 36.1 C    Last Pain:  Vitals:   05/12/16 0916  TempSrc: Oral         Complications: No apparent anesthesia complications

## 2016-05-12 NOTE — H&P (View-Only) (Signed)
[address]       [place] [address]             [phone]    HPI: [name] returns today to discuss results of his pulmonary function testing and PET/CT.  He is a 81 year old male with a past history of tobacco abuse (100 pack years), COPD, coronary disease with 2 previous MIs and 2 previous stents. He also has a history of back pain, peripheral neuropathy, hypertension, hyperlipidemia, cervical spine fracture, and chronic lymphocytic leukemia. He presented in January with right-sided chest pain. Rib films showed an interesting nodule in the anterior right lung base. A CT of the chest was done. The questionable lung nodule was parenchymal changes in the right middle lobe. He did however have a 1.2 cm left upper lobe spiculated nodule that was highly suspicious. There was no mediastinal or hilar adenopathy.  [name] quit smoking in 2008. He uses home oxygen at night, but not during the day even with exercise. He did a 6 minute walk test of 1100 feet in pulmonary rehabilitation.   Zubrod Score: At the time of surgery this patient's most appropriate activity status/level should be described as: '[]'$     0    Normal activity, no symptoms '[x]'$     1    Restricted in physical strenuous activity but ambulatory, able to do out light work '[]'$     2    Ambulatory and capable of self care, unable to do work activities, up and about >50 % of waking hours                              '[]'$     3    Only limited self care, in bed greater than 50% of waking hours '[]'$     4    Completely disabled, no self care, confined to bed or chair '[]'$     5    Moribund   Current Outpatient Prescriptions  Medication Sig Dispense Refill  . albuterol (PROAIR HFA) 108 (90 BASE) MCG/ACT inhaler Inhale 2 puffs into the lungs every 6 (six) hours as needed. For shortness of breath    . albuterol (PROVENTIL) (2.5 MG/3ML) 0.083% nebulizer solution Take 2.5 mg by nebulization every 6 (six) hours as needed for  wheezing or shortness of breath.    Marland Kitchen alendronate (FOSAMAX) 70 MG tablet Take 70 mg by mouth once a week. Take with a full glass of water on an empty stomach.    Marland Kitchen aspirin 81 MG tablet Take 81 mg by mouth daily.     Marland Kitchen CALCIUM-MAGNESIUM-VITAMIN D PO Take 1 tablet by mouth 2 (two) times daily.    . dorzolamide-timolol (COSOPT) 22.3-6.8 MG/ML ophthalmic solution Place 1 drop into both eyes 2 (two) times daily.    Marland Kitchen gabapentin (NEURONTIN) 300 MG capsule Take 600 mg by mouth 2 (two) times daily.     Marland Kitchen latanoprost (XALATAN) 0.005 % ophthalmic solution Place 1 drop into both eyes at bedtime.     . metoprolol succinate (TOPROL-XL) 25 MG 24 hr tablet Take 25 mg by mouth every morning.     . nitroGLYCERIN (NITROSTAT) 0.4 MG SL tablet Place 0.4 mg under the tongue every 5 (five) minutes as needed. For chest pain    . rosuvastatin (CRESTOR) 20 MG tablet Take 20 mg by mouth daily.    . Tamsulosin HCl (FLOMAX) 0.4 MG CAPS Take 0.4 mg  by mouth every evening.      No current facility-administered medications for this visit.     Physical Exam BP 134/88   Pulse 80   Resp 20   Ht '5\' 11"'$  (1.803 m)   Wt 208 lb (94.3 kg)   SpO2 94% Comment: RA  BMI 29.29 kg/m  81 year old man in no acute distress Alert and oriented 3 with no focal deficits No cervical or supraclavicular adenopathy Diminished breath sounds bilaterally, no wheezing Cardiac regular rate and rhythm normal S1 and S2  Diagnostic Tests: NUCLEAR MEDICINE PET SKULL BASE TO THIGH  TECHNIQUE: 10.3 mCi F-18 FDG was injected intravenously. Full-ring PET imaging was performed from the skull base to thigh after the radiotracer. CT data was obtained and used for attenuation correction and anatomic localization.  FASTING BLOOD GLUCOSE:  Value: 109 mg/dl  COMPARISON:  Chest CT from 04/05/2016  FINDINGS: NECK  Hypermetabolic right thyroid nodule, maximum SUV 8.3. No adenopathy in the neck identified.  CHEST  The spiculated left  upper lobe nodule measures approximately 1.8 cm transverse and 1.3 cm anterior- posterior, with a maximum SUV of 11.9  Emphysema. Mild scarring at the lung bases. A 5 mm in parenchymal short axis left axillary lymph node on image 56/4 has a maximum SUV of 2.7, likely benign. Background mediastinal blood pool activity 4.2. I do not perceive any definite hypermetabolic thoracic adenopathy  Coronary, aortic arch, and branch vessel atherosclerotic vascular disease.  ABDOMEN/PELVIS  Photopenic cysts in both kidneys. No adrenal mass or pathologic abdominopelvic adenopathy.  Aortoiliac atherosclerotic vascular disease. Abdominal aortic aneurysm, 3.2 cm diameter. Postoperative findings in the lumbar spine. Postoperative findings in the cervical spine.  SKELETON  No focal hypermetabolic activity to suggest skeletal metastasis.  IMPRESSION: 1. The left upper lobe nodule has a maximum SUV of 11.9, suspicious for lung cancer. 2. Hypermetabolic right thyroid nodule, maximum SUV 8.3. A significant minority of hypermetabolic thyroid nodules turn out to be thyroid cancer and this warrants further investigation with thyroid sonography and likely fine-needle aspiration. 3. Abdominal aortic aneurysm, 3.2 cm in diameter. Recommend followup by ultrasound in 3 years. This recommendation follows ACR consensus guidelines: White Paper of the ACR Incidental Findings Committee II on Vascular Findings. J Am Coll Radiol 2013; 29:476-546 4. Other imaging findings of potential clinical significance: Emphysema. Scarring in the lung bases. Coronary, aortic arch, and branch vessel atherosclerotic vascular disease. Aortoiliac atherosclerotic vascular disease. Postoperative findings in the cervical and lumbar spine. Renal cysts.   Electronically Signed   By: Van Clines M.D.   On: 04/26/2016 16:11  I personally reviewed the PET/CT  Findings noted above. Of note he had a right thyroid  nodule aspirated 2016 which showed a benign follicular nodule.  Pulmonary function testing FVC 2.48 (60%) FEV1 1.14 (38%) FEV1/FVC 0.46 (63%) FEV1 postbronchodilator 1.39 (47%) DLCO 9.82 (30%)  Impression: [name] is a 81 year old man with a history of heavy tobacco abuse who has a newly discovered 1.2 cm left upper lobe nodule that is highly suspicious for a new primary bronchogenic carcinoma. This nodule is markedly hypermetabolic on PET CT which further increases the degree of suspicion. Of note the subpleural nodule in the right lower lobe in the paravertebral area was not hypermetabolic. There was no evidence of regional or distant metastases.  I had a long discussion with Mr. and Mrs. Casten. We reviewed the PET images. Despite his good 6 minute walk test with pulmonary rehabilitation I am concerned about his ability to tolerate a  significant pulmonary resection given his DLCO of 30% of baseline. I think that rules out lobectomy and segmentectomy. I do think that we could get him through a wedge resection without adversely impacting his quality of life but there would be a higher risk of recurrence with a limited resection. The other option would be to treat him with stereotactic radiation. I suspect the results of with a wedge and stereotactic radiation would be fairly similar, especially when taking into account perioperative morbidity.  I advised him [name] to have a navigational bronchoscopy to biopsy the lesion and place fiducial markers for potential stereotactic radiation. If he socially changes mind and wishes to have wedge resection instead of stereotactic radiation that will not affect our ability to do so. He understands this is strictly diagnostic and not therapeutic. It would be done in the operating room under general anesthesia. He understands that is an endoscopic procedure. No guarantee can be made that we will make a definitive diagnosis. I reviewed the indications, risks,  benefits, and alternatives. He understands the risk include those associated with general anesthesia. Procedure specific risks include failure to make a diagnosis, pneumothorax, and bleeding. We will plan to do this on an outpatient basis.  He accepts the risks and agrees to proceed.   Plan:  Navigational bronchoscopy for biopsy and fiducial placement on Wednesday, 05/12/2016  Melrose Nakayama, MD [facility] [phone]

## 2016-05-12 NOTE — Op Note (Signed)
NAMEHANZEL, PIZZO NO.:  [id number]  MEDICAL RECORD NO.:  [id number]  LOCATION:                                 FACILITY:  PHYSICIAN:  Revonda Standard. Roxan Hockey, M.D.DATE OF BIRTH:  1935/05/04  DATE OF PROCEDURE:  05/12/2016 DATE OF DISCHARGE:                              OPERATIVE REPORT   PREOPERATIVE DIAGNOSIS:  Left upper lobe nodule.  POSTOPERATIVE DIAGNOSIS:  Non-small cell carcinoma, clinical stage IA.  PROCEDURE:  Electromagnetic navigational bronchoscopy with transbronchial needle aspirations, brushings, biopsies, and fiducial placement.  SURGEON:  Revonda Standard. Roxan Hockey, M.D.  ASSISTANT:  None.  ANESTHESIA:  General.  FINDINGS:  Needle aspirations and brushings positive for non-small cell carcinoma.  CLINICAL NOTE:  Mr. Carpenter is a 81 year old gentleman with a history of tobacco abuse, who recently was found to have a 1.2 cm left upper lobe nodule, highly suspicious for cancer.  There was no mediastinal or hilar adenopathy.  He was not a candidate for an anatomic surgical resection and was advised to have a navigational bronchoscopy and placement of fiducial markers for possible radiation.  The indications, risks, benefits, and alternatives were discussed in detail with the patient. He understood and accepted the risks and agreed to proceed.  OPERATIVE NOTE:  Mr. Gargis was brought to the preoperative holding area and then to the operating room on May 12, 2016. He had induction of general anesthesia and was intubated.  After performing a time-out, flexible fiberoptic bronchoscopy was performed via the endotracheal tube.  There was normal endobronchial anatomy and no endobronchial lesions to the level of subsegmental bronchi.  The locatable guide for navigation was placed. The target in the left upper lobe had been planned preoperatively.  The locatable guide then was advanced to the appropriate subsegmental bronchus and advanced to the left  upper lobe nodule.  It was within 0.5 cm of the center of the nodule with good alignment.  All sampling was performed under fluoroscopy.  Needle aspirations were performed.  Three needle aspirations were done followed by 3 brushings with a needle brush.  Finally, multiple biopsies were obtained.  The needle brushings and needle aspirations were sent for quick prep.  All sampling was done under fluoroscopy and the total fluoroscopy time for the procedure was 3.1 minutes and the total radiation dose was 28 mGy.  While awaiting the results of the quick preps, the locatable guide was navigated through a separate subsegmental bronchus again into good alignment with the nodule.  Brushings and biopsies were repeated. By this point the quick prep returned showing non-small cell carcinoma with both the needle aspirations and the brushings. Three fiducial markers then were placed as guided by the super dimension computer program.  The sheath then was removed. A final inspection was made. There was no ongoing bleeding.  The bronchoscope was removed.  The patient was extubated in the operating room and taken to the [facility] in good condition.     Revonda Standard Roxan Hockey, M.D.     SCH/MEDQ  D:  05/12/2016  T:  05/12/2016  Job:  [id number]

## 2016-05-12 NOTE — Anesthesia Postprocedure Evaluation (Signed)
Anesthesia Post Note  Patient: [name]  Procedure(s) Performed: Procedure(s) (LRB): VIDEO BRONCHOSCOPY WITH ENDOBRONCHIAL NAVIGATION (N/A) PLACEMENT OF FUDUCIAL (N/A)  Patient location during evaluation: PACU Anesthesia Type: General Level of consciousness: awake and alert, oriented and patient cooperative Pain management: pain level controlled Vital Signs Assessment: post-procedure vital signs reviewed and stable Respiratory status: spontaneous breathing, nonlabored ventilation, respiratory function stable and patient connected to nasal cannula oxygen Cardiovascular status: blood pressure returned to baseline and stable Postop Assessment: no signs of nausea or vomiting Anesthetic complications: no       Last Vitals:  Vitals:   05/12/16 1225 05/12/16 1310  BP: 123/75 115/71  Pulse: 77 76  Resp: 15 11  Temp:  36.1 C    Last Pain:  Vitals:   05/12/16 0916  TempSrc: Oral                 Feige Lowdermilk,E. Shakeera Rightmyer

## 2016-05-13 ENCOUNTER — Encounter (HOSPITAL_COMMUNITY): Payer: Self-pay | Admitting: Thoracic Surgery (Cardiothoracic Vascular Surgery)

## 2016-05-13 ENCOUNTER — Encounter: Payer: Self-pay | Admitting: *Deleted

## 2016-05-13 ENCOUNTER — Telehealth: Payer: Self-pay | Admitting: *Deleted

## 2016-05-13 NOTE — Progress Notes (Signed)
Oncology Nurse Navigator Documentation  Oncology Nurse Navigator Flowsheets 05/13/2016  Navigator Location [facility]  Navigator Encounter Type Other/MTOC letter sent in the mail  Barriers/Navigation Needs Education  Education Other  Interventions Education  Education Method Written  Acuity Level 1  Time Spent with Patient 30

## 2016-05-13 NOTE — Telephone Encounter (Signed)
Oncology Nurse Navigator Documentation  Oncology Nurse Navigator Flowsheets 05/13/2016  Navigator Location [facility]  Referral date to RadOnc/MedOnc 05/13/2016  Navigator Encounter Type Telephone/I received referral on [name] today.  I called and scheduled him to be seen at [facility] on 05/20/16 arrive at 2:45.  He verbalized understanding of appt time and place. I will updated Rad Onc schedulers of appt.   Telephone Outgoing Call  Treatment Phase Pre-Tx/Tx Discussion  Barriers/Navigation Needs Coordination of Care  Interventions Coordination of Care  Coordination of Care Appts  Acuity Level 1  Acuity Level 1 Initial guidance, education and coordination as needed  Time Spent with Patient 30

## 2016-05-20 ENCOUNTER — Ambulatory Visit
Admission: RE | Admit: 2016-05-20 | Discharge: 2016-05-20 | Disposition: A | Discharge status: Home / Self Care | Payer: Medicare Other | Source: Ambulatory Visit | Attending: Radiation Oncology | Admitting: Radiation Oncology

## 2016-05-20 ENCOUNTER — Encounter: Payer: Self-pay | Admitting: *Deleted

## 2016-05-20 ENCOUNTER — Ambulatory Visit: Payer: Medicare Other | Attending: Radiation Oncology | Admitting: Physical Therapy

## 2016-05-20 VITALS — BP 136/82 | HR 80 | Temp 98.1°F | Resp 18 | Wt 208.7 lb

## 2016-05-20 DIAGNOSIS — R293 Abnormal posture: Secondary | ICD-10-CM | POA: Diagnosis present

## 2016-05-20 DIAGNOSIS — R29898 Other symptoms and signs involving the musculoskeletal system: Secondary | ICD-10-CM | POA: Diagnosis present

## 2016-05-20 DIAGNOSIS — D381 Neoplasm of uncertain behavior of trachea, bronchus and lung: Secondary | ICD-10-CM

## 2016-05-20 NOTE — Progress Notes (Signed)
[facility] Clinical Social Work  Clinical Social Work met with patient/family at [employer] appointment to offer support and assess for psychosocial needs.  Patient was accompanied by his spouse and daughter.  He shared he has no concerns at this time.  His only concern was scheduling his radiation appointments around his upcoming trip to visit his granddaughter at college.    Clinical Social Work briefly discussed Clinical Social Work role and [employer] support programs/services.  Clinical Social Work encouraged patient to call with any additional questions or concerns.   Polo Riley, MSW, LCSW, OSW-C Clinical Social Worker [facility] [phone]

## 2016-05-20 NOTE — Progress Notes (Addendum)
Radiation Oncology         [phone] ________________________________  Initial Outpatient Consultation  Name: RAAHIM SHARTZER MRN: [id number]  Date: 05/20/2016  DOB: 1935-05-21  HQ:PRFFMB,WGYKZL, MD  Melrose Nakayama, *   REFERRING PHYSICIAN: Melrose Nakayama, *  DIAGNOSIS: Clinical stage I non small cell lung cancer presenting in the left upper lobe  HISTORY OF PRESENT ILLNESS::[name] is a 81 y.o. male who is seen today out of the courtesy of Dr. Roxan Hockey as part of the multidisciplinary lung clinic. He presented in January for right-sided chest pain. X-ray of the right ribs and chest was performed on 03/17/16. This revealed a new indistinct 18 mm anterior right lung base nodule.  A chest CT on 04/05/16 showed parenchymal changes in the right middle lobe as well as a 1.2 x 0.9 x 1.2 cm left upper lobe spiculated nodule that was highly suspicious for malignancy. PET scan from skull to thigh on 04/26/16 showed left upper nodule with a maximum SUV of 93.5, a hypermetabolic right thyroid nodule with a maximum SUV of 8.3, and a 3.2 cm abdominal aortic aneurysm. Chest x-ray on 05/12/16 showed no evidence of pneumonia nor CHF.   Patient was taken to the operating room on February 28 by Dr. Roxan Hockey. The patient underwent electro- magnetic navigational bronchoscopy with transbronchial needle aspirations, brushings and biopsies as well as fiducial marker placement for anticipated stereotactic body radiation therapy for definitive management.   Patient presents today to determine the role of radiation in terms of his treatment plan.  Of note, patient reports using 2L /min nasal cannula 2 at night.  PREVIOUS RADIATION THERAPY: No  PAST MEDICAL HISTORY:  has a past medical history of AAA (abdominal aortic aneurysm) ([place]); Arthritis; Back pain; BPH (benign prostatic hypertrophy); Bruises easily; CAD (coronary artery disease); Cervical spine fracture (HCC) (feb 1990); CLL  (chronic lymphocytic leukemia) ([place]); Complication of anesthesia; COPD (chronic obstructive pulmonary disease) ([place]) (may 2005); Erectile dysfunction; Fatigue; GERD (gastroesophageal reflux disease); Glaucoma; Hepatitis (as child); History of oxygen administration; Hyperlipidemia; Hypertension; Impaired hearing; Ischemic cardiomyopathy; Leg pain; Lumbar disc disease; Microscopic hematuria; Myocardial infarct (03-17-2009); Neuropathy ([place]); Osteopenia; Pneumonia; and Thyroid nodule.    PAST SURGICAL HISTORY: Past Surgical History:  Procedure Laterality Date  . APPENDECTOMY  sept, 10, 2005  . BACK SURGERY  2009   L 2 and L 3   . benign cyst removed from left neck  02-2010  . CERVICAL DISC SURGERY  2005-2008   C 2, C 3 and C 4 2005, rods placed C 6, C 7 and T 1  . COLONOSCOPY WITH PROPOFOL N/A 11/26/2014   Procedure: COLONOSCOPY WITH PROPOFOL;  Surgeon: Garlan Fair, MD;  Location: [facility];  Service: Endoscopy;  Laterality: N/A;  . CORONARY ANGIOPLASTY WITH STENT PLACEMENT  2013   1 stent repair, one stent replaced  . ESOPHAGOGASTRODUODENOSCOPY (EGD) WITH PROPOFOL N/A 11/27/2013   Procedure: ESOPHAGOGASTRODUODENOSCOPY (EGD) WITH PROPOFOL;  Surgeon: Garlan Fair, MD;  [facility];  Service: Endoscopy;  Laterality: N/A;  . EYE SURGERY Bilateral july 2011   eye surgry for glaucoma  . FUDUCIAL PLACEMENT N/A 05/12/2016   Procedure: PLACEMENT OF FUDUCIAL;  Surgeon: Melrose Nakayama, MD;  Location: [facility];  Service: Thoracic;  Laterality: N/A;  . fusion L 3 and L 4  August 15, 2008  . LEFT HEART CATHETERIZATION WITH CORONARY ANGIOGRAM N/A 08/12/2011   Procedure: LEFT HEART CATHETERIZATION WITH CORONARY ANGIOGRAM;  Surgeon: Larey Dresser, MD;  Location: [facility] CATH LAB;  Service: Cardiovascular;  Laterality: N/A;  . NASAL SINUS SURGERY   oct 2005  . right knee arthroscopy  1981  . stent top heart  2011   x 2  . TONSILLECTOMY  1943  . VIDEO BRONCHOSCOPY WITH ENDOBRONCHIAL NAVIGATION  N/A 05/12/2016   Procedure: VIDEO BRONCHOSCOPY WITH ENDOBRONCHIAL NAVIGATION;  Surgeon: Melrose Nakayama, MD;  [facility];  Service: Thoracic;  Laterality: N/A;    FAMILY HISTORY: family history includes Alzheimer's disease in his mother; Lung cancer (age of onset: 50) in his father.  SOCIAL HISTORY:  reports that he quit smoking about 9 years ago. His smoking use included Cigarettes. He has a 110.00 pack-year smoking history. He has never used smokeless tobacco. He reports that he does not drink alcohol or use drugs.  ALLERGIES: Entresto [sacubitril-valsartan]; Iodinated diagnostic agents; Penicillins; Lumigan [bimatoprost]; Lyrica [pregabalin]; Sulfonamide derivatives; Symbicort [budesonide-formoterol fumarate]; Valsartan; Bacitracin; Brimonidine tartrate; Ciprofloxacin; Pulmicort [budesonide]; Qvar [beclomethasone]; Spiriva [tiotropium bromide monohydrate]; Adhesive [tape]; and Flonase [fluticasone propionate]  MEDICATIONS:  Current Outpatient Prescriptions  Medication Sig Dispense Refill  . albuterol (PROAIR HFA) 108 (90 BASE) MCG/ACT inhaler Inhale 2 puffs into the lungs every 6 (six) hours as needed for wheezing or shortness of breath.     Marland Kitchen albuterol (PROVENTIL) (2.5 MG/3ML) 0.083% nebulizer solution Take 2.5 mg by nebulization every 6 (six) hours as needed for wheezing or shortness of breath.    Marland Kitchen alendronate (FOSAMAX) 70 MG tablet Take 70 mg by mouth once a week. Take with a full glass of water on an empty stomach.  Saturdays    . aspirin 81 MG tablet Take 81 mg by mouth daily.     Marland Kitchen CALCIUM-MAGNESIUM-VITAMIN D PO Take 1 tablet by mouth daily.    Marland Kitchen gabapentin (NEURONTIN) 300 MG capsule Take 600 mg by mouth 2 (two) times daily.     . hydrocortisone 1 % lotion Apply 1 application topically daily as needed for itching.    . lactobacillus acidophilus (BACID) TABS tablet Take 2 tablets by mouth 3 (three) times daily. florajen 20 billion daily    . latanoprost (XALATAN) 0.005 %  ophthalmic solution Place 1 drop into both eyes at bedtime.     . metoprolol succinate (TOPROL-XL) 25 MG 24 hr tablet Take 25 mg by mouth every morning.     . nitroGLYCERIN (NITROSTAT) 0.4 MG SL tablet Place 0.4 mg under the tongue every 5 (five) minutes as needed for chest pain. For chest pain     . rosuvastatin (CRESTOR) 20 MG tablet Take 20 mg by mouth daily.    . Tamsulosin HCl (FLOMAX) 0.4 MG CAPS Take 0.4 mg by mouth every evening.     . timolol (TIMOPTIC) 0.5 % ophthalmic solution Place 1 drop into both eyes 2 (two) times daily.     No current facility-administered medications for this encounter.     REVIEW OF SYSTEMS:  A 12 point review of systems is documented in the electronic medical record. This was obtained by the nursing staff. However, I reviewed this with the patient to discuss relevant findings and make appropriate changes.     PHYSICAL EXAM:  weight is 208 lb 11.2 oz (94.7 kg). His oral temperature is 98.1 F (36.7 C). His blood pressure is 136/82 and his pulse is 80. His respiration is 18 and oxygen saturation is 94%.   General: Alert and oriented, in no acute distress,Accompanied by wife and daughter on evaluation today HEENT: Head is normocephalic. Extraocular  movements are intact. Oropharynx is clear. Neck: Neck is supple, no palpable cervical or supraclavicular lymphadenopathy. Heart: Regular in rate and rhythm with no murmurs, rubs, or gallops. Chest: Clear to auscultation bilaterally, with no rhonchi, wheezes, or rales. Abdomen: Soft, nontender, nondistended, with no rigidity or guarding. Extremities: No cyanosis or edema. Lymphatics: see Neck Exam Skin: No concerning lesions. Musculoskeletal: symmetric strength and muscle tone throughout. Neurologic: Cranial nerves II through XII are grossly intact. No obvious focalities. Speech is fluent. Coordination is intact. Psychiatric: Judgment and insight are intact. Affect is appropriate.   ECOG = 1  LABORATORY DATA:    Lab Results  Component Value Date   WBC 9.5 05/07/2016   HGB 14.3 05/07/2016   HCT 43.0 05/07/2016   MCV 94.5 05/07/2016   PLT 159 05/07/2016   NEUTROABS 6.2 08/21/2011   Lab Results  Component Value Date   NA 143 05/07/2016   K 5.0 05/07/2016   CL 105 05/07/2016   CO2 31 05/07/2016   GLUCOSE 146 (H) 05/07/2016   CREATININE 1.13 05/07/2016   CALCIUM 9.9 05/07/2016      RADIOGRAPHY: Dg Chest 2 View  Result Date: 05/12/2016 CLINICAL DATA:  Preoperative study prior to left lung bronchoscopy for left upper lobe pulmonary nodule. History of COPD, former heavy smoker. EXAM: CHEST  2 VIEW COMPARISON:  PET-CT study of February 23, 2017 and chest x-ray of March 17, 2016. FINDINGS: The lungs are well-expanded. The interstitial markings are mildly prominent. Subtle nodularity is noted overlying the posterior aspect of the left fifth rib. There is stable density in the lingula. The heart and pulmonary vascularity are normal. There is calcification in the wall of the aortic arch. There is no pleural effusion. There is multilevel degenerative disc disease of the thoracic spine fifth with mild anterior wedging of T8 more conspicuous than in June of 2017. IMPRESSION: No pneumonia nor CHF. Chronic bronchitic changes, stable. Subtle nodularity in the left upper lobe for which bronchoscopy is planned. Thoracic aortic atherosclerosis. Electronically Signed   By: David  Martinique M.D.   On: 05/12/2016 08:10   Nm Pet Image Initial (pi) Skull Base To Thigh  Result Date: 04/26/2016 CLINICAL DATA:  Initial treatment strategy for left upper lobe nodule. EXAM: NUCLEAR MEDICINE PET SKULL BASE TO THIGH TECHNIQUE: 10.3 mCi F-18 FDG was injected intravenously. Full-ring PET imaging was performed from the skull base to thigh after the radiotracer. CT data was obtained and used for attenuation correction and anatomic localization. FASTING BLOOD GLUCOSE:  Value: 109 mg/dl COMPARISON:  Chest CT from 04/05/2016 FINDINGS:  NECK Hypermetabolic right thyroid nodule, maximum SUV 8.3. No adenopathy in the neck identified. CHEST The spiculated left upper lobe nodule measures approximately 1.8 cm transverse and 1.3 cm anterior- posterior, with a maximum SUV of 11.9 Emphysema. Mild scarring at the lung bases. A 5 mm in parenchymal short axis left axillary lymph node on image 56/4 has a maximum SUV of 2.7, likely benign. Background mediastinal blood pool activity 4.2. I do not perceive any definite hypermetabolic thoracic adenopathy Coronary, aortic arch, and branch vessel atherosclerotic vascular disease. ABDOMEN/PELVIS Photopenic cysts in both kidneys. No adrenal mass or pathologic abdominopelvic adenopathy. Aortoiliac atherosclerotic vascular disease. Abdominal aortic aneurysm, 3.2 cm diameter. Postoperative findings in the lumbar spine. Postoperative findings in the cervical spine. SKELETON No focal hypermetabolic activity to suggest skeletal metastasis. IMPRESSION: 1. The left upper lobe nodule has a maximum SUV of 11.9, suspicious for lung cancer. 2. Hypermetabolic right thyroid nodule, maximum SUV 8.3. A  significant minority of hypermetabolic thyroid nodules turn out to be thyroid cancer and this warrants further investigation with thyroid sonography and likely fine-needle aspiration. 3. Abdominal aortic aneurysm, 3.2 cm in diameter. Recommend followup by ultrasound in 3 years. This recommendation follows ACR consensus guidelines: White Paper of the ACR Incidental Findings Committee II on Vascular Findings. J Am Coll Radiol 2013; 02:637-858 4. Other imaging findings of potential clinical significance: Emphysema. Scarring in the lung bases. Coronary, aortic arch, and branch vessel atherosclerotic vascular disease. Aortoiliac atherosclerotic vascular disease. Postoperative findings in the cervical and lumbar spine. Renal cysts. Electronically Signed   By: Van Clines M.D.   On: 04/26/2016 16:11   Dg C-arm Bronchoscopy  Result  Date: 05/12/2016 C-ARM BRONCHOSCOPY: Fluoroscopy was utilized by the requesting physician.  No radiographic interpretation.      IMPRESSION: Clinical stage I non small cell lung cancer ( adenocarcinoma) presenting in the left upper lobe. Patient has been thoroughly evaluated by Dr. Roxan Hockey and patient is not felt to be a good candidate for surgery given his medical issues and respiratory status.  We discussed the course of treatment, side effects, and potential toxicities of the treatment with the patient, his wife, and daughter. He appears to understand and wishes to proceed with radiation. A consent form was signed and a copy was placed in the patient's chart.  PLAN: CT simulation and treatment planning is scheduled for 05/26/16 at 2 pm. Anticipate treatment to begin following the patient's vacation from 03/29-04/03.    ------------------------------------------------  Blair Promise, PhD, MD  This document serves as a record of services personally performed by Gery Pray, MD. It was created on his behalf by Bethann Humble, a trained medical scribe. The creation of this record is based on the scribe's personal observations and the provider's statements to them. This document has been checked and approved by the attending provider.

## 2016-05-20 NOTE — Therapy (Addendum)
[facility] [place], [place], [address] Phone: [phone]   Fax:  [phone]  Physical Therapy Evaluation  Patient Details  Name: [name] MRN: [id number] Date of Birth: 07-31-1935 Referring Provider: Dr. Gery Pray  Encounter Date: 05/20/2016      PT End of Session - 05/20/16 2119    Visit Number 1   Number of Visits 1   PT Start Time 1450   PT Stop Time 1515   PT Time Calculation (min) 25 min   Activity Tolerance Patient tolerated treatment well   Behavior During Therapy [facility] for tasks assessed/performed;Restless      Past Medical History:  Diagnosis Date  . AAA (abdominal aortic aneurysm) (HCC)    measures 2.7 cm.  . Arthritis   . Back pain   . BPH (benign prostatic hypertrophy)   . Bruises easily   . CAD (coronary artery disease)   . Cervical spine fracture ([place]) feb 1990   C 6, C 7 and T 1, no surgery done wore halo brace  . CLL (chronic lymphocytic leukemia) ([place])    hx of worked up 2009 by dr Benay Spice, no tx done, released by dr Benay Spice  . Complication of anesthesia    cannot lie on right side gets vertigo and trouble turning neck to right  . COPD (chronic obstructive pulmonary disease) ([place]) may 2005  . Erectile dysfunction   . Fatigue   . GERD (gastroesophageal reflux disease)   . Glaucoma   . Hepatitis as child   serum hepatitis  . History of oxygen administration    oxygen  '@2'$   l/m nasally at bedtime.  . Hyperlipidemia   . Hypertension   . Impaired hearing    wears Hearing aids  . Ischemic cardiomyopathy   . Leg pain   . Lumbar disc disease   . Microscopic hematuria   . Myocardial infarct 03-17-2009   Hx MI 2000  . Neuropathy (HCC)    both feet and legs  . Osteopenia   . Pneumonia    hx x2  . Thyroid nodule     Past Surgical History:  Procedure Laterality Date  . APPENDECTOMY  sept, 10, 2005  . BACK SURGERY  2009   L 2 and L 3   . benign cyst removed from left  neck  02-2010  . CERVICAL DISC SURGERY  2005-2008   C 2, C 3 and C 4 2005, rods placed C 6, C 7 and T 1  . COLONOSCOPY WITH PROPOFOL N/A 11/26/2014   Procedure: COLONOSCOPY WITH PROPOFOL;  Surgeon: Garlan Fair, MD;  [facility];  Service: Endoscopy;  Laterality: N/A;  . CORONARY ANGIOPLASTY WITH STENT PLACEMENT  2013   1 stent repair, one stent replaced  . ESOPHAGOGASTRODUODENOSCOPY (EGD) WITH PROPOFOL N/A 11/27/2013   Procedure: ESOPHAGOGASTRODUODENOSCOPY (EGD) WITH PROPOFOL;  Surgeon: Garlan Fair, MD;  [facility];  Service: Endoscopy;  Laterality: N/A;  . EYE SURGERY Bilateral july 2011   eye surgry for glaucoma  . FUDUCIAL PLACEMENT N/A 05/12/2016   Procedure: PLACEMENT OF FUDUCIAL;  Surgeon: Melrose Nakayama, MD;  Location: [place] [facility];  Service: Thoracic;  Laterality: N/A;  . fusion L 3 and L 4  August 15, 2008  . LEFT HEART CATHETERIZATION WITH CORONARY ANGIOGRAM N/A 08/12/2011   Procedure: LEFT HEART CATHETERIZATION WITH CORONARY ANGIOGRAM;  Surgeon: Larey Dresser, MD;  Location: [facility] CATH LAB;  Service: Cardiovascular;  Laterality: N/A;  . NASAL SINUS  SURGERY   oct 2005  . right knee arthroscopy  1981  . stent top heart  2011   x 2  . TONSILLECTOMY  1943  . VIDEO BRONCHOSCOPY WITH ENDOBRONCHIAL NAVIGATION N/A 05/12/2016   Procedure: VIDEO BRONCHOSCOPY WITH ENDOBRONCHIAL NAVIGATION;  Surgeon: Melrose Nakayama, MD;  Location: [facility] [place];  Service: Thoracic;  Laterality: N/A;    There were no vitals filed for this visit.       Subjective Assessment - 05/20/16 2018    Subjective Pt. expresses a belief in god and in the idea that he will die when (and only when) it is his time for him to die. Reports neuropathy in his feet and vertigo if he lies on his right side. Reports he has a treadmill and a stationary bike-type apparatus at home.   Patient is accompained by: Family member  wife and daughter   Pertinent History Pt. presented with right side chest pain.   Workup showed bilateral pulmonary nodules. Scans showed left upper lobe minute malignant nests of carcinoma consistent with adenocarcinoma.  He is expected to have XRT (SBRT) for this.  Ex-smoker who quit 2018 (100 pack-years). AAA, MI x 2; CLL with no treatment needed; ischemic cardiomopathy; COPD. 1990 C-spine fracture; 2005-2008 cervical disc surgery with rods placed.2009 back surgery at L2-3; right knee arthroscopy.  Uses hearing aids. Has had 4 back surgeries and 2 neck surgeries in recent years. Pt. reports neuroopathy in his feet and vertigo is he sleeps on his right side.    Limitations --   Patient Stated Goals get into from all lung clinic providers   Currently in Pain? No/denies                               PT Education - 05/20/16 2117    Education provided Yes   Education Details Posture, breathing, walking, [employer] article on staying active through treatment, lymphedema and PT info   Person(s) Educated Child(ren);Patient;Spouse   Methods Explanation;Handout;Demonstration   Comprehension Verbalized understanding;Returned demonstration               Lung Clinic Goals - 05/20/16 2130      Patient will be able to verbalize understanding of the benefit of exercise to decrease fatigue.   Status Achieved     Patient will be able to verbalize the importance of posture.   Status Achieved     Patient will be able to demonstrate diaphragmatic breathing for improved lung function.   Status Achieved     Patient will be able to verbalize understanding of the role of physical therapy to prevent functional decline and who to contact if physical therapy is needed.   Status Achieved             Plan - 05/20/16 2108    Clinical Impression Statement This is a pleasant 81 year-old gentleman with newer diagnosis of left upper lobe lung malignancy.  He expects to have XRT (SBRT).    Rehab Potential Good   PT Frequency One time visit   PT  Treatment/Interventions Patient/family education   PT Next Visit Plan None at this time.   PT Home Exercise Plan walking, breathing exericse   Recommended Other Services none   Consulted and Agree with Plan of Care Patient;Family member/caregiver      Patient will benefit from skilled therapeutic intervention in order to improve the following deficits and impairments:  Cardiopulmonary status limiting activity  Visit Diagnosis: Abnormal posture - Plan: PT plan of care cert/re-cert  Other symptoms and signs involving the musculoskeletal system - Plan: PT plan of care cert/re-cert      G-Codes - 57/32/20 Jun 19, 2124    Functional Assessment Tool Used (Outpatient Only) clinical judgement   Functional Limitation Changing and maintaining body position   Changing and Maintaining Body Position Current Status (U5427) At least 1 percent but less than 20 percent impaired, limited or restricted   Changing and Maintaining Body Position Goal Status (C6237) At least 1 percent but less than 20 percent impaired, limited or restricted   Changing and Maintaining Body Position Discharge Status (S2831) At least 1 percent but less than 20 percent impaired, limited or restricted       Problem List Patient Active Problem List   Diagnosis Date Noted  . Neoplasm of uncertain behavior of left upper lobe of lung 04/05/2016  . Carotid stenosis 10/19/2011  . Obstructive sleep apnea 09/13/2011  . ACS (acute coronary syndrome) ([place]) 08/13/2011  . COPD (chronic obstructive pulmonary disease) ([place]) 08/11/2011  . Ischemic cardiomyopathy 05/04/2011  . AAA (abdominal aortic aneurysm) ([place]) 05/04/2011  . CAD (coronary artery disease) 11/04/2010  . Hyperlipidemia 11/04/2010  . Leg pain 11/04/2010    Makinna Andy 05/21/2016, 12:28 PM  [facility], [place], [address] Phone: [phone]   Fax:  [phone]  Name: URAL ACREE MRN:  [id number] Date of Birth: 1935/07/03  Serafina Royals, PT 05/21/16 12:29 PM

## 2016-05-26 ENCOUNTER — Ambulatory Visit
Admission: RE | Admit: 2016-05-26 | Discharge: 2016-05-26 | Disposition: A | Discharge status: Home / Self Care | Payer: Medicare Other | Source: Ambulatory Visit | Attending: Radiation Oncology | Admitting: Radiation Oncology

## 2016-05-26 DIAGNOSIS — D381 Neoplasm of uncertain behavior of trachea, bronchus and lung: Secondary | ICD-10-CM

## 2016-05-26 DIAGNOSIS — Z510 Encounter for antineoplastic radiation therapy: Secondary | ICD-10-CM | POA: Diagnosis present

## 2016-05-26 DIAGNOSIS — C3412 Malignant neoplasm of upper lobe, left bronchus or lung: Secondary | ICD-10-CM | POA: Insufficient documentation

## 2016-06-14 DIAGNOSIS — Z510 Encounter for antineoplastic radiation therapy: Secondary | ICD-10-CM | POA: Diagnosis not present

## 2016-06-22 ENCOUNTER — Ambulatory Visit: Admission: RE | Admit: 2016-06-22 | Payer: Medicare Other | Source: Ambulatory Visit | Admitting: Radiation Oncology

## 2016-06-22 ENCOUNTER — Ambulatory Visit
Admission: RE | Admit: 2016-06-22 | Discharge: 2016-06-22 | Disposition: A | Discharge status: Home / Self Care | Payer: Medicare Other | Source: Ambulatory Visit | Attending: Radiation Oncology | Admitting: Radiation Oncology

## 2016-06-22 DIAGNOSIS — Z510 Encounter for antineoplastic radiation therapy: Secondary | ICD-10-CM | POA: Diagnosis not present

## 2016-06-22 DIAGNOSIS — D381 Neoplasm of uncertain behavior of trachea, bronchus and lung: Secondary | ICD-10-CM

## 2016-06-22 LAB — BUN AND CREATININE (CC13)
BUN: 11.8 mg/dL (ref 7.0–26.0)
Creatinine: 1 mg/dL (ref 0.7–1.3)
EGFR: 68 mL/min/{1.73_m2} — ABNORMAL LOW (ref >90)

## 2016-06-22 NOTE — Progress Notes (Signed)
  Radiation Oncology         [phone] ________________________________  Name: [name] MRN: [id number]  Date: 06/22/2016  DOB: November 15, 1935  Stereotactic Body Radiotherapy Treatment Procedure Note  NARRATIVE:  [name] was brought to the stereotactic radiation treatment machine and placed supine on the CT couch. The patient was set up for stereotactic body radiotherapy on the body fix pillow.  3D TREATMENT PLANNING AND DOSIMETRY:  The patient's radiation plan was reviewed and approved prior to starting treatment.  It showed 3-dimensional radiation distributions overlaid onto the planning CT.  The [facility] for the target structures as well as the organs at risk were reviewed. The documentation of this is filed in the radiation oncology EMR.  SIMULATION VERIFICATION:  The patient underwent CT imaging on the treatment unit.  These were carefully aligned to document that the ablative radiation dose would cover the target volume and maximally spare the nearby organs at risk according to the planned distribution.  SPECIAL TREATMENT PROCEDURE: [name] received high dose ablative stereotactic body radiotherapy to the planned target volume without unforeseen complications. Treatment was delivered uneventfully. The high doses associated with stereotactic body radiotherapy and the significant potential risks require careful treatment set up and patient monitoring constituting a special treatment procedure   STEREOTACTIC TREATMENT MANAGEMENT:  Following delivery, the patient was evaluated clinically. The patient tolerated treatment without significant acute effects, and was discharged to home in stable condition.    PLAN: Continue treatment as planned.  ________________________________  Blair Promise, PhD, MD   This document serves as a record of services personally performed by Gery Pray, MD. It was created on his behalf by Darcus Austin, a trained medical scribe. The creation of this  record is based on the scribe's personal observations and the provider's statements to them. This document has been checked and approved by the attending provider.

## 2016-06-23 ENCOUNTER — Ambulatory Visit: Payer: Medicare Other

## 2016-06-24 ENCOUNTER — Ambulatory Visit
Admission: RE | Admit: 2016-06-24 | Discharge: 2016-06-24 | Disposition: A | Discharge status: Home / Self Care | Payer: Medicare Other | Source: Ambulatory Visit | Attending: Radiation Oncology | Admitting: Radiation Oncology

## 2016-06-24 DIAGNOSIS — D381 Neoplasm of uncertain behavior of trachea, bronchus and lung: Secondary | ICD-10-CM

## 2016-06-24 DIAGNOSIS — Z510 Encounter for antineoplastic radiation therapy: Secondary | ICD-10-CM | POA: Diagnosis not present

## 2016-06-24 NOTE — Progress Notes (Signed)
  Radiation Oncology         [phone] ________________________________  Name: [name] MRN: [id number]  Date: 06/24/2016  DOB: [birth year]  Stereotactic Body Radiotherapy Treatment Procedure Note  NARRATIVE:  [name] was brought to the stereotactic radiation treatment machine and placed supine on the CT couch. The patient was set up for stereotactic body radiotherapy on the body fix pillow.  3D TREATMENT PLANNING AND DOSIMETRY:  The patient's radiation plan was reviewed and approved prior to starting treatment.  It showed 3-dimensional radiation distributions overlaid onto the planning CT.  [facility] for the target structures as well as the organs at risk were reviewed. The documentation of this is filed in the radiation oncology EMR.  SIMULATION VERIFICATION:  The patient underwent CT imaging on the treatment unit.  These were carefully aligned to document that the ablative radiation dose would cover the target volume and maximally spare the nearby organs at risk according to the planned distribution.  SPECIAL TREATMENT PROCEDURE: [name] received high dose ablative stereotactic body radiotherapy to the planned target volume without unforeseen complications. Treatment was delivered uneventfully. The high doses associated with stereotactic body radiotherapy and the significant potential risks require careful treatment set up and patient monitoring constituting a special treatment procedure   STEREOTACTIC TREATMENT MANAGEMENT:  Following delivery, the patient was evaluated clinically. The patient tolerated treatment without significant acute effects, and was discharged to home in stable condition.    PLAN: Continue treatment as planned.  ________________________________  Blair Promise, PhD, MD  This document serves as a record of services personally performed by Gery Pray, MD. It was created on his behalf by Maryla Morrow, a trained medical scribe. The creation of  this record is based on the scribe's personal observations and the provider's statements to them. This document has been checked and approved by the attending provider.

## 2016-06-25 ENCOUNTER — Ambulatory Visit: Payer: Medicare Other

## 2016-06-28 ENCOUNTER — Ambulatory Visit
Admission: RE | Admit: 2016-06-28 | Discharge: 2016-06-28 | Disposition: A | Discharge status: Home / Self Care | Payer: Medicare Other | Source: Ambulatory Visit | Attending: Radiation Oncology | Admitting: Radiation Oncology

## 2016-06-28 DIAGNOSIS — D381 Neoplasm of uncertain behavior of trachea, bronchus and lung: Secondary | ICD-10-CM

## 2016-06-28 DIAGNOSIS — Z510 Encounter for antineoplastic radiation therapy: Secondary | ICD-10-CM | POA: Diagnosis not present

## 2016-06-28 NOTE — Progress Notes (Signed)
  Radiation Oncology         [phone] ________________________________  Name: [name] MRN: [id number]  Date: 06/28/2016  DOB: 02/25/1936  Stereotactic Body Radiotherapy Treatment Procedure Note  NARRATIVE:  [name] was brought to the stereotactic radiation treatment machine and placed supine on the CT couch. The patient was set up for stereotactic body radiotherapy on the body fix pillow.  3D TREATMENT PLANNING AND DOSIMETRY:  The patient's radiation plan was reviewed and approved prior to starting treatment.  It showed 3-dimensional radiation distributions overlaid onto the planning CT.  [facility] for the target structures as well as the organs at risk were reviewed. The documentation of this is filed in the radiation oncology EMR.  SIMULATION VERIFICATION:  The patient underwent CT imaging on the treatment unit.  These were carefully aligned to document that the ablative radiation dose would cover the target volume and maximally spare the nearby organs at risk according to the planned distribution.  SPECIAL TREATMENT PROCEDURE: [name] received high dose ablative stereotactic body radiotherapy to the planned target volume without unforeseen complications. Treatment was delivered uneventfully. The high doses associated with stereotactic body radiotherapy and the significant potential risks require careful treatment set up and patient monitoring constituting a special treatment procedure   STEREOTACTIC TREATMENT MANAGEMENT:  Following delivery, the patient was evaluated clinically. The patient tolerated treatment without significant acute effects, and was discharged to home in stable condition.    PLAN: Continue treatment as planned.  ________________________________  Blair Promise, PhD, MD  This document serves as a record of services personally performed by Gery Pray, MD. It was created on his behalf by Bethann Humble, a trained medical scribe. The creation of this  record is based on the scribe's personal observations and the provider's statements to them. This document has been checked and approved by the attending provider.

## 2016-06-30 ENCOUNTER — Ambulatory Visit: Payer: Medicare Other | Admitting: Radiation Oncology

## 2016-06-30 ENCOUNTER — Encounter: Payer: Self-pay | Admitting: Radiation Oncology

## 2016-06-30 NOTE — Progress Notes (Signed)
  Radiation Oncology         [phone] ________________________________  Name: [name] MRN: [id number]  Date: 06/30/2016  DOB: [birth year]  End of Treatment Note  Diagnosis:  Clinical stage I non small cell lung cancer presenting in the left upper lobe     Indication for treatment:  Curative       Radiation treatment dates:  06/22/16-06/28/16  Site/dose:  Left upper lobe of lung/ 54 Gy in 3 fractions  Beams/energy:  SBRT SRT-VMAT/ 6FFF  Narrative: The patient tolerated radiation treatment relatively well.    Plan: The patient has completed radiation treatment. The patient will return to radiation oncology clinic for routine followup in one month. I advised them to call or return sooner if they have any questions or concerns related to their recovery or treatment.  -----------------------------------  Blair Promise, PhD, MD  This document serves as a record of services personally performed by Gery Pray, MD. It was created on his behalf by Bethann Humble, a trained medical scribe. The creation of this record is based on the scribe's personal observations and the provider's statements to them. This document has been checked and approved by the attending provider.

## 2016-07-02 ENCOUNTER — Ambulatory Visit: Payer: Medicare Other | Admitting: Radiation Oncology

## 2016-07-06 ENCOUNTER — Other Ambulatory Visit: Payer: Self-pay | Admitting: Radiation Oncology

## 2016-07-06 ENCOUNTER — Telehealth: Payer: Self-pay | Admitting: *Deleted

## 2016-07-06 DIAGNOSIS — D381 Neoplasm of uncertain behavior of trachea, bronchus and lung: Secondary | ICD-10-CM

## 2016-07-06 NOTE — Telephone Encounter (Signed)
CALLED PATIENT TO INFORM OF MRI FOR 07-07-16- ARRIVAL TIME - 8:45 PM @ [facility], SPOKE WITH PATIENT'S WIFE, PHYLLIS AND SHE IS AWARE OF THIS TEST

## 2016-07-07 ENCOUNTER — Encounter (INDEPENDENT_AMBULATORY_CARE_PROVIDER_SITE_OTHER): Payer: Self-pay

## 2016-07-07 ENCOUNTER — Ambulatory Visit (HOSPITAL_COMMUNITY)
Admission: RE | Admit: 2016-07-07 | Discharge: 2016-07-07 | Disposition: A | Discharge status: Home / Self Care | Payer: Medicare Other | Source: Ambulatory Visit | Attending: Radiation Oncology | Admitting: Radiation Oncology

## 2016-07-07 DIAGNOSIS — D381 Neoplasm of uncertain behavior of trachea, bronchus and lung: Secondary | ICD-10-CM | POA: Insufficient documentation

## 2016-07-07 MED ORDER — GADOBENATE DIMEGLUMINE 529 MG/ML IV SOLN
20.0000 mL | Freq: Once | INTRAVENOUS | Status: AC | PRN
  Administered 2016-07-07: 20 mL via INTRAVENOUS

## 2016-07-26 ENCOUNTER — Telehealth: Payer: Self-pay | Admitting: *Deleted

## 2016-07-26 NOTE — Telephone Encounter (Signed)
Oncology Nurse Navigator Documentation  Oncology Nurse Navigator Flowsheets 07/26/2016  Navigator Location [facility]  Navigator Encounter Type Telephone/I called to follow up with [name].  I was unable to reach him.  I left vm message with my name and phone number to call if needed   Telephone Outgoing Call  Treatment Phase Other  Interventions None required  Acuity Level 1  Time Spent with Patient 15

## 2016-07-30 ENCOUNTER — Encounter: Payer: Self-pay | Admitting: Oncology

## 2016-08-02 ENCOUNTER — Ambulatory Visit
Admission: RE | Admit: 2016-08-02 | Discharge: 2016-08-02 | Disposition: A | Discharge status: Home / Self Care | Payer: Medicare Other | Source: Ambulatory Visit | Attending: Radiation Oncology | Admitting: Radiation Oncology

## 2016-08-02 DIAGNOSIS — Z881 Allergy status to other antibiotic agents status: Secondary | ICD-10-CM | POA: Diagnosis not present

## 2016-08-02 DIAGNOSIS — C3412 Malignant neoplasm of upper lobe, left bronchus or lung: Secondary | ICD-10-CM | POA: Insufficient documentation

## 2016-08-02 DIAGNOSIS — D381 Neoplasm of uncertain behavior of trachea, bronchus and lung: Secondary | ICD-10-CM

## 2016-08-02 DIAGNOSIS — Z880 Allergy status to penicillin: Secondary | ICD-10-CM | POA: Insufficient documentation

## 2016-08-02 NOTE — Progress Notes (Signed)
[name] is here for follow up after treatment to his left upper lung.  He continues to have pain/cramping on and off in both lower sides of his rib cage.  He saw Dr. Stefan Church last Friday who increased his gabapentin to see if nerve damage is causing the pain.  He reports his shortness of breath is a little better.  He reports having an occasional cough with clear, "jello-like" sputum.  He denies having fatigue.  BP 131/86 (BP Location: Right Arm, Patient Position: Sitting)   Pulse 73   Temp 98.9 F (37.2 C) (Oral)   Ht '5\' 11"'$  (1.803 m)   Wt 215 lb 9.6 oz (97.8 kg)   SpO2 97%   BMI 30.07 kg/m    Wt Readings from Last 3 Encounters:  08/02/16 215 lb 9.6 oz (97.8 kg)  05/26/16 207 lb 12.8 oz (94.3 kg)  05/20/16 208 lb 11.2 oz (94.7 kg)

## 2016-08-02 NOTE — Progress Notes (Signed)
Radiation Oncology         [phone] ________________________________  Name: [name] MRN: [id number]  Date: 08/02/2016  DOB: 1935-08-22  Follow-Up Visit Note  CC: Wenda Low, MD  Wenda Low, MD    ICD-9-CM ICD-10-CM   1. Neoplasm of uncertain behavior of left upper lobe of lung 235.7 D38.1     Diagnosis:  Clinical stage I non small cell lung cancer presenting in the left upper lobe  Interval Since Last Radiation: 1 month  04/10.18--06/28/16: 54 Gy to the left upper lung in 3 fractions  Narrative:  The patient returns today for routine follow-up. He complains of occasional continues pain/cramping on bilateral lower sides of ribcage. He was seen by Dr. Stefan Church last Friday who increased his Gabapentin. He reports shortness of breath is improving. He reports an occasional productive cough with clear sputum. He reports neuropathy in the feet. He denies fatigue.                             ALLERGIES:  is allergic to entresto [sacubitril-valsartan]; iodinated diagnostic agents; penicillins; lumigan [bimatoprost]; lyrica [pregabalin]; sulfonamide derivatives; symbicort [budesonide-formoterol fumarate]; valsartan; advair diskus [fluticasone-salmeterol]; bacitracin; brimonidine tartrate; ciprofloxacin; flovent diskus [fluticasone propionate (inhal)]; pulmicort [budesonide]; qvar [beclomethasone]; spiriva [tiotropium bromide monohydrate]; tessalon [benzonatate]; adhesive [tape]; flonase [fluticasone propionate]; and relafen [nabumetone].  Meds: Current Outpatient Prescriptions  Medication Sig Dispense Refill  . albuterol (PROAIR HFA) 108 (90 BASE) MCG/ACT inhaler Inhale 2 puffs into the lungs every 6 (six) hours as needed for wheezing or shortness of breath.     Marland Kitchen albuterol (PROVENTIL) (2.5 MG/3ML) 0.083% nebulizer solution Take 2.5 mg by nebulization every 6 (six) hours as needed for wheezing or shortness of breath.    Marland Kitchen alendronate (FOSAMAX) 70 MG tablet Take 70 mg by mouth once  a week. Take with a full glass of water on an empty stomach.  Saturdays    . aspirin 81 MG tablet Take 81 mg by mouth daily.     Marland Kitchen CALCIUM-MAGNESIUM-VITAMIN D PO Take 1 tablet by mouth daily.    Marland Kitchen gabapentin (NEURONTIN) 300 MG capsule Take 600 mg by mouth 2 (two) times daily.     Marland Kitchen guaiFENesin (MUCINEX) 600 MG 12 hr tablet Take 600 mg by mouth 2 (two) times daily.    . hydrocortisone 1 % lotion Apply 1 application topically daily as needed for itching.    . latanoprost (XALATAN) 0.005 % ophthalmic solution Place 1 drop into both eyes at bedtime.     . metoprolol succinate (TOPROL-XL) 25 MG 24 hr tablet Take 25 mg by mouth every morning.     . nitroGLYCERIN (NITROSTAT) 0.4 MG SL tablet Place 0.4 mg under the tongue every 5 (five) minutes as needed for chest pain. For chest pain     . rosuvastatin (CRESTOR) 20 MG tablet Take 20 mg by mouth daily.    . Tamsulosin HCl (FLOMAX) 0.4 MG CAPS Take 0.4 mg by mouth every evening.     . timolol (TIMOPTIC) 0.5 % ophthalmic solution Place 1 drop into both eyes 2 (two) times daily.     No current facility-administered medications for this encounter.     Physical Findings: The patient is in no acute distress. Patient is alert and oriented.  height is '5\' 11"'$  (1.803 m) and weight is 215 lb 9.6 oz (97.8 kg). His oral temperature is 98.9 F (37.2 C). His blood pressure is 131/86 and his pulse  is 73. His oxygen saturation is 97%. .  No significant changes. Lungs are clear to auscultation bilaterally. Heart has regular rate and rhythm. No palpable cervical, supraclavicular, or axillary adenopathy. Abdomen soft, non-tender, normal bowel sounds.   Lab Findings: Lab Results  Component Value Date   WBC 9.5 05/07/2016   HGB 14.3 05/07/2016   HCT 43.0 05/07/2016   MCV 94.5 05/07/2016   PLT 159 05/07/2016    Radiographic Findings: Mr Jeri Cos AJ Contrast  Result Date: 07/07/2016 CLINICAL DATA:  Headache and history of lung cancer. EXAM: MRI HEAD WITHOUT AND  WITH CONTRAST TECHNIQUE: Multiplanar, multiecho pulse sequences of the brain and surrounding structures were obtained without and with intravenous contrast. CONTRAST:  68m MULTIHANCE GADOBENATE DIMEGLUMINE 529 MG/ML IV SOLN COMPARISON:  Brain MRI 11/08/2014 FINDINGS: Brain: The midline structures are normal. No focal diffusion restriction to indicate acute infarct. No intraparenchymal hemorrhage. There is multifocal hyperintense T2-weighted signal within the periventricular white matter, most often seen in the setting of chronic microvascular ischemia. No mass lesion. No chronic microhemorrhage or cerebral amyloid angiopathy. No hydrocephalus, age advanced atrophy or lobar predominant volume loss. No dural abnormality or extra-axial collection. Vascular: Major intracranial arterial and venous sinus flow voids are preserved. Skull and upper cervical spine: The visualized skull base, calvarium, upper cervical spine and extracranial soft tissues are normal. Sinuses/Orbits: No fluid levels or advanced mucosal thickening. No mastoid effusion. Normal orbits. IMPRESSION: Chronic microvascular ischemia without intracranial metastatic disease or other acute abnormality. Electronically Signed   By: KUlyses JarredM.D.   On: 07/07/2016 22:32    Impression: The patient is clinically stable after SBRT.  Plan:  The patient will follow-up with radiation oncology in 2 months and he will be scheduled for a chest CT soon afterwards.  ____________________________________   This document serves as a record of services personally performed by JGery Pray MD. It was created on his behalf by LBethann Humble a trained medical scribe. The creation of this record is based on the scribe's personal observations and the provider's statements to them. This document has been checked and approved by the attending provider.

## 2016-08-17 ENCOUNTER — Telehealth: Payer: Self-pay | Admitting: *Deleted

## 2016-08-17 NOTE — Telephone Encounter (Signed)
CALLED PATIENT TO INFORM OF FU ON 09-13-16 @ 11:30 AM WITH DR. KINARD, LVM FOR A RETURN CALL

## 2016-09-20 ENCOUNTER — Telehealth: Payer: Self-pay | Admitting: Oncology

## 2016-09-20 NOTE — Telephone Encounter (Signed)
Called patient back and advised him that Dr. Sondra Come does not think the skin irritation is from radiation but would be happy to see him for follow up.  Patient said that he thinks it was from the cream used for his carotid artery scan and is going to give them a call.  Advised him to call back if needed.  He verbalized understanding and agreement.

## 2016-09-20 NOTE — Telephone Encounter (Signed)
Patient called and said he feels like he has a sunburn between his shoulder blades.  He said it started on Wednesday and he just wanted to make sure it was not from radiation.  He also said he had a carotid artery scan a few days before and is not sure if the pillow from the scan irritated his skin.  Advised him that I did not think it would be from radiation but that I will check with Dr. Sondra Come and call him back.

## 2016-10-04 ENCOUNTER — Encounter: Payer: Self-pay | Admitting: Radiation Oncology

## 2016-10-04 ENCOUNTER — Ambulatory Visit
Admission: RE | Admit: 2016-10-04 | Discharge: 2016-10-04 | Disposition: A | Discharge status: Home / Self Care | Payer: Medicare Other | Source: Ambulatory Visit | Attending: Radiation Oncology | Admitting: Radiation Oncology

## 2016-10-04 DIAGNOSIS — C3412 Malignant neoplasm of upper lobe, left bronchus or lung: Secondary | ICD-10-CM | POA: Diagnosis not present

## 2016-10-04 DIAGNOSIS — D381 Neoplasm of uncertain behavior of trachea, bronchus and lung: Secondary | ICD-10-CM

## 2016-10-04 NOTE — Progress Notes (Signed)
Radiation Oncology         [phone] ________________________________  Name: [name] MRN: [id number]  Date: 10/04/2016  DOB: 09/08/1935  Follow-Up Visit Note  CC: Wenda Low, MD  Melrose Nakayama, *    ICD-10-CM   1. Neoplasm of uncertain behavior of left upper lobe of lung D38.1     Diagnosis:  Clinical stage I non small cell lung cancer presenting in the left upper lobe  Interval Since Last Radiation: 3 months, 7 days  04/10.18--06/28/16: 54 Gy to the left upper lung in 3 fractions  Narrative:  The patient returns today for routine follow-up of his left lung. He reports having tingling that feels like a sunburn on both under arm areas extending across his chest.  He said it started between his shoulder blades about 5 weeks ago.  He denies having any skin redness.  He reports having shortness of breath with any activity.  He reports having an occasional cough with clear sputum which has a "gelatin" consistency.  He denies having any hemoptysis.  He denies having fatigue. The patient presents today with this wife.                       ALLERGIES:  is allergic to entresto [sacubitril-valsartan]; iodinated diagnostic agents; penicillins; lumigan [bimatoprost]; lyrica [pregabalin]; sulfonamide derivatives; symbicort [budesonide-formoterol fumarate]; valsartan; advair diskus [fluticasone-salmeterol]; bacitracin; brimonidine tartrate; ciprofloxacin; flovent diskus [fluticasone propionate (inhal)]; pulmicort [budesonide]; qvar [beclomethasone]; spiriva [tiotropium bromide monohydrate]; tessalon [benzonatate]; adhesive [tape]; flonase [fluticasone propionate]; and relafen [nabumetone].  Meds: Current Outpatient Prescriptions  Medication Sig Dispense Refill  . albuterol (PROAIR HFA) 108 (90 BASE) MCG/ACT inhaler Inhale 2 puffs into the lungs every 6 (six) hours as needed for wheezing or shortness of breath.     Marland Kitchen albuterol (PROVENTIL) (2.5 MG/3ML) 0.083% nebulizer solution  Take 2.5 mg by nebulization every 6 (six) hours as needed for wheezing or shortness of breath.    Marland Kitchen aspirin 81 MG tablet Take 81 mg by mouth daily.     Marland Kitchen CALCIUM-MAGNESIUM-VITAMIN D PO Take 1 tablet by mouth daily.    Marland Kitchen gabapentin (NEURONTIN) 300 MG capsule Take 600 mg by mouth 2 (two) times daily.     . hydrocortisone 1 % lotion Apply 1 application topically daily as needed for itching.    . latanoprost (XALATAN) 0.005 % ophthalmic solution Place 1 drop into both eyes at bedtime.     . metoprolol succinate (TOPROL-XL) 25 MG 24 hr tablet Take 25 mg by mouth every morning.     . nitroGLYCERIN (NITROSTAT) 0.4 MG SL tablet Place 0.4 mg under the tongue every 5 (five) minutes as needed for chest pain. For chest pain     . rosuvastatin (CRESTOR) 20 MG tablet Take 20 mg by mouth daily.    . Tamsulosin HCl (FLOMAX) 0.4 MG CAPS Take 0.4 mg by mouth every evening.     . timolol (TIMOPTIC) 0.5 % ophthalmic solution Place 1 drop into both eyes 2 (two) times daily.    Marland Kitchen alendronate (FOSAMAX) 70 MG tablet Take 70 mg by mouth once a week. Take with a full glass of water on an empty stomach.  Saturdays    . guaiFENesin (MUCINEX) 600 MG 12 hr tablet Take 600 mg by mouth 2 (two) times daily.     No current facility-administered medications for this encounter.     Physical Findings: The patient is in no acute distress. Patient is alert and oriented.  height is 5\' 11"  (1.803 m) and weight is 211 lb 6.4 oz (95.9 kg). His oral temperature is 97.6 F (36.4 C). His blood pressure is 134/81 and his pulse is 70. His oxygen saturation is 98%. .   Lungs are clear to auscultation bilaterally. Heart has regular rate and rhythm. No palpable cervical, supraclavicular, or axillary adenopathy. Abdomen soft, non-tender, normal bowel sounds. Examination of the skin in the chest region reveals no skin reaction.   Lab Findings: Lab Results  Component Value Date   WBC 9.5 05/07/2016   HGB 14.3 05/07/2016   HCT 43.0  05/07/2016   MCV 94.5 05/07/2016   PLT 159 05/07/2016    Radiographic Findings: No results found.  Impression: Clinically stable.   Plan:  Patient will return in 3 months for a follow up and is scheduled for a CT scan to be performed in the next few days. ___________________________________  Blair Promise, PhD, MD This document serves as a record of services personally performed by Gery Pray, MD. It was created on his behalf by Valeta Harms, a trained medical scribe. The creation of this record is based on the scribe's personal observations and the provider's statements to them. This document has been checked and approved by the attending provider.

## 2016-10-04 NOTE — Progress Notes (Signed)
[name] is here for follow up after treatment to his left lung.  He reports having tingling that feels like a sunburn on both under arm areas extending across his chest.  He said it started between his shoulder blades about 5 weeks ago.  He denies having any skin redness.  He reports having shortness of breath with any activity.  He reports having an occasional cough with clear sputum which has a "gellatin" consistency.  He denies having any hemoptysis.  He denies having fatigue.  BP 134/81 (BP Location: Right Arm, Patient Position: Sitting)   Pulse 70   Temp 97.6 F (36.4 C) (Oral)   Ht 5\' 11"  (1.803 m)   Wt 211 lb 6.4 oz (95.9 kg)   SpO2 98%   BMI 29.48 kg/m    Wt Readings from Last 3 Encounters:  10/04/16 211 lb 6.4 oz (95.9 kg)  08/02/16 215 lb 9.6 oz (97.8 kg)  05/26/16 207 lb 12.8 oz (94.3 kg)

## 2016-10-13 ENCOUNTER — Ambulatory Visit (HOSPITAL_COMMUNITY)
Admission: RE | Admit: 2016-10-13 | Discharge: 2016-10-13 | Disposition: A | Discharge status: Home / Self Care | Payer: Medicare Other | Source: Ambulatory Visit | Attending: Radiation Oncology | Admitting: Radiation Oncology

## 2016-10-13 DIAGNOSIS — J9811 Atelectasis: Secondary | ICD-10-CM | POA: Diagnosis not present

## 2016-10-13 DIAGNOSIS — J432 Centrilobular emphysema: Secondary | ICD-10-CM | POA: Insufficient documentation

## 2016-10-13 DIAGNOSIS — I700 Atherosclerosis of aorta: Secondary | ICD-10-CM | POA: Insufficient documentation

## 2016-10-13 DIAGNOSIS — D381 Neoplasm of uncertain behavior of trachea, bronchus and lung: Secondary | ICD-10-CM | POA: Diagnosis present

## 2016-10-13 DIAGNOSIS — M47814 Spondylosis without myelopathy or radiculopathy, thoracic region: Secondary | ICD-10-CM | POA: Diagnosis not present

## 2016-10-13 DIAGNOSIS — E041 Nontoxic single thyroid nodule: Secondary | ICD-10-CM | POA: Insufficient documentation

## 2016-10-13 DIAGNOSIS — N281 Cyst of kidney, acquired: Secondary | ICD-10-CM | POA: Insufficient documentation

## 2016-10-13 DIAGNOSIS — R918 Other nonspecific abnormal finding of lung field: Secondary | ICD-10-CM | POA: Diagnosis not present

## 2016-10-14 ENCOUNTER — Telehealth: Payer: Self-pay | Admitting: Radiation Oncology

## 2016-10-14 NOTE — Telephone Encounter (Signed)
test

## 2016-10-15 ENCOUNTER — Telehealth: Payer: Self-pay | Admitting: Oncology

## 2016-10-15 NOTE — Telephone Encounter (Signed)
Called patient and advised him of the good CT scan results per Dr. Sondra Come.  He asked if the scan showed anything in his right chest because he is having pain in that area.  Advised him that there is an unchanged right lower lobe nodule.  He asked if this area would need treatment.  Advised him that Dr. Sondra Come will be asked on Monday and that we will call him back.

## 2016-10-18 ENCOUNTER — Telehealth: Payer: Self-pay | Admitting: Oncology

## 2016-10-18 NOTE — Telephone Encounter (Signed)
Called patient and advised [name] that he does not need more radiation treatment per Dr. Sondra Come for his right side pain.  Also reminded [name] of his next follow up on 01/10/17.  He verbalized understanding and agreement.

## 2016-11-01 ENCOUNTER — Telehealth: Payer: Self-pay | Admitting: *Deleted

## 2016-11-01 ENCOUNTER — Telehealth: Payer: Self-pay | Admitting: Internal Medicine

## 2016-11-01 NOTE — Telephone Encounter (Signed)
Called and spoke with Dr. Mila Palmer office and they are aware of consult that has been changed to 08/30 for the consult with MW>

## 2016-11-01 NOTE — Telephone Encounter (Signed)
-----   Message from Chesley Mires, MD sent at 11/01/2016  1:39 PM EDT ----- Received message from Dr. Einar Gip with cardiology asking this patient get schedule for consult with me.  He was told first available was October 2.  Can you get him scheduled sooner please.  Okay to double book visit.  Thanks.  V

## 2016-11-02 NOTE — Telephone Encounter (Signed)
Scheduled with MW 8/30 with Dr Melvyn Novas.  Per Dr Halford Chessman, keep this appt as scheduled.  Will call the patient to relay.

## 2016-11-03 NOTE — Telephone Encounter (Signed)
Patient's wife returned Ashtyn's call and was advised for patient to keep appointment with Dr. Melvyn Novas for 08/30.  She is aware and nothing further needed.

## 2016-11-11 ENCOUNTER — Encounter: Payer: Self-pay | Admitting: Internal Medicine

## 2016-11-11 ENCOUNTER — Ambulatory Visit (INDEPENDENT_AMBULATORY_CARE_PROVIDER_SITE_OTHER): Payer: Medicare Other | Admitting: Internal Medicine

## 2016-11-11 VITALS — BP 104/70 | HR 89 | Ht 71.0 in | Wt 210.0 lb

## 2016-11-11 DIAGNOSIS — C3492 Malignant neoplasm of unspecified part of left bronchus or lung: Secondary | ICD-10-CM

## 2016-11-11 DIAGNOSIS — J449 Chronic obstructive pulmonary disease, unspecified: Secondary | ICD-10-CM

## 2016-11-11 NOTE — Assessment & Plan Note (Addendum)
PFT 06/18/2011-moderate obstructive airways disease, mild restriction and severely reduced diffusion capacity consistent with emphysema. FEV1 1.88/60%, FEV1/FEC 0.59, insignificant response to bronchodilator. TLC 73%, DLCO 38%. 6 minute walk test 08/27/2011-91%, 93%, 97%, 426 m. No significant desaturation during this exercise. Complained of hip pain. - PFT's  04/26/16  FEV1 1.33 (47 % ) ratio 47  p 22 % improvement from saba p nothing  prior to study with DLCO  30 % corrects to 49  % for alv volume   - 11/11/2016  Walked RA x 3 laps @ 185 ft each stopped due to  End of study, moderately fast  pace, no sob or desat    - 11/11/2016  After extensive coaching HFA effectiveness =    90%     I reviewed the Fletcher curve with the patient that basically indicates  if you quit smoking when your best day FEV1 is still  preserved (as is still relatively true  here)  it is highly unlikely you will progress to endstage disease and informed the patient there was  no medication on the market that has proven to alter the curve/ its downward trajectory  or the likelihood of progression of their disease(unlike other chronic medical conditions such as atheroclerosis where we do think we can change the natural hx with risk reducing meds)    Therefore stopping smoking and maintaining abstinence is the most important aspect of care, not choice of inhalers or for that matter, doctors.    Treatment is directed at improving symptoms in pts like this who aren't prone to flares and would be a good candidate for a LABA  As not tolerated lama's in past for reason's not clear nor did he tolerate laba/ics -  I offered a trial but he is reluctant to try anything new and would rather work on pacing and trying the saba used before exertion first and then return for laba trial if not satisfied.  Total time devoted to counseling  > 50 % of initial 60 min office visit:  review case with pt/wife discussion of options/alternatives/ personally  creating written customized instructions  in presence of pt  then going over those specific  Instructions directly with the pt including how to use all of the meds but in particular covering each new medication in detail and the difference between the maintenance= "automatic" meds and the prns using an action plan format for the latter (If this problem/symptom => do that organization reading Left to right).  Please see AVS from this visit for a full list of these instructions which I personally wrote for this pt and  are unique to this visit.

## 2016-11-11 NOTE — Patient Instructions (Addendum)
Work on inhaler technique:  relax and gently blow all the way out then take a nice smooth deep breath back in, triggering the inhaler at same time you start breathing in.  Hold for up to 5 seconds if you can. Blow out thru nose. Rinse and gargle with water when done      Only use your albuterol as a rescue medication to be used if you can't catch your breath by resting or doing a relaxed purse lip breathing pattern and also ok to use it before exertion on a trial basis but if needing it regularly then consider LABA  - The less you use it, the better it will work when you need it. - Ok to use up to 2 puffs  every 4 hours if you must but call for immediate appointment if use goes up over your usual need - Don't leave home without it !!  (think of it like the spare tire for your car)   Goal for 02 is to keep it above 90% - pace yourself short of breath is ok, out of breath is not ok    If you are satisfied with your treatment plan,  let your doctor know and he/she can either refill your medications or you can return here when your prescription runs out.     If in any way you are not 100% satisfied,  please tell us.  If 100% better, tell your friends!  Pulmonary follow up is as needed

## 2016-11-11 NOTE — Progress Notes (Signed)
Subjective:     Patient ID: [name], male   DOB: 1935-06-02,    MRN: [id number]  HPI  74 yowm quit smoking 07/2006 eval by DR Annamaria Boots in 2013   08/06/11-  Dr Annamaria Boots eval:   Dr.Husain because of COPD.   He smoked 2 packs per day, stopping in 2008  Diagnosed with COPD in 2005. He can't tell if Advair or Proair help. He has a nebulizer with no medication. Spiriva caused rash, swelling and itching. He had intolerance to Symbicort but doesn't remember details.  Hip pain limits his walking before shortness of breath does. He notices shortness of breath with activity but little cough and no phlegm or wheezing. He does not use oxygen. PFT 06/18/2011-moderate obstructive airways disease with slight response to bronchodilator, mild restriction, severe reduction in diffusion capacity. FEV1 1.88/60%, FEV1/FVC 0.59. Insignificant response to bronchodilator.  Cards eval Jan9 2018 echo ef 38-45% with borderline RVE  11/11/2016 1st  Pulmonary office visit/ [name]   Chief Complaint  Patient presents with  . Pulmonary Consult    Referred by Dr. Einar Gip. Pt states he was dxed with COPD in 2005. He has had PNA in the past. He states he gets winded walking 100 ft at a brisk pace.   chronic doe x years but breathing went from bad to worse about the same time he was dx with [place] lung ca LUL  05/12/2016  rx only with RT x3  But no further decline since completed RT  Walks 200 ft to daughter's house and has stop (flat) / pace is only slightly slower than prior  Never tries to walk after inhaler but technique at baseline very poor  No cough / fine at hs on 02   No obvious day to day or daytime variability or assoc excess/ purulent sputum or mucus plugs or hemoptysis or cp or chest tightness, subjective wheeze or overt sinus or hb symptoms. No unusual exp hx or h/o childhood pna/ asthma or knowledge of premature birth.  Sleeping ok without nocturnal  or early am exacerbation  of respiratory  c/o's or need for  noct saba. Also denies any obvious fluctuation of symptoms with weather or environmental changes or other aggravating or alleviating factors except as outlined above   Current Medications, Allergies, Complete Past Medical History, Past Surgical History, Family History, and Social History were reviewed in [employer] record.  ROS  The following are not active complaints unless bolded sore throat, dysphagia, dental problems, itching, sneezing,  nasal congestion or excess/ purulent secretions, ear ache,   fever, chills, sweats, unintended wt loss, classically pleuritic or exertional cp,  orthopnea pnd or leg swelling, presyncope, palpitations, abdominal pain, anorexia, nausea, vomiting, diarrhea  or change in bowel or bladder habits, change in stools or urine, dysuria,hematuria,  rash, arthralgias, visual complaints, headache, numbness, weakness or ataxia or problems with walking or coordination,  change in mood/affect or memory.            Review of Systems     Objective:   Physical Exam   Pleasant amb mildly  obese wm nad    Wt Readings from Last 3 Encounters:  11/11/16 210 lb (95.3 kg)  10/04/16 211 lb 6.4 oz (95.9 kg)  08/02/16 215 lb 9.6 oz (97.8 kg)    Vital signs reviewed - Note on arrival 02 sats  93% on RA     HEENT: nl dentition, turbinates bilaterally, and oropharynx. Nl external ear canals without cough reflex  NECK :  without JVD/Nodes/TM/ nl carotid upstrokes bilaterally   LUNGS: no acc muscle use,  Nl contour chest which is clear to A and P bilaterally without cough on insp or exp maneuvers   CV:  RRR  no s3 or murmur or increase in P2, and no edema   ABD: obese/ soft and nontender with nl inspiratory excursion in the supine position. No bruits or organomegaly appreciated, bowel sounds nl  MS:  Nl gait/ ext warm without deformities, calf tenderness, cyanosis or clubbing No obvious joint restrictions   SKIN: warm and dry without lesions     NEURO:  alert, approp, nl sensorium with  no motor or cerebellar deficits apparent.      I personally reviewed images and agree with radiology impression as follows:   Chest CT  10/13/16 Interval decrease in size of irregular nodule within the left upper lobe.      Assessment:

## 2016-11-12 NOTE — Assessment & Plan Note (Signed)
Noted on CXR, then on CT CHEST 04/05/16, also new pleural based RLLobe nodule - Diagnosis:  Clinical stage I non small cell lung cancer presenting in the left upper lobe  Interval Since Last Radiation: 1 month  04/10.18--06/28/16: 54 Gy to the left upper lung in 3 fractions Dr Sondra Come  Advised RT may have some residual affects on the  LUL but this does not constitute   a very large component of his lung function and his prognosis in this regards appears to be good at this point

## 2016-12-14 ENCOUNTER — Institutional Professional Consult (permissible substitution): Payer: Medicare Other | Admitting: Internal Medicine

## 2017-01-10 ENCOUNTER — Ambulatory Visit
Admission: RE | Admit: 2017-01-10 | Discharge: 2017-01-10 | Disposition: A | Discharge status: Home / Self Care | Payer: Medicare Other | Source: Ambulatory Visit | Attending: Radiation Oncology | Admitting: Radiation Oncology

## 2017-01-10 ENCOUNTER — Encounter: Payer: Self-pay | Admitting: Radiation Oncology

## 2017-01-10 DIAGNOSIS — Z881 Allergy status to other antibiotic agents status: Secondary | ICD-10-CM | POA: Diagnosis not present

## 2017-01-10 DIAGNOSIS — D381 Neoplasm of uncertain behavior of trachea, bronchus and lung: Secondary | ICD-10-CM | POA: Diagnosis present

## 2017-01-10 DIAGNOSIS — C3412 Malignant neoplasm of upper lobe, left bronchus or lung: Secondary | ICD-10-CM | POA: Diagnosis not present

## 2017-01-10 DIAGNOSIS — Z880 Allergy status to penicillin: Secondary | ICD-10-CM | POA: Diagnosis not present

## 2017-01-10 DIAGNOSIS — C3492 Malignant neoplasm of unspecified part of left bronchus or lung: Secondary | ICD-10-CM

## 2017-01-10 NOTE — Progress Notes (Signed)
[name] is here for follow up.  He reports having pain on and off in his right lower back that radiates to his left side.  He said the pain went away after radiation but came back about a month ago.  He denies having an increase in shortness of breath or a cough.  He continues to use oxygen at night.  He reports having a good energy level.  BP 123/78 (BP Location: Left Arm, Patient Position: Sitting)   Pulse 76   Temp 98.2 F (36.8 C) (Oral)   Ht 5\' 11"  (1.803 m)   Wt 210 lb 9.6 oz (95.5 kg)   SpO2 95%   BMI 29.37 kg/m    Wt Readings from Last 3 Encounters:  01/10/17 210 lb 9.6 oz (95.5 kg)  11/11/16 210 lb (95.3 kg)  10/04/16 211 lb 6.4 oz (95.9 kg)

## 2017-01-10 NOTE — Progress Notes (Signed)
Radiation Oncology         (336) [phone] ________________________________  Name: [name] MRN: [id number]  Date: 01/10/2017  DOB: [birth year]  Follow-Up Visit Note  CC: Wenda Low, MD  Melrose Nakayama, *    ICD-10-CM   1. Non-small cell carcinoma of left lung, stage 1 ([place]) C34.92     Diagnosis:   Clinical stage I non small cell lung cancer presenting in the left upper lobe  Interval Since Last Radiation:  6 months 2 weeks  Narrative:  The patient returns today for routine follow-up of the left lung. Of note since the patient last visit, pt has had a CT chest completed on 10/13/2016 with results revealing interval decrease in size of the left upper lobe nodule. A right thyroid lobe nodule was re-demonstrated as well. He states he saw Dr. Melvyn Novas about 6 weeks ago and was told everything was going well.   Pt presents to the office today accompanied by his wife. He reports that he is doing well overall. He does reports intermittent right lower left-sided back pain. He states this pain resolved shortly after radiation therapy but returned approximately one month ago. He has seen Dr. Lorenda Hatchet for this issue in the past and was told it was likely scar tissue. 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    On review of systems, he reports lower, left-sided back pain. He denies hemoptysis.                          ALLERGIES:  is allergic to entresto [sacubitril-valsartan]; iodinated diagnostic agents; penicillins; lumigan [bimatoprost]; lyrica [pregabalin]; sulfonamide derivatives; symbicort [budesonide-formoterol fumarate]; valsartan; advair diskus [fluticasone-salmeterol]; bacitracin; brimonidine tartrate; ciprofloxacin; flovent diskus [fluticasone propionate (inhal)]; pulmicort [budesonide]; qvar [beclomethasone]; spiriva [tiotropium bromide monohydrate]; tessalon [benzonatate]; adhesive [tape]; flonase [fluticasone propionate]; and  relafen [nabumetone].  Meds: Current Outpatient Prescriptions  Medication Sig Dispense Refill  . albuterol (PROAIR HFA) 108 (90 BASE) MCG/ACT inhaler Inhale 2 puffs into the lungs every 6 (six) hours as needed for wheezing or shortness of breath.     Marland Kitchen albuterol (PROVENTIL) (2.5 MG/3ML) 0.083% nebulizer solution Take 2.5 mg by nebulization every 6 (six) hours as needed for wheezing or shortness of breath.    Marland Kitchen aspirin 81 MG tablet Take 81 mg by mouth daily.     Marland Kitchen CALCIUM-MAGNESIUM-VITAMIN D PO Take 1 tablet by mouth daily.    . dorzolamide-timolol (COSOPT) 22.3-6.8 MG/ML ophthalmic solution Place 1 drop into both eyes 2 (two) times daily.    . DUREZOL 0.05 % EMUL     . gabapentin (NEURONTIN) 300 MG capsule Take 600 mg by mouth 2 (two) times daily.     Marland Kitchen latanoprost (XALATAN) 0.005 % ophthalmic solution Place 1 drop into both eyes at bedtime.     . metoprolol succinate (TOPROL-XL) 25 MG 24 hr tablet Take 25 mg by mouth every morning.     . nitroGLYCERIN (NITROSTAT) 0.4 MG SL tablet Place 0.4 mg under the tongue every 5 (five) minutes as needed for chest pain. For chest pain     . OXYGEN 2lpm with sleep only    . PROLENSA 0.07 % SOLN     . rosuvastatin (CRESTOR) 20 MG tablet Take 20 mg by mouth daily.    . Tamsulosin HCl (FLOMAX) 0.4 MG CAPS Take 0.4 mg by mouth every evening.     . tobramycin (TOBREX) 0.3 % ophthalmic  solution     . erythromycin ophthalmic ointment      No current facility-administered medications for this encounter.     Physical Findings: The patient is in no acute distress. Patient is alert and oriented.  height is 5\' 11"  (1.803 m) and weight is 210 lb 9.6 oz (95.5 kg). His oral temperature is 98.2 F (36.8 C). His blood pressure is 123/78 and his pulse is 76. His oxygen saturation is 95%. .    Lungs are clear to auscultation bilaterally. Heart has regular rate and rhythm. No palpable cervical, supraclavicular, or axillary adenopathy. Abdomen soft, non-tender, normal  bowel sounds.   Lab Findings: Lab Results  Component Value Date   WBC 9.5 05/07/2016   HGB 14.3 05/07/2016   HCT 43.0 05/07/2016   MCV 94.5 05/07/2016   PLT 159 05/07/2016    Radiographic Findings: No results found.  Impression:   Clinically stable. He does have recurrence of right lower back/chest pain. Will repeat chest CT to follow up with this issue and his lung cancer  Plan: Routine follow up in 3 months otherwise.  -----------------------------------  Blair Promise, PhD, MD   This document serves as a record of services personally performed by Gery Pray, MD. It was created on his behalf by Marlowe Kays, a trained medical scribe. The creation of this record is based on the scribe's personal observations and the provider's statements to them. This document has been checked and approved by the attending provider.

## 2017-01-11 ENCOUNTER — Telehealth: Payer: Self-pay | Admitting: *Deleted

## 2017-01-11 NOTE — Telephone Encounter (Signed)
CALLED PATIENT TO INFORM OF CT FOR 01-14-17- ARRIVAL TIME - 4:15 PM @ [facility], NO RESTRICTIONS TO TEST, TEST ORDERED WITHOUT CONTRAST, SPOKE WITH PATIENT AND HE IS AWARE OF THIS TEST

## 2017-01-14 ENCOUNTER — Encounter (HOSPITAL_COMMUNITY): Payer: Self-pay

## 2017-01-14 ENCOUNTER — Ambulatory Visit (HOSPITAL_COMMUNITY)
Admission: RE | Admit: 2017-01-14 | Discharge: 2017-01-14 | Disposition: A | Discharge status: Home / Self Care | Payer: Medicare Other | Source: Ambulatory Visit | Attending: Radiation Oncology | Admitting: Radiation Oncology

## 2017-01-14 DIAGNOSIS — C3492 Malignant neoplasm of unspecified part of left bronchus or lung: Secondary | ICD-10-CM | POA: Diagnosis present

## 2017-01-14 DIAGNOSIS — J439 Emphysema, unspecified: Secondary | ICD-10-CM | POA: Insufficient documentation

## 2017-01-14 DIAGNOSIS — I700 Atherosclerosis of aorta: Secondary | ICD-10-CM | POA: Insufficient documentation

## 2017-01-14 DIAGNOSIS — E041 Nontoxic single thyroid nodule: Secondary | ICD-10-CM | POA: Diagnosis not present

## 2017-01-14 DIAGNOSIS — Z923 Personal history of irradiation: Secondary | ICD-10-CM | POA: Insufficient documentation

## 2017-01-18 ENCOUNTER — Telehealth: Payer: Self-pay | Admitting: Oncology

## 2017-01-18 NOTE — Telephone Encounter (Signed)
Called patient's wife and advised her of his CT results.  She verbalized understanding and said he is having more pain and that is why he is seeing Dr. Lysle Rubens today.  Advised her the report will be faxed to his office.

## 2017-01-18 NOTE — Telephone Encounter (Addendum)
Patient's wife called and requested that the report for his CT scan from 11/2 be faxed to Dr. Glenna Durand office at [phone].  He has an appointment there today.

## 2017-04-11 ENCOUNTER — Other Ambulatory Visit: Payer: Self-pay

## 2017-04-11 ENCOUNTER — Ambulatory Visit
Admission: RE | Admit: 2017-04-11 | Discharge: 2017-04-11 | Disposition: A | Discharge status: Home / Self Care | Payer: Medicare Other | Source: Ambulatory Visit | Attending: Radiation Oncology | Admitting: Radiation Oncology

## 2017-04-11 DIAGNOSIS — Z881 Allergy status to other antibiotic agents status: Secondary | ICD-10-CM | POA: Insufficient documentation

## 2017-04-11 DIAGNOSIS — E041 Nontoxic single thyroid nodule: Secondary | ICD-10-CM | POA: Insufficient documentation

## 2017-04-11 DIAGNOSIS — Z7982 Long term (current) use of aspirin: Secondary | ICD-10-CM | POA: Insufficient documentation

## 2017-04-11 DIAGNOSIS — C3492 Malignant neoplasm of unspecified part of left bronchus or lung: Secondary | ICD-10-CM

## 2017-04-11 DIAGNOSIS — Z880 Allergy status to penicillin: Secondary | ICD-10-CM | POA: Diagnosis not present

## 2017-04-11 DIAGNOSIS — Z79899 Other long term (current) drug therapy: Secondary | ICD-10-CM | POA: Diagnosis not present

## 2017-04-11 DIAGNOSIS — C3412 Malignant neoplasm of upper lobe, left bronchus or lung: Secondary | ICD-10-CM | POA: Insufficient documentation

## 2017-04-11 DIAGNOSIS — Z882 Allergy status to sulfonamides status: Secondary | ICD-10-CM | POA: Diagnosis not present

## 2017-04-11 DIAGNOSIS — Z888 Allergy status to other drugs, medicaments and biological substances status: Secondary | ICD-10-CM | POA: Diagnosis not present

## 2017-04-11 NOTE — Progress Notes (Signed)
[name] is here for follow up.  He denies having pain.  He denies having an increase in shortness of breath.  He does 2L of oxygen at night.  He denies having a cough and said his energy level is good.  BP 124/67 (BP Location: Left Arm, Patient Position: Sitting)   Pulse 71   Temp 98.2 F (36.8 C) (Oral)   Ht 5\' 11"  (1.803 m)   Wt 216 lb 6.4 oz (98.2 kg)   SpO2 95%   BMI 30.18 kg/m    Wt Readings from Last 3 Encounters:  04/11/17 216 lb 6.4 oz (98.2 kg)  01/10/17 210 lb 9.6 oz (95.5 kg)  11/11/16 210 lb (95.3 kg)

## 2017-04-11 NOTE — Progress Notes (Signed)
Radiation Oncology         [phone] ________________________________  Name: [name] MRN: [id number]  Date: 04/11/2017  DOB: 27-Dec-1935  Follow-Up Visit Note  CC: Wenda Low, MD  Melrose Nakayama, *    ICD-10-CM   1. Non-small cell carcinoma of left lung, stage 1 ([place]) C34.92     Diagnosis:   Clinical stage I non small cell lung cancer presenting in the left upper lobe  Interval Since Last Radiation:  9 months 10 days  (06/22/16-06/28/16) treated to left upper lobe of lung/ 54 Gy in 3 fractions  Narrative:  The patient returns today for routine follow-up of the left lung. He is accompanied by his wife today. He reports that he is doing well overall. He notes that he is on 2 L of oxygen at night. He reports that if he has exertional SOB, he just stops what he is doing in order to catch his breath.   Since the patient last visit to the office, pt has had a CT chest without contrast completed on 01/14/2017 with results revealing: IMPRESSION: Further improvement in spiculated left upper lobe nodule following radiation therapy. No specific evidence of metastatic disease. There are 2 new adjacent nodules posteriorly in the right upper lobe, likely inflammatory given their proximity. Attention on follow-up recommended. Grossly stable right thyroid nodule, hypermetabolic on previous PET-CT. This nodule was biopsied 04/02/2014. Severe coronary and Aortic Atherosclerosis. Emphysema  On review of systems, he denies CP, hemoptysis, and any other symptoms.                            ALLERGIES:  is allergic to entresto [sacubitril-valsartan]; iodinated diagnostic agents; penicillins; lumigan [bimatoprost]; lyrica [pregabalin]; sulfonamide derivatives; symbicort [budesonide-formoterol fumarate]; valsartan; advair diskus [fluticasone-salmeterol]; bacitracin; brimonidine tartrate; ciprofloxacin; flovent diskus [fluticasone propionate (inhal)]; pulmicort [budesonide]; qvar  [beclomethasone]; spiriva [tiotropium bromide monohydrate]; tessalon [benzonatate]; adhesive [tape]; flonase [fluticasone propionate]; and relafen [nabumetone].  Meds: Current Outpatient Medications  Medication Sig Dispense Refill  . albuterol (PROAIR HFA) 108 (90 BASE) MCG/ACT inhaler Inhale 2 puffs into the lungs every 6 (six) hours as needed for wheezing or shortness of breath.     Marland Kitchen albuterol (PROVENTIL) (2.5 MG/3ML) 0.083% nebulizer solution Take 2.5 mg by nebulization every 6 (six) hours as needed for wheezing or shortness of breath.    Marland Kitchen aspirin 81 MG tablet Take 81 mg by mouth daily.     Marland Kitchen CALCIUM-MAGNESIUM-VITAMIN D PO Take 1 tablet by mouth daily.    . dorzolamide-timolol (COSOPT) 22.3-6.8 MG/ML ophthalmic solution Place 1 drop into both eyes 2 (two) times daily.    . DUREZOL 0.05 % EMUL     . gabapentin (NEURONTIN) 300 MG capsule Take 600 mg by mouth 2 (two) times daily.     Marland Kitchen latanoprost (XALATAN) 0.005 % ophthalmic solution Place 1 drop into both eyes at bedtime.     . metoprolol succinate (TOPROL-XL) 25 MG 24 hr tablet Take 25 mg by mouth every morning.     . nitroGLYCERIN (NITROSTAT) 0.4 MG SL tablet Place 0.4 mg under the tongue every 5 (five) minutes as needed for chest pain. For chest pain     . OXYGEN 2lpm with sleep only    . rosuvastatin (CRESTOR) 20 MG tablet Take 20 mg by mouth daily.    . Tamsulosin HCl (FLOMAX) 0.4 MG CAPS Take 0.4 mg by mouth every evening.      No current facility-administered medications  for this encounter.     Physical Findings: The patient is in no acute distress. Patient is alert and oriented.  height is 5\' 11"  (1.803 m) and weight is 216 lb 6.4 oz (98.2 kg). His oral temperature is 98.2 F (36.8 C). His blood pressure is 124/67 and his pulse is 71. His oxygen saturation is 95%. .    Lungs are clear to auscultation bilaterally. Heart has regular rate and rhythm. No palpable cervical, supraclavicular, or axillary adenopathy. Abdomen soft,  non-tender, normal bowel sounds.    Lab Findings: Lab Results  Component Value Date   WBC 9.5 05/07/2016   HGB 14.3 05/07/2016   HCT 43.0 05/07/2016   MCV 94.5 05/07/2016   PLT 159 05/07/2016    Radiographic Findings: No results found.  Impression:   Clinically stable. No evidence of recurrence on clinical exam and most recent chest CT scan.   Plan: Scheduled for a repeat CT chest for early February. Routine follow up in 6 months otherwise.   -----------------------------------  Blair Promise, PhD, MD   This document serves as a record of services personally performed by Gery Pray, MD. It was created on his behalf by Steva Colder, a trained medical scribe. The creation of this record is based on the scribe's personal observations and the provider's statements to them. This document has been checked and approved by the attending provider.

## 2017-04-13 ENCOUNTER — Telehealth: Payer: Self-pay | Admitting: *Deleted

## 2017-04-13 NOTE — Telephone Encounter (Signed)
Called patient to inform of CT for 04-15-17- arrival time - 12:45  @ [facility], no restrictions to test, patient aware of this test

## 2017-04-15 ENCOUNTER — Ambulatory Visit (HOSPITAL_COMMUNITY)
Admission: RE | Admit: 2017-04-15 | Discharge: 2017-04-15 | Disposition: A | Discharge status: Home / Self Care | Payer: Medicare Other | Source: Ambulatory Visit | Attending: Radiation Oncology | Admitting: Radiation Oncology

## 2017-04-15 DIAGNOSIS — I2510 Atherosclerotic heart disease of native coronary artery without angina pectoris: Secondary | ICD-10-CM | POA: Diagnosis not present

## 2017-04-15 DIAGNOSIS — R918 Other nonspecific abnormal finding of lung field: Secondary | ICD-10-CM | POA: Diagnosis not present

## 2017-04-15 DIAGNOSIS — C3492 Malignant neoplasm of unspecified part of left bronchus or lung: Secondary | ICD-10-CM | POA: Insufficient documentation

## 2017-04-15 DIAGNOSIS — I700 Atherosclerosis of aorta: Secondary | ICD-10-CM | POA: Insufficient documentation

## 2017-04-15 DIAGNOSIS — J432 Centrilobular emphysema: Secondary | ICD-10-CM | POA: Diagnosis not present

## 2017-04-15 DIAGNOSIS — N62 Hypertrophy of breast: Secondary | ICD-10-CM | POA: Diagnosis not present

## 2017-04-19 ENCOUNTER — Telehealth: Payer: Self-pay | Admitting: Oncology

## 2017-04-19 NOTE — Telephone Encounter (Signed)
Notified patient of CT chest results per Dr. Sondra Come.  He verbalized understanding and did not have any questions.  Also verified next follow up on 10/10/17 at 10 am.

## 2017-10-10 ENCOUNTER — Other Ambulatory Visit: Payer: Self-pay

## 2017-10-10 ENCOUNTER — Ambulatory Visit
Admission: RE | Admit: 2017-10-10 | Discharge: 2017-10-10 | Disposition: A | Discharge status: Home / Self Care | Payer: Medicare Other | Source: Ambulatory Visit | Attending: Radiation Oncology | Admitting: Radiation Oncology

## 2017-10-10 ENCOUNTER — Encounter: Payer: Self-pay | Admitting: Radiation Oncology

## 2017-10-10 VITALS — BP 141/82 | HR 69 | Temp 97.5°F | Resp 20 | Ht 71.0 in | Wt 212.0 lb

## 2017-10-10 DIAGNOSIS — D381 Neoplasm of uncertain behavior of trachea, bronchus and lung: Secondary | ICD-10-CM | POA: Diagnosis present

## 2017-10-10 DIAGNOSIS — Z881 Allergy status to other antibiotic agents status: Secondary | ICD-10-CM | POA: Diagnosis not present

## 2017-10-10 DIAGNOSIS — Z882 Allergy status to sulfonamides status: Secondary | ICD-10-CM | POA: Insufficient documentation

## 2017-10-10 DIAGNOSIS — Z923 Personal history of irradiation: Secondary | ICD-10-CM | POA: Diagnosis not present

## 2017-10-10 DIAGNOSIS — C3492 Malignant neoplasm of unspecified part of left bronchus or lung: Secondary | ICD-10-CM

## 2017-10-10 DIAGNOSIS — Z880 Allergy status to penicillin: Secondary | ICD-10-CM | POA: Diagnosis not present

## 2017-10-10 DIAGNOSIS — Z7982 Long term (current) use of aspirin: Secondary | ICD-10-CM | POA: Insufficient documentation

## 2017-10-10 DIAGNOSIS — Z888 Allergy status to other drugs, medicaments and biological substances status: Secondary | ICD-10-CM | POA: Insufficient documentation

## 2017-10-10 DIAGNOSIS — Z9981 Dependence on supplemental oxygen: Secondary | ICD-10-CM | POA: Diagnosis not present

## 2017-10-10 DIAGNOSIS — Z79899 Other long term (current) drug therapy: Secondary | ICD-10-CM | POA: Diagnosis not present

## 2017-10-10 DIAGNOSIS — C3412 Malignant neoplasm of upper lobe, left bronchus or lung: Secondary | ICD-10-CM | POA: Insufficient documentation

## 2017-10-10 NOTE — Progress Notes (Signed)
Radiation Oncology         [phone] ________________________________  Name: [name] MRN: [id number]  Date: 10/10/2017  DOB: 14-Jul-1935  Follow-Up Visit Note  CC: Wenda Low, MD  Melrose Nakayama, *    ICD-10-CM   1. Neoplasm of uncertain behavior of left upper lobe of lung D38.1   2. Non-small cell carcinoma of left lung, stage 1 ([facility]) C34.92     Diagnosis:    Clinical stage I non small cell lung cancer presenting in the left upper lobe   Interval Since Last Radiation:  15 months   (06/22/16-06/28/16) treated to left upper lobe of lung/ 54 Gy in 3 fractions (SBRT)    Narrative:  The patient returns today for routine follow-up.  He denies any new medical issues since his last follow-up. He continues to use oxygen to sleep at night and occasionally as needed with walking or strenuous activities.  He denies any pain within the chest area but occasionally gets a cramping sensation and along the right lower chest wall. He was given muscle relaxer for this issue but this has not helped significantly. Patient denies any significant cough or hemoptysis. He denies any new bony pain headaches dizziness or blurred vision.                              ALLERGIES:  is allergic to entresto [sacubitril-valsartan]; iodinated diagnostic agents; penicillins; lumigan [bimatoprost]; lyrica [pregabalin]; sulfonamide derivatives; symbicort [budesonide-formoterol fumarate]; valsartan; advair diskus [fluticasone-salmeterol]; bacitracin; brimonidine tartrate; ciprofloxacin; flovent diskus [fluticasone propionate (inhal)]; pulmicort [budesonide]; qvar [beclomethasone]; spiriva [tiotropium bromide monohydrate]; tessalon [benzonatate]; adhesive [tape]; flonase [fluticasone propionate]; and relafen [nabumetone].  Meds: Current Outpatient Medications  Medication Sig Dispense Refill  . albuterol (PROAIR HFA) 108 (90 BASE) MCG/ACT inhaler Inhale 2 puffs into the lungs every 6 (six) hours as  needed for wheezing or shortness of breath.     Marland Kitchen albuterol (PROVENTIL) (2.5 MG/3ML) 0.083% nebulizer solution Take 2.5 mg by nebulization every 6 (six) hours as needed for wheezing or shortness of breath.    Marland Kitchen aspirin 81 MG tablet Take 81 mg by mouth daily.     Marland Kitchen CALCIUM-MAGNESIUM-VITAMIN D PO Take 1 tablet by mouth daily.    . dorzolamide-timolol (COSOPT) 22.3-6.8 MG/ML ophthalmic solution Place 1 drop into both eyes 2 (two) times daily.    Marland Kitchen gabapentin (NEURONTIN) 300 MG capsule Take 600 mg by mouth 2 (two) times daily.     Marland Kitchen latanoprost (XALATAN) 0.005 % ophthalmic solution Place 1 drop into both eyes at bedtime.     . metoprolol succinate (TOPROL-XL) 25 MG 24 hr tablet Take 25 mg by mouth every morning.     . nitroGLYCERIN (NITROSTAT) 0.4 MG SL tablet Place 0.4 mg under the tongue every 5 (five) minutes as needed for chest pain. For chest pain     . OXYGEN 2lpm with sleep only    . Tamsulosin HCl (FLOMAX) 0.4 MG CAPS Take 0.4 mg by mouth every evening.     . DUREZOL 0.05 % EMUL     . rosuvastatin (CRESTOR) 20 MG tablet Take 20 mg by mouth daily.     No current facility-administered medications for this encounter.     Physical Findings: The patient is in no acute distress. Patient is alert and oriented.  height is 5\' 11"  (1.803 m) and weight is 212 lb (96.2 kg). His oral temperature is 97.5 F (36.4 C) (abnormal). His  blood pressure is 141/82 (abnormal) and his pulse is 69. His respiration is 20 and oxygen saturation is 96%. .  No significant changes.  Lungs are clear to auscultation bilaterally. Heart has regular rate and rhythm. No palpable cervical, supraclavicular, or axillary adenopathy. Abdomen soft, non-tender, normal bowel sounds.  Lab Findings: Lab Results  Component Value Date   WBC 9.5 05/07/2016   HGB 14.3 05/07/2016   HCT 43.0 05/07/2016   MCV 94.5 05/07/2016   PLT 159 05/07/2016    Radiographic Findings: No results found.  Impression:  No evidence of recurrence on  clinical exam today. Chest CT scan a few months ago shows no suspicious areas or signs of recurrence.  Plan:  Routine follow-up in 6 months. The patient will be scheduled for a chest CT scan in the next 1-2 weeks.   ____________________________________ Gery Pray, MD

## 2017-10-10 NOTE — Progress Notes (Signed)
[name] is here for a follow-up appointment today.Patient denies any pain or fatigue. States that he get shortness of breath with activity. States that he  uses oxygen as needed.Denies any difficulty with swallowing. States that his appetite is good. Denies any skin issues. States that he coughs up phlegm sometime. Vitals:   10/10/17 0945  BP: (!) 141/82  Pulse: 69  Resp: 20  Temp: (!) 97.5 F (36.4 C)  TempSrc: Oral  SpO2: 96%  Weight: 212 lb (96.2 kg)  Height: 5\' 11"  (1.803 m)    Wt Readings from Last 3 Encounters:  10/10/17 212 lb (96.2 kg)  04/11/17 216 lb 6.4 oz (98.2 kg)  01/10/17 210 lb 9.6 oz (95.5 kg)

## 2017-10-12 ENCOUNTER — Telehealth: Payer: Self-pay | Admitting: *Deleted

## 2017-10-12 NOTE — Telephone Encounter (Signed)
CALLED PATIENT TO INFORM OF CT FOR 10-17-17- ARRIVAL TIME - 12:15 PM @ [facility], NO RESTRICTIONS TO TEST, SPOKE WITH PATIENT AND HE IS AWARE OF THIS TEST

## 2017-10-17 ENCOUNTER — Ambulatory Visit (HOSPITAL_COMMUNITY)
Admission: RE | Admit: 2017-10-17 | Discharge: 2017-10-17 | Disposition: A | Discharge status: Home / Self Care | Payer: Medicare Other | Source: Ambulatory Visit | Attending: Radiation Oncology | Admitting: Radiation Oncology

## 2017-10-17 ENCOUNTER — Encounter (HOSPITAL_COMMUNITY): Payer: Self-pay

## 2017-10-17 DIAGNOSIS — C3492 Malignant neoplasm of unspecified part of left bronchus or lung: Secondary | ICD-10-CM | POA: Diagnosis not present

## 2017-10-17 DIAGNOSIS — R918 Other nonspecific abnormal finding of lung field: Secondary | ICD-10-CM | POA: Diagnosis not present

## 2017-10-17 DIAGNOSIS — I700 Atherosclerosis of aorta: Secondary | ICD-10-CM | POA: Diagnosis not present

## 2017-10-17 DIAGNOSIS — J432 Centrilobular emphysema: Secondary | ICD-10-CM | POA: Diagnosis not present

## 2017-10-17 HISTORY — DX: Malignant neoplasm of unspecified part of unspecified bronchus or lung: C34.90

## 2017-10-24 ENCOUNTER — Other Ambulatory Visit: Payer: Self-pay | Admitting: Cardiology

## 2017-10-24 DIAGNOSIS — I6521 Occlusion and stenosis of right carotid artery: Secondary | ICD-10-CM

## 2017-11-01 ENCOUNTER — Other Ambulatory Visit: Payer: Self-pay | Admitting: Radiation Oncology

## 2017-11-01 DIAGNOSIS — C3492 Malignant neoplasm of unspecified part of left bronchus or lung: Secondary | ICD-10-CM

## 2017-11-08 ENCOUNTER — Telehealth: Payer: Self-pay | Admitting: *Deleted

## 2017-11-08 NOTE — Telephone Encounter (Signed)
CALLED PATIENT TO INFORM OF PET SCAN ON 11/17/17 - ARRIVAL TIME- 2:30 PM @ [facility], PATIENT TO BE NPO- 6 HRS. PRIOR TO TEST, PT. TO NOT SUCK ANY HARD CANDY OR CHEW ANY GUM, SPOKE WITH PATIENT'S WIFE- PHYLLIS AND SHE IS AWARE OF THIS TEST

## 2017-11-17 ENCOUNTER — Ambulatory Visit (HOSPITAL_COMMUNITY)
Admission: RE | Admit: 2017-11-17 | Discharge: 2017-11-17 | Disposition: A | Discharge status: Home / Self Care | Payer: Medicare Other | Source: Ambulatory Visit | Attending: Radiation Oncology | Admitting: Radiation Oncology

## 2017-11-17 DIAGNOSIS — J439 Emphysema, unspecified: Secondary | ICD-10-CM | POA: Diagnosis not present

## 2017-11-17 DIAGNOSIS — C3492 Malignant neoplasm of unspecified part of left bronchus or lung: Secondary | ICD-10-CM | POA: Diagnosis present

## 2017-11-17 DIAGNOSIS — E041 Nontoxic single thyroid nodule: Secondary | ICD-10-CM | POA: Insufficient documentation

## 2017-11-17 DIAGNOSIS — I700 Atherosclerosis of aorta: Secondary | ICD-10-CM | POA: Diagnosis not present

## 2017-11-17 DIAGNOSIS — I714 Abdominal aortic aneurysm, without rupture: Secondary | ICD-10-CM | POA: Diagnosis not present

## 2017-11-17 LAB — GLUCOSE, CAPILLARY: Glucose-Capillary: 106 mg/dL — ABNORMAL HIGH (ref 70–99)

## 2017-11-17 MED ORDER — FLUDEOXYGLUCOSE F - 18 (FDG) INJECTION
10.9000 | Freq: Once | INTRAVENOUS | Status: AC
  Administered 2017-11-17: 10.9 via INTRAVENOUS

## 2017-12-27 ENCOUNTER — Telehealth: Payer: Self-pay | Admitting: *Deleted

## 2017-12-27 NOTE — Telephone Encounter (Signed)
On 12-27-17 call pt to r/s appt for 05-11-18, but pt has appt for 02-27-18, didn't want r/s for February , Dr. Sondra Come out of the office

## 2018-02-02 ENCOUNTER — Encounter (HOSPITAL_BASED_OUTPATIENT_CLINIC_OR_DEPARTMENT_OTHER): Payer: Medicare Other | Attending: Internal Medicine

## 2018-02-02 DIAGNOSIS — N183 Chronic kidney disease, stage 3 (moderate): Secondary | ICD-10-CM | POA: Diagnosis not present

## 2018-02-02 DIAGNOSIS — Z7982 Long term (current) use of aspirin: Secondary | ICD-10-CM | POA: Insufficient documentation

## 2018-02-02 DIAGNOSIS — I2510 Atherosclerotic heart disease of native coronary artery without angina pectoris: Secondary | ICD-10-CM | POA: Insufficient documentation

## 2018-02-02 DIAGNOSIS — J449 Chronic obstructive pulmonary disease, unspecified: Secondary | ICD-10-CM | POA: Insufficient documentation

## 2018-02-02 DIAGNOSIS — Z85118 Personal history of other malignant neoplasm of bronchus and lung: Secondary | ICD-10-CM | POA: Insufficient documentation

## 2018-02-02 DIAGNOSIS — I87332 Chronic venous hypertension (idiopathic) with ulcer and inflammation of left lower extremity: Secondary | ICD-10-CM | POA: Diagnosis present

## 2018-02-02 DIAGNOSIS — L97821 Non-pressure chronic ulcer of other part of left lower leg limited to breakdown of skin: Secondary | ICD-10-CM | POA: Insufficient documentation

## 2018-02-02 DIAGNOSIS — I739 Peripheral vascular disease, unspecified: Secondary | ICD-10-CM | POA: Insufficient documentation

## 2018-02-02 DIAGNOSIS — M810 Age-related osteoporosis without current pathological fracture: Secondary | ICD-10-CM | POA: Diagnosis not present

## 2018-02-02 DIAGNOSIS — G4733 Obstructive sleep apnea (adult) (pediatric): Secondary | ICD-10-CM | POA: Insufficient documentation

## 2018-02-02 DIAGNOSIS — Z87891 Personal history of nicotine dependence: Secondary | ICD-10-CM | POA: Diagnosis not present

## 2018-02-06 DIAGNOSIS — I87332 Chronic venous hypertension (idiopathic) with ulcer and inflammation of left lower extremity: Secondary | ICD-10-CM | POA: Diagnosis not present

## 2018-02-14 ENCOUNTER — Encounter (HOSPITAL_BASED_OUTPATIENT_CLINIC_OR_DEPARTMENT_OTHER): Payer: Medicare Other

## 2018-02-16 ENCOUNTER — Encounter (HOSPITAL_BASED_OUTPATIENT_CLINIC_OR_DEPARTMENT_OTHER): Payer: Medicare Other | Attending: Internal Medicine

## 2018-02-16 DIAGNOSIS — Z923 Personal history of irradiation: Secondary | ICD-10-CM | POA: Insufficient documentation

## 2018-02-16 DIAGNOSIS — J449 Chronic obstructive pulmonary disease, unspecified: Secondary | ICD-10-CM | POA: Diagnosis not present

## 2018-02-16 DIAGNOSIS — I252 Old myocardial infarction: Secondary | ICD-10-CM | POA: Insufficient documentation

## 2018-02-16 DIAGNOSIS — L97222 Non-pressure chronic ulcer of left calf with fat layer exposed: Secondary | ICD-10-CM | POA: Diagnosis present

## 2018-02-16 DIAGNOSIS — G4730 Sleep apnea, unspecified: Secondary | ICD-10-CM | POA: Insufficient documentation

## 2018-02-16 DIAGNOSIS — I87322 Chronic venous hypertension (idiopathic) with inflammation of left lower extremity: Secondary | ICD-10-CM | POA: Diagnosis not present

## 2018-02-27 ENCOUNTER — Ambulatory Visit
Admission: RE | Admit: 2018-02-27 | Discharge: 2018-02-27 | Disposition: A | Discharge status: Home / Self Care | Payer: Medicare Other | Source: Ambulatory Visit | Attending: Radiation Oncology | Admitting: Radiation Oncology

## 2018-02-27 ENCOUNTER — Encounter: Payer: Self-pay | Admitting: Radiation Oncology

## 2018-02-27 ENCOUNTER — Other Ambulatory Visit: Payer: Self-pay

## 2018-02-27 VITALS — BP 124/82 | HR 78 | Temp 98.3°F | Resp 18 | Ht 71.0 in | Wt 214.0 lb

## 2018-02-27 DIAGNOSIS — C3492 Malignant neoplasm of unspecified part of left bronchus or lung: Secondary | ICD-10-CM | POA: Diagnosis present

## 2018-02-27 DIAGNOSIS — Z79899 Other long term (current) drug therapy: Secondary | ICD-10-CM | POA: Insufficient documentation

## 2018-02-27 NOTE — Progress Notes (Signed)
Radiation Oncology         [phone] ________________________________  Name: [name] MRN: [id number]  Date: 02/27/2018  DOB: [birth year]  Follow-Up Visit Note  CC: Wenda Low, MD  Melrose Nakayama, *    ICD-10-CM   1. Non-small cell carcinoma of left lung, stage 1 ([place]) C34.92     Diagnosis:    Clinical stage I non small cell lung cancer presenting in the left upper lobe   Interval Since Last Radiation:  1 year, 8 months   (06/22/16-06/28/16) treated to left upper lobe of lung/ 54 Gy in 3 fractions (SBRT)    Narrative:  Patient returns today for routine f/u. He reports continuing to use supplemental oxygen at night. He also reports SOB on exertion, anything more than walking across the hospital parking lot. He denies associated pain, but does have associated intermittent cramping on his right lower chest wall. He denies spitting up blood but occasionally coughs up phlegm.     He had a PET scan on November 21, 2017 which revealed Focal low level hypermetabolism (max SUV 3.1) associated with the 1.6 cm nodular focus of consolidation in the apical left upper lung lobe adjacent to the fiducial markers. The focality of uptake is concerning for early tumor recurrence. No hypermetabolic thoracic adenopathy or distant metastatic disease was identified at this time. There was a persistent hypermetabolic 1.9 cm right thyroid lobe nodule identified, which could not be excluded as a primary thyroid malignancy.  Stable 3.9 cm infrarenal abdominal Aortic Aneurysm (ICD10-I71.9) was noted as well. Aortic Atherosclerosis (ICD10-I70.0) and Emphysema (ICD10-J43.9).                         ALLERGIES:  is allergic to entresto [sacubitril-valsartan]; iodinated diagnostic agents; penicillins; lumigan [bimatoprost]; lyrica [pregabalin]; sulfonamide derivatives; symbicort [budesonide-formoterol fumarate]; valsartan; advair diskus [fluticasone-salmeterol]; bacitracin; brimonidine tartrate;  ciprofloxacin; flovent diskus [fluticasone propionate (inhal)]; pulmicort [budesonide]; qvar [beclomethasone]; spiriva [tiotropium bromide monohydrate]; tessalon [benzonatate]; adhesive [tape]; flonase [fluticasone propionate]; and relafen [nabumetone].  Meds: Current Outpatient Medications  Medication Sig Dispense Refill  . albuterol (PROAIR HFA) 108 (90 BASE) MCG/ACT inhaler Inhale 2 puffs into the lungs every 6 (six) hours as needed for wheezing or shortness of breath.     Marland Kitchen albuterol (PROVENTIL) (2.5 MG/3ML) 0.083% nebulizer solution Take 2.5 mg by nebulization every 6 (six) hours as needed for wheezing or shortness of breath.    Marland Kitchen aspirin 81 MG tablet Take 81 mg by mouth daily.     Marland Kitchen CALCIUM-MAGNESIUM-VITAMIN D PO Take 1 tablet by mouth daily.    . dorzolamide-timolol (COSOPT) 22.3-6.8 MG/ML ophthalmic solution Place 1 drop into both eyes 2 (two) times daily.    . DUREZOL 0.05 % EMUL     . gabapentin (NEURONTIN) 300 MG capsule Take 600 mg by mouth 2 (two) times daily.     Marland Kitchen latanoprost (XALATAN) 0.005 % ophthalmic solution Place 1 drop into both eyes at bedtime.     . metoprolol succinate (TOPROL-XL) 25 MG 24 hr tablet Take 25 mg by mouth every morning.     . OXYGEN 2lpm with sleep only    . rosuvastatin (CRESTOR) 20 MG tablet Take 20 mg by mouth daily.    . Tamsulosin HCl (FLOMAX) 0.4 MG CAPS Take 0.4 mg by mouth every evening.     . nitroGLYCERIN (NITROSTAT) 0.4 MG SL tablet Place 0.4 mg under the tongue every 5 (five) minutes as needed for chest pain. For  chest pain      No current facility-administered medications for this encounter.     Physical Findings: The patient is in no acute distress. Patient is alert and oriented.  height is 5\' 11"  (1.803 m) and weight is 214 lb (97.1 kg). His oral temperature is 98.3 F (36.8 C). His blood pressure is 124/82 and his pulse is 78. His respiration is 18 and oxygen saturation is 94%. .  No significant changes.  Lungs are clear to auscultation  bilaterally. Heart has regular rate and rhythm. No palpable cervical, supraclavicular, or axillary adenopathy. Abdomen soft, non-tender, normal bowel sounds.  Lab Findings: Lab Results  Component Value Date   WBC 9.5 05/07/2016   HGB 14.3 05/07/2016   HCT 43.0 05/07/2016   MCV 94.5 05/07/2016   PLT 159 05/07/2016    Radiographic Findings: No results found.  Impression: Clinically stable. PET scan showed questionable activity, in light of this we will repeat a chest CT scan in the next few days.   Plan:  Routine follow-up in 3 months. CT scan of the chest soon. -----------------------------------  Blair Promise, PhD, MD This document serves as a record of services personally performed by Gery Pray, MD. It was created on his behalf by Mary-Margaret Loma Messing, a trained medical scribe. The creation of this record is based on the scribe's personal observations and the provider's statements to them. This document has been checked and approved by the attending provider.

## 2018-02-27 NOTE — Progress Notes (Signed)
[name] presents today for his 3 month follow up with Dr. Sondra Come. Reports mild fatigue that has stabilized since completing treatment. Denies any skin issues. Denies any chest pain. Reports mild shortness of breath with exertion but states it is stable. Reports increased coughing that produces thick, clear sputum. Denies any changes to appetite. Over reports feeling well and looking forward to spending the holidays with his family.   Vitals:   02/27/18 1028  BP: 124/82  Pulse: 78  Resp: 18  Temp: 98.3 F (36.8 C)  TempSrc: Oral  SpO2: 94%  Weight: 214 lb (97.1 kg)  Height: 5\' 11"  (1.803 m)   Wt Readings from Last 3 Encounters:  02/27/18 214 lb (97.1 kg)  10/10/17 212 lb (96.2 kg)  04/11/17 216 lb 6.4 oz (98.2 kg)

## 2018-03-02 DIAGNOSIS — L97222 Non-pressure chronic ulcer of left calf with fat layer exposed: Secondary | ICD-10-CM | POA: Diagnosis not present

## 2018-03-16 ENCOUNTER — Encounter (HOSPITAL_BASED_OUTPATIENT_CLINIC_OR_DEPARTMENT_OTHER): Payer: Medicare Other | Attending: Internal Medicine

## 2018-03-16 DIAGNOSIS — Z09 Encounter for follow-up examination after completed treatment for conditions other than malignant neoplasm: Secondary | ICD-10-CM | POA: Insufficient documentation

## 2018-03-16 DIAGNOSIS — Z923 Personal history of irradiation: Secondary | ICD-10-CM | POA: Insufficient documentation

## 2018-03-16 DIAGNOSIS — J449 Chronic obstructive pulmonary disease, unspecified: Secondary | ICD-10-CM | POA: Diagnosis not present

## 2018-03-16 DIAGNOSIS — G4730 Sleep apnea, unspecified: Secondary | ICD-10-CM | POA: Insufficient documentation

## 2018-03-16 DIAGNOSIS — I87302 Chronic venous hypertension (idiopathic) without complications of left lower extremity: Secondary | ICD-10-CM | POA: Diagnosis not present

## 2018-03-16 DIAGNOSIS — I252 Old myocardial infarction: Secondary | ICD-10-CM | POA: Diagnosis not present

## 2018-03-16 DIAGNOSIS — Z872 Personal history of diseases of the skin and subcutaneous tissue: Secondary | ICD-10-CM | POA: Diagnosis not present

## 2018-03-20 ENCOUNTER — Telehealth: Payer: Self-pay | Admitting: *Deleted

## 2018-03-20 NOTE — Telephone Encounter (Signed)
Called patient to inform of CT for 03-22-18 - arrival time - 3:15 pm @ [facility], no restrictions to test, spoke with patient's wife- Silva Bandy and she is aware of this test

## 2018-03-22 ENCOUNTER — Ambulatory Visit (HOSPITAL_COMMUNITY)
Admission: RE | Admit: 2018-03-22 | Discharge: 2018-03-22 | Disposition: A | Discharge status: Home / Self Care | Payer: Medicare Other | Source: Ambulatory Visit | Attending: Radiation Oncology | Admitting: Radiation Oncology

## 2018-03-22 DIAGNOSIS — C3492 Malignant neoplasm of unspecified part of left bronchus or lung: Secondary | ICD-10-CM | POA: Insufficient documentation

## 2018-03-27 ENCOUNTER — Ambulatory Visit
Admission: RE | Admit: 2018-03-27 | Discharge: 2018-03-27 | Disposition: A | Discharge status: Home / Self Care | Payer: Medicare Other | Source: Ambulatory Visit | Attending: Cardiology | Admitting: Cardiology

## 2018-03-27 DIAGNOSIS — I6521 Occlusion and stenosis of right carotid artery: Secondary | ICD-10-CM

## 2018-03-31 ENCOUNTER — Telehealth: Payer: Self-pay | Admitting: *Deleted

## 2018-03-31 NOTE — Telephone Encounter (Signed)
Called patient to inform that Dr. Sondra Come is not here today, informed that info. was passed to nurse and Dr. Sondra Come will call her on Monday

## 2018-04-12 ENCOUNTER — Telehealth: Payer: Self-pay

## 2018-04-12 NOTE — Telephone Encounter (Signed)
Contacted pt per Dr. Sondra Come to convey that tumor board felt that surveillance was the best course and Dr. Sondra Come would order a CT scan prior to 05/29/18 appt. Pt verbalized understanding and agreement. Loma Sousa, RN BSN

## 2018-04-16 ENCOUNTER — Other Ambulatory Visit: Payer: Self-pay | Admitting: Radiation Oncology

## 2018-04-16 DIAGNOSIS — C3492 Malignant neoplasm of unspecified part of left bronchus or lung: Secondary | ICD-10-CM

## 2018-04-27 ENCOUNTER — Ambulatory Visit: Payer: Medicare Other | Admitting: Cardiology

## 2018-04-27 ENCOUNTER — Encounter: Payer: Self-pay | Admitting: Cardiology

## 2018-04-27 VITALS — BP 112/71 | HR 73 | Ht 71.0 in | Wt 213.2 lb

## 2018-04-27 DIAGNOSIS — J449 Chronic obstructive pulmonary disease, unspecified: Secondary | ICD-10-CM

## 2018-04-27 DIAGNOSIS — I255 Ischemic cardiomyopathy: Secondary | ICD-10-CM

## 2018-04-27 DIAGNOSIS — I6523 Occlusion and stenosis of bilateral carotid arteries: Secondary | ICD-10-CM | POA: Diagnosis not present

## 2018-04-27 DIAGNOSIS — I714 Abdominal aortic aneurysm, without rupture, unspecified: Secondary | ICD-10-CM

## 2018-04-27 DIAGNOSIS — I2510 Atherosclerotic heart disease of native coronary artery without angina pectoris: Secondary | ICD-10-CM

## 2018-04-27 DIAGNOSIS — E7800 Pure hypercholesterolemia, unspecified: Secondary | ICD-10-CM

## 2018-04-27 NOTE — Progress Notes (Signed)
Subjective:  Primary Physician:  Wenda Low, MD  Patient ID: [name], male    DOB: 05-Feb-1936, 83 y.o.   MRN: [id number]  Chief Complaint  Patient presents with  . Coronary Artery Disease  . Follow-up    HPI: Omar HOBBY "[place]"  is a 83 y.o. male  with  history of non-STEMI and PCI with DES to RCA in 2011 and circumflex stenting in 2013, ischemic cardiomyopathy with mildly reduced LVEF around 45% by echocardiogram on January 2018, hypertension and hyperlipidemia, asymptomatic right carotid stenosis, small 3.3 cm stable AAA since 1980s and followed by his PCP, severe COPD from prior tobacco use and presently on nocturnal oxygen, sleep apnea unable to tolerate CPAP multiple medication allergies, chronic back pain and spinal fusion in the past. History of lung cancer status post radiation therapy and follows Dr. Gery Pray since February 2018.  He states his priority is now quality of life and asserts that he does not want any extraordinary life-saving measures or CPR.  He has known peripheral artery disease with a slow healing ulcer in his left foot which is now healed.  He does have leg cramps with activity but is most limiting factor has been marked dyspnea even with minimal activity.  He is accompanied by his wife, denies any exertional chest pain.  No change in his weight.  Appetite has been good.  Past Medical History:  Diagnosis Date  . AAA (abdominal aortic aneurysm) ([facility])    measures 2.7 cm.  . Arthritis   . Back pain   . BPH (benign prostatic hypertrophy)   . Bruises easily   . CAD (coronary artery disease)   . Cervical spine fracture ([place]) feb 1990   C 6, C 7 and T 1, no surgery done wore halo brace  . CLL (chronic lymphocytic leukemia) ([place])    hx of worked up 2009 by dr Benay Spice, no tx done, released by dr Benay Spice  . Complication of anesthesia    cannot lie on right side gets vertigo and trouble turning neck to right  . COPD (chronic obstructive  pulmonary disease) ([place]) may 2005  . Erectile dysfunction   . Fatigue   . GERD (gastroesophageal reflux disease)   . Glaucoma   . Hepatitis as child   serum hepatitis  . History of oxygen administration    oxygen  @2   l/m nasally at bedtime.  Marland Kitchen History of radiation therapy 06/22/16-06/28/16   Left upper lobe of lung/ 54 Gy in 3 fractions  . Hyperlipidemia   . Hypertension   . Impaired hearing    wears Hearing aids  . Ischemic cardiomyopathy   . Leg pain   . Lumbar disc disease   . Lung cancer ([place]) dx'd 03/18/16  . Microscopic hematuria   . Myocardial infarct ([place]) 03-17-2009   Hx MI 2000  . Neuropathy    both feet and legs  . Osteopenia   . Pneumonia    hx x2  . Thyroid nodule     Past Surgical History:  Procedure Laterality Date  . APPENDECTOMY  sept, 10, 2005  . BACK SURGERY  2009   L 2 and L 3   . benign cyst removed from left neck  02-2010  . CERVICAL DISC SURGERY  2005-2008   C 2, C 3 and C 4 2005, rods placed C 6, C 7 and T 1  . COLONOSCOPY WITH PROPOFOL N/A 11/26/2014   Procedure: COLONOSCOPY WITH PROPOFOL;  Surgeon: Hassell Done  Sandria Senter, MD;  Location: [facility];  Service: Endoscopy;  Laterality: N/A;  . CORONARY ANGIOPLASTY WITH STENT PLACEMENT  2013   1 stent repair, one stent replaced  . ESOPHAGOGASTRODUODENOSCOPY (EGD) WITH PROPOFOL N/A 11/27/2013   Procedure: ESOPHAGOGASTRODUODENOSCOPY (EGD) WITH PROPOFOL;  Surgeon: Garlan Fair, MD;  Location: [facility];  Service: Endoscopy;  Laterality: N/A;  . EYE SURGERY Bilateral july 2011   eye surgry for glaucoma  . FUDUCIAL PLACEMENT N/A 05/12/2016   Procedure: PLACEMENT OF FUDUCIAL;  Surgeon: Melrose Nakayama, MD;  Location: [facility];  Service: Thoracic;  Laterality: N/A;  . fusion L 3 and L 4  August 15, 2008  . LEFT HEART CATHETERIZATION WITH CORONARY ANGIOGRAM N/A 08/12/2011   Procedure: LEFT HEART CATHETERIZATION WITH CORONARY ANGIOGRAM;  Surgeon: Larey Dresser, MD;  Location: [facility];  Service:  Cardiovascular;  Laterality: N/A;  . NASAL SINUS SURGERY   oct 2005  . right knee arthroscopy  1981  . stent top heart  2011   x 2  . TONSILLECTOMY  1943  . VIDEO BRONCHOSCOPY WITH ENDOBRONCHIAL NAVIGATION N/A 05/12/2016   Procedure: VIDEO BRONCHOSCOPY WITH ENDOBRONCHIAL NAVIGATION;  Surgeon: Melrose Nakayama, MD;  Location: [facility];  Service: Thoracic;  Laterality: N/A;    Social History   Socioeconomic History  . Marital status: Married    Spouse name: Not on file  . Number of children: 1  . Years of education: Not on file  . Highest education level: Not on file  Occupational History  . Not on file  Social Needs  . Financial resource strain: Not on file  . Food insecurity:    Worry: Not on file    Inability: Not on file  . Transportation needs:    Medical: Not on file    Non-medical: Not on file  Tobacco Use  . Smoking status: Former Smoker    Packs/day: 2.00    Years: 55.00    Pack years: 110.00    Types: Cigarettes    Last attempt to quit: 07/14/2006    Years since quitting: 11.7  . Smokeless tobacco: Never Used  Substance and Sexual Activity  . Alcohol use: No  . Drug use: No  . Sexual activity: Not on file  Lifestyle  . Physical activity:    Days per week: Not on file    Minutes per session: Not on file  . Stress: Not on file  Relationships  . Social connections:    Talks on phone: Not on file    Gets together: Not on file    Attends religious service: Not on file    Active member of club or organization: Not on file    Attends meetings of clubs or organizations: Not on file    Relationship status: Not on file  . Intimate partner violence:    Fear of current or ex partner: Not on file    Emotionally abused: Not on file    Physically abused: Not on file    Forced sexual activity: Not on file  Other Topics Concern  . Not on file  Social History Narrative  . Not on file    Current Outpatient Medications on File Prior to Visit  Medication Sig  Dispense Refill  . albuterol (PROAIR HFA) 108 (90 BASE) MCG/ACT inhaler Inhale 2 puffs into the lungs every 6 (six) hours as needed for wheezing or shortness of breath.     Marland Kitchen albuterol (PROVENTIL) (2.5 MG/3ML) 0.083% nebulizer solution Take  2.5 mg by nebulization every 6 (six) hours as needed for wheezing or shortness of breath.    Marland Kitchen aspirin 81 MG tablet Take 81 mg by mouth daily.     Marland Kitchen atorvastatin (LIPITOR) 20 MG tablet     . CALCIUM-MAGNESIUM-VITAMIN D PO Take 1 tablet by mouth daily.    Marland Kitchen gabapentin (NEURONTIN) 300 MG capsule Take 600 mg by mouth 2 (two) times daily.     Marland Kitchen latanoprost (XALATAN) 0.005 % ophthalmic solution Place 1 drop into both eyes at bedtime.     . methocarbamol (ROBAXIN) 750 MG tablet Take 750 mg by mouth as needed for muscle spasms.    . metoprolol succinate (TOPROL-XL) 25 MG 24 hr tablet Take 25 mg by mouth every morning.     . nitroGLYCERIN (NITROSTAT) 0.4 MG SL tablet Place 0.4 mg under the tongue every 5 (five) minutes as needed for chest pain. For chest pain     . OXYGEN 2lpm with sleep only    . Tamsulosin HCl (FLOMAX) 0.4 MG CAPS Take 0.4 mg by mouth every evening.     Marland Kitchen alendronate (FOSAMAX) 70 MG tablet     . dorzolamide-timolol (COSOPT) 22.3-6.8 MG/ML ophthalmic solution Place 1 drop into both eyes 2 (two) times daily.    . DUREZOL 0.05 % EMUL     . rosuvastatin (CRESTOR) 20 MG tablet Take 20 mg by mouth daily.     No current facility-administered medications on file prior to visit.    Review of Systems  Constitutional: Negative for malaise/fatigue and weight loss.  Respiratory: Positive for cough (chronic) and shortness of breath. Negative for hemoptysis.   Cardiovascular: Positive for claudication. Negative for chest pain, palpitations and leg swelling.  Gastrointestinal: Negative for abdominal pain, blood in stool, constipation, heartburn and vomiting.  Genitourinary: Negative for dysuria.  Musculoskeletal: Positive for back pain and joint pain.  Negative for myalgias.  Neurological: Negative for dizziness, focal weakness and headaches.  Endo/Heme/Allergies: Does not bruise/bleed easily.  Psychiatric/Behavioral: Negative for depression. The patient is not nervous/anxious.   All other systems reviewed and are negative.      Objective:  Blood pressure 112/71, pulse 73, height 5\' 11"  (1.803 m), weight 213 lb 3.2 oz (96.7 kg), SpO2 90 %.  Physical Exam  Constitutional: He appears well-developed. No distress.  Mildly obese  HENT:  Head: Atraumatic.  Eyes: Conjunctivae are normal.  Neck: Neck supple. No JVD present. No thyromegaly present.  Cardiovascular: Normal rate, regular rhythm and intact distal pulses. Exam reveals no gallop.  Murmur (2/6 ejection systolic murmur in upper parasternal border) heard. Pulses:      Carotid pulses are on the right side with bruit and on the left side with bruit.      Radial pulses are 2+ on the right side and 2+ on the left side.       Femoral pulses are 1+ on the right side with bruit and 1+ on the left side with bruit.      Popliteal pulses are 0 on the right side and 0 on the left side.       Dorsalis pedis pulses are 0 on the right side and 0 on the left side.       Posterior tibial pulses are 0 on the right side and 0 on the left side.  Pulmonary/Chest: Effort normal. He has rales (bilateral base coarse crackles).  Barrel shaped  Abdominal: Soft. Bowel sounds are normal.  Musculoskeletal: Normal range of motion.  General: No edema.  Neurological: He is alert.  Skin: Skin is warm and dry.  Psychiatric: He has a normal mood and affect.   CARDIAC STUDIES:  Carlton Adam) Nuclear stress 07/30/15: EF: 55%. Dyssynchronous contractile performance There was no ST segment deviation noted during stress. This is a low risk study. No ischemia. No obvious old infarction.  Echocardiogram 03/23/2016: Poor echo window. Wall motion abnormality has reduced sensitivity. Left ventricle cavity is normal  in size. Mild Diffuse hypokinesis Mild concentric hypertrophy of the left ventricle. Mild decrease in LV systolic function. Grade I diastolic dysfunction. Visual EF is 45%. Calculated EF 38%. Right ventricle cavity is borderline dilated. Normal right ventricular function. Trace tricuspid regurgitation. No evidence of pulmonary hypertension.  Carotid duplex 03/27/18: Moderate amount of plaque at the carotid bulbs and internal carotid arteries, right side greater than left. Estimated degree of stenosis in the internal carotid arteries is less than 50% bilaterally. Patent vertebral arteries with antegrade flow. Solitary thyroid nodule noted and was previously evaluated.   Assessment & Recommendations:   1. Coronary artery disease involving native coronary artery of native heart without angina pectoris EKG 04/27/2018: Normal sinus rhythm at rate of 68 bpm, normal axis.  No evidence of ischemia, normal EKG. Coronary angiogram:  CAD s/p NSTEMI and PCI and 03/2009 (DES to RCA) and [place] and PCI in 07/2011 (DES to proximal CX and balloon angioplasty to proximal RCA in-stent restenosis)  2. Ischemic cardiomyopathy Presently on appropriate medical therapy, remains without any clinical evidence of congestive heart failure.  3. Carotid stenosis Mild bilateral < 50%. Will probably scan in 2 years unless clincally indicated.  4. Abdominal aortic aneurysm (AAA) without rupture ([place]) Follows PCP  5. Pure hypercholesterolemia Lipids previously well controlled, he is a complete physical examination is scheduled annually and follows with PCP.  6. COPD GOLD III with reversibility COPD and chronic bronchitis have become mainstay issue with limitations doing routine activities.  He does have peripheral arterial disease with symptoms of claudication.  In view of minimal symptoms, as claudication is not lifestyle limiting, recommend continued observation for now and continue medical therapy.  Overall on appropriate  medical therapy, I'll see him back in one year following his complete physical examination with his PCP.  Adrian Prows, MD, [facility] 04/27/2018, 11:13 AM [facility] [place] Pager: [phone] Office: [phone] If no answer Cell [phone]

## 2018-05-11 ENCOUNTER — Ambulatory Visit: Payer: Self-pay | Admitting: Radiation Oncology

## 2018-05-15 ENCOUNTER — Telehealth: Payer: Self-pay | Admitting: *Deleted

## 2018-05-15 NOTE — Telephone Encounter (Signed)
RETURNED PATIENT'S PHONE CALL, SPOKE WITH PATIENT. ?

## 2018-05-24 ENCOUNTER — Telehealth: Payer: Self-pay | Admitting: *Deleted

## 2018-05-24 NOTE — Telephone Encounter (Signed)
CALLED PATIENT TO INFORM OF CT FOR 05-26-18 - ARRIVAL TIME - 3:45 PM @ [facility], NO RESTRICTIONS TO TEST, SPOKE WITH PATIENT'S WIFE- PHYLLIS AND SHE IS AWARE OF THIS TEST

## 2018-05-26 ENCOUNTER — Ambulatory Visit (HOSPITAL_COMMUNITY)
Admission: RE | Admit: 2018-05-26 | Discharge: 2018-05-26 | Disposition: A | Discharge status: Home / Self Care | Payer: Medicare Other | Source: Ambulatory Visit | Attending: Radiation Oncology | Admitting: Radiation Oncology

## 2018-05-26 ENCOUNTER — Other Ambulatory Visit: Payer: Self-pay

## 2018-05-26 DIAGNOSIS — C3492 Malignant neoplasm of unspecified part of left bronchus or lung: Secondary | ICD-10-CM | POA: Diagnosis not present

## 2018-05-29 ENCOUNTER — Other Ambulatory Visit: Payer: Self-pay

## 2018-05-29 ENCOUNTER — Encounter: Payer: Self-pay | Admitting: Radiation Oncology

## 2018-05-29 ENCOUNTER — Ambulatory Visit
Admission: RE | Admit: 2018-05-29 | Discharge: 2018-05-29 | Disposition: A | Discharge status: Home / Self Care | Payer: Medicare Other | Source: Ambulatory Visit | Attending: Radiation Oncology | Admitting: Radiation Oncology

## 2018-05-29 VITALS — BP 117/74 | HR 72 | Temp 97.7°F | Resp 18 | Ht 71.0 in | Wt 214.1 lb

## 2018-05-29 DIAGNOSIS — Z7982 Long term (current) use of aspirin: Secondary | ICD-10-CM | POA: Insufficient documentation

## 2018-05-29 DIAGNOSIS — Z85118 Personal history of other malignant neoplasm of bronchus and lung: Secondary | ICD-10-CM | POA: Insufficient documentation

## 2018-05-29 DIAGNOSIS — Z923 Personal history of irradiation: Secondary | ICD-10-CM | POA: Insufficient documentation

## 2018-05-29 DIAGNOSIS — I700 Atherosclerosis of aorta: Secondary | ICD-10-CM | POA: Insufficient documentation

## 2018-05-29 DIAGNOSIS — I2510 Atherosclerotic heart disease of native coronary artery without angina pectoris: Secondary | ICD-10-CM | POA: Insufficient documentation

## 2018-05-29 DIAGNOSIS — C3492 Malignant neoplasm of unspecified part of left bronchus or lung: Secondary | ICD-10-CM

## 2018-05-29 DIAGNOSIS — Z79899 Other long term (current) drug therapy: Secondary | ICD-10-CM | POA: Insufficient documentation

## 2018-05-29 NOTE — Progress Notes (Signed)
Radiation Oncology         [phone] ________________________________  Name: [name] MRN: [id number]  Date: 05/29/2018  DOB: 23-Apr-1935  Follow-Up Visit Note  CC: Wenda Low, MD  Melrose Nakayama, *    ICD-10-CM   1. Non-small cell carcinoma of left lung, stage 1 ([place]) C34.92     Diagnosis:    Clinical stage I, non-small cell lung cancer presenting in the left upper lobe  Interval Since Last Radiation:  1 year, 11 months   (06/22/16-06/28/16) treated to left upper lobe of lung/ 54 Gy in 3 fractions (SBRT)  Narrative:  Patient returns today for routine f/u. He is accompanied by his wife today. He reports continuing to use supplemental oxygen at night (2L) and occasionally during the day. He denies hemoptysis but reports minor chest congestion.  He had a CT scan without contrast on 03/22/18 which showed a 1.7 x 2.8 cm nodular opacity in the anterior left lung apex, grossly unchanged when measured in a similar fashion, worrisome for recurrent disease given hypermetabolism on recent PET. Additional 12 x 7 mm subpleural nodule in the medial right lower lobe, non FDG avid, likely benign.  A follow-up scan was done on 05/26/18 which was stable compared to 1/8. Post treatment related changes in the apex of the left upper lobe appear very similar to the prior study. No definite findings to suggest metastatic disease in the thorax. Stable 12 x 7 mm subpleural nodule in the medial aspect of the right lower lobe. This was previously not hypermetabolic, likely benign. Diffuse bronchial wall thickening with mild to moderate centrilobular and paraseptal emphysema; imaging findings suggestive of underlying COPD.                   ALLERGIES:  is allergic to entresto [sacubitril-valsartan]; iodinated diagnostic agents; penicillins; lumigan [bimatoprost]; lyrica [pregabalin]; sulfonamide derivatives; symbicort [budesonide-formoterol fumarate]; valsartan; advair diskus [fluticasone-salmeterol];  bacitracin; brimonidine tartrate; ciprofloxacin; flovent diskus [fluticasone propionate (inhal)]; pulmicort [budesonide]; qvar [beclomethasone]; spiriva [tiotropium bromide monohydrate]; tessalon [benzonatate]; adhesive [tape]; flonase [fluticasone propionate]; and relafen [nabumetone].  Meds: Current Outpatient Medications  Medication Sig Dispense Refill  . albuterol (PROAIR HFA) 108 (90 BASE) MCG/ACT inhaler Inhale 2 puffs into the lungs every 6 (six) hours as needed for wheezing or shortness of breath.     Marland Kitchen albuterol (PROVENTIL) (2.5 MG/3ML) 0.083% nebulizer solution Take 2.5 mg by nebulization every 6 (six) hours as needed for wheezing or shortness of breath.    Marland Kitchen alendronate (FOSAMAX) 70 MG tablet     . aspirin 81 MG tablet Take 81 mg by mouth daily.     Marland Kitchen atorvastatin (LIPITOR) 20 MG tablet     . CALCIUM-MAGNESIUM-VITAMIN D PO Take 1 tablet by mouth daily.    . dorzolamide-timolol (COSOPT) 22.3-6.8 MG/ML ophthalmic solution Place 1 drop into both eyes 2 (two) times daily.    . DUREZOL 0.05 % EMUL     . gabapentin (NEURONTIN) 300 MG capsule Take 600 mg by mouth 2 (two) times daily.     Marland Kitchen latanoprost (XALATAN) 0.005 % ophthalmic solution Place 1 drop into both eyes at bedtime.     . methocarbamol (ROBAXIN) 750 MG tablet Take 750 mg by mouth as needed for muscle spasms.    . metoprolol succinate (TOPROL-XL) 25 MG 24 hr tablet Take 25 mg by mouth every morning.     . nitroGLYCERIN (NITROSTAT) 0.4 MG SL tablet Place 0.4 mg under the tongue every 5 (five) minutes as needed  for chest pain. For chest pain     . OXYGEN 2lpm with sleep only    . rosuvastatin (CRESTOR) 20 MG tablet Take 20 mg by mouth daily.    . Tamsulosin HCl (FLOMAX) 0.4 MG CAPS Take 0.4 mg by mouth every evening.      No current facility-administered medications for this encounter.     Physical Findings: The patient is in no acute distress. Patient is alert and oriented.  height is 5\' 11"  (1.803 m) and weight is 214 lb 2  oz (97.1 kg). His oral temperature is 97.7 F (36.5 C). His blood pressure is 117/74 and his pulse is 72. His respiration is 18 and oxygen saturation is 92%. .  No significant changes.  Lungs are clear to auscultation bilaterally. Heart has regular rate and rhythm. No palpable cervical, supraclavicular, or axillary adenopathy. Abdomen soft, non-tender, normal bowel sounds.  Lab Findings: Lab Results  Component Value Date   WBC 9.5 05/07/2016   HGB 14.3 05/07/2016   HCT 43.0 05/07/2016   MCV 94.5 05/07/2016   PLT 159 05/07/2016    Radiographic Findings: Ct Chest Wo Contrast  Result Date: 05/26/2018 CLINICAL DATA:  83 year old male with history of left upper lobe lung cancer diagnosed in January 2018. Follow-up study. EXAM: CT CHEST WITHOUT CONTRAST TECHNIQUE: Multidetector CT imaging of the chest was performed following the standard protocol without IV contrast. COMPARISON:  Multiple priors, most recently 03/22/2018. FINDINGS: Cardiovascular: Heart size is normal. There is no significant pericardial fluid, thickening or pericardial calcification. There is aortic atherosclerosis, as well as atherosclerosis of the great vessels of the mediastinum and the coronary arteries, including calcified atherosclerotic plaque in the left main, left anterior descending, left circumflex and right coronary arteries. Calcifications of the aortic valve. Mediastinum/Nodes: No pathologically enlarged mediastinal or hilar lymph nodes. Please note that accurate exclusion of hilar adenopathy is limited on noncontrast CT scans. Esophagus is unremarkable in appearance. No axillary lymphadenopathy. Lungs/Pleura: Fiducial markers are noted in the apex of the left upper lobe in the midst of an area of nodular architectural distortion, most compatible with evolving postradiation changes at the site of treated neoplasm this appears essentially stable compared to the prior examination. 12 x 7 mm subpleural pulmonary nodule in the  medial aspect of the right lower lobe (axial image 119 of series 5), stable compared to the prior study. No other suspicious appearing pulmonary nodules or masses are noted. No acute consolidative airspace disease. No pleural effusions. Diffuse bronchial wall thickening with mild to moderate centrilobular and paraseptal emphysema. Linear scarring in the right upper lobe. Upper Abdomen: Multiple low-attenuation lesions in both kidneys, incompletely characterized on today's noncontrast CT examination, but similar to prior studies and statistically likely to represent cysts, largest of which is a 4.7 cm low-attenuation lesion in the upper pole the left kidney. Aortic atherosclerosis. Musculoskeletal: Chronic T1 compression fracture with 50% loss of anterior vertebral body height, unchanged. There are no aggressive appearing lytic or blastic lesions noted in the visualized portions of the skeleton. IMPRESSION: 1. Stable examination. Post treatment related changes in the apex of the left upper lobe appear very similar to the prior study. No definite findings to suggest metastatic disease in the thorax. 2. Stable 12 x 7 mm subpleural nodule in the medial aspect of the right lower lobe. This was previously not hypermetabolic, likely benign. 3. Diffuse bronchial wall thickening with mild to moderate centrilobular and paraseptal emphysema; imaging findings suggestive of underlying COPD. 4. Aortic atherosclerosis, in  addition to left main and 3 vessel coronary artery disease. 5. There are calcifications of the aortic valve. Echocardiographic correlation for evaluation of potential valvular dysfunction may be warranted if clinically indicated. Aortic Atherosclerosis (ICD10-I70.0). Electronically Signed   By: Vinnie Langton M.D.   On: 05/26/2018 16:53    Impression: Clinically stable. No evidence of recurrence on recent CT scan.   Plan:  Routine follow-up in 6 months.  -----------------------------------  Blair Promise, PhD, MD This document serves as a record of services personally performed by Gery Pray, MD. It was created on his behalf by Mary-Margaret Loma Messing, a trained medical scribe. The creation of this record is based on the scribe's personal observations and the provider's statements to them. This document has been checked and approved by the attending provider.

## 2018-05-29 NOTE — Progress Notes (Signed)
Pt presents today for f/u with Dr. Sondra Come. Pt is accompanied by wife. Pt denies c/o pain. Pt c/o congestion, started approximately 2-3 moths ago. Pt states cough is mostly non-productive. Pt denies hemoptysis. Pt states respiratory status is baseline with COPD.  BP 117/74 (BP Location: Left Arm, Patient Position: Sitting)   Pulse 72   Temp 97.7 F (36.5 C) (Oral)   Resp 18   Ht 5\' 11"  (1.803 m)   Wt 214 lb 2 oz (97.1 kg)   SpO2 92%   BMI 29.86 kg/m   Wt Readings from Last 3 Encounters:  05/29/18 214 lb 2 oz (97.1 kg)  04/27/18 213 lb 3.2 oz (96.7 kg)  10/24/17 210 lb 1 oz (95.3 kg)    Loma Sousa, RN BSN

## 2018-09-06 ENCOUNTER — Ambulatory Visit: Payer: Medicare Other | Admitting: Cardiology

## 2018-09-21 ENCOUNTER — Telehealth: Payer: Self-pay

## 2018-09-21 ENCOUNTER — Other Ambulatory Visit: Payer: Self-pay | Admitting: Cardiology

## 2018-09-21 DIAGNOSIS — I2510 Atherosclerotic heart disease of native coronary artery without angina pectoris: Secondary | ICD-10-CM

## 2018-09-21 DIAGNOSIS — E7800 Pure hypercholesterolemia, unspecified: Secondary | ICD-10-CM

## 2018-09-21 NOTE — Telephone Encounter (Signed)
I have put orders in

## 2018-09-21 NOTE — Telephone Encounter (Signed)
Pt's wife called and asked if her husband needs bloodwork before his appt with you coming up. They are having issues getting the blood work done at PCP office

## 2018-09-27 LAB — CMP14+EGFR
ALT: 19 IU/L (ref 0–44)
AST: 24 IU/L (ref 0–40)
Albumin/Globulin Ratio: 1.6 (ref 1.2–2.2)
Albumin: 4.6 g/dL (ref 3.7–4.7)
Alkaline Phosphatase: 65 IU/L (ref 39–117)
BUN/Creatinine Ratio: 16 (ref 10–24)
BUN: 17 mg/dL (ref 8–27)
Bilirubin Total: 0.9 mg/dL (ref 0.0–1.2)
CO2: 23 mmol/L (ref 20–29)
Calcium: 9.5 mg/dL (ref 8.6–10.2)
Chloride: 103 mmol/L (ref 96–106)
Creatinine, Ser: 1.07 mg/dL (ref 0.76–1.27)
GFR calc Af Amer: 75 mL/min/{1.73_m2} (ref >59)
GFR calc non Af Amer: 65 mL/min/{1.73_m2} (ref >59)
Globulin, Total: 2.8 g/dL (ref 1.5–4.5)
Glucose: 123 mg/dL — ABNORMAL HIGH (ref 65–99)
Potassium: 4.7 mmol/L (ref 3.5–5.2)
Sodium: 143 mmol/L (ref 134–144)
Total Protein: 7.4 g/dL (ref 6.0–8.5)

## 2018-09-27 LAB — CBC
Hematocrit: 44 % (ref 37.5–51.0)
Hemoglobin: 14.7 g/dL (ref 13.0–17.7)
MCH: 31 pg (ref 26.6–33.0)
MCHC: 33.4 g/dL (ref 31.5–35.7)
MCV: 93 fL (ref 79–97)
Platelets: 151 10*3/uL (ref 150–450)
RBC: 4.74 x10E6/uL (ref 4.14–5.80)
RDW: 12.3 % (ref 11.6–15.4)
WBC: 11.6 10*3/uL — ABNORMAL HIGH (ref 3.4–10.8)

## 2018-09-27 LAB — LIPID PANEL WITH LDL/HDL RATIO
Cholesterol, Total: 83 mg/dL — ABNORMAL LOW (ref 100–199)
HDL: 38 mg/dL — ABNORMAL LOW (ref >39)
LDL Calculated: 31 mg/dL (ref 0–99)
LDl/HDL Ratio: 0.8 ratio (ref 0.0–3.6)
Triglycerides: 71 mg/dL (ref 0–149)
VLDL Cholesterol Cal: 14 mg/dL (ref 5–40)

## 2018-09-27 LAB — TSH: TSH: 1.13 u[IU]/mL (ref 0.450–4.500)

## 2018-09-27 NOTE — Progress Notes (Signed)
Left detailed vm °

## 2018-09-27 NOTE — Progress Notes (Signed)
CBC within normal limits, except for mild elevation in white count. Kidney function, liver enzymes, electrolytes, within normal limits. Glucose elevated at 123. Lipids are well controlled.

## 2018-09-29 ENCOUNTER — Encounter: Payer: Self-pay | Admitting: Cardiology

## 2018-10-02 ENCOUNTER — Ambulatory Visit: Payer: Medicare Other | Admitting: Cardiology

## 2018-10-03 ENCOUNTER — Other Ambulatory Visit: Payer: Self-pay

## 2018-10-03 ENCOUNTER — Ambulatory Visit (INDEPENDENT_AMBULATORY_CARE_PROVIDER_SITE_OTHER): Payer: Medicare Other | Admitting: Cardiology

## 2018-10-03 ENCOUNTER — Encounter: Payer: Self-pay | Admitting: Cardiology

## 2018-10-03 VITALS — BP 128/67 | HR 70 | Ht 71.0 in | Wt 207.6 lb

## 2018-10-03 DIAGNOSIS — I2510 Atherosclerotic heart disease of native coronary artery without angina pectoris: Secondary | ICD-10-CM | POA: Diagnosis not present

## 2018-10-03 DIAGNOSIS — I6523 Occlusion and stenosis of bilateral carotid arteries: Secondary | ICD-10-CM

## 2018-10-03 DIAGNOSIS — I5042 Chronic combined systolic (congestive) and diastolic (congestive) heart failure: Secondary | ICD-10-CM

## 2018-10-03 DIAGNOSIS — Z66 Do not resuscitate: Secondary | ICD-10-CM

## 2018-10-03 DIAGNOSIS — J432 Centrilobular emphysema: Secondary | ICD-10-CM

## 2018-10-03 NOTE — Progress Notes (Signed)
Primary Physician/Referring:  Wenda Low, MD  Patient ID: [name], male    DOB: 01-Aug-1935, 83 y.o.   MRN: [id number]  Chief Complaint  Patient presents with  . Coronary Artery Disease    To discuss laboratory findings   HPI:    HPI: [name]  is a 83 y.o. male  Caucasian male with history of non-STEMI and PCI with DES to RCA in 2011 and circumflex stenting in 2013, ischemic cardiomyopathy with mildly reduced LVEF around 45% by echocardiogram on January 2018, hypertension and hyperlipidemia, asymptomatic right carotid stenosis, small 3.3 cm stable AAA since 1980s and followed by his PCP, severe COPD from prior tobacco use and presently on nocturnal and also uses oxygen when he is exerting. He has history of sleep apnea unable to tolerate CPAP multiple medication allergies, chronic back pain and spinal fusion in the past. History of lung cancer status post radiation therapy and follows Dr. Gery Pray since February 2018. He denies chest pain, dyspnea has remained stable, no PND or orthopnea prior, he does have leg cramps when he does gardening or walks more than ordinary in bilateral BUT has not been lifestyle limiting but dyspnea has been lifestyle limiting but he has learned to live with this.  There is no worsening dyspnea.  He is DNR per his wishes. He always believed that his priority is now quality of life and asserts that he does not want any extraordinary life-saving measures or CPR if he were to experience cardiac arrest.  This is his annual visit and states that he is feeling the best he has in quite a while.   His wife is listening to the conversation and also spoke to her on the telephone.  Past Medical History:  Diagnosis Date  . AAA (abdominal aortic aneurysm) (HCC)    measures 2.7 cm.  . Arthritis   . Back pain   . BPH (benign prostatic hypertrophy)   . Bruises easily   . CAD (coronary artery disease)   . Cervical spine fracture ([place]) feb 1990   C 6, C 7  and T 1, no surgery done wore halo brace  . CLL (chronic lymphocytic leukemia) ([place])    hx of worked up 2009 by dr Benay Spice, no tx done, released by dr Benay Spice  . Complication of anesthesia    cannot lie on right side gets vertigo and trouble turning neck to right  . COPD (chronic obstructive pulmonary disease) ([place]) may 2005  . Erectile dysfunction   . Fatigue   . GERD (gastroesophageal reflux disease)   . Glaucoma   . Hepatitis as child   serum hepatitis  . History of oxygen administration    oxygen  @2   l/m nasally at bedtime.  Marland Kitchen History of radiation therapy 06/22/16-06/28/16   Left upper lobe of lung/ 54 Gy in 3 fractions  . Hyperlipidemia   . Hypertension   . Impaired hearing    wears Hearing aids  . Ischemic cardiomyopathy   . Leg pain   . Lumbar disc disease   . Lung cancer ([place]) dx'd 03/18/16  . Microscopic hematuria   . Myocardial infarct ([place]) 03-17-2009   Hx MI 2000  . Neuropathy    both feet and legs  . Osteopenia   . Pneumonia    hx x2  . Thyroid nodule    Past Surgical History:  Procedure Laterality Date  . APPENDECTOMY  sept, 10, 2005  . BACK SURGERY  2009   L  2 and L 3   . benign cyst removed from left neck  02-2010  . CERVICAL DISC SURGERY  2005-2008   C 2, C 3 and C 4 2005, rods placed C 6, C 7 and T 1  . COLONOSCOPY WITH PROPOFOL N/A 11/26/2014   Procedure: COLONOSCOPY WITH PROPOFOL;  Surgeon: Garlan Fair, MD;  Location: [facility];  Service: Endoscopy;  Laterality: N/A;  . CORONARY ANGIOPLASTY WITH STENT PLACEMENT  2013   1 stent repair, one stent replaced  . ESOPHAGOGASTRODUODENOSCOPY (EGD) WITH PROPOFOL N/A 11/27/2013   Procedure: ESOPHAGOGASTRODUODENOSCOPY (EGD) WITH PROPOFOL;  Surgeon: Garlan Fair, MD;  Location: [facility];  Service: Endoscopy;  Laterality: N/A;  . EYE SURGERY Bilateral july 2011   eye surgry for glaucoma  . FUDUCIAL PLACEMENT N/A 05/12/2016   Procedure: PLACEMENT OF FUDUCIAL;  Surgeon: Melrose Nakayama, MD;   Location: [facility];  Service: Thoracic;  Laterality: N/A;  . fusion L 3 and L 4  August 15, 2008  . LEFT HEART CATHETERIZATION WITH CORONARY ANGIOGRAM N/A 08/12/2011   Procedure: LEFT HEART CATHETERIZATION WITH CORONARY ANGIOGRAM;  Surgeon: Larey Dresser, MD;  Location: [facility] CATH LAB;  Service: Cardiovascular;  Laterality: N/A;  . NASAL SINUS SURGERY   oct 2005  . right knee arthroscopy  1981  . stent top heart  2011   x 2  . TONSILLECTOMY  1943  . VIDEO BRONCHOSCOPY WITH ENDOBRONCHIAL NAVIGATION N/A 05/12/2016   Procedure: VIDEO BRONCHOSCOPY WITH ENDOBRONCHIAL NAVIGATION;  Surgeon: Melrose Nakayama, MD;  Location: [facility];  Service: Thoracic;  Laterality: N/A;   Social History   Socioeconomic History  . Marital status: Married    Spouse name: Not on file  . Number of children: 1  . Years of education: Not on file  . Highest education level: Not on file  Occupational History  . Not on file  Social Needs  . Financial resource strain: Not on file  . Food insecurity    Worry: Not on file    Inability: Not on file  . Transportation needs    Medical: Not on file    Non-medical: Not on file  Tobacco Use  . Smoking status: Former Smoker    Packs/day: 2.00    Years: 55.00    Pack years: 110.00    Types: Cigarettes    Quit date: 07/14/2006    Years since quitting: 12.2  . Smokeless tobacco: Never Used  Substance and Sexual Activity  . Alcohol use: No  . Drug use: No  . Sexual activity: Not on file  Lifestyle  . Physical activity    Days per week: Not on file    Minutes per session: Not on file  . Stress: Not on file  Relationships  . Social Herbalist on phone: Not on file    Gets together: Not on file    Attends religious service: Not on file    Active member of club or organization: Not on file    Attends meetings of clubs or organizations: Not on file    Relationship status: Not on file  . Intimate partner violence    Fear of current or ex partner: Not on file     Emotionally abused: Not on file    Physically abused: Not on file    Forced sexual activity: Not on file  Other Topics Concern  . Not on file  Social History Narrative  . Not on file   ROS  Review of Systems  Constitution: Negative for chills, decreased appetite, malaise/fatigue and weight gain.  Cardiovascular: Negative for dyspnea on exertion, leg swelling and syncope.  Respiratory: Positive for shortness of breath. Negative for cough, hemoptysis and sputum production.   Endocrine: Negative for cold intolerance.  Hematologic/Lymphatic: Does not bruise/bleed easily.  Musculoskeletal: Positive for back pain (multiple back surgeries) and joint pain. Negative for joint swelling.  Gastrointestinal: Negative for abdominal pain, anorexia, change in bowel habit, hematochezia and melena.  Neurological: Positive for paresthesias (feet bilateral). Negative for headaches and light-headedness.  Psychiatric/Behavioral: Negative for depression and substance abuse.  All other systems reviewed and are negative.  Objective  Blood pressure 128/67, pulse 70, height 5\' 11"  (1.803 m), weight 207 lb 9.6 oz (94.2 kg), SpO2 (!) 88 %. Body mass index is 28.95 kg/m.   Physical Exam  Constitutional: He appears well-developed and well-nourished. No distress.  HENT:  Head: Atraumatic.  Eyes: Conjunctivae are normal.  Neck: Neck supple. No JVD present. No thyromegaly present.  Cardiovascular: Normal rate, regular rhythm and normal heart sounds. Exam reveals no gallop.  No murmur heard. Pulses:      Carotid pulses are on the right side with bruit and on the left side with bruit.      Femoral pulses are 2+ on the right side and 2+ on the left side.      Popliteal pulses are 1+ on the right side and 1+ on the left side.       Dorsalis pedis pulses are 0 on the right side and 0 on the left side.       Posterior tibial pulses are 0 on the right side and 0 on the left side.  No leg edema. No JVD   Pulmonary/Chest: Effort normal. No respiratory distress. He has no wheezes.  Prolonged expiration bilateral. Barrel-shaped chest  Abdominal: Soft. Bowel sounds are normal.  Musculoskeletal: Normal range of motion.  Neurological: He is alert.  Skin: Skin is warm and dry.  Psychiatric: He has a normal mood and affect.   Radiology: No results found.  Laboratory examination:   CMP Latest Ref Rng & Units 09/26/2018 06/22/2016 05/07/2016  Glucose 65 - 99 mg/dL 123(H) - 146(H)  BUN 8 - 27 mg/dL 17 11.8 13  Creatinine 0.76 - 1.27 mg/dL 1.07 1.0 1.13  Sodium 134 - 144 mmol/L 143 - 143  Potassium 3.5 - 5.2 mmol/L 4.7 - 5.0  Chloride 96 - 106 mmol/L 103 - 105  CO2 20 - 29 mmol/L 23 - 31  Calcium 8.6 - 10.2 mg/dL 9.5 - 9.9  Total Protein 6.0 - 8.5 g/dL 7.4 - 7.1  Total Bilirubin 0.0 - 1.2 mg/dL 0.9 - 1.4(H)  Alkaline Phos 39 - 117 IU/L 65 - 72  AST 0 - 40 IU/L 24 - 24  ALT 0 - 44 IU/L 19 - 21   CBC Latest Ref Rng & Units 09/26/2018 05/07/2016 08/21/2011  WBC 3.4 - 10.8 x10E3/uL 11.6(H) 9.5 10.9(H)  Hemoglobin 13.0 - 17.7 g/dL 14.7 14.3 15.0  Hematocrit 37.5 - 51.0 % 44.0 43.0 43.2  Platelets 150 - 450 x10E3/uL 151 159 186   Lipid Panel     Component Value Date/Time   CHOL 83 (L) 09/26/2018 1013   TRIG 71 09/26/2018 1013   HDL 38 (L) 09/26/2018 1013   CHOLHDL 2 03/20/2014 1002   VLDL 20.0 03/20/2014 1002   LDLCALC 31 09/26/2018 1013   HEMOGLOBIN A1C No results found for: HGBA1C, MPG TSH Recent Labs  09/26/18 1013  TSH 1.130   Medications   Current Outpatient Medications  Medication Instructions  . albuterol (PROAIR HFA) 108 (90 BASE) MCG/ACT inhaler 2 puffs, Inhalation, Every 6 hours PRN  . albuterol (PROVENTIL) 2.5 mg, Nebulization, Every 6 hours PRN  . alendronate (FOSAMAX) 70 mg, Oral, Weekly, Take with a full glass of water on an empty stomach.  Marland Kitchen aspirin 81 mg, Oral, Daily  . atorvastatin (LIPITOR) 20 MG tablet Daily  . CALCIUM-MAGNESIUM-VITAMIN D PO 1 tablet, Oral,  Daily  . dorzolamide-timolol (COSOPT) 22.3-6.8 MG/ML ophthalmic solution 1 drop, Both Eyes, 2 times daily  . DUREZOL 0.05 % EMUL No dose, route, or frequency recorded.  . gabapentin (NEURONTIN) 600 mg, Oral, 2 times daily  . latanoprost (XALATAN) 0.005 % ophthalmic solution 1 drop, Both Eyes, Daily at bedtime  . methocarbamol (ROBAXIN) 750 mg, Oral, As needed  . metoprolol succinate (TOPROL-XL) 25 mg, Oral,  Every morning - 10a  . nitroGLYCERIN (NITROSTAT) 0.4 mg, Sublingual, Every 5 min PRN, For chest pain  . OXYGEN 2lpm with sleep only   . tamsulosin (FLOMAX) 0.4 mg, Oral, Every evening    Cardiac Studies:   Coronary Angiography  PCI  In 2011 (DES to RCA) and [place] and PCI in 07/2011 (DES to proximal CX and balloon angioplasty to proximal RCA in-stent restenosis),  Abdominal Aortic Duplex  07/18/2015: 3.3 cm distal abdominal aortic aneurysm with aneurysmal dilatation over 6 cm length noted. Similar findings noted on priors study. Recommend followup by [place] in 3 years.   Echocardiogram 03/23/2016: Poor echo window. Wall motion abnormality has reduced sensitivity. Left ventricle cavity is normal in size. Mild Diffuse hypokinesis Mild concentric hypertrophy of the left ventricle. Mild decrease in LV systolic function. Grade I diastolic dysfunction. Visual EF is 45%. Calculated EF 38%. Right ventricle cavity is borderline dilated. Normal right ventricular function. Trace tricuspid regurgitation. No evidence of pulmonary hypertension.  Carotid artery duplex 03/27/2018: Moderate amount of plaque at the carotid bulbs and internal carotid arteries, right side greater than left. Estimated degree of stenosis in the internal carotid arteries is less than 50% bilaterally. Patent vertebral arteries with antegrade flow.  Assessment     ICD-10-CM   1. Coronary artery disease involving native coronary artery of native heart without angina pectoris  I25.10    2013 -PCI to circumflex and balloon  angioplasty to ISR RCA  2. Chronic combined systolic and diastolic heart failure (HCC)  I50.42   3. Bilateral carotid artery stenosis  I65.23   4. Centrilobular emphysema ([place])  J43.2   5. DNR (do not resuscitate)  Z66     EKG 04/27/2018: Normal sinus rhythm at the rate of 68 bpm, normal axis, cannot exclude true posterior infarct old.  Nonspecific T abnormality.  Recommendations:   Patient with known coronary artery disease and ischemic cardio myopathy with mild to moderate LV systolic dysfunction who is now well compensated and there is no clinical evidence of congestive heart failure.  He has not had any recurrence of angina pectoris. He has previously tried Ace inhibitors and also Entresto but had severe drop in blood pressure and does not want to restart the medication.  His carotid artery duplex surveillance done in Jan 2020 reveals mild disease only. Will consider repeat scan in 1 year. AAA is being followed by PCP.  I reviewed his labs, lipids are under excellent control, renal function is normal along with LFTs, CBC is also normal. Patient received a copy of the labs.  After patient's  visit, I realized that he always stated that he doesn't want to be resuscitated in case of cardiac arrest.  I will discuss with him again and confirm if he has a living will will obtain a copy and document this.  But he has expressed DO NOT RESUSCITATE at several occasions and I passed office visits.  His wife was also present during this discussion in the past.   Adrian Prows, MD, [facility] 10/04/2018, 5:42 AM [facility] [place] Pager: [phone] Office: [phone] If no answer Cell [phone]

## 2018-10-05 ENCOUNTER — Telehealth: Payer: Self-pay

## 2018-10-05 NOTE — Telephone Encounter (Signed)
Needs to schedule nurse visit with JG to to DNR

## 2018-11-13 ENCOUNTER — Telehealth: Payer: Self-pay | Admitting: *Deleted

## 2018-11-13 NOTE — Telephone Encounter (Signed)
CALLED PATIENT TO RESCHEDULE FU ON 11-30-18 DUE TO DR. KINARD BEING ON VACATION, RESCHEDULED FOR 12-07-18 @ 9:15 AM, LVM FOR A RETURN CALL

## 2018-11-30 ENCOUNTER — Ambulatory Visit: Payer: Self-pay | Admitting: Radiation Oncology

## 2018-12-07 ENCOUNTER — Ambulatory Visit
Admission: RE | Admit: 2018-12-07 | Discharge: 2018-12-07 | Disposition: A | Discharge status: Home / Self Care | Payer: Medicare Other | Source: Ambulatory Visit | Attending: Radiation Oncology | Admitting: Radiation Oncology

## 2018-12-07 ENCOUNTER — Other Ambulatory Visit: Payer: Self-pay

## 2018-12-07 ENCOUNTER — Encounter: Payer: Self-pay | Admitting: Radiation Oncology

## 2018-12-07 VITALS — BP 116/74 | HR 60 | Temp 98.0°F | Resp 18 | Ht 71.0 in | Wt 199.0 lb

## 2018-12-07 DIAGNOSIS — Z7982 Long term (current) use of aspirin: Secondary | ICD-10-CM | POA: Diagnosis not present

## 2018-12-07 DIAGNOSIS — Z85118 Personal history of other malignant neoplasm of bronchus and lung: Secondary | ICD-10-CM | POA: Diagnosis present

## 2018-12-07 DIAGNOSIS — C3492 Malignant neoplasm of unspecified part of left bronchus or lung: Secondary | ICD-10-CM

## 2018-12-07 DIAGNOSIS — Z923 Personal history of irradiation: Secondary | ICD-10-CM | POA: Diagnosis not present

## 2018-12-07 DIAGNOSIS — Z79899 Other long term (current) drug therapy: Secondary | ICD-10-CM | POA: Insufficient documentation

## 2018-12-07 NOTE — Progress Notes (Signed)
Radiation Oncology         [phone] ________________________________  Name: [name] MRN: [id number]  Date: 12/07/2018  DOB: 05/31/1935  Follow-Up Visit Note  CC: Omar Low, MD  [name], *    ICD-10-CM   1. Non-small cell carcinoma of left lung, stage 1 ([place])  C34.92     Diagnosis:    Clinical stage I, non-small cell lung cancer presenting in the left upper lobe  Interval Since Last Radiation:  2 years, 5 months   06/22/16-06/28/16: LUL / 54 Gy in 3 fractions (SBRT)  Narrative:  Patient returns today for routine follow up.   Following his last visit, he underwent repeat chest CT on 05/26/2018, which was stable.  On review of systems, he notes recent chest soreness from recent fall while remodeling at his house.  No x-rays were taken.  He denies any change in breathing any significant cough or hemoptysis.  ALLERGIES:  is allergic to entresto [sacubitril-valsartan]; iodinated diagnostic agents; penicillins; lumigan [bimatoprost]; lyrica [pregabalin]; sulfonamide derivatives; symbicort [budesonide-formoterol fumarate]; valsartan; advair diskus [fluticasone-salmeterol]; bacitracin; brimonidine tartrate; ciprofloxacin; flovent diskus [fluticasone propionate (inhal)]; pulmicort [budesonide]; qvar [beclomethasone]; spiriva [tiotropium bromide monohydrate]; tessalon [benzonatate]; adhesive [tape]; flonase [fluticasone propionate]; and relafen [nabumetone].  Meds: Current Outpatient Medications  Medication Sig Dispense Refill  . albuterol (PROAIR HFA) 108 (90 BASE) MCG/ACT inhaler Inhale 2 puffs into the lungs every 6 (six) hours as needed for wheezing or shortness of breath.     Marland Kitchen albuterol (PROVENTIL) (2.5 MG/3ML) 0.083% nebulizer solution Take 2.5 mg by nebulization every 6 (six) hours as needed for wheezing or shortness of breath.    Marland Kitchen alendronate (FOSAMAX) 70 MG tablet Take 70 mg by mouth once a week. Take with a full glass of water on an empty stomach.     Marland Kitchen aspirin 81 MG tablet Take 81 mg by mouth daily.     Marland Kitchen atorvastatin (LIPITOR) 20 MG tablet daily.    Marland Kitchen CALCIUM-MAGNESIUM-VITAMIN D PO Take 1 tablet by mouth daily.    . dorzolamide-timolol (COSOPT) 22.3-6.8 MG/ML ophthalmic solution Place 1 drop into both eyes 2 (two) times daily.    Marland Kitchen gabapentin (NEURONTIN) 300 MG capsule Take 600 mg by mouth 2 (two) times daily.     Marland Kitchen latanoprost (XALATAN) 0.005 % ophthalmic solution Place 1 drop into both eyes at bedtime.     . methocarbamol (ROBAXIN) 750 MG tablet Take 750 mg by mouth as needed for muscle spasms.    . metoprolol succinate (TOPROL-XL) 25 MG 24 hr tablet Take 25 mg by mouth every morning.     . nitroGLYCERIN (NITROSTAT) 0.4 MG SL tablet Place 0.4 mg under the tongue every 5 (five) minutes as needed for chest pain. For chest pain     . OXYGEN 2lpm with sleep only    . Tamsulosin HCl (FLOMAX) 0.4 MG CAPS Take 0.4 mg by mouth every evening.      No current facility-administered medications for this encounter.     Physical Findings: The patient is in no acute distress. Patient is alert and oriented.  height is 5\' 11"  (1.803 m) and weight is 199 lb (90.3 kg). His temperature is 98 F (36.7 C). His blood pressure is 116/74 and his pulse is 60. His respiration is 18 and oxygen saturation is 96%. .  No significant changes.  Lungs are clear to auscultation bilaterally. Heart has regular rate and rhythm. No palpable cervical, supraclavicular, or axillary adenopathy. Abdomen soft, non-tender, normal bowel sounds.  Lab Findings: Lab Results  Component Value Date   WBC 11.6 (H) 09/26/2018   HGB 14.7 09/26/2018   HCT 44.0 09/26/2018   MCV 93 09/26/2018   PLT 151 09/26/2018    Radiographic Findings: No results found.  Impression: Clinically stable.  No evidence of recurrence on clinical exam today.  Plan:  Routine follow-up in 6 months.  I placed an order for CT scan of the chest next week. -----------------------------------  Blair Promise, PhD, MD  This document serves as a record of services personally performed by Gery Pray, MD. It was created on his behalf by Wilburn Mylar, a trained medical scribe. The creation of this record is based on the scribe's personal observations and the provider's statements to them. This document has been checked and approved by the attending provider.

## 2018-12-07 NOTE — Progress Notes (Signed)
Patient in for follow up doing well. Denies any issues or complaints. Patient states he does have increasing congestion and is having some intermitent pain under shoulder blades but did have a fall 6 weeks ago. No x-rays were taken.

## 2018-12-07 NOTE — Patient Instructions (Signed)
Coronavirus (COVID-19) Are you at risk?  Are you at risk for the Coronavirus (COVID-19)?  To be considered HIGH RISK for Coronavirus (COVID-19), you have to meet the following criteria:  . Traveled to [place], [place], [place], [place] or [place]; or in the [place] to [place], [place], [place], or [place]; and have fever, cough, and shortness of breath within the last 2 weeks of travel OR . Been in close contact with a person diagnosed with COVID-19 within the last 2 weeks and have fever, cough, and shortness of breath . IF YOU DO NOT MEET THESE CRITERIA, YOU ARE CONSIDERED LOW RISK FOR COVID-19.  What to do if you are HIGH RISK for COVID-19?  . If you are having a medical emergency, call 911. . Seek medical care right away. Before you go to a doctor's office, urgent care or emergency department, call ahead and tell them about your recent travel, contact with someone diagnosed with COVID-19, and your symptoms. You should receive instructions from your physician's office regarding next steps of care.  . When you arrive at healthcare provider, tell the healthcare staff immediately you have returned from visiting [place], [place], [place], [place] or [place]; or traveled in the [place] to [place], [place], [place], or [place]; in the last two weeks or you have been in close contact with a person diagnosed with COVID-19 in the last 2 weeks.   . Tell the health care staff about your symptoms: fever, cough and shortness of breath. . After you have been seen by a medical provider, you will be either: o Tested for (COVID-19) and discharged home on quarantine except to seek medical care if symptoms worsen, and asked to  - Stay home and avoid contact with others until you get your results (4-5 days)  - Avoid travel on public transportation if possible (such as bus, train, or airplane) or o Sent to the Emergency Department by EMS for evaluation, COVID-19 testing, and possible  admission depending on your condition and test results.  What to do if you are LOW RISK for COVID-19?  Reduce your risk of any infection by using the same precautions used for avoiding the common cold or flu:  . Wash your hands often with soap and warm water for at least 20 seconds.  If soap and water are not readily available, use an alcohol-based hand sanitizer with at least 60% alcohol.  . If coughing or sneezing, cover your mouth and nose by coughing or sneezing into the elbow areas of your shirt or coat, into a tissue or into your sleeve (not your hands). . Avoid shaking hands with others and consider head nods or verbal greetings only. . Avoid touching your eyes, nose, or mouth with unwashed hands.  . Avoid close contact with people who are sick. . Avoid places or events with large numbers of people in one location, like concerts or sporting events. . Carefully consider travel plans you have or are making. . If you are planning any travel outside or inside the [place], visit the CDC's Travelers' Health webpage for the latest health notices. . If you have some symptoms but not all symptoms, continue to monitor at home and seek medical attention if your symptoms worsen. . If you are having a medical emergency, call 911.   ADDITIONAL HEALTHCARE OPTIONS FOR PATIENTS  [place] / e-Visit: [email or web address]         [facility]: [phone]  [place]   [facility]: [phone]                   [facility]: [phone]   

## 2018-12-20 ENCOUNTER — Telehealth: Payer: Self-pay | Admitting: *Deleted

## 2018-12-20 NOTE — Telephone Encounter (Signed)
CALLED PATIENT TO INFORM OF CT FOR 12-25-18 - ARRIVAL TIME- 12:45 PM @ [facility], NO RESTRICTIONS TO TEST, AND PATIENT TO FU WITH DR. KINARD ON 12-28-18 FOR RESULTS, SPOKE WITH PATIENT'S WIFE- PHYLLIS AND SHE IS AWARE OF THESE APPTS.

## 2018-12-25 ENCOUNTER — Encounter (HOSPITAL_COMMUNITY): Payer: Self-pay

## 2018-12-25 ENCOUNTER — Telehealth: Payer: Self-pay | Admitting: *Deleted

## 2018-12-25 ENCOUNTER — Other Ambulatory Visit: Payer: Self-pay

## 2018-12-25 ENCOUNTER — Ambulatory Visit (HOSPITAL_COMMUNITY)
Admission: RE | Admit: 2018-12-25 | Discharge: 2018-12-25 | Disposition: A | Discharge status: Home / Self Care | Payer: Medicare Other | Source: Ambulatory Visit | Attending: Radiation Oncology | Admitting: Radiation Oncology

## 2018-12-25 DIAGNOSIS — C3492 Malignant neoplasm of unspecified part of left bronchus or lung: Secondary | ICD-10-CM | POA: Insufficient documentation

## 2018-12-25 NOTE — Telephone Encounter (Signed)
RETURNED PATIENT'S WIFE- PHYLLIS Griess'S PHONE CALL, SPOKE WITH PATIENT'S WIFE, [place]

## 2018-12-28 ENCOUNTER — Other Ambulatory Visit: Payer: Self-pay

## 2018-12-28 ENCOUNTER — Ambulatory Visit
Admission: RE | Admit: 2018-12-28 | Discharge: 2018-12-28 | Disposition: A | Discharge status: Home / Self Care | Payer: Medicare Other | Source: Ambulatory Visit | Attending: Radiation Oncology | Admitting: Radiation Oncology

## 2018-12-28 ENCOUNTER — Encounter: Payer: Self-pay | Admitting: Radiation Oncology

## 2018-12-28 VITALS — BP 128/82 | HR 60 | Temp 98.3°F | Resp 18 | Ht 71.0 in | Wt 201.0 lb

## 2018-12-28 DIAGNOSIS — C3492 Malignant neoplasm of unspecified part of left bronchus or lung: Secondary | ICD-10-CM

## 2018-12-28 DIAGNOSIS — I2510 Atherosclerotic heart disease of native coronary artery without angina pectoris: Secondary | ICD-10-CM | POA: Insufficient documentation

## 2018-12-28 DIAGNOSIS — Z79899 Other long term (current) drug therapy: Secondary | ICD-10-CM | POA: Insufficient documentation

## 2018-12-28 DIAGNOSIS — Z85118 Personal history of other malignant neoplasm of bronchus and lung: Secondary | ICD-10-CM | POA: Diagnosis present

## 2018-12-28 DIAGNOSIS — M8580 Other specified disorders of bone density and structure, unspecified site: Secondary | ICD-10-CM | POA: Diagnosis not present

## 2018-12-28 DIAGNOSIS — R911 Solitary pulmonary nodule: Secondary | ICD-10-CM | POA: Diagnosis not present

## 2018-12-28 DIAGNOSIS — N62 Hypertrophy of breast: Secondary | ICD-10-CM | POA: Diagnosis not present

## 2018-12-28 DIAGNOSIS — I700 Atherosclerosis of aorta: Secondary | ICD-10-CM | POA: Insufficient documentation

## 2018-12-28 DIAGNOSIS — Z7982 Long term (current) use of aspirin: Secondary | ICD-10-CM | POA: Diagnosis not present

## 2018-12-28 NOTE — Patient Instructions (Signed)
Coronavirus (COVID-19) Are you at risk?  Are you at risk for the Coronavirus (COVID-19)?  To be considered HIGH RISK for Coronavirus (COVID-19), you have to meet the following criteria:  . Traveled to [place], [place], [place], [place] or [place]; or in the [place] to [place], [place], [place], or [place]; and have fever, cough, and shortness of breath within the last 2 weeks of travel OR . Been in close contact with a person diagnosed with COVID-19 within the last 2 weeks and have fever, cough, and shortness of breath . IF YOU DO NOT MEET THESE CRITERIA, YOU ARE CONSIDERED LOW RISK FOR COVID-19.  What to do if you are HIGH RISK for COVID-19?  . If you are having a medical emergency, call 911. . Seek medical care right away. Before you go to a doctor's office, urgent care or emergency department, call ahead and tell them about your recent travel, contact with someone diagnosed with COVID-19, and your symptoms. You should receive instructions from your physician's office regarding next steps of care.  . When you arrive at healthcare provider, tell the healthcare staff immediately you have returned from visiting [place], [place], [place], [place] or [place]; or traveled in the [place] to [place], [place], [place], or [place]; in the last two weeks or you have been in close contact with a person diagnosed with COVID-19 in the last 2 weeks.   . Tell the health care staff about your symptoms: fever, cough and shortness of breath. . After you have been seen by a medical provider, you will be either: o Tested for (COVID-19) and discharged home on quarantine except to seek medical care if symptoms worsen, and asked to  - Stay home and avoid contact with others until you get your results (4-5 days)  - Avoid travel on public transportation if possible (such as bus, train, or airplane) or o Sent to the Emergency Department by EMS for evaluation, COVID-19 testing, and possible  admission depending on your condition and test results.  What to do if you are LOW RISK for COVID-19?  Reduce your risk of any infection by using the same precautions used for avoiding the common cold or flu:  . Wash your hands often with soap and warm water for at least 20 seconds.  If soap and water are not readily available, use an alcohol-based hand sanitizer with at least 60% alcohol.  . If coughing or sneezing, cover your mouth and nose by coughing or sneezing into the elbow areas of your shirt or coat, into a tissue or into your sleeve (not your hands). . Avoid shaking hands with others and consider head nods or verbal greetings only. . Avoid touching your eyes, nose, or mouth with unwashed hands.  . Avoid close contact with people who are sick. . Avoid places or events with large numbers of people in one location, like concerts or sporting events. . Carefully consider travel plans you have or are making. . If you are planning any travel outside or inside the [place], visit the CDC's Travelers' Health webpage for the latest health notices. . If you have some symptoms but not all symptoms, continue to monitor at home and seek medical attention if your symptoms worsen. . If you are having a medical emergency, call 911.   ADDITIONAL HEALTHCARE OPTIONS FOR PATIENTS  [facility] Tele[facility] / e-Visit: [email or web address]         [facility]: [phone]  [place]   [facility]: [phone]                   [facility]: [phone]   

## 2018-12-28 NOTE — Progress Notes (Signed)
Radiation Oncology         (336) [phone] ________________________________  Name: [name] MRN: [id number]  Date: 12/28/2018  DOB: 20-Jun-1935  Follow-Up Visit Note  CC: Wenda Low, MD  Melrose Nakayama, *    ICD-10-CM   1. Non-small cell carcinoma of left lung, stage 1 ([facility])  C34.92 NM PET Image Restag (PS) Skull Base To Thigh    Diagnosis:    Clinical stage I, non-small cell lung cancer presenting in the left upper lobe  Interval Since Last Radiation:  2 years, 6 months   06/22/16-06/28/16: LUL / 54 Gy in 3 fractions (SBRT)  Narrative:  Patient returns today to review his recent scan.  He underwent repeat chest CT on 12/25/2018, which showed: increased definition of a soft tissue density at the cephalad aspect of the radiation fiducials in the LUL, highly suspicious for locally recurrent disease; no thoracic adenopathy; osteopenia with interval T4 and anterior right rib fractures.  On review of systems, he reports rare congestion, which produces clear sputum. He denies pain, cough, hemoptysis, difficulty swallowing, and change in his shortness of breath.  ALLERGIES:  is allergic to entresto [sacubitril-valsartan]; iodinated diagnostic agents; penicillins; lumigan [bimatoprost]; lyrica [pregabalin]; sulfonamide derivatives; symbicort [budesonide-formoterol fumarate]; valsartan; advair diskus [fluticasone-salmeterol]; bacitracin; brimonidine tartrate; ciprofloxacin; flovent diskus [fluticasone propionate (inhal)]; pulmicort [budesonide]; qvar [beclomethasone]; spiriva [tiotropium bromide monohydrate]; tessalon [benzonatate]; adhesive [tape]; flonase [fluticasone propionate]; and relafen [nabumetone].  Meds: Current Outpatient Medications  Medication Sig Dispense Refill   albuterol (PROAIR HFA) 108 (90 BASE) MCG/ACT inhaler Inhale 2 puffs into the lungs every 6 (six) hours as needed for wheezing or shortness of breath.      albuterol (PROVENTIL) (2.5 MG/3ML) 0.083%  nebulizer solution Take 2.5 mg by nebulization every 6 (six) hours as needed for wheezing or shortness of breath.     alendronate (FOSAMAX) 70 MG tablet Take 70 mg by mouth once a week. Take with a full glass of water on an empty stomach.     aspirin 81 MG tablet Take 81 mg by mouth daily.      atorvastatin (LIPITOR) 20 MG tablet daily.     CALCIUM-MAGNESIUM-VITAMIN D PO Take 1 tablet by mouth daily.     dorzolamide-timolol (COSOPT) 22.3-6.8 MG/ML ophthalmic solution Place 1 drop into both eyes 2 (two) times daily.     gabapentin (NEURONTIN) 300 MG capsule Take 600 mg by mouth 2 (two) times daily.      latanoprost (XALATAN) 0.005 % ophthalmic solution Place 1 drop into both eyes at bedtime.      methocarbamol (ROBAXIN) 750 MG tablet Take 750 mg by mouth as needed for muscle spasms.     metoprolol succinate (TOPROL-XL) 25 MG 24 hr tablet Take 25 mg by mouth every morning.      nitroGLYCERIN (NITROSTAT) 0.4 MG SL tablet Place 0.4 mg under the tongue every 5 (five) minutes as needed for chest pain. For chest pain      OXYGEN 2lpm with sleep only     Tamsulosin HCl (FLOMAX) 0.4 MG CAPS Take 0.4 mg by mouth every evening.      No current facility-administered medications for this encounter.     Physical Findings: The patient is in no acute distress. Patient is alert and oriented.  height is 5\' 11"  (1.803 m) and weight is 201 lb (91.2 kg). His temporal temperature is 98.3 F (36.8 C). His blood pressure is 128/82 and his pulse is 60. His respiration is 18 and oxygen saturation is  97%. .  No significant changes.  Lungs are clear to auscultation bilaterally. Heart has regular rate and rhythm. No palpable cervical, supraclavicular, or axillary adenopathy. Abdomen soft, non-tender, normal bowel sounds.  Lab Findings: Lab Results  Component Value Date   WBC 11.6 (H) 09/26/2018   HGB 14.7 09/26/2018   HCT 44.0 09/26/2018   MCV 93 09/26/2018   PLT 151 09/26/2018    Radiographic  Findings: Ct Chest Wo Contrast  Result Date: 12/25/2018 CLINICAL DATA:  Non-small-cell lung cancer, status post therapy. Evaluate treatment . Radiation therapy completed in 2008. COPD. Shortness of breath on exertion. EXAM: CT CHEST WITHOUT CONTRAST TECHNIQUE: Multidetector CT imaging of the chest was performed following the standard protocol without IV contrast. COMPARISON:  05/26/2018 FINDINGS: Cardiovascular: Aortic atherosclerosis. Tortuous thoracic aorta. Normal heart size, without pericardial effusion. Lipomatous hypertrophy of the interatrial septum. Multivessel coronary artery atherosclerosis. Pulmonary artery enlargement, outflow tract 3.6 cm. Mediastinum/Nodes: No supraclavicular adenopathy. No mediastinal or definite hilar adenopathy, given limitations of unenhanced CT. Lungs/Pleura: No pleural fluid. Moderate centrilobular emphysema with presumed scarring in the right upper lobe and apex. There is also right lower lobe and right middle lobe scarring. Pleural-based right lower lobe nodule of 1.3 x 0.7 cm is unchanged on 122/7, favored to be benign. Subpleural 2 mm right lower lobe pulmonary nodule on 103/7 is unchanged. Just inferior and anterior this are new clustered nodules within the anterolateral right lower lobe with a "tree-in-bud" morphology, including on 105/7. Left anterior upper lobe pleural-based soft tissue density about the superior aspect of fiducial markers. More well-defined today, including at 2.3 x 1.8 cm on 30/7. Compare 2.8 x 1.7 cm back on 03/22/2018. Upper Abdomen: Normal imaged portions of the liver, spleen, stomach, pancreas, gallbladder, adrenal glands. Mild renal cortical thinning bilaterally. Low-density bilateral renal lesions are likely cysts. Abdominal aortic atherosclerosis. Musculoskeletal: Bilateral gynecomastia. Osteopenia. Fourth and fifth right rib sclerosis is new, likely due to interval healed fractures. Lower cervical spine fixation. T1 mild compression  deformity is unchanged. A T4 superior endplate mild compression deformity is new, without ventral canal encroachment. IMPRESSION: 1. Increased definition of a soft tissue density at the cephalad aspect of the radiation fiducials in the left upper lobe, which when combined with hypermetabolism on the PET of 11/17/2017, is highly suspicious for locally recurrent disease. 2. No thoracic adenopathy. 3. Aortic atherosclerosis (ICD10-I70.0), coronary artery atherosclerosis and emphysema (ICD10-J43.9). 4. Bilateral gynecomastia. 5. Osteopenia with interval T4 and anterior right rib fractures. Electronically Signed   By: Abigail Miyamoto M.D.   On: 12/25/2018 14:41    Impression: Clinically stable.  No evidence of recurrence on clinical exam today.  Recent chest CT scan is concerning for possible recurrence.  I would recommend PET CT scan for further evaluation.  Patient is in agreement.  This has been been ordered today.  Plan:  Routine follow-up in 6 months, unless PET scan shows concerning findings. -----------------------------------  Blair Promise, PhD, MD  This document serves as a record of services personally performed by Gery Pray, MD. It was created on his behalf by Wilburn Mylar, a trained medical scribe. The creation of this record is based on the scribe's personal observations and the provider's statements to them. This document has been checked and approved by the attending provider.

## 2018-12-28 NOTE — Progress Notes (Signed)
Pt presents today for f/u with Dr. Sondra Come. Pt denies c/o pain. Pt denies cough, hemoptysis. Pt reports rare congestion and produces clear sputum. Pt reports SOB has not changed/is at baseline. Pt denies difficulty swallowing.   BP 128/82 (BP Location: Left Arm, Patient Position: Sitting)   Pulse 60   Temp 98.3 F (36.8 C) (Temporal)   Resp 18   Ht 5\' 11"  (1.803 m)   Wt 201 lb (91.2 kg)   SpO2 97%   BMI 28.03 kg/m   Wt Readings from Last 3 Encounters:  12/28/18 201 lb (91.2 kg)  12/07/18 199 lb (90.3 kg)  10/03/18 207 lb 9.6 oz (94.2 kg)   Loma Sousa, RN BSN

## 2019-01-02 ENCOUNTER — Telehealth: Payer: Self-pay | Admitting: *Deleted

## 2019-01-02 NOTE — Telephone Encounter (Signed)
CALLED PATIENT TO INFORM OF PET SCAN FOR 01-10-19 - ARRIVAL TIME- 11:30 AM @ [facility], PATIENT TO BE NPO- 6 HRS,. PRIOR TO TEST, PATIENT TO NOT SUCK ANY HARD CANDY OR CHEW ANY GUM, PATIENT DECLINED APPT. ON 01-09-19 FOR PET SCAN, SPOKE WITH PATIENT'S WIFE- PHYLLIS [place] AND SHE IS AWARE OF THIS TEST.

## 2019-01-09 ENCOUNTER — Ambulatory Visit (HOSPITAL_COMMUNITY): Payer: Medicare Other

## 2019-01-10 ENCOUNTER — Encounter (HOSPITAL_COMMUNITY)
Admission: RE | Admit: 2019-01-10 | Discharge: 2019-01-10 | Disposition: A | Discharge status: Home / Self Care | Payer: Medicare Other | Source: Ambulatory Visit | Attending: Radiation Oncology | Admitting: Radiation Oncology

## 2019-01-10 ENCOUNTER — Other Ambulatory Visit: Payer: Self-pay

## 2019-01-10 DIAGNOSIS — Z79899 Other long term (current) drug therapy: Secondary | ICD-10-CM | POA: Diagnosis not present

## 2019-01-10 DIAGNOSIS — C3492 Malignant neoplasm of unspecified part of left bronchus or lung: Secondary | ICD-10-CM

## 2019-01-10 LAB — GLUCOSE, CAPILLARY: Glucose-Capillary: 117 mg/dL — ABNORMAL HIGH (ref 70–99)

## 2019-01-10 MED ORDER — FLUDEOXYGLUCOSE F - 18 (FDG) INJECTION
10.2000 | Freq: Once | INTRAVENOUS | Status: AC | PRN
  Administered 2019-01-10: 10.2 via INTRAVENOUS

## 2019-01-22 ENCOUNTER — Telehealth: Payer: Self-pay | Admitting: Internal Medicine

## 2019-01-22 NOTE — Telephone Encounter (Signed)
Spoke with patient to confirm upcoming appointment for 11/12, patient voiced understanding off arrival time and location, letter will be sent

## 2019-01-23 ENCOUNTER — Encounter: Payer: Self-pay | Admitting: *Deleted

## 2019-01-23 ENCOUNTER — Telehealth: Payer: Self-pay | Admitting: *Deleted

## 2019-01-23 DIAGNOSIS — C3492 Malignant neoplasm of unspecified part of left bronchus or lung: Secondary | ICD-10-CM

## 2019-01-23 NOTE — Telephone Encounter (Signed)
Oncology Nurse Navigator Documentation  Oncology Nurse Navigator Flowsheets 01/23/2019  Navigator Location [facility]  Referral Date to RadOnc/MedOnc -  Navigator Encounter Type Telephone/I received a message from rad onc that patient does not need an appt with Dr. Julien Nordmann just cancer conference discussion. I called patient to update but was unable to reach.  I did leave him a vm message on appt cancellation and if he had any questions or concerns to call with my name and phone number.   Telephone Outgoing Call  Treatment Phase -  Barriers/Navigation Needs Coordination of Care;Education  Education Other  Interventions Coordination of Care;Education  Acuity Level 2-Minimal Needs (1-2 Barriers Identified)  Coordination of Care Other  Education Method Verbal  Time Spent with Patient 30

## 2019-01-23 NOTE — Progress Notes (Signed)
Oncology Nurse Navigator Documentation  Oncology Nurse Navigator Flowsheets 01/23/2019  Navigator Location [facility]  Referral Date to RadOnc/MedOnc -  Navigator Encounter Type Telephone/I received a message from patient's wife stating she would like Dr. Sondra Come to call her with an update on PET scan.  I updated Dr. Sondra Come.   Telephone Outgoing Call  Treatment Phase -  Barriers/Navigation Needs Coordination of Care  Education -  Interventions Coordination of Care  Acuity Level 2-Minimal Needs (1-2 Barriers Identified)  Coordination of Care Other  Education Method -  Time Spent with Patient 15

## 2019-01-25 ENCOUNTER — Ambulatory Visit: Payer: Medicare Other | Admitting: Internal Medicine

## 2019-01-25 ENCOUNTER — Other Ambulatory Visit: Payer: Medicare Other

## 2019-01-25 ENCOUNTER — Encounter: Payer: Self-pay | Admitting: *Deleted

## 2019-01-25 ENCOUNTER — Other Ambulatory Visit: Payer: Self-pay | Admitting: *Deleted

## 2019-01-25 NOTE — Progress Notes (Signed)
Oncology Nurse Navigator Documentation  Oncology Nurse Navigator Flowsheets 01/25/2019  Navigator Location [facility]  Referral Date to RadOnc/MedOnc -  Navigator Encounter Type Other/I updated Dr. Lacie Draft office on cancer conference discussion and need for follow up with him.   Telephone -  Treatment Phase -  Barriers/Navigation Needs Coordination of Care  Education -  Interventions Coordination of Care  Acuity Level 2-Minimal Needs (1-2 Barriers Identified)  Coordination of Care Other  Education Method -  Time Spent with Patient 15

## 2019-01-25 NOTE — Progress Notes (Signed)
The purposed treatment discussed at cancer conference 01/25/19 is for discussion purpose only and is not a binding recommendation.  The patient was not physically examined nor present for their treatment options.  Therefore, final treatment plans cannot be decided.

## 2019-01-31 ENCOUNTER — Encounter: Payer: Self-pay | Admitting: *Deleted

## 2019-01-31 NOTE — Progress Notes (Signed)
I received a message from the thoracic office that there is no need for patient to see Dr. Roxan Hockey at this time.

## 2019-01-31 NOTE — Progress Notes (Signed)
Oncology Nurse Navigator Documentation  Oncology Nurse Navigator Flowsheets 01/31/2019  Navigator Location [facility]  Referral Date to RadOnc/MedOnc -  Navigator Encounter Type Other/I followed up with Dr. Leonarda Salon office to check on patient's appt with him.   Telephone -  Treatment Phase -  Barriers/Navigation Needs Coordination of Care  Education -  Interventions Coordination of Care  Acuity Level 2-Minimal Needs (1-2 Barriers Identified)  Coordination of Care Other  Education Method -  Time Spent with Patient 15

## 2019-02-04 NOTE — Progress Notes (Signed)
Letter per wife request. Loma Sousa, RN BSN

## 2019-02-06 ENCOUNTER — Other Ambulatory Visit: Payer: Self-pay

## 2019-02-06 ENCOUNTER — Ambulatory Visit
Admission: RE | Admit: 2019-02-06 | Discharge: 2019-02-06 | Disposition: A | Discharge status: Home / Self Care | Payer: Medicare Other | Source: Ambulatory Visit | Attending: Radiation Oncology | Admitting: Radiation Oncology

## 2019-02-06 DIAGNOSIS — C3412 Malignant neoplasm of upper lobe, left bronchus or lung: Secondary | ICD-10-CM | POA: Insufficient documentation

## 2019-02-06 DIAGNOSIS — Z510 Encounter for antineoplastic radiation therapy: Secondary | ICD-10-CM | POA: Diagnosis not present

## 2019-02-06 DIAGNOSIS — C3492 Malignant neoplasm of unspecified part of left bronchus or lung: Secondary | ICD-10-CM

## 2019-02-07 DIAGNOSIS — C3492 Malignant neoplasm of unspecified part of left bronchus or lung: Secondary | ICD-10-CM | POA: Insufficient documentation

## 2019-02-07 NOTE — Progress Notes (Addendum)
  Radiation Oncology         [phone] ________________________________  Name: [name] MRN: [id number]  Date: 02/06/2019  DOB: Dec 24, 1935  SIMULATION AND TREATMENT PLANNING NOTE    ICD-10-CM   1. Recurrent cancer of left lung of unknown cell type (HCC)  C34.92     DIAGNOSIS: Recurrent non-small cell lung cancer  NARRATIVE:  The patient was brought to the [facility].  Identity was confirmed.  All relevant records and images related to the planned course of therapy were reviewed.  The patient freely provided informed written consent to proceed with treatment after reviewing the details related to the planned course of therapy. The consent form was witnessed and verified by the simulation staff.  Then, the patient was set-up in a stable reproducible  supine position for radiation therapy.  CT images were obtained.  Surface markings were placed.  The CT images were loaded into the planning software.  Then the target and avoidance structures were contoured.  Treatment planning then occurred.  The radiation prescription was entered and confirmed.  Then, I designed and supervised the construction of a total of 6 medically necessary complex treatment devices.  I have requested : Intensity Modulated Radiotherapy (IMRT) is medically necessary for this case for the following reason:  Previous treatment to this area..  I have ordered:dose calc.  PLAN:  The patient will receive 62-64 Gy in 31-32 fractions.  Patient's case was presented to the multidisciplinary thoracic oncology.  Based on the location of the lesion to be too risky risky to consider biopsy.  Recommendations for the conference.  To proceed with radiation therapy without tissue diagnosis.  I discussed these issues with [name] who is in agreement.  Patient is not a candidate for SBRT again but would be a candidate for fractionated radiation therapy.  we will likely use IMRT given previous treatments to this area and to  limit dose to surrounding normal structures.  -----------------------------------  Blair Promise, PhD, MD

## 2019-02-12 DIAGNOSIS — C3412 Malignant neoplasm of upper lobe, left bronchus or lung: Secondary | ICD-10-CM | POA: Diagnosis not present

## 2019-02-13 ENCOUNTER — Other Ambulatory Visit: Payer: Self-pay

## 2019-02-13 ENCOUNTER — Ambulatory Visit
Admission: RE | Admit: 2019-02-13 | Discharge: 2019-02-13 | Disposition: A | Discharge status: Home / Self Care | Payer: Medicare Other | Source: Ambulatory Visit | Attending: Radiation Oncology | Admitting: Radiation Oncology

## 2019-02-13 DIAGNOSIS — C3492 Malignant neoplasm of unspecified part of left bronchus or lung: Secondary | ICD-10-CM

## 2019-02-13 DIAGNOSIS — Z510 Encounter for antineoplastic radiation therapy: Secondary | ICD-10-CM | POA: Diagnosis not present

## 2019-02-13 DIAGNOSIS — C3412 Malignant neoplasm of upper lobe, left bronchus or lung: Secondary | ICD-10-CM | POA: Diagnosis not present

## 2019-02-13 NOTE — Progress Notes (Signed)
  Radiation Oncology         [phone] ________________________________  Name: [name] MRN: [id number]  Date: 02/13/2019  DOB: 09/14/1935  Simulation Verification Note    ICD-10-CM   1. Recurrent cancer of left lung of unknown cell type ([facility])  C34.92     Status: outpatient  NARRATIVE: The patient was brought to the treatment unit and placed in the planned treatment position. The clinical setup was verified. Then port films were obtained and uploaded to the radiation oncology medical record software.  The treatment beams were carefully compared against the planned radiation fields. The position location and shape of the radiation fields was reviewed. They targeted volume of tissue appears to be appropriately covered by the radiation beams. Organs at risk appear to be excluded as planned.  Based on my personal review, I approved the simulation verification. The patient's treatment will proceed as planned.  -----------------------------------  Blair Promise, PhD, MD

## 2019-02-14 ENCOUNTER — Other Ambulatory Visit: Payer: Self-pay

## 2019-02-14 ENCOUNTER — Ambulatory Visit
Admission: RE | Admit: 2019-02-14 | Discharge: 2019-02-14 | Disposition: A | Discharge status: Home / Self Care | Payer: Medicare Other | Source: Ambulatory Visit | Attending: Radiation Oncology | Admitting: Radiation Oncology

## 2019-02-14 DIAGNOSIS — C3412 Malignant neoplasm of upper lobe, left bronchus or lung: Secondary | ICD-10-CM | POA: Diagnosis not present

## 2019-02-15 ENCOUNTER — Other Ambulatory Visit: Payer: Self-pay

## 2019-02-15 ENCOUNTER — Ambulatory Visit
Admission: RE | Admit: 2019-02-15 | Discharge: 2019-02-15 | Disposition: A | Discharge status: Home / Self Care | Payer: Medicare Other | Source: Ambulatory Visit | Attending: Radiation Oncology | Admitting: Radiation Oncology

## 2019-02-15 DIAGNOSIS — C3412 Malignant neoplasm of upper lobe, left bronchus or lung: Secondary | ICD-10-CM | POA: Diagnosis not present

## 2019-02-16 ENCOUNTER — Other Ambulatory Visit: Payer: Self-pay

## 2019-02-16 ENCOUNTER — Ambulatory Visit
Admission: RE | Admit: 2019-02-16 | Discharge: 2019-02-16 | Disposition: A | Discharge status: Home / Self Care | Payer: Medicare Other | Source: Ambulatory Visit | Attending: Radiation Oncology | Admitting: Radiation Oncology

## 2019-02-16 DIAGNOSIS — C3412 Malignant neoplasm of upper lobe, left bronchus or lung: Secondary | ICD-10-CM | POA: Diagnosis not present

## 2019-02-19 ENCOUNTER — Other Ambulatory Visit: Payer: Self-pay

## 2019-02-19 ENCOUNTER — Ambulatory Visit
Admission: RE | Admit: 2019-02-19 | Discharge: 2019-02-19 | Disposition: A | Discharge status: Home / Self Care | Payer: Medicare Other | Source: Ambulatory Visit | Attending: Radiation Oncology | Admitting: Radiation Oncology

## 2019-02-19 DIAGNOSIS — C3412 Malignant neoplasm of upper lobe, left bronchus or lung: Secondary | ICD-10-CM | POA: Diagnosis not present

## 2019-02-20 ENCOUNTER — Other Ambulatory Visit: Payer: Self-pay

## 2019-02-20 ENCOUNTER — Ambulatory Visit
Admission: RE | Admit: 2019-02-20 | Discharge: 2019-02-20 | Disposition: A | Discharge status: Home / Self Care | Payer: Medicare Other | Source: Ambulatory Visit | Attending: Radiation Oncology | Admitting: Radiation Oncology

## 2019-02-20 DIAGNOSIS — C3412 Malignant neoplasm of upper lobe, left bronchus or lung: Secondary | ICD-10-CM | POA: Diagnosis not present

## 2019-02-21 ENCOUNTER — Ambulatory Visit
Admission: RE | Admit: 2019-02-21 | Discharge: 2019-02-21 | Disposition: A | Discharge status: Home / Self Care | Payer: Medicare Other | Source: Ambulatory Visit | Attending: Radiation Oncology | Admitting: Radiation Oncology

## 2019-02-21 ENCOUNTER — Other Ambulatory Visit: Payer: Self-pay

## 2019-02-21 DIAGNOSIS — C3412 Malignant neoplasm of upper lobe, left bronchus or lung: Secondary | ICD-10-CM | POA: Diagnosis not present

## 2019-02-22 ENCOUNTER — Ambulatory Visit
Admission: RE | Admit: 2019-02-22 | Discharge: 2019-02-22 | Disposition: A | Discharge status: Home / Self Care | Payer: Medicare Other | Source: Ambulatory Visit | Attending: Radiation Oncology | Admitting: Radiation Oncology

## 2019-02-22 ENCOUNTER — Other Ambulatory Visit: Payer: Self-pay

## 2019-02-22 DIAGNOSIS — C3412 Malignant neoplasm of upper lobe, left bronchus or lung: Secondary | ICD-10-CM | POA: Diagnosis not present

## 2019-02-23 ENCOUNTER — Ambulatory Visit
Admission: RE | Admit: 2019-02-23 | Discharge: 2019-02-23 | Disposition: A | Discharge status: Home / Self Care | Payer: Medicare Other | Source: Ambulatory Visit | Attending: Radiation Oncology | Admitting: Radiation Oncology

## 2019-02-23 ENCOUNTER — Other Ambulatory Visit: Payer: Self-pay

## 2019-02-23 DIAGNOSIS — C3412 Malignant neoplasm of upper lobe, left bronchus or lung: Secondary | ICD-10-CM | POA: Diagnosis not present

## 2019-02-26 ENCOUNTER — Other Ambulatory Visit: Payer: Self-pay

## 2019-02-26 ENCOUNTER — Ambulatory Visit
Admission: RE | Admit: 2019-02-26 | Discharge: 2019-02-26 | Disposition: A | Discharge status: Home / Self Care | Payer: Medicare Other | Source: Ambulatory Visit | Attending: Radiation Oncology | Admitting: Radiation Oncology

## 2019-02-26 DIAGNOSIS — C3412 Malignant neoplasm of upper lobe, left bronchus or lung: Secondary | ICD-10-CM | POA: Diagnosis not present

## 2019-02-27 ENCOUNTER — Ambulatory Visit
Admission: RE | Admit: 2019-02-27 | Discharge: 2019-02-27 | Disposition: A | Discharge status: Home / Self Care | Payer: Medicare Other | Source: Ambulatory Visit | Attending: Radiation Oncology | Admitting: Radiation Oncology

## 2019-02-27 ENCOUNTER — Other Ambulatory Visit: Payer: Self-pay

## 2019-02-27 DIAGNOSIS — C3412 Malignant neoplasm of upper lobe, left bronchus or lung: Secondary | ICD-10-CM | POA: Diagnosis not present

## 2019-02-28 ENCOUNTER — Other Ambulatory Visit: Payer: Self-pay

## 2019-02-28 ENCOUNTER — Ambulatory Visit
Admission: RE | Admit: 2019-02-28 | Discharge: 2019-02-28 | Disposition: A | Discharge status: Home / Self Care | Payer: Medicare Other | Source: Ambulatory Visit | Attending: Radiation Oncology | Admitting: Radiation Oncology

## 2019-02-28 DIAGNOSIS — C3412 Malignant neoplasm of upper lobe, left bronchus or lung: Secondary | ICD-10-CM | POA: Diagnosis not present

## 2019-03-01 ENCOUNTER — Ambulatory Visit
Admission: RE | Admit: 2019-03-01 | Discharge: 2019-03-01 | Disposition: A | Discharge status: Home / Self Care | Payer: Medicare Other | Source: Ambulatory Visit | Attending: Radiation Oncology | Admitting: Radiation Oncology

## 2019-03-01 ENCOUNTER — Other Ambulatory Visit: Payer: Self-pay

## 2019-03-01 DIAGNOSIS — C3412 Malignant neoplasm of upper lobe, left bronchus or lung: Secondary | ICD-10-CM | POA: Diagnosis not present

## 2019-03-02 ENCOUNTER — Ambulatory Visit
Admission: RE | Admit: 2019-03-02 | Discharge: 2019-03-02 | Disposition: A | Discharge status: Home / Self Care | Payer: Medicare Other | Source: Ambulatory Visit | Attending: Radiation Oncology | Admitting: Radiation Oncology

## 2019-03-02 ENCOUNTER — Other Ambulatory Visit: Payer: Self-pay

## 2019-03-02 DIAGNOSIS — C3412 Malignant neoplasm of upper lobe, left bronchus or lung: Secondary | ICD-10-CM | POA: Diagnosis not present

## 2019-03-05 ENCOUNTER — Other Ambulatory Visit: Payer: Self-pay

## 2019-03-05 ENCOUNTER — Ambulatory Visit
Admission: RE | Admit: 2019-03-05 | Discharge: 2019-03-05 | Disposition: A | Discharge status: Home / Self Care | Payer: Medicare Other | Source: Ambulatory Visit | Attending: Radiation Oncology | Admitting: Radiation Oncology

## 2019-03-05 DIAGNOSIS — C3412 Malignant neoplasm of upper lobe, left bronchus or lung: Secondary | ICD-10-CM | POA: Diagnosis not present

## 2019-03-06 ENCOUNTER — Other Ambulatory Visit: Payer: Self-pay

## 2019-03-06 ENCOUNTER — Ambulatory Visit
Admission: RE | Admit: 2019-03-06 | Discharge: 2019-03-06 | Disposition: A | Discharge status: Home / Self Care | Payer: Medicare Other | Source: Ambulatory Visit | Attending: Radiation Oncology | Admitting: Radiation Oncology

## 2019-03-06 DIAGNOSIS — C3412 Malignant neoplasm of upper lobe, left bronchus or lung: Secondary | ICD-10-CM | POA: Diagnosis not present

## 2019-03-07 ENCOUNTER — Other Ambulatory Visit: Payer: Self-pay

## 2019-03-07 ENCOUNTER — Ambulatory Visit
Admission: RE | Admit: 2019-03-07 | Discharge: 2019-03-07 | Disposition: A | Discharge status: Home / Self Care | Payer: Medicare Other | Source: Ambulatory Visit | Attending: Radiation Oncology | Admitting: Radiation Oncology

## 2019-03-07 DIAGNOSIS — C3412 Malignant neoplasm of upper lobe, left bronchus or lung: Secondary | ICD-10-CM | POA: Diagnosis not present

## 2019-03-08 ENCOUNTER — Ambulatory Visit
Admission: RE | Admit: 2019-03-08 | Discharge: 2019-03-08 | Disposition: A | Discharge status: Home / Self Care | Payer: Medicare Other | Source: Ambulatory Visit | Attending: Radiation Oncology | Admitting: Radiation Oncology

## 2019-03-08 ENCOUNTER — Other Ambulatory Visit: Payer: Self-pay

## 2019-03-08 DIAGNOSIS — C3412 Malignant neoplasm of upper lobe, left bronchus or lung: Secondary | ICD-10-CM | POA: Diagnosis not present

## 2019-03-12 ENCOUNTER — Ambulatory Visit
Admission: RE | Admit: 2019-03-12 | Discharge: 2019-03-12 | Disposition: A | Discharge status: Home / Self Care | Payer: Medicare Other | Source: Ambulatory Visit | Attending: Radiation Oncology | Admitting: Radiation Oncology

## 2019-03-12 ENCOUNTER — Other Ambulatory Visit: Payer: Self-pay

## 2019-03-12 DIAGNOSIS — C3412 Malignant neoplasm of upper lobe, left bronchus or lung: Secondary | ICD-10-CM | POA: Diagnosis not present

## 2019-03-13 ENCOUNTER — Ambulatory Visit
Admission: RE | Admit: 2019-03-13 | Discharge: 2019-03-13 | Disposition: A | Discharge status: Home / Self Care | Payer: Medicare Other | Source: Ambulatory Visit | Attending: Radiation Oncology | Admitting: Radiation Oncology

## 2019-03-13 ENCOUNTER — Other Ambulatory Visit: Payer: Self-pay

## 2019-03-13 DIAGNOSIS — C3412 Malignant neoplasm of upper lobe, left bronchus or lung: Secondary | ICD-10-CM | POA: Diagnosis not present

## 2019-03-14 ENCOUNTER — Ambulatory Visit
Admission: RE | Admit: 2019-03-14 | Discharge: 2019-03-14 | Disposition: A | Discharge status: Home / Self Care | Payer: Medicare Other | Source: Ambulatory Visit | Attending: Radiation Oncology | Admitting: Radiation Oncology

## 2019-03-14 ENCOUNTER — Other Ambulatory Visit: Payer: Self-pay

## 2019-03-14 DIAGNOSIS — C3412 Malignant neoplasm of upper lobe, left bronchus or lung: Secondary | ICD-10-CM | POA: Diagnosis not present

## 2019-03-15 ENCOUNTER — Ambulatory Visit
Admission: RE | Admit: 2019-03-15 | Discharge: 2019-03-15 | Disposition: A | Discharge status: Home / Self Care | Payer: Medicare Other | Source: Ambulatory Visit | Attending: Radiation Oncology | Admitting: Radiation Oncology

## 2019-03-15 ENCOUNTER — Other Ambulatory Visit: Payer: Self-pay

## 2019-03-15 DIAGNOSIS — C3412 Malignant neoplasm of upper lobe, left bronchus or lung: Secondary | ICD-10-CM | POA: Diagnosis not present

## 2019-03-19 ENCOUNTER — Ambulatory Visit
Admission: RE | Admit: 2019-03-19 | Discharge: 2019-03-19 | Disposition: A | Discharge status: Home / Self Care | Payer: Medicare Other | Source: Ambulatory Visit | Attending: Radiation Oncology | Admitting: Radiation Oncology

## 2019-03-19 ENCOUNTER — Other Ambulatory Visit: Payer: Self-pay

## 2019-03-19 DIAGNOSIS — C3412 Malignant neoplasm of upper lobe, left bronchus or lung: Secondary | ICD-10-CM | POA: Diagnosis not present

## 2019-03-19 DIAGNOSIS — Z510 Encounter for antineoplastic radiation therapy: Secondary | ICD-10-CM | POA: Insufficient documentation

## 2019-03-20 ENCOUNTER — Ambulatory Visit
Admission: RE | Admit: 2019-03-20 | Discharge: 2019-03-20 | Disposition: A | Discharge status: Home / Self Care | Payer: Medicare Other | Source: Ambulatory Visit | Attending: Radiation Oncology | Admitting: Radiation Oncology

## 2019-03-20 ENCOUNTER — Other Ambulatory Visit: Payer: Self-pay

## 2019-03-20 DIAGNOSIS — C3412 Malignant neoplasm of upper lobe, left bronchus or lung: Secondary | ICD-10-CM | POA: Diagnosis not present

## 2019-03-21 ENCOUNTER — Ambulatory Visit
Admission: RE | Admit: 2019-03-21 | Discharge: 2019-03-21 | Disposition: A | Discharge status: Home / Self Care | Payer: Medicare Other | Source: Ambulatory Visit | Attending: Radiation Oncology | Admitting: Radiation Oncology

## 2019-03-21 ENCOUNTER — Other Ambulatory Visit: Payer: Self-pay

## 2019-03-21 DIAGNOSIS — C3412 Malignant neoplasm of upper lobe, left bronchus or lung: Secondary | ICD-10-CM | POA: Diagnosis not present

## 2019-03-22 ENCOUNTER — Ambulatory Visit
Admission: RE | Admit: 2019-03-22 | Discharge: 2019-03-22 | Disposition: A | Discharge status: Home / Self Care | Payer: Medicare Other | Source: Ambulatory Visit | Attending: Radiation Oncology | Admitting: Radiation Oncology

## 2019-03-22 ENCOUNTER — Other Ambulatory Visit: Payer: Self-pay

## 2019-03-22 DIAGNOSIS — C3412 Malignant neoplasm of upper lobe, left bronchus or lung: Secondary | ICD-10-CM | POA: Diagnosis not present

## 2019-03-23 ENCOUNTER — Ambulatory Visit
Admission: RE | Admit: 2019-03-23 | Discharge: 2019-03-23 | Disposition: A | Discharge status: Home / Self Care | Payer: Medicare Other | Source: Ambulatory Visit | Attending: Radiation Oncology | Admitting: Radiation Oncology

## 2019-03-23 ENCOUNTER — Other Ambulatory Visit: Payer: Self-pay

## 2019-03-23 DIAGNOSIS — C3412 Malignant neoplasm of upper lobe, left bronchus or lung: Secondary | ICD-10-CM | POA: Diagnosis not present

## 2019-03-26 ENCOUNTER — Ambulatory Visit
Admission: RE | Admit: 2019-03-26 | Discharge: 2019-03-26 | Disposition: A | Discharge status: Home / Self Care | Payer: Medicare Other | Source: Ambulatory Visit | Attending: Radiation Oncology | Admitting: Radiation Oncology

## 2019-03-26 ENCOUNTER — Other Ambulatory Visit: Payer: Self-pay

## 2019-03-26 DIAGNOSIS — C3412 Malignant neoplasm of upper lobe, left bronchus or lung: Secondary | ICD-10-CM | POA: Diagnosis not present

## 2019-03-27 ENCOUNTER — Ambulatory Visit
Admission: RE | Admit: 2019-03-27 | Discharge: 2019-03-27 | Disposition: A | Discharge status: Home / Self Care | Payer: Medicare Other | Source: Ambulatory Visit | Attending: Radiation Oncology | Admitting: Radiation Oncology

## 2019-03-27 ENCOUNTER — Other Ambulatory Visit: Payer: Self-pay

## 2019-03-27 DIAGNOSIS — C3412 Malignant neoplasm of upper lobe, left bronchus or lung: Secondary | ICD-10-CM | POA: Diagnosis not present

## 2019-03-28 ENCOUNTER — Encounter: Payer: Self-pay | Admitting: Radiation Oncology

## 2019-03-28 ENCOUNTER — Ambulatory Visit
Admission: RE | Admit: 2019-03-28 | Discharge: 2019-03-28 | Disposition: A | Discharge status: Home / Self Care | Payer: Medicare Other | Source: Ambulatory Visit | Attending: Radiation Oncology | Admitting: Radiation Oncology

## 2019-03-28 ENCOUNTER — Other Ambulatory Visit: Payer: Self-pay

## 2019-03-28 DIAGNOSIS — C3412 Malignant neoplasm of upper lobe, left bronchus or lung: Secondary | ICD-10-CM | POA: Diagnosis not present

## 2019-04-04 NOTE — Progress Notes (Incomplete)
  Patient Name: [name] MRN: [id number] DOB: Dec 07, 1935 Referring Physician: Modesto Charon (Profile Not Attached) Date of Service: 03/28/2019 [facility], [place]                                                        End Of Treatment Note  Diagnoses: C34.12-Malignant neoplasm of upper lobe, left bronchus or lung Z85.118-Personal history of other malignant neoplasm of bronchus and lung  Cancer Staging: Recurrent non-small cell lung cancer  Intent: Curative  Radiation Treatment Dates: 02/13/2019 through 03/28/2019 Site Technique Total Dose (Gy) Dose per Fx (Gy) Completed Fx Beam Energies  Lung, Left: Lung_Lt IMRT 60/60 2 30/30 6X   Narrative: The patient tolerated radiation therapy relatively well. On 02/20/2019, patient began to report an occasional productive cough with clear phlegm and "gland" in neck that was "irritated". On examination, the neck was carefully palpated and did not reveal any suspicious lymphadenopathy. He denied hemoptysis, difficulty swallowing, fatigue, and difficulty breathing.  On 02/28/2019, the patient reported an intermittent "vague" feeling in his chest that was relieved with several deep breaths. He continued to report an occasional productive cough with clear to yellow phlegm as well as shortness of breath with exertion. He denied hemoptysis and difficulty swallowing.  On 03/13/2019, the patient reported an episode of a "weird sensation" of not being able to move air through his left lung, which spontaneously resolved. He also reported an infrequent cough after eating and some esophagitis that was managed by taking smaller bites while eating. Patient stated that he was beginning to use supplemental oxygen more frequently, which was improving his shortness of breath with exertion. Denied  hemoptysis.  On 03/19/2018, the patient continued to report an occasional cough with clear congestion and shortness of breath with exertion. He also reported an occasional burning sensation in his throat.  On 03/27/2019, the patient reported a sore throat but did not want to take Carafate. He also continued to report occasional cough/congestion and shortness of breath with exertion. Denied hemoptysis.  Plan: The patient will follow-up with radiation oncology in one month.  ________________________________________________   Blair Promise, PhD, MD  This document serves as a record of services personally performed by Gery Pray, MD. It was created on his behalf by Clerance Lav, a trained medical scribe. The creation of this record is based on the scribe's personal observations and the provider's statements to them. This document has been checked and approved by the attending provider.

## 2019-04-19 ENCOUNTER — Ambulatory Visit: Payer: Medicare Other | Admitting: Cardiology

## 2019-04-26 ENCOUNTER — Other Ambulatory Visit: Payer: Self-pay | Admitting: Internal Medicine

## 2019-04-26 DIAGNOSIS — M810 Age-related osteoporosis without current pathological fracture: Secondary | ICD-10-CM

## 2019-05-03 ENCOUNTER — Ambulatory Visit: Payer: Medicare Other | Admitting: Radiation Oncology

## 2019-05-03 ENCOUNTER — Telehealth: Payer: Self-pay | Admitting: Radiation Oncology

## 2019-05-03 ENCOUNTER — Telehealth: Payer: Self-pay

## 2019-05-03 NOTE — Telephone Encounter (Signed)
Pt received "email" informing him that today's appt was canceled. Rescheduled for 2/23 at 1100. Wife verbalized understanding and agreement. Loma Sousa, RN BSN

## 2019-05-03 NOTE — Telephone Encounter (Signed)
Patient did not show for 1030 appointment. Phoned to inquire. Spoke with patient's wife, Silva Bandy, who reports they received a call from someone named Ulice Dash cancelling his follow up. Silva Bandy understands that Sharee Pimple, Dr. Clabe Seal nurse, will phone back with a new appointment date and time for them.

## 2019-05-06 NOTE — Progress Notes (Signed)
Radiation Oncology         [phone] ________________________________  Name: [name] MRN: [id number]  Date: 05/08/2019  DOB: [birth year]  Follow-Up Visit Note  CC: Wenda Low, MD  Melrose Nakayama, *    ICD-10-CM   1. Recurrent cancer of left lung of unknown cell type (HCC)  C34.92 NM PET Image Restag (PS) Skull Base To Thigh    Diagnosis: Recurrent non-small cell lung cancer  Interval Since Last Radiation: One month, one week, and two days.  Radiation Treatment Dates: 02/13/2019 through 03/28/2019 Site Technique Total Dose (Gy) Dose per Fx (Gy) Completed Fx Beam Energies  Lung, Left: Lung_Lt IMRT 60/60 2 30/30 6X    Narrative:  The patient returns today for routine follow-up. No significant interval history since the end of treatment. Patient was originally supposed to be seen on 05/03/2019, but states that he was asked to cancel that appointment and reschedule.  On review of systems, the patient reports he is breathing well. The patient denies significant cough or hemoptysis.  Has had some mild esophageal issues as documented in the nurses note but does not wish to have any medication for this issue.  ALLERGIES:  is allergic to entresto [sacubitril-valsartan]; iodinated diagnostic agents; penicillins; lumigan [bimatoprost]; lyrica [pregabalin]; sulfonamide derivatives; symbicort [budesonide-formoterol fumarate]; valsartan; advair diskus [fluticasone-salmeterol]; bacitracin; brimonidine tartrate; ciprofloxacin; flovent diskus [fluticasone propionate (inhal)]; pulmicort [budesonide]; qvar [beclomethasone]; spiriva [tiotropium bromide monohydrate]; tessalon [benzonatate]; adhesive [tape]; flonase [fluticasone propionate]; and relafen [nabumetone].  Meds: Current Outpatient Medications  Medication Sig Dispense Refill  . albuterol (PROAIR HFA) 108 (90 BASE) MCG/ACT inhaler Inhale 2 puffs into the lungs every 6 (six) hours as needed for wheezing or shortness of breath.      Marland Kitchen albuterol (PROVENTIL) (2.5 MG/3ML) 0.083% nebulizer solution Take 2.5 mg by nebulization every 6 (six) hours as needed for wheezing or shortness of breath.    Marland Kitchen alendronate (FOSAMAX) 70 MG tablet Take 70 mg by mouth once a week. Take with a full glass of water on an empty stomach.    Marland Kitchen aspirin 81 MG tablet Take 81 mg by mouth daily.     Marland Kitchen atorvastatin (LIPITOR) 20 MG tablet daily.    Marland Kitchen CALCIUM-MAGNESIUM-VITAMIN D PO Take 1 tablet by mouth daily.    . dorzolamide-timolol (COSOPT) 22.3-6.8 MG/ML ophthalmic solution Place 1 drop into both eyes 2 (two) times daily.    Marland Kitchen gabapentin (NEURONTIN) 300 MG capsule Take 600 mg by mouth 2 (two) times daily.     Marland Kitchen latanoprost (XALATAN) 0.005 % ophthalmic solution Place 1 drop into both eyes at bedtime.     . methocarbamol (ROBAXIN) 750 MG tablet Take 750 mg by mouth as needed for muscle spasms.    . metoprolol succinate (TOPROL-XL) 25 MG 24 hr tablet Take 25 mg by mouth every morning.     . nitroGLYCERIN (NITROSTAT) 0.4 MG SL tablet Place 0.4 mg under the tongue every 5 (five) minutes as needed for chest pain. For chest pain     . OXYGEN 2lpm with sleep only    . Tamsulosin HCl (FLOMAX) 0.4 MG CAPS Take 0.4 mg by mouth every evening.     . timolol (TIMOPTIC) 0.5 % ophthalmic solution      No current facility-administered medications for this encounter.    Physical Findings: The patient is in no acute distress. Patient is alert and oriented.  height is 5\' 11"  (1.803 m) and weight is 199 lb 2 oz (90.3 kg). His temporal temperature  is 97.8 F (36.6 C). His blood pressure is 112/70 and his pulse is 68. His respiration is 18 and oxygen saturation is 92%.  Lungs are clear to auscultation bilaterally. Heart has regular rate and rhythm. No palpable cervical, supraclavicular, or axillary adenopathy. Abdomen soft, non-tender, normal bowel sounds.  No appreciable skin reaction in the left upper chest  Lab Findings: Lab Results  Component Value Date   WBC 11.6  (H) 09/26/2018   HGB 14.7 09/26/2018   HCT 44.0 09/26/2018   MCV 93 09/26/2018   PLT 151 09/26/2018    Radiographic Findings: No results found.  Impression: Recurrent non-small cell lung cancer  The patient is recovering from the effects of radiation.  He overall tolerated his retreatment well  Plan: Patient will follow up with radiation oncology in three months.  Prior to this visit the patient will proceed with a PET scan. ____________________________________   Blair Promise, PhD, MD  This document serves as a record of services personally performed by Gery Pray, MD. It was created on his behalf by Clerance Lav, a trained medical scribe. The creation of this record is based on the scribe's personal observations and the provider's statements to them. This document has been checked and approved by the attending provider.

## 2019-05-08 ENCOUNTER — Ambulatory Visit
Admission: RE | Admit: 2019-05-08 | Discharge: 2019-05-08 | Disposition: A | Discharge status: Home / Self Care | Payer: Medicare Other | Source: Ambulatory Visit | Attending: Radiation Oncology | Admitting: Radiation Oncology

## 2019-05-08 ENCOUNTER — Encounter: Payer: Self-pay | Admitting: Radiation Oncology

## 2019-05-08 ENCOUNTER — Other Ambulatory Visit: Payer: Self-pay

## 2019-05-08 VITALS — BP 112/70 | HR 68 | Temp 97.8°F | Resp 18 | Ht 71.0 in | Wt 199.1 lb

## 2019-05-08 DIAGNOSIS — Z923 Personal history of irradiation: Secondary | ICD-10-CM | POA: Insufficient documentation

## 2019-05-08 DIAGNOSIS — Z7982 Long term (current) use of aspirin: Secondary | ICD-10-CM | POA: Diagnosis not present

## 2019-05-08 DIAGNOSIS — Z79899 Other long term (current) drug therapy: Secondary | ICD-10-CM | POA: Insufficient documentation

## 2019-05-08 DIAGNOSIS — C3412 Malignant neoplasm of upper lobe, left bronchus or lung: Secondary | ICD-10-CM | POA: Diagnosis not present

## 2019-05-08 DIAGNOSIS — C3492 Malignant neoplasm of unspecified part of left bronchus or lung: Secondary | ICD-10-CM

## 2019-05-08 NOTE — Progress Notes (Signed)
Pt presents today for f/u with Dr. Sondra Come. Pt is accompanied by wife. Pt reports frequent cough, "congestion and is slightly sweet tasting". Pt and wife think its possible that he could have occasional silent aspiration. Pt reports frequent sensation of lump in throat. Pt is willing to try Carafate but reports lump in throat is not "upsetting or a bother". Pt reports SOB with exertion. Pt is using supplemental oxygen as needed for walking long distances.   BP 112/70 (BP Location: Left Arm, Patient Position: Sitting)   Pulse 68   Temp 97.8 F (36.6 C) (Temporal)   Resp 18   Ht 5\' 11"  (1.803 m)   Wt 199 lb 2 oz (90.3 kg)   SpO2 92%   BMI 27.77 kg/m   Wt Readings from Last 3 Encounters:  05/08/19 199 lb 2 oz (90.3 kg)  12/28/18 201 lb (91.2 kg)  12/07/18 199 lb (90.3 kg)   Loma Sousa, RN BSN

## 2019-05-08 NOTE — Patient Instructions (Signed)
Coronavirus (COVID-19) Are you at risk?  Are you at risk for the Coronavirus (COVID-19)?  To be considered HIGH RISK for Coronavirus (COVID-19), you have to meet the following criteria:  . Traveled to [place], [place], [place], [place] or [place]; or in the [place] to [place], [place], [place], or [place]; and have fever, cough, and shortness of breath within the last 2 weeks of travel OR . Been in close contact with a person diagnosed with COVID-19 within the last 2 weeks and have fever, cough, and shortness of breath . IF YOU DO NOT MEET THESE CRITERIA, YOU ARE CONSIDERED LOW RISK FOR COVID-19.  What to do if you are HIGH RISK for COVID-19?  . If you are having a medical emergency, call 911. . Seek medical care right away. Before you go to a doctor's office, urgent care or emergency department, call ahead and tell them about your recent travel, contact with someone diagnosed with COVID-19, and your symptoms. You should receive instructions from your physician's office regarding next steps of care.  . When you arrive at healthcare provider, tell the healthcare staff immediately you have returned from visiting [place], [place], [place], [place] or [place]; or traveled in the [place] to [place], [place], [place], or [place]; in the last two weeks or you have been in close contact with a person diagnosed with COVID-19 in the last 2 weeks.   . Tell the health care staff about your symptoms: fever, cough and shortness of breath. . After you have been seen by a medical provider, you will be either: o Tested for (COVID-19) and discharged home on quarantine except to seek medical care if symptoms worsen, and asked to  - Stay home and avoid contact with others until you get your results (4-5 days)  - Avoid travel on public transportation if possible (such as bus, train, or airplane) or o Sent to the Emergency Department by EMS for evaluation, COVID-19 testing, and possible  admission depending on your condition and test results.  What to do if you are LOW RISK for COVID-19?  Reduce your risk of any infection by using the same precautions used for avoiding the common cold or flu:  . Wash your hands often with soap and warm water for at least 20 seconds.  If soap and water are not readily available, use an alcohol-based hand sanitizer with at least 60% alcohol.  . If coughing or sneezing, cover your mouth and nose by coughing or sneezing into the elbow areas of your shirt or coat, into a tissue or into your sleeve (not your hands). . Avoid shaking hands with others and consider head nods or verbal greetings only. . Avoid touching your eyes, nose, or mouth with unwashed hands.  . Avoid close contact with people who are sick. . Avoid places or events with large numbers of people in one location, like concerts or sporting events. . Carefully consider travel plans you have or are making. . If you are planning any travel outside or inside the [place], visit the CDC's Travelers' Health webpage for the latest health notices. . If you have some symptoms but not all symptoms, continue to monitor at home and seek medical attention if your symptoms worsen. . If you are having a medical emergency, call 911.   ADDITIONAL HEALTHCARE OPTIONS FOR PATIENTS  [facility] / e-Visit: [email or web address]         [facility]: [phone]  [facility]: [phone]                   [facility]: [phone]   

## 2019-05-24 ENCOUNTER — Telehealth: Payer: Self-pay | Admitting: *Deleted

## 2019-05-24 NOTE — Telephone Encounter (Signed)
Per pt wife Dr Sondra Come told them they didn't need to come in March.  So they wanted to cancel appt.

## 2019-06-07 ENCOUNTER — Encounter: Payer: Self-pay | Admitting: Radiation Oncology

## 2019-06-07 ENCOUNTER — Ambulatory Visit
Admission: RE | Admit: 2019-06-07 | Discharge: 2019-06-07 | Disposition: A | Discharge status: Home / Self Care | Payer: Medicare Other | Source: Ambulatory Visit | Attending: Radiation Oncology | Admitting: Radiation Oncology

## 2019-06-07 ENCOUNTER — Other Ambulatory Visit: Payer: Self-pay

## 2019-06-07 ENCOUNTER — Ambulatory Visit: Payer: Medicare Other | Admitting: Radiation Oncology

## 2019-06-07 ENCOUNTER — Ambulatory Visit: Payer: Self-pay | Admitting: Radiation Oncology

## 2019-06-07 VITALS — BP 114/78 | HR 68 | Temp 98.0°F | Resp 20 | Ht 71.0 in | Wt 199.6 lb

## 2019-06-07 DIAGNOSIS — C3492 Malignant neoplasm of unspecified part of left bronchus or lung: Secondary | ICD-10-CM | POA: Diagnosis not present

## 2019-06-07 DIAGNOSIS — Z510 Encounter for antineoplastic radiation therapy: Secondary | ICD-10-CM | POA: Diagnosis not present

## 2019-06-07 DIAGNOSIS — C3412 Malignant neoplasm of upper lobe, left bronchus or lung: Secondary | ICD-10-CM | POA: Diagnosis present

## 2019-06-07 NOTE — Progress Notes (Signed)
Radiation Oncology         [phone] ________________________________  Name: [name] MRN: [id number]  Date: 06/07/2019  DOB: 1935-09-24   Follow-Up Visit Note  CC: Wenda Low, MD  Melrose Nakayama, *    ICD-10-CM   1. Recurrent cancer of left lung of unknown cell type (HCC)  C34.92 Ambulatory referral to ENT    Diagnosis: Recurrent non-small cell lung cancer  Interval Since Last Radiation: One month, one week, and two days.  Radiation Treatment Dates: 02/13/2019 through 03/28/2019 Site Technique Total Dose (Gy) Dose per Fx (Gy) Completed Fx Beam Energies  Lung, Left: Lung_Lt IMRT 60/60 2 30/30 6X    Narrative:  The patient returns today as a work-in appointment. His wife had contacted my nurse Sharee Pimple to report that the patient has a new "heavy cough." He reports that the cough only happens after eating or drinking and not at any other time. He reports occasional clear, thick phlegm, and he denies hemoptysis or food in the phlegm.  They have also noticed problems with hoarseness.    ALLERGIES:  is allergic to entresto [sacubitril-valsartan]; iodinated diagnostic agents; penicillins; lumigan [bimatoprost]; lyrica [pregabalin]; sulfonamide derivatives; symbicort [budesonide-formoterol fumarate]; valsartan; advair diskus [fluticasone-salmeterol]; bacitracin; brimonidine tartrate; ciprofloxacin; flovent diskus [fluticasone propionate (inhal)]; pulmicort [budesonide]; qvar [beclomethasone]; spiriva [tiotropium bromide monohydrate]; tessalon [benzonatate]; adhesive [tape]; flonase [fluticasone propionate]; and relafen [nabumetone].  Meds: Current Outpatient Medications  Medication Sig Dispense Refill  . albuterol (PROAIR HFA) 108 (90 BASE) MCG/ACT inhaler Inhale 2 puffs into the lungs every 6 (six) hours as needed for wheezing or shortness of breath.     Marland Kitchen albuterol (PROVENTIL) (2.5 MG/3ML) 0.083% nebulizer solution Take 2.5 mg by nebulization every 6 (six) hours as needed  for wheezing or shortness of breath.    Marland Kitchen alendronate (FOSAMAX) 70 MG tablet Take 70 mg by mouth once a week. Take with a full glass of water on an empty stomach.    Marland Kitchen aspirin 81 MG tablet Take 81 mg by mouth daily.     Marland Kitchen atorvastatin (LIPITOR) 20 MG tablet daily.    Marland Kitchen CALCIUM-MAGNESIUM-VITAMIN D PO Take 1 tablet by mouth daily.    . dorzolamide-timolol (COSOPT) 22.3-6.8 MG/ML ophthalmic solution Place 1 drop into both eyes 2 (two) times daily.    Marland Kitchen gabapentin (NEURONTIN) 300 MG capsule Take 600 mg by mouth 2 (two) times daily.     Marland Kitchen latanoprost (XALATAN) 0.005 % ophthalmic solution Place 1 drop into both eyes at bedtime.     . methocarbamol (ROBAXIN) 750 MG tablet Take 750 mg by mouth as needed for muscle spasms.    . metoprolol succinate (TOPROL-XL) 25 MG 24 hr tablet Take 25 mg by mouth every morning.     . nitroGLYCERIN (NITROSTAT) 0.4 MG SL tablet Place 0.4 mg under the tongue every 5 (five) minutes as needed for chest pain. For chest pain     . OXYGEN 2lpm with sleep only    . Tamsulosin HCl (FLOMAX) 0.4 MG CAPS Take 0.4 mg by mouth every evening.     . timolol (TIMOPTIC) 0.5 % ophthalmic solution      No current facility-administered medications for this encounter.    Physical Findings: The patient is in no acute distress. Patient is alert and oriented.  height is 5\' 11"  (1.803 m) and weight is 199 lb 9.6 oz (90.5 kg). His temperature is 98 F (36.7 C). His blood pressure is 114/78 and his pulse is 68. His respiration is  20 and oxygen saturation is 93%.  Lungs are clear to auscultation bilaterally. Heart has regular rate and rhythm. No palpable cervical, supraclavicular, or axillary adenopathy. Abdomen soft, non-tender, normal bowel sounds.  No appreciable skin reaction in the left upper chest oropharynx clear without secondary infection.  Some hoarseness noted  Lab Findings: Lab Results  Component Value Date   WBC 11.6 (H) 09/26/2018   HGB 14.7 09/26/2018   HCT 44.0 09/26/2018    MCV 93 09/26/2018   PLT 151 09/26/2018    Radiographic Findings: No results found.  Impression: Recurrent non-small cell lung cancer  Patient completed patient therapy to the left upper lung area for recurrence in this region.  He had a prior course of stereotactic body radiation therapy prior to his most recent course of fractionated radiation therapy over 30 days. Patient has developed problems with hoarseness and sounds like aspiration issues recently as well as some swallowing difficulties.  Plan: Patient will follow up with radiation oncology in two months.  Prior to this visit the patient will proceed with a PET scan to follow-up on his radiation therapy for recurrence.  Patient will be referred to Dr. Christell Faith seen him in the past for ear issues) for evaluation of the patient's hoarseness and aspiration issues. ____________________________________   Blair Promise, PhD, MD  This document serves as a record of services personally performed by Gery Pray, MD. It was created on his behalf by Wilburn Mylar, a trained medical scribe. The creation of this record is based on the scribe's personal observations and the provider's statements to them. This document has been checked and approved by the attending provider.

## 2019-06-07 NOTE — Patient Instructions (Signed)
Coronavirus (COVID-19) Are you at risk?  Are you at risk for the Coronavirus (COVID-19)?  To be considered HIGH RISK for Coronavirus (COVID-19), you have to meet the following criteria:  . Traveled to [place], [place], [place], [place] or [place]; or in the [place] to [place], [place], [place], or [place]; and have fever, cough, and shortness of breath within the last 2 weeks of travel OR . Been in close contact with a person diagnosed with COVID-19 within the last 2 weeks and have fever, cough, and shortness of breath . IF YOU DO NOT MEET THESE CRITERIA, YOU ARE CONSIDERED LOW RISK FOR COVID-19.  What to do if you are HIGH RISK for COVID-19?  . If you are having a medical emergency, call 911. . Seek medical care right away. Before you go to a doctor's office, urgent care or emergency department, call ahead and tell them about your recent travel, contact with someone diagnosed with COVID-19, and your symptoms. You should receive instructions from your physician's office regarding next steps of care.  . When you arrive at healthcare provider, tell the healthcare staff immediately you have returned from visiting [place], [place], [place], [place] or [place]; or traveled in the [place] to [place], [place], [place], or [place]; in the last two weeks or you have been in close contact with a person diagnosed with COVID-19 in the last 2 weeks.   . Tell the health care staff about your symptoms: fever, cough and shortness of breath. . After you have been seen by a medical provider, you will be either: o Tested for (COVID-19) and discharged home on quarantine except to seek medical care if symptoms worsen, and asked to  - Stay home and avoid contact with others until you get your results (4-5 days)  - Avoid travel on public transportation if possible (such as bus, train, or airplane) or o Sent to the Emergency Department by EMS for evaluation, COVID-19 testing, and possible  admission depending on your condition and test results.  What to do if you are LOW RISK for COVID-19?  Reduce your risk of any infection by using the same precautions used for avoiding the common cold or flu:  . Wash your hands often with soap and warm water for at least 20 seconds.  If soap and water are not readily available, use an alcohol-based hand sanitizer with at least 60% alcohol.  . If coughing or sneezing, cover your mouth and nose by coughing or sneezing into the elbow areas of your shirt or coat, into a tissue or into your sleeve (not your hands). . Avoid shaking hands with others and consider head nods or verbal greetings only. . Avoid touching your eyes, nose, or mouth with unwashed hands.  . Avoid close contact with people who are sick. . Avoid places or events with large numbers of people in one location, like concerts or sporting events. . Carefully consider travel plans you have or are making. . If you are planning any travel outside or inside the [place], visit the CDC's Travelers' Health webpage for the latest health notices. . If you have some symptoms but not all symptoms, continue to monitor at home and seek medical attention if your symptoms worsen. . If you are having a medical emergency, call 911.   ADDITIONAL HEALTHCARE OPTIONS FOR PATIENTS   Telehealth / e-Visit: [email or web address]         [facility]: [phone]  [facility]: [phone]                   [facility]: [phone]   

## 2019-06-07 NOTE — Progress Notes (Signed)
[name] presents today as work in appt with Dr. Sondra Come. Pt's wife had contacted this RN to report pt had a new "heavy cough" and wanted to be worked in "to check his vital signs and lungs". This was discussed with Dr. Sondra Come and pt was offered an appt.   Pt reports that cough only happens after eating or drinking. Pt reports that cough does not happen any other time. Pt denies hemoptysis. Pt reports occasional clear, thick phlegm. Pt denies any food in phlegm.   This RN suggested to pt that speech therapy would be a good avenue and pt became argumentative that cough is not from swallowing. Pt would then state "I think its a vocal cord issue". Conveyed to pt that speech therapy could help with strengthen vocal cords.  Pt with hoarse voice and seems that he needs to project his voice. Pt states he never took Carafate during XRT despite it being offered. Pt reports "lump in throat" continues after XRT.  BP 114/78 (BP Location: Left Arm, Patient Position: Sitting, Cuff Size: Normal)   Pulse 68   Temp 98 F (36.7 C)   Resp 20   Ht 5\' 11"  (1.803 m)   Wt 199 lb 9.6 oz (90.5 kg)   SpO2 93%   BMI 27.84 kg/m   Wt Readings from Last 3 Encounters:  06/07/19 199 lb 9.6 oz (90.5 kg)  05/08/19 199 lb 2 oz (90.3 kg)  12/28/18 201 lb (91.2 kg)   Loma Sousa, RN BSN

## 2019-06-25 ENCOUNTER — Other Ambulatory Visit: Payer: Self-pay | Admitting: Radiation Oncology

## 2019-06-25 MED ORDER — SUCRALFATE 1 G PO TABS: 1.0000 g | ORAL_TABLET | Freq: Three times a day (TID) | ORAL | 1 refills | Status: DC

## 2019-06-27 ENCOUNTER — Other Ambulatory Visit: Payer: Self-pay

## 2019-06-27 ENCOUNTER — Encounter (HOSPITAL_BASED_OUTPATIENT_CLINIC_OR_DEPARTMENT_OTHER): Payer: Medicare Other | Attending: Physician Assistant | Admitting: Physician Assistant

## 2019-06-27 DIAGNOSIS — N182 Chronic kidney disease, stage 2 (mild): Secondary | ICD-10-CM | POA: Insufficient documentation

## 2019-06-27 DIAGNOSIS — I252 Old myocardial infarction: Secondary | ICD-10-CM | POA: Diagnosis not present

## 2019-06-27 DIAGNOSIS — I739 Peripheral vascular disease, unspecified: Secondary | ICD-10-CM | POA: Insufficient documentation

## 2019-06-27 DIAGNOSIS — G4733 Obstructive sleep apnea (adult) (pediatric): Secondary | ICD-10-CM | POA: Diagnosis not present

## 2019-06-27 DIAGNOSIS — J449 Chronic obstructive pulmonary disease, unspecified: Secondary | ICD-10-CM | POA: Diagnosis not present

## 2019-06-27 DIAGNOSIS — Z9981 Dependence on supplemental oxygen: Secondary | ICD-10-CM | POA: Insufficient documentation

## 2019-06-27 DIAGNOSIS — I509 Heart failure, unspecified: Secondary | ICD-10-CM | POA: Diagnosis not present

## 2019-06-27 DIAGNOSIS — I2510 Atherosclerotic heart disease of native coronary artery without angina pectoris: Secondary | ICD-10-CM | POA: Insufficient documentation

## 2019-06-27 DIAGNOSIS — M1990 Unspecified osteoarthritis, unspecified site: Secondary | ICD-10-CM | POA: Insufficient documentation

## 2019-06-27 DIAGNOSIS — Z882 Allergy status to sulfonamides status: Secondary | ICD-10-CM | POA: Insufficient documentation

## 2019-06-27 DIAGNOSIS — Z880 Allergy status to penicillin: Secondary | ICD-10-CM | POA: Diagnosis not present

## 2019-06-27 DIAGNOSIS — C3490 Malignant neoplasm of unspecified part of unspecified bronchus or lung: Secondary | ICD-10-CM | POA: Insufficient documentation

## 2019-06-27 DIAGNOSIS — Z7982 Long term (current) use of aspirin: Secondary | ICD-10-CM | POA: Insufficient documentation

## 2019-06-27 DIAGNOSIS — Z881 Allergy status to other antibiotic agents status: Secondary | ICD-10-CM | POA: Insufficient documentation

## 2019-06-27 DIAGNOSIS — S01302A Unspecified open wound of left ear, initial encounter: Secondary | ICD-10-CM | POA: Diagnosis present

## 2019-06-27 DIAGNOSIS — X58XXXA Exposure to other specified factors, initial encounter: Secondary | ICD-10-CM | POA: Insufficient documentation

## 2019-06-27 DIAGNOSIS — Z09 Encounter for follow-up examination after completed treatment for conditions other than malignant neoplasm: Secondary | ICD-10-CM | POA: Diagnosis not present

## 2019-06-27 DIAGNOSIS — Z87891 Personal history of nicotine dependence: Secondary | ICD-10-CM | POA: Diagnosis not present

## 2019-06-27 NOTE — Progress Notes (Signed)
PETRA, SARGEANT ([id number]) Visit Report for 06/27/2019 Chief Complaint Document Details Patient Name: Date of Service: [name], [name] 06/27/2019 9:00 AM Medical Record [name]:[id number] Patient Account Number: [id number] Date of Birth/Sex: Treating RN: 1935-07-24 (84 y.o. [name] Primary Care Provider: Wenda [name] Other Clinician: Referring Provider: Treating Provider/Extender:[name], [name] in Treatment: 0 Information Obtained from: Patient Chief Complaint Left eat abrasion due to oxygen tubing Electronic Signature(s) Signed: 06/27/2019 9:41:17 AM By: Worthy Keeler PA-C Entered By: Worthy Keeler on 06/27/2019 09:41:16 -------------------------------------------------------------------------------- HPI Details Patient Name: Date of Service: [name] 06/27/2019 9:00 AM Medical Record [name]:[id number] Patient Account Number: [id number] Date of Birth/Sex: Treating RN: Apr 17, 1935 (84 y.o. 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 The patient would drop on his anterior left leg this was in early September. He saw his primary physician and was using hydrogen peroxide and mupirocin. He was reviewed again by primary care on 01/18/18. Culture showed pseudomonas and he received 10 days of Levaquin which she is just completed. The patient has not been formally diagnosed with chronic venous insufficiency but he certainly has hemosiderin deposition in both legs. He does not have much in the way of edema he does not describe claudication although he is limited by COPD. He has not a diabetic. He quit smoking in 2008. He does not have a wound history Patient has a history of stage II chronic  renal failure, obstructive sleep apnea, BPH, abdominal aortic aneurysm, hyperlipidemia, coronary artery disease, peripheral vascular disease on aspirin, COPD, history of non-small cell carcinoma of the left lung, congestive heart failure, carotid artery disease and osteoporosis The patient did not even have a dopplerable dorsalis pedis but using the posterior tibial is ABI on the left leg was 1.17 02/16/2018 the patient's wound area looks a lot better. He is complaining of pain on the lateral part of his tibia. We have been using silver collagen 03/02/2018; the wound area is down quite a bit. Almost 50% healthy looking surface. Using silver collagen. 03/16/2018; the patient's wound is fully epithelialized. Still somewhat vulnerable but I think he can be discharged with a clinic with simply protecting the wound with a thick border foam or Band-Aid. He has a history of chronic venous insufficiency in the areas frail but he has no history of nonhealing wounds Readmission: 06/27/2019 upon evaluation today patient actually appears to be doing fairly well with regard to his wound on his left ear. Fortunately there does not appear to be anything actually open at this point based on what I am seeing. He has previously been seen for some issues on his lower extremities but right now this appears to be doing quite well. In fact as I look at it there is really no significant opening currently. I do believe that this is more of a pressure/irritation from a combination of his oxygen which she has to wear at night and that he sleeps on his left side. He also wears a hearing aid during the day and then couple all this with the mask and obviously he has been having some tremendous issues here. With that being said with oxygen tubing has been wrapping a Telfa pad and taking this around at the air location which has actually helped in 3 [name] since he  made the appointment to when he got in today he states that this is  doing much better than what it was in the past. Fortunately there is no signs of active infection at this time. Electronic Signature(s) Signed: 06/27/2019 10:11:02 AM By: Worthy Keeler PA-C Entered By: Worthy Keeler on 06/27/2019 10:11:02 -------------------------------------------------------------------------------- Physical Exam Details Patient Name: Date of Service: [name], [name] 06/27/2019 9:00 AM Medical Record [name]:[id number] Patient Account Number: [id number] Date of Birth/Sex: Treating RN: October 08, 1935 (84 y.o. [name] Primary Care Provider: Wenda [name] Other Clinician: Referring Provider: Treating Provider/Extender:[name], [name] in Treatment: 0 Constitutional sitting or standing blood pressure is within target range for patient.. pulse regular and within target range for patient.Marland Kitchen respirations regular, non-labored and within target range for patient.Marland Kitchen temperature within target range for patient.. Well-nourished and well-hydrated in no acute distress. Eyes conjunctiva clear no eyelid edema noted. pupils equal round and reactive to light and accommodation. Ears, [name], Mouth, and Throat no gross abnormality of ear auricles or external auditory canals. normal hearing noted during conversation. mucus membranes moist. Respiratory normal breathing without difficulty. Cardiovascular 1+ pitting edema of the bilateral lower extremities. Musculoskeletal Patient unable to walk without assistance. no significant deformity or arthritic changes, no loss or range of motion, no clubbing. Psychiatric this patient is able to make decisions and demonstrates good insight into disease process. Alert and Oriented x 3. pleasant and cooperative. Notes Upon inspection patient's wound area on his left ear posteriorly in regard to the auricle actually appears to be healed. There is brand-new skin over this area and I do not see any signs of active  infection nor anything draining currently. He tells me however that if he watches it with hot soapy water using Dial antibacterial soap that he will start draining. I think obviously this new skin can be damaged and can actually come off if to aggressively handle. I think that just using a light washing with his hands and Dial antibacterial soap would be appropriate as far as that is concerned. Otherwise I think that this does appear to be healed today in my opinion Electronic Signature(s) Signed: 06/27/2019 10:11:54 AM By: Worthy Keeler PA-C Entered By: Worthy Keeler on 06/27/2019 10:11:54 -------------------------------------------------------------------------------- Physician Orders Details Patient Name: Date of Service: [name] 06/27/2019 9:00 AM Medical Record [id number] Patient Account Number: [id number] Date of Birth/Sex: Treating RN: Jan 07, 1936 (84 y.o. [name] Primary Care Provider: Wenda [name] Other Clinician: Referring Provider: Treating Provider/Extender:[name], [name] in Treatment: 0 Verbal / Phone Orders: No Diagnosis Coding ICD-10 Coding Code Description G81.856D Unspecified open wound of left ear, initial encounter C34.90 Malignant neoplasm of unspecified part of unspecified bronchus or lung Z99.81 Dependence on supplemental oxygen Discharge From [facility] [place] [facility] Discharge from [place] Moisturizing lotion - such as Eucerin cream to back of ears daily Wound Cleansing May shower and wash wound with soap and water. Other: - wash back of ears gently with dial antibacterial soap Off-Loading Other: - continue to pad oxygen tubing with gauze or foam Electronic Signature(s) Signed: 06/27/2019 4:44:47 PM By: Worthy Keeler PA-C Signed: 06/27/2019 5:43:24 PM By: Baruch Gouty RN, BSN Entered By: Baruch Gouty on 06/27/2019  10:06:07 -------------------------------------------------------------------------------- Problem List Details Patient Name: Date of Service: [name]. 06/27/2019 9:00 AM Medical Record [id number] Patient Account Number: [id number] Date of Birth/Sex: Treating RN: 1935-08-04 (84 y.o. [name] Primary Care Provider: Wenda [name] Other Clinician:  Referring Provider: Treating Provider/Extender:[name], [name] in Treatment: 0 Active Problems ICD-10 Evaluated Encounter Code Description Active Date Today Diagnosis S01.302A Unspecified open wound of left ear, initial encounter 06/27/2019 No Yes C34.90 Malignant neoplasm of unspecified part of unspecified 06/27/2019 No Yes bronchus or lung Z99.81 Dependence on supplemental oxygen 06/27/2019 No Yes Inactive Problems Resolved Problems Electronic Signature(s) Signed: 06/27/2019 9:40:56 AM By: Worthy Keeler PA-C Previous Signature: 06/27/2019 9:39:06 AM Version By: Worthy Keeler PA-C Entered By: Worthy Keeler on 06/27/2019 09:40:55 -------------------------------------------------------------------------------- Progress Note Details Patient Name: Date of Service: [name] 06/27/2019 9:00 AM Medical Record [id number] Patient Account Number: [id number] Date of Birth/Sex: Treating RN: [birth year] (84 y.o. 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 The patient would drop on his anterior left leg this was in early September. He saw his primary physician and was using hydrogen peroxide and mupirocin. He was reviewed again by primary care  on 01/18/18. Culture showed pseudomonas and he received 10 days of Levaquin which she is just completed. The patient has not been formally diagnosed with chronic venous insufficiency but he certainly has hemosiderin deposition in both legs. He does not have much in the way of edema he does not describe claudication although he is limited by COPD. He has not a diabetic. He quit smoking in 2008. He does not have a wound history Patient has a history of stage II chronic renal failure, obstructive sleep apnea, BPH, abdominal aortic aneurysm, hyperlipidemia, coronary artery disease, peripheral vascular disease on aspirin, COPD, history of non-small cell carcinoma of the left lung, congestive heart failure, carotid artery disease and osteoporosis The patient did not even have a dopplerable dorsalis pedis but using the posterior tibial is ABI on the left leg was 1.17 02/16/2018 the patient's wound area looks a lot better. He is complaining of pain on the lateral part of his tibia. We have been using silver collagen 03/02/2018; the wound area is down quite a bit. Almost 50% healthy looking surface. Using silver collagen. 03/16/2018; the patient's wound is fully epithelialized. Still somewhat vulnerable but I think he can be discharged with a clinic with simply protecting the wound with a thick border foam or Band-Aid. He has a history of chronic venous insufficiency in the areas frail but he has no history of nonhealing wounds Readmission: 06/27/2019 upon evaluation today patient actually appears to be doing fairly well with regard to his wound on his left ear. Fortunately there does not appear to be anything actually open at this point based on what I am seeing. He has previously been seen for some issues on his lower extremities but right now this appears to be doing quite well. In fact as I look at it there is really no significant opening currently. I do believe that this is more of a  pressure/irritation from a combination of his oxygen which she has to wear at night and that he sleeps on his left side. He also wears a hearing aid during the day and then couple all this with the mask and obviously he has been having some tremendous issues here. With that being said with oxygen tubing has been wrapping a Telfa pad and taking this  around at the air location which has actually helped in 3 [name] since he made the appointment to when he got in today he states that this is doing much better than what it was in the past. Fortunately there is no signs of active infection at this time. Patient History Information obtained from Patient. Allergies penicillin (Severity: Severe, Reaction: rash, throat swelling), Sulfa (Sulfonamide Antibiotics) (Severity: Severe, Reaction: rash), Spiriva with HandiHaler (Reaction: glaucoma worsening), Symbicort (Reaction: headaches/chest congestion), brimonidine tartrate (Severity: Severe, Reaction: itching/swelling), Lyrica (Reaction: itching), Relafen (Severity: Moderate, Reaction: rash), Lumigan (Severity: Moderate, Reaction: swelling/itching), Flonase (Severity: Moderate, Reaction: [name] bleed), Advair Diskus (Reaction: intollerance), theophylline (Reaction: unknown), Qvar (Reaction: rash), Pulmicort (Reaction: dizzy), Flovent Diskus (Reaction: unknown), Tessalon Perles (Reaction: dizzy/skin reaction), Cipro (Severity: Moderate, Reaction: c. diff), Entresto (Reaction: hypotension, dizzy), valsartan (Reaction: dizzy headaches), Iodinated Contrast- Oral and IV Dye (Reaction: swelling/rash), Fosamax (Reaction: joint pain), doxycycline (Reaction: c. diff) Family History Cancer - Father,Siblings, No family history of Diabetes, Heart Disease, Hereditary Spherocytosis, Hypertension, Kidney Disease, Lung Disease, Seizures, Stroke, Thyroid Problems, Tuberculosis. Social History Former smoker - quit 2008, Marital Status - Married, Alcohol Use - Never, Drug Use  - No History, Caffeine Use - Daily - tea, coffee. Medical History Eyes Patient has history of Cataracts - bil removed, Glaucoma Denies history of Optic Neuritis Ear/[name]/Mouth/Throat Denies history of Chronic sinus problems/congestion, Middle ear problems Hematologic/Lymphatic Denies history of Anemia, Hemophilia, Human Immunodeficiency Virus, Lymphedema, Sickle Cell Disease Respiratory Patient has history of Chronic Obstructive Pulmonary Disease (COPD), Sleep Apnea - did not tolerate CPAP Denies history of Aspiration, Asthma, Pneumothorax, Tuberculosis Cardiovascular Patient has history of Congestive Heart Failure, Myocardial Infarction Denies history of Angina, Arrhythmia, Coronary Artery Disease, Deep Vein Thrombosis, Hypertension, Peripheral Arterial Disease, Peripheral Venous Disease, Phlebitis, Vasculitis Gastrointestinal Denies history of Cirrhosis , Colitis, Crohnoos, Hepatitis A, Hepatitis B, Hepatitis C Endocrine Denies history of Type I Diabetes, Type II Diabetes Genitourinary Denies history of End Stage Renal Disease Immunological Denies history of Lupus Erythematosus, Raynaudoos Integumentary (Skin) Denies history of History of Burn Musculoskeletal Patient has history of Osteoarthritis Denies history of Gout, Rheumatoid Arthritis, Osteomyelitis Neurologic Denies history of Dementia, Neuropathy, Quadriplegia, Paraplegia, Seizure Disorder Oncologic Patient has history of Received Radiation Denies history of Received Chemotherapy Psychiatric Denies history of Anorexia/bulimia, Confinement Anxiety Hospitalization/Surgery History - cardiac cath. - cervical fusion. - lumbar surgery. - right knee surgery. - appendectomy. Medical And Surgical History Notes Ear/[name]/Mouth/Throat allergic rhinitis Respiratory lung cancer Cardiovascular dyslipidemia, AAA Gastrointestinal GERD, colon polyp, HX c. diff Endocrine thyroid nodule Genitourinary erectile dysfunction,  renal cystic lesion, BPH Musculoskeletal lumbar DJD Oncologic hx lung CA Review of Systems (ROS) Constitutional Symptoms (General Health) Denies complaints or symptoms of Fatigue, Fever, Chills, Marked Weight Change. Ear/[name]/Mouth/Throat Denies complaints or symptoms of Chronic sinus problems or rhinitis. Endocrine Denies complaints or symptoms of Heat/cold intolerance. Integumentary (Skin) Complains or has symptoms of Wounds - ulcer on left ear. Neurologic Denies complaints or symptoms of Numbness/parasthesias. Psychiatric Denies complaints or symptoms of Claustrophobia, Suicidal. Objective Constitutional sitting or standing blood pressure is within target range for patient.. pulse regular and within target range for patient.Marland Kitchen respirations regular, non-labored and within target range for patient.Marland Kitchen temperature within target range for patient.. Well-nourished and well-hydrated in no acute distress. Vitals Time Taken: 9:20 AM, Height: 71 in, Source: Stated, Weight: 194 lbs, Source: Stated, BMI: 27.1, Temperature: 98.4 F, Pulse: 71 bpm, Respiratory Rate: 18 breaths/min, Blood Pressure: 110/86 mmHg. Eyes conjunctiva clear no eyelid edema noted. pupils equal round and reactive  to light and accommodation. Ears, [name], Mouth, and Throat no gross abnormality of ear auricles or external auditory canals. normal hearing noted during conversation. mucus membranes moist. Respiratory normal breathing without difficulty. Cardiovascular 1+ pitting edema of the bilateral lower extremities. Musculoskeletal Patient unable to walk without assistance. no significant deformity or arthritic changes, no loss or range of motion, no clubbing. Psychiatric this patient is able to make decisions and demonstrates good insight into disease process. Alert and Oriented x 3. pleasant and cooperative. General Notes: Upon inspection patient's wound area on his left ear posteriorly in regard to the auricle  actually appears to be healed. There is brand-new skin over this area and I do not see any signs of active infection nor anything draining currently. He tells me however that if he watches it with hot soapy water using Dial antibacterial soap that he will start draining. I think obviously this new skin can be damaged and can actually come off if to aggressively handle. I think that just using a light washing with his hands and Dial antibacterial soap would be appropriate as far as that is concerned. Otherwise I think that this does appear to be healed today in my opinion Assessment Active Problems ICD-10 Unspecified open wound of left ear, initial encounter Malignant neoplasm of unspecified part of unspecified bronchus or lung Dependence on supplemental oxygen Plan Discharge From [facility]: Discharge from [place]: Moisturizing lotion - such as Eucerin cream to back of ears daily Wound Cleansing: May shower and wash wound with soap and water. Other: - wash back of ears gently with dial antibacterial soap Off-Loading: Other: - continue to pad oxygen tubing with gauze or foam 1. I believe the patient has done extremely well using the [place] to pad the area. He is to look into getting some silicone covers for the oxygen tubing around the ear in order to help protect things ongoing so that he does not have to be by in the [place] dressings to cut and put on this area more frequently. 2. I am also can recommend that the patient continue to use a good moisturizing lotion such as Eucerin to the back of both ears daily may be even twice a day. 3. If anything changes he knows to contact the office and let me know although right now things seem to be doing quite well with regard to his left ear and the right ear is completely healed there is no signs of opening at this point. We will see the patient back for follow-up visit as  needed. Electronic Signature(s) Signed: 06/27/2019 10:12:57 AM By: Worthy Keeler PA-C Entered By: Worthy Keeler on 06/27/2019 10:12:57 -------------------------------------------------------------------------------- HxROS Details Patient Name: Date of Service: [name] 06/27/2019 9:00 AM Medical Record [id number] Patient Account Number: [id number] Date of Birth/Sex: Treating RN: 29-Sep-1935 (84 y.o. Janyth Contes Primary Care Provider: Wenda [name] Other Clinician: Referring Provider: Treating Provider/Extender:[name], [name] in Treatment: 0 Information Obtained From Patient Constitutional Symptoms (General Health) Complaints and Symptoms: Negative for: Fatigue; Fever; Chills; Marked Weight Change Ear/[name]/Mouth/Throat Complaints and Symptoms: Negative for: Chronic sinus problems or rhinitis Medical History: Negative for: Chronic sinus problems/congestion; Middle ear problems Past Medical History Notes: allergic rhinitis Endocrine Complaints and Symptoms: Negative for: Heat/cold intolerance Medical History: Negative for: Type I Diabetes; Type II Diabetes Past Medical History Notes: thyroid nodule Integumentary (Skin) Complaints and Symptoms: Positive for: Wounds - ulcer on left ear Medical History:  Negative for: History of Burn Neurologic Complaints and Symptoms: Negative for: Numbness/parasthesias Medical History: Negative for: Dementia; Neuropathy; Quadriplegia; Paraplegia; Seizure Disorder Psychiatric Complaints and Symptoms: Negative for: Claustrophobia; Suicidal Medical History: Negative for: Anorexia/bulimia; Confinement Anxiety Eyes Medical History: Positive for: Cataracts - bil removed; Glaucoma Negative for: Optic Neuritis Hematologic/Lymphatic Medical History: Negative for: Anemia; Hemophilia; Human Immunodeficiency Virus; Lymphedema; Sickle Cell Disease Respiratory Medical History: Positive for: Chronic  Obstructive Pulmonary Disease (COPD); Sleep Apnea - did not tolerate CPAP Negative for: Aspiration; Asthma; Pneumothorax; Tuberculosis Past Medical History Notes: lung cancer Cardiovascular Medical History: Positive for: Congestive Heart Failure; Myocardial Infarction Negative for: Angina; Arrhythmia; Coronary Artery Disease; Deep Vein Thrombosis; Hypertension; Peripheral Arterial Disease; Peripheral Venous Disease; Phlebitis; Vasculitis Past Medical History Notes: dyslipidemia, AAA Gastrointestinal Medical History: Negative for: Cirrhosis ; Colitis; Crohns; Hepatitis A; Hepatitis B; Hepatitis C Past Medical History Notes: GERD, colon polyp, HX c. diff Genitourinary Medical History: Negative for: End Stage Renal Disease Past Medical History Notes: erectile dysfunction, renal cystic lesion, BPH Immunological Medical History: Negative for: Lupus Erythematosus; Raynauds Musculoskeletal Medical History: Positive for: Osteoarthritis Negative for: Gout; Rheumatoid Arthritis; Osteomyelitis Past Medical History Notes: lumbar DJD Oncologic Medical History: Positive for: Received Radiation Negative for: Received Chemotherapy Past Medical History Notes: hx lung CA HBO Extended History Items Eyes: Eyes: Cataracts Glaucoma Immunizations Pneumococcal Vaccine: Received Pneumococcal Vaccination: Yes Implantable Devices None Hospitalization / Surgery History Type of Hospitalization/Surgery cardiac cath cervical fusion lumbar surgery right knee surgery appendectomy Family and Social History Cancer: Yes - Father,Siblings; Diabetes: No; Heart Disease: No; Hereditary Spherocytosis: No; Hypertension: No; Kidney Disease: No; Lung Disease: No; Seizures: No; Stroke: No; Thyroid Problems: No; Tuberculosis: No; Former smoker - quit 2008; Marital Status - Married; Alcohol Use: Never; Drug Use: No History; Caffeine Use: Daily - tea, coffee; Financial Concerns: No; Food, Clothing or Shelter  Needs: No; Support System Lacking: No; Transportation Concerns: No Electronic Signature(s) Signed: 06/27/2019 4:44:47 PM By: Worthy Keeler PA-C Signed: 06/27/2019 5:43:43 PM By: Levan Hurst RN, BSN Entered By: Levan Hurst on 06/27/2019 09:27:28 -------------------------------------------------------------------------------- [place] Details Patient Name: Date of Service: [name] 06/27/2019 Medical Record [id number] Patient Account Number: [id number] Date of Birth/Sex: Treating RN: December 19, 1935 (84 y.o. [name] Primary Care Provider: Wenda [name] Other Clinician: Referring Provider: Treating Provider/Extender:[name], [name] in Treatment: 0 Diagnosis Coding ICD-10 Codes Code Description S01.302A Unspecified open wound of left ear, initial encounter C34.90 Malignant neoplasm of unspecified part of unspecified bronchus or lung Z99.81 Dependence on supplemental oxygen Facility Procedures CPT4 Code: [id number] Description: 99213 - WOUND CARE VISIT-LEV 3 EST PT Modifier: Quantity: 1 Physician Procedures CPT4 Code Description: [id number] [place] - WC PHYS LEVEL 3 - EST PT ICD-10 Diagnosis Description S82.707E Unspecified open wound of left ear, initial encounter C34.90 Malignant neoplasm of unspecified part of unspecified Z99.81 Dependence on supplemental  oxygen Modifier: bronchus or lu Quantity: 1 ng Electronic Signature(s) Signed: 06/27/2019 4:44:47 PM By: Worthy Keeler PA-C Signed: 06/27/2019 5:43:24 PM By: Baruch Gouty RN, BSN Previous Signature: 06/27/2019 10:13:45 AM Version By: Worthy Keeler PA-C Entered By: Baruch Gouty on 06/27/2019 10:14:26

## 2019-06-27 NOTE — Progress Notes (Signed)
BRECCAN, GALANT ([id number]) Visit Report for 06/27/2019 Allergy List Details Patient Name: Date of Service: [name], [name] 06/27/2019 9:00 AM Medical Record [id number] Patient Account Number: [id number] Date of Birth/Sex: Treating RN: 1935/08/23 (84 y.o. Janyth Contes Primary Care Shelena Castelluccio: Wenda Low Other Clinician: Referring Leinani Lisbon: Treating Jahleel Stroschein/Extender:Stone III, Charlett Nose, Karrar Weeks in Treatment: 0 Allergies Active Allergies penicillin Reaction: rash, throat swelling Severity: Severe Sulfa (Sulfonamide Antibiotics) Reaction: rash Severity: Severe Spiriva with HandiHaler Reaction: glaucoma worsening Symbicort Reaction: headaches/chest congestion brimonidine tartrate Reaction: itching/swelling Severity: Severe Lyrica Reaction: itching Relafen Reaction: rash Severity: Moderate Lumigan Reaction: swelling/itching Severity: Moderate Flonase Reaction: nose bleed Severity: Moderate Advair Diskus Reaction: intollerance theophylline Reaction: unknown Qvar Reaction: rash Pulmicort Reaction: dizzy Flovent Diskus Reaction: unknown Tessalon Perles Reaction: dizzy/skin reaction Cipro Reaction: c. diff Severity: Moderate Entresto Reaction: hypotension, dizzy valsartan Reaction: dizzy headaches Iodinated Contrast- Oral and IV Dye Reaction: swelling/rash Fosamax Reaction: joint pain doxycycline Reaction: c. diff Allergy Notes Electronic Signature(s) Signed: 06/27/2019 5:43:43 PM By: Levan Hurst RN, BSN Entered By: Levan Hurst on 06/27/2019 09:20:58 -------------------------------------------------------------------------------- Arrival Information Details Patient Name: Date of Service: [name] 06/27/2019 9:00 AM Medical Record [id number] Patient Account Number: [id number] Date of Birth/Sex: Treating RN: 08/02/1935 (84 y.o. Janyth Contes Primary Care Hannelore Bova: Wenda Low Other Clinician: Referring  Zaylyn Bergdoll: Treating Jasiya Markie/Extender:Stone III, Charlett Nose, Karrar Weeks in Treatment: 0 Visit Information Patient Arrived: Ambulatory Arrival Time: 09:15 Accompanied By: wife Transfer Assistance: None Patient Identification Verified: Yes Secondary Verification Process Yes Completed: Patient Requires Transmission-Based No Precautions: Patient Has Alerts: No History Since Last Visit Added or deleted any medications: No Any new allergies or adverse reactions: No Had a fall or experienced change in activities of daily living that may affect risk of falls: No Signs or symptoms of abuse/neglect since last visito No Hospitalized since last visit: No Implantable device outside of the clinic excluding cellular tissue based products placed in the center since last visit: No Pain Present Now: No Electronic Signature(s) Signed: 06/27/2019 5:43:43 PM By: Levan Hurst RN, BSN Entered By: Levan Hurst on 06/27/2019 09:20:19 -------------------------------------------------------------------------------- Clinic Level of Care Assessment Details Patient Name: Date of Service: [name] 06/27/2019 9:00 AM Medical Record [id number] Patient Account Number: [id number] Date of Birth/Sex: Treating RN: 13-Sep-1935 (84 y.o. [name] Primary Care Samule Life: Wenda Low Other Clinician: Referring Ashtin Melichar: Treating Dianelly Ferran/Extender:Stone III, Charlett Nose, Karrar Weeks in Treatment: 0 Clinic Level of Care Assessment Items TOOL 2 Quantity Score []  - Use when only an EandM is performed on the INITIAL visit 0 ASSESSMENTS - Nursing Assessment / Reassessment X - General Physical Exam (combine w/ comprehensive assessment (listed just below) 1 20 when performed on new pt. evals) X - Comprehensive Assessment (HX, ROS, Risk Assessments, Wounds Hx, etc.) 1 25 ASSESSMENTS - Wound and Skin Assessment / Reassessment X - Simple Wound Assessment / Reassessment - one wound 1 5 []  -  Complex Wound Assessment / Reassessment - multiple wounds 0 []  - Dermatologic / Skin Assessment (not related to wound area) 0 ASSESSMENTS - Ostomy and/or Continence Assessment and Care []  - Incontinence Assessment and Management 0 []  - Ostomy Care Assessment and Management (repouching, etc.) 0 PROCESS - Coordination of Care X - Simple Patient / Family Education for ongoing care 1 15 []  - Complex (extensive) Patient / Family Education for ongoing care 0 X - Staff obtains Consents, Records, Test Results / Process Orders 1 10 []  - Staff telephones HHA, Nursing Homes / Clarify orders / etc 0 []  -  Routine Transfer to another Facility (non-emergent condition) 0 []  - Routine Hospital Admission (non-emergent condition) 0 []  - New Admissions / Biomedical engineer / Ordering NPWT, Apligraf, etc. 0 []  - Emergency Hospital Admission (emergent condition) 0 X - Simple Discharge Coordination 1 10 []  - Complex (extensive) Discharge Coordination 0 PROCESS - Special Needs []  - Pediatric / Minor Patient Management 0 []  - Isolation Patient Management 0 []  - Hearing / Language / Visual special needs 0 []  - Assessment of Community assistance (transportation, D/C planning, etc.) 0 []  - Additional assistance / Altered mentation 0 []  - Support Surface(s) Assessment (bed, cushion, seat, etc.) 0 INTERVENTIONS - Wound Cleansing / Measurement X - Wound Imaging (photographs - any number of wounds) 1 5 []  - Wound Tracing (instead of photographs) 0 X - Simple Wound Measurement - one wound 1 5 []  - Complex Wound Measurement - multiple wounds 0 X - Simple Wound Cleansing - one wound 1 5 []  - Complex Wound Cleansing - multiple wounds 0 INTERVENTIONS - Wound Dressings X - Small Wound Dressing one or multiple wounds 1 10 []  - Medium Wound Dressing one or multiple wounds 0 []  - Large Wound Dressing one or multiple wounds 0 []  - Application of Medications - injection 0 INTERVENTIONS - Miscellaneous []  - External  ear exam 0 []  - Specimen Collection (cultures, biopsies, blood, body fluids, etc.) 0 []  - Specimen(s) / Culture(s) sent or taken to Lab for analysis 0 []  - Patient Transfer (multiple staff / Civil [employer] fast streamer / Similar devices) 0 []  - Simple Staple / Suture removal (25 or less) 0 []  - Complex Staple / Suture removal (26 or more) 0 []  - Hypo / Hyperglycemic Management (close monitor of Blood Glucose) 0 []  - Ankle / Brachial Index (ABI) - do not check if billed separately 0 Has the patient been seen at the hospital within the last three years: Yes Total Score: 110 Level Of Care: New/Established - Level 3 Electronic Signature(s) Signed: 06/27/2019 5:43:24 PM By: Baruch Gouty RN, BSN Entered By: Baruch Gouty on 06/27/2019 09:58:06 -------------------------------------------------------------------------------- Encounter Discharge Information Details Patient Name: Date of Service: Irene Pap A. 06/27/2019 9:00 AM Medical Record [id number] Patient Account Number: [id number] Date of Birth/Sex: Treating RN: 10/28/1935 (84 y.o. [name] Primary Care Inaya Gillham: Wenda Low Other Clinician: Referring Farah Lepak: Treating Sevon Rotert/Extender:Stone III, Charlett Nose, Fransisca Connors in Treatment: 0 Encounter Discharge Information Items Discharge Condition: Stable Ambulatory Status: Ambulatory Discharge Destination: Home Transportation: Private Auto Accompanied By: spouse Schedule Follow-up Appointment: Yes Clinical Summary of Care: Patient Declined Electronic Signature(s) Signed: 06/27/2019 5:43:24 PM By: Baruch Gouty RN, BSN Entered By: Baruch Gouty on 06/27/2019 10:09:01 -------------------------------------------------------------------------------- Pain Assessment Details Patient Name: Date of Service: [name] 06/27/2019 9:00 AM Medical Record [id number] Patient Account Number: [id number] Date of Birth/Sex: Treating RN: 1935/11/30 (84 y.o. Janyth Contes Primary Care Rayelle Armor: Wenda Low Other Clinician: Referring Marquise Wicke: Treating Tekelia Kareem/Extender:Stone III, Charlett Nose, Karrar Weeks in Treatment: 0 Active Problems Location of Pain Severity and Description of Pain Patient Has Paino No Site Locations Pain Management and Medication Current Pain Management: Electronic Signature(s) Signed: 06/27/2019 5:43:43 PM By: Levan Hurst RN, BSN Entered By: Levan Hurst on 06/27/2019 09:30:35 -------------------------------------------------------------------------------- Patient/Caregiver Education Details Patient Name: [name] 4/14/2021andnbsp9:00 Date of Service: AM Medical Record [id number] Number: Patient Account Number: [id number] Treating RN: Date of Birth/Gender: September 01, 1935 (83 y.o. [name]) Other Clinician: Primary Care Physician: Wenda Low Treating Worthy Keeler Referring Physician: Physician/Extender: Andres Labrum in Treatment: 0  Education Assessment Education Provided To: Patient Education Topics Provided Wound/Skin Impairment: Handouts: Caring for Your Ulcer, Skin Care Do's and Dont's Methods: Explain/Verbal, Printed Responses: Reinforcements needed, State content correctly Electronic Signature(s) Signed: 06/27/2019 5:43:24 PM By: Baruch Gouty RN, BSN Entered By: Baruch Gouty on 06/27/2019 09:56:56 -------------------------------------------------------------------------------- [place] Details Patient Name: Date of Service: [name] 06/27/2019 9:00 AM Medical Record [id number] Patient Account Number: [id number] Date of Birth/Sex: Treating RN: [birth year] (84 y.o. Janyth Contes Primary Care Virgle Arth: Wenda Low Other Clinician: Referring Ashani Pumphrey: Treating Mahasin Riviere/Extender:Stone III, Charlett Nose, Karrar Weeks in Treatment: 0 Vital Signs Time Taken: 09:20 Temperature (F): 98.4 Height (in): 71 Pulse (bpm): 71 Source: Stated Respiratory  Rate (breaths/min): 18 Weight (lbs): 194 Blood Pressure (mmHg): 110/86 Source: Stated Reference Range: 80 - 120 mg / dl Body Mass Index (BMI): 27.1 Electronic Signature(s) Signed: 06/27/2019 5:43:43 PM By: Levan Hurst RN, BSN Entered By: Levan Hurst on 06/27/2019 09:20:50

## 2019-06-27 NOTE — Progress Notes (Signed)
[name], [name] ([id number]) Visit Report for 06/27/2019 Abuse/Suicide Risk Screen Details Patient Name: Date of Service: LOC, FEINSTEIN 06/27/2019 9:00 AM Medical Record [id number] Patient Account Number: [id number] Date of Birth/Sex: Treating RN: [birth year] (84 y.o. Janyth Contes Primary Care Shulem Mader: Wenda Low Other Clinician: Referring Johnnathan Hagemeister: Treating Willma Obando/Extender:Stone III, Charlett Nose, Karrar Weeks in Treatment: 0 Abuse/Suicide Risk Screen Items Answer ABUSE RISK SCREEN: Has anyone close to you tried to hurt or harm you recentlyo No Do you feel uncomfortable with anyone in your familyo No Has anyone forced you do things that you didnt want to doo No Electronic Signature(s) Signed: 06/27/2019 5:43:43 PM By: Levan Hurst RN, BSN Entered By: Levan Hurst on 06/27/2019 09:27:34 -------------------------------------------------------------------------------- Activities of Daily Living Details Patient Name: Date of Service: [name], [name] 06/27/2019 9:00 AM Medical Record [id number] Patient Account Number: [id number] Date of Birth/Sex: Treating RN: [birth year] (84 y.o. Janyth Contes Primary Care Freedom Peddy: Wenda Low Other Clinician: Referring Jontae Sonier: Treating Jep Dyas/Extender:Stone III, Charlett Nose, Karrar Weeks in Treatment: 0 Activities of Daily Living Items Answer Activities of Daily Living (Please select one for each item) Drive Automobile Completely Able Take Medications Completely Able Use Telephone Completely Able Care for Appearance Completely Able Use Toilet Completely Able Bath / Shower Completely Able Dress Self Completely Able Feed Self Completely Able Walk Completely Able Get In / Out Bed Completely Able Housework Completely Able Prepare Meals Completely [place] for Self Completely Able Electronic Signature(s) Signed: 06/27/2019 5:43:43 PM By: Levan Hurst RN, BSN Entered By:  Levan Hurst on 06/27/2019 09:28:04 -------------------------------------------------------------------------------- Education Screening Details Patient Name: Date of Service: [name] 06/27/2019 9:00 AM Medical Record [id number] Patient Account Number: [id number] Date of Birth/Sex: Treating RN: 01-09-1936 (84 y.o. Janyth Contes Primary Care Eldean Klatt: Wenda Low Other Clinician: Referring Tiawanna Luchsinger: Treating Azharia Surratt/Extender:Stone III, Charlett Nose, Fransisca Connors in Treatment: 0 Primary Learner Assessed: Patient Learning Preferences/Education Level/Primary Language Learning Preference: Explanation, Demonstration, Printed Material Highest Education Level: College or Above Preferred Language: English Cognitive Barrier Language Barrier: No Translator Needed: No Memory Deficit: No Emotional Barrier: No Cultural/Religious Beliefs Affecting Medical Care: No Physical Barrier Impaired Vision: No Impaired Hearing: No Decreased Hand dexterity: No Knowledge/Comprehension Knowledge Level: High Comprehension Level: High Ability to understand written High instructions: Ability to understand verbal High instructions: Motivation Anxiety Level: Calm Cooperation: Cooperative Education Importance: Acknowledges Need Interest in Health Problems: Asks Questions Perception: Coherent Willingness to Engage in Self- High Management Activities: Readiness to Engage in Self- High Management Activities: Electronic Signature(s) Signed: 06/27/2019 5:43:43 PM By: Levan Hurst RN, BSN Entered By: Levan Hurst on 06/27/2019 09:28:35 -------------------------------------------------------------------------------- Fall Risk Assessment Details Patient Name: Date of Service: [name] 06/27/2019 9:00 AM Medical Record [id number] Patient Account Number: [id number] Date of Birth/Sex: Treating RN: Mar 04, 1936 (84 y.o. Janyth Contes Primary Care Jaquelyn Sakamoto:  Wenda Low Other Clinician: Referring Girlie Veltri: Treating Georgianna Band/Extender:Stone III, Charlett Nose, Karrar Weeks in Treatment: 0 Fall Risk Assessment Items Have you had 2 or more falls in the last 12 monthso 0 No Have you had any fall that resulted in injury in the last 12 monthso 0 No FALLS RISK SCREEN History of falling - immediate or within 3 months 0 No Secondary diagnosis (Do you have 2 or more medical diagnoseso) 15 Yes Ambulatory aid None/bed rest/wheelchair/nurse 0 Yes Crutches/cane/walker 0 No Furniture 0 No Intravenous therapy Access/Saline/Heparin Lock 0 No Weak (short steps with or without shuffle, stooped but able to lift head 0 No while walking, may seek  support from furniture) Impaired (short steps with shuffle, may have difficulty arising from chair, 0 No head down, impaired balance) Mental Status Oriented to own ability 0 Yes Overestimates or forgets limitations 0 No Risk Level: Low Risk Score: 15 Electronic Signature(s) Signed: 06/27/2019 5:43:43 PM By: Levan Hurst RN, BSN Entered By: Levan Hurst on 06/27/2019 09:28:58 -------------------------------------------------------------------------------- Nutrition Risk Screening Details Patient Name: Date of Service: [name] 06/27/2019 9:00 AM Medical Record [id number] Patient Account Number: [id number] Date of Birth/Sex: Treating RN: Apr 30, 1935 (84 y.o. Janyth Contes Primary Care Emmajane Altamura: Wenda Low Other Clinician: Referring Inella Kuwahara: Treating Mahlet Jergens/Extender:Stone III, Charlett Nose, Karrar Weeks in Treatment: 0 Height (in): 71 Weight (lbs): 194 Body Mass Index (BMI): 27.1 Nutrition Risk Screening Items Score Screening NUTRITION RISK SCREEN: I have an illness or condition that made me change the kind and/or 0 No amount of food I eat I eat fewer than two meals per day 0 No I eat few fruits and vegetables, or milk products 0 No I have three or more drinks of beer, liquor or  wine almost every day 0 No I have tooth or mouth problems that make it hard for me to eat 0 No I don't always have enough money to buy the food I need 0 No I eat alone most of the time 0 No I take three or more different prescribed or over-the-counter drugs a day 1 Yes 0 No Without wanting to, I have lost or gained 10 pounds in the last six months I am not always physically able to shop, cook and/or feed myself 0 No Nutrition Protocols Good Risk Protocol 0 No interventions needed Moderate Risk Protocol High Risk Proctocol Risk Level: Good Risk Score: 1 Electronic Signature(s) Signed: 06/27/2019 5:43:43 PM By: Levan Hurst RN, BSN Entered By: Levan Hurst on 06/27/2019 09:29:10

## 2019-07-13 ENCOUNTER — Ambulatory Visit
Admission: RE | Admit: 2019-07-13 | Discharge: 2019-07-13 | Disposition: A | Discharge status: Home / Self Care | Payer: Medicare Other | Source: Ambulatory Visit | Attending: Internal Medicine | Admitting: Internal Medicine

## 2019-07-13 ENCOUNTER — Other Ambulatory Visit: Payer: Self-pay

## 2019-07-13 DIAGNOSIS — M810 Age-related osteoporosis without current pathological fracture: Secondary | ICD-10-CM

## 2019-08-02 ENCOUNTER — Other Ambulatory Visit: Payer: Self-pay

## 2019-08-02 ENCOUNTER — Ambulatory Visit (HOSPITAL_COMMUNITY)
Admission: RE | Admit: 2019-08-02 | Discharge: 2019-08-02 | Disposition: A | Discharge status: Home / Self Care | Payer: Medicare Other | Source: Ambulatory Visit | Attending: Radiation Oncology | Admitting: Radiation Oncology

## 2019-08-02 DIAGNOSIS — I714 Abdominal aortic aneurysm, without rupture: Secondary | ICD-10-CM | POA: Insufficient documentation

## 2019-08-02 DIAGNOSIS — Z79899 Other long term (current) drug therapy: Secondary | ICD-10-CM | POA: Insufficient documentation

## 2019-08-02 DIAGNOSIS — C3492 Malignant neoplasm of unspecified part of left bronchus or lung: Secondary | ICD-10-CM | POA: Diagnosis not present

## 2019-08-02 DIAGNOSIS — R918 Other nonspecific abnormal finding of lung field: Secondary | ICD-10-CM | POA: Insufficient documentation

## 2019-08-02 LAB — GLUCOSE, CAPILLARY: Glucose-Capillary: 137 mg/dL — ABNORMAL HIGH (ref 70–99)

## 2019-08-02 MED ORDER — FLUDEOXYGLUCOSE F - 18 (FDG) INJECTION
9.6000 | Freq: Once | INTRAVENOUS | Status: AC
  Administered 2019-08-02: 9.6 via INTRAVENOUS

## 2019-08-05 NOTE — Progress Notes (Signed)
Radiation Oncology         [phone] ________________________________  Name: [name] MRN: [id number]  Date: 08/06/2019  DOB: 1935/04/03   Follow-Up Visit Note  CC: Wenda Low, MD  Wenda Low, MD    ICD-10-CM   1. Recurrent cancer of left lung of unknown cell type (HCC)  C34.92   2. Non-small cell carcinoma of left lung, stage 1 (HCC)  C34.92     Diagnosis: Recurrent non-small cell lung cancer  Interval Since Last Radiation: Four months, one week, and four days.  Radiation Treatment Dates: 02/13/2019 through 03/28/2019 Site Technique Total Dose (Gy) Dose per Fx (Gy) Completed Fx Beam Energies  Lung, Left: Lung_Lt IMRT 60/60 2 30/30 6X    Narrative:  The patient returns today to discuss PET scan results from 08/02/2019. There was some interval progression of hypermetabolism associated with the anteromedial soft tissue in the left lung apex that was concerning for progressive disease. There was also a new hypermetabolic para-hilar right upper lobe nodule associated with new focal hypermetabolism in the right hilum, features concerning for metastatic involvement. There was also noted to be a new 7 mm nodule of the posterior left costophrenic sulcus without hypermetabolism that may be related to atelectasis; follow-up was recommended. Finally, there was asymmetric FDG uptake in the right vocal cord, which could be seen if the patient was talking after radiopharmaceutical injections, although unilateral uptake would not be expected unless there was paralysis of one of the vocal cords (left-sided in this particular case). In the absence of vocal cord paralysis, direct visualization is warranted to exclude mucosal lesion on the right.  On review of systems, he reports difficulty swallowing liquids for which he is taking Carafate and a daily cough that varies. He uses supplemental oxygen at night and as needed. He denies pain, fatigue, and dyspnea.  He reports his hoarseness has  improved since last follow-up with me.  He has seen Dr. Wilburn Cornelia for this issue was noted to have a paralyzed left vocal cord.   ALLERGIES:  is allergic to entresto [sacubitril-valsartan]; iodinated diagnostic agents; penicillins; lumigan [bimatoprost]; lyrica [pregabalin]; sulfonamide derivatives; symbicort [budesonide-formoterol fumarate]; valsartan; advair diskus [fluticasone-salmeterol]; brimonidine tartrate; ciprofloxacin; flovent diskus [fluticasone propionate (inhal)]; pulmicort [budesonide]; qvar [beclomethasone]; spiriva [tiotropium bromide monohydrate]; tessalon [benzonatate]; bacitracin; flonase [fluticasone propionate]; relafen [nabumetone]; and tape.  Meds: Current Outpatient Medications  Medication Sig Dispense Refill  . albuterol (PROAIR HFA) 108 (90 BASE) MCG/ACT inhaler Inhale 2 puffs into the lungs every 6 (six) hours as needed for wheezing or shortness of breath.     Marland Kitchen albuterol (PROVENTIL) (2.5 MG/3ML) 0.083% nebulizer solution Take 2.5 mg by nebulization every 6 (six) hours as needed for wheezing or shortness of breath.    Marland Kitchen alendronate (FOSAMAX) 70 MG tablet Take 70 mg by mouth once a week. Take with a full glass of water on an empty stomach.    Marland Kitchen aspirin 81 MG tablet Take 81 mg by mouth daily.     Marland Kitchen atorvastatin (LIPITOR) 20 MG tablet daily.    . dorzolamide-timolol (COSOPT) 22.3-6.8 MG/ML ophthalmic solution Place 1 drop into both eyes 2 (two) times daily.    Marland Kitchen gabapentin (NEURONTIN) 300 MG capsule Take 600 mg by mouth 2 (two) times daily.     Marland Kitchen latanoprost (XALATAN) 0.005 % ophthalmic solution Place 1 drop into both eyes at bedtime.     . metoprolol succinate (TOPROL-XL) 25 MG 24 hr tablet Take 25 mg by mouth every morning.     Marland Kitchen  nitroGLYCERIN (NITROSTAT) 0.4 MG SL tablet Place 0.4 mg under the tongue every 5 (five) minutes as needed for chest pain. For chest pain     . OXYGEN 2lpm with sleep only    . sucralfate (CARAFATE) 1 g tablet Take 1 tablet (1 g total) by mouth 4  (four) times daily -  with meals and at bedtime. 30 mins prior to meals, crush and dissolve in 10 mL of warm water prior to swallowing 120 tablet 1  . Tamsulosin HCl (FLOMAX) 0.4 MG CAPS Take 0.4 mg by mouth every evening.     . timolol (TIMOPTIC) 0.5 % ophthalmic solution     . CALCIUM-MAGNESIUM-VITAMIN D PO Take 1 tablet by mouth daily.    . methocarbamol (ROBAXIN) 750 MG tablet Take 750 mg by mouth as needed for muscle spasms.     No current facility-administered medications for this encounter.    Physical Findings: The patient is in no acute distress. Patient is alert and oriented.  height is 5\' 11"  (1.803 m) and weight is 195 lb 9.6 oz (88.7 kg). His temperature is 97.8 F (36.6 C). His blood pressure is 116/73 and his pulse is 70. His respiration is 20 and oxygen saturation is 96%.  Lungs are clear to auscultation bilaterally. Heart has regular rate and rhythm. No palpable cervical, supraclavicular, or axillary adenopathy. Abdomen soft, non-tender, normal bowel sounds.   Lab Findings: Lab Results  Component Value Date   WBC 11.6 (H) 09/26/2018   HGB 14.7 09/26/2018   HCT 44.0 09/26/2018   MCV 93 09/26/2018   PLT 151 09/26/2018    Radiographic Findings: NM PET Image Restag (PS) Skull Base To Thigh  Result Date: 08/02/2019 CLINICAL DATA:  Subsequent treatment strategy for non-small-cell lung cancer. EXAM: NUCLEAR MEDICINE PET SKULL BASE TO THIGH TECHNIQUE: 9.7 mCi F-18 FDG was injected intravenously. Full-ring PET imaging was performed from the skull base to thigh after the radiotracer. CT data was obtained and used for attenuation correction and anatomic localization. Fasting blood glucose: 137 mg/dl COMPARISON:  01/10/2019. FINDINGS: Mediastinal blood pool activity: SUV max 3.3 Liver activity: SUV max N/A NECK: Hypermetabolism noted right vocal cord. There is some medial deviation of the posterior left focal cord. This appearance could be related to phonation after FDG injection  assuming paralysis of the left cord. This is new in the interval since prior study. Direct visualization may be warranted to exclude mucosal lesion. Stable appearance of the previously characterized 13 mm right thyroid nodule with stable hypermetabolism ( SUV max = 8.8 today compared to 8.6 previously. Incidental CT findings: Tiny right middle lobe pulmonary nodule seen on the previous study is no longer evident. CHEST: Interval progression of the hypermetabolism associated with the nodular soft tissue in the anterior left lung apex. SUV max = 6.6 today compared to 4.0 previously. Qualitatively, the abnormal soft tissue in this region on CT imaging is similar between the 2 exams. 7 mm right upper lobe nodule (image 26/8) is new in the interval with demonstrable hypermetabolism ( SUV max = 6.0). New focal hypermetabolism is identified in the right hilum with SUV max = 11.6. Although assessment hindered by lack of intravenous contrast on CT data for attenuation correction today, activity likely corresponds to a 9 mm short axis hilar node visible on 73/4. 12 mm subpleural nodule in the paraspinal right lower lobe (62/8) is stable in size and without hypermetabolism. 7 mm nodule posterior left costophrenic sulcus (56/8) is new in the interval and without  hypermetabolism. This could be atelectatic. Incidental CT findings: Coronary artery calcification is evident. Atherosclerotic calcification is noted in the wall of the thoracic aorta. Enlargement of the pulmonary outflow tract raises the question of pulmonary arterial hypertension. ABDOMEN/PELVIS: No abnormal hypermetabolic activity within the liver, pancreas, adrenal glands, or spleen. No hypermetabolic lymph nodes in the abdomen or pelvis. Incidental CT findings: Similar appearance bilateral renal cysts atherosclerotic disease noted in the abdominal aorta which measures up to 3.8 cm maximum diameter, not substantially changed from 3.7 cm when I remeasure in a similar  fashion on the prior study. Small left groin hernia contains only fat. SKELETON: No focal hypermetabolic activity to suggest skeletal metastasis. Incidental CT findings: Sequelae of prior cervical and lumbar fusion. IMPRESSION: 1. Interval progression of hypermetabolism associated with the anteromedial soft tissue in the left lung apex, concerning for progressive disease. 2. New hypermetabolic parahilar right upper lobe nodule associated with new focal hypermetabolism in the right hilum, features concerning for metastatic involvement. 3. New 7 mm nodule posterior left costophrenic sulcus without hypermetabolism. This may be related to atelectasis. Attention on follow-up recommended. 4. Asymmetric FDG uptake in the right vocal cord. Focal cord activity can be seen if a patient is talking after radiopharmaceutical injection although unilateral uptake would not be expected unless there is paralysis of one of the vocal cords (left-side in this particular case). In the absence of vocal cord paralysis, direct visualization may be warranted to exclude mucosal lesion on the right. 5. Abdominal aortic aneurysm.  Attention on follow-up recommended. Electronically Signed   By: Misty Stanley M.D.   On: 08/02/2019 13:40   DG BONE DENSITY (DXA)  Result Date: 07/13/2019 EXAM: DUAL X-RAY ABSORPTIOMETRY (DXA) FOR BONE MINERAL DENSITY IMPRESSION: Referring Physician:  Wenda Low Your patient completed a BMD test using Lunar IDXA DXA system ( analysis version: 16 ) manufactured by [employer]. Technologist: WLS PATIENT: Name: Laquincy, [place] Patient ID: [id number] Birth Date: 1935-06-16 Height: 71.0 in. Sex: Male Measured: 07/13/2019 Weight: 194.2 lbs. Indications: Advanced Age, Caucasian, Gabapentin, History of Fracture (Adult) (V15.51), Rheumatoid Arthritis (714.0) Fractures: Finger Treatments: Calcitonin, Vitamin D (E933.5) ASSESSMENT: The BMD measured at Femur Neck Left is 0.736 g/cm2 with a T-score of -2.2. This patient is  considered osteopenic according to [facility]) criteria. The scan quality is good. Lumbar spine was not utilized due to surgical hardware. Per the official positions of the ISCD, it is not possible to quantitatively compare BMD or calculate an [facility] between exams done at different facilities. Site Region Measured Date Measured Age YA BMD Significant CHANGE T-score DualFemur Neck Left 07/13/2019 81.3 -2.2 0.736 g/cm2 DualFemur Total Mean 07/13/2019 81.3 -1.0 0.882 g/cm2 Left Forearm Radius 33% 07/13/2019 81.3 0.0 1.003 g/cm2 World Health Organization [facility]) criteria for post-menopausal, Caucasian Women: Normal       T-score at or above -1 SD Osteopenia   T-score between -1 and -2.5 SD Osteoporosis T-score at or below -2.5 SD RECOMMENDATION: 1. All patients should optimize calcium and vitamin D intake. 2. Consider FDA approved medical therapies in postmenopausal women and men aged 8 years and older, based on the following: a. A hip or vertebral (clinical or morphometric) fracture b. T- score < or = -2.5 at the femoral neck or spine after appropriate evaluation to exclude secondary causes c. Low bone mass (T-score between -1.0 and -2.5 at the femoral neck or spine) and a 10 year probability of a hip fracture > or = 3% or a 10 year  probability of a major osteoporosis-related fracture > or = 20% based on the [place]-adapted WHO algorithm d. Clinician judgment and/or patient preferences may indicate treatment for people with 10-year fracture probabilities above or below these levels FOLLOW-UP: Patients with diagnosis of osteoporosis or at high risk for fracture should have regular bone mineral density tests. For patients eligible for Medicare, routine testing is allowed once every 2 years. The testing frequency can be increased to one year for patients who have rapidly progressing disease, those who are receiving or discontinuing medical therapy to restore bone mass, or have additional risk factors. I have  reviewed this report and agree with the above findings. [facility] FRAX* 10-year Probability of Fracture Based on femoral neck BMD: DualFemur (Left) Major Osteoporotic Fracture: 20.1% Hip Fracture:                9.0% Population:                  [place] (Caucasian) Risk Factors: History of Fracture (Adult) (V15.51), Rheumatoid Arthritis (714.0) *FRAX is a Materials engineer of the [employer] for Metabolic Bone Disease, a [place] (WHO) [facility]. ASSESSMENT: The probability of a major osteoporotic fracture is 20.1 % within the next ten years. The probability of a hip fracture is 9.0 % within the next ten years. Electronically Signed   By: Margarette [place] M.D.   On: 07/13/2019 13:09    Impression: Recurrent non-small cell lung cancer  Unfortunately the patient has persistent disease in the left upper chest after his retreatment.  He also has a new area in the right perihilar area concerning for metastatic disease.  I discussed these findings with patient and his wife.  I recommended he be seen by medical oncology for consultation and consideration for additional therapy.  Plan: Routine follow-up in radiation oncology in 3 months.  medical oncology consultation with Dr. Julien Nordmann as above. ____________________________________   Blair Promise, PhD, MD  This document serves as a record of services personally performed by Gery Pray, MD. It was created on his behalf by Clerance Lav, a trained medical scribe. The creation of this record is based on the scribe's personal observations and the provider's statements to them. This document has been checked and approved by the attending provider.

## 2019-08-06 ENCOUNTER — Encounter: Payer: Self-pay | Admitting: Radiation Oncology

## 2019-08-06 ENCOUNTER — Ambulatory Visit
Admission: RE | Admit: 2019-08-06 | Discharge: 2019-08-06 | Disposition: A | Discharge status: Home / Self Care | Payer: Medicare Other | Source: Ambulatory Visit | Attending: Radiation Oncology | Admitting: Radiation Oncology

## 2019-08-06 ENCOUNTER — Other Ambulatory Visit: Payer: Self-pay

## 2019-08-06 VITALS — BP 116/73 | HR 70 | Temp 97.8°F | Resp 20 | Ht 71.0 in | Wt 195.6 lb

## 2019-08-06 DIAGNOSIS — I714 Abdominal aortic aneurysm, without rupture: Secondary | ICD-10-CM | POA: Diagnosis not present

## 2019-08-06 DIAGNOSIS — R918 Other nonspecific abnormal finding of lung field: Secondary | ICD-10-CM | POA: Diagnosis not present

## 2019-08-06 DIAGNOSIS — Z7982 Long term (current) use of aspirin: Secondary | ICD-10-CM | POA: Insufficient documentation

## 2019-08-06 DIAGNOSIS — Z923 Personal history of irradiation: Secondary | ICD-10-CM | POA: Insufficient documentation

## 2019-08-06 DIAGNOSIS — Z79899 Other long term (current) drug therapy: Secondary | ICD-10-CM | POA: Insufficient documentation

## 2019-08-06 DIAGNOSIS — C3412 Malignant neoplasm of upper lobe, left bronchus or lung: Secondary | ICD-10-CM | POA: Diagnosis present

## 2019-08-06 DIAGNOSIS — C3492 Malignant neoplasm of unspecified part of left bronchus or lung: Secondary | ICD-10-CM

## 2019-08-06 NOTE — Progress Notes (Signed)
Patient here for a f/u visit with Dr. Sondra Come  Denies pain and fatigue.  Reports probs swallowing water and other liquids.   Coughs daily but amount varies.  No dyspnea.  Pt. Taking Carafate 2-3 x a day.  Uses O2 at night and prn.   BP 116/73 (BP Location: Left Arm, Patient Position: Sitting, Cuff Size: Normal)   Pulse 70   Temp 97.8 F (36.6 C)   Resp 20   Ht 5\' 11"  (1.803 m)   Wt 195 lb 9.6 oz (88.7 kg)   SpO2 96%   BMI 27.28 kg/m     Wt Readings from Last 3 Encounters:  08/06/19 195 lb 9.6 oz (88.7 kg)  06/07/19 199 lb 9.6 oz (90.5 kg)  05/08/19 199 lb 2 oz (90.3 kg)

## 2019-08-07 ENCOUNTER — Telehealth: Payer: Self-pay | Admitting: *Deleted

## 2019-08-07 DIAGNOSIS — C3492 Malignant neoplasm of unspecified part of left bronchus or lung: Secondary | ICD-10-CM

## 2019-08-07 NOTE — Telephone Encounter (Signed)
Patient called back and asked that I speak to his wife about the appt.  He is not able to speak well.  I spoke to him and his wife.  I updated him on his appt to be seen with Dr. Julien Nordmann next week.  She verbalized understanding of appt time and place.

## 2019-08-07 NOTE — Telephone Encounter (Signed)
I received referral on [name].  I called to schedule an appt but was not able to reach him.  I did leave vm message with my name and phone number to call.

## 2019-08-16 ENCOUNTER — Other Ambulatory Visit: Payer: Self-pay

## 2019-08-16 ENCOUNTER — Inpatient Hospital Stay: Payer: Medicare Other | Attending: Internal Medicine | Admitting: Internal Medicine

## 2019-08-16 ENCOUNTER — Inpatient Hospital Stay: Payer: Medicare Other

## 2019-08-16 ENCOUNTER — Encounter: Payer: Self-pay | Admitting: Internal Medicine

## 2019-08-16 ENCOUNTER — Other Ambulatory Visit: Payer: Self-pay | Admitting: *Deleted

## 2019-08-16 VITALS — BP 113/69 | HR 66 | Temp 97.7°F | Resp 17 | Ht 71.0 in | Wt 190.7 lb

## 2019-08-16 DIAGNOSIS — I252 Old myocardial infarction: Secondary | ICD-10-CM | POA: Insufficient documentation

## 2019-08-16 DIAGNOSIS — M8580 Other specified disorders of bone density and structure, unspecified site: Secondary | ICD-10-CM | POA: Insufficient documentation

## 2019-08-16 DIAGNOSIS — Z79899 Other long term (current) drug therapy: Secondary | ICD-10-CM | POA: Insufficient documentation

## 2019-08-16 DIAGNOSIS — C3490 Malignant neoplasm of unspecified part of unspecified bronchus or lung: Secondary | ICD-10-CM

## 2019-08-16 DIAGNOSIS — I2510 Atherosclerotic heart disease of native coronary artery without angina pectoris: Secondary | ICD-10-CM | POA: Insufficient documentation

## 2019-08-16 DIAGNOSIS — I10 Essential (primary) hypertension: Secondary | ICD-10-CM | POA: Diagnosis not present

## 2019-08-16 DIAGNOSIS — C3412 Malignant neoplasm of upper lobe, left bronchus or lung: Secondary | ICD-10-CM | POA: Diagnosis present

## 2019-08-16 DIAGNOSIS — C3492 Malignant neoplasm of unspecified part of left bronchus or lung: Secondary | ICD-10-CM | POA: Diagnosis not present

## 2019-08-16 DIAGNOSIS — E041 Nontoxic single thyroid nodule: Secondary | ICD-10-CM | POA: Diagnosis not present

## 2019-08-16 DIAGNOSIS — Z87891 Personal history of nicotine dependence: Secondary | ICD-10-CM | POA: Diagnosis not present

## 2019-08-16 LAB — CBC WITH DIFFERENTIAL (CANCER CENTER ONLY)
Abs Immature Granulocytes: 0.04 10*3/uL (ref 0.00–0.07)
Basophils Absolute: 0.1 10*3/uL (ref 0.0–0.1)
Basophils Relative: 0 %
Eosinophils Absolute: 0.3 10*3/uL (ref 0.0–0.5)
Eosinophils Relative: 2 %
HCT: 40.2 % (ref 39.0–52.0)
Hemoglobin: 12.9 g/dL — ABNORMAL LOW (ref 13.0–17.0)
Immature Granulocytes: 0 %
Lymphocytes Relative: 54 %
Lymphs Abs: 7.2 10*3/uL — ABNORMAL HIGH (ref 0.7–4.0)
MCH: 29.5 pg (ref 26.0–34.0)
MCHC: 32.1 g/dL (ref 30.0–36.0)
MCV: 92 fL (ref 80.0–100.0)
Monocytes Absolute: 0.5 10*3/uL (ref 0.1–1.0)
Monocytes Relative: 4 %
Neutro Abs: 5.3 10*3/uL (ref 1.7–7.7)
Neutrophils Relative %: 40 %
Platelet Count: 184 10*3/uL (ref 150–400)
RBC: 4.37 MIL/uL (ref 4.22–5.81)
RDW: 13.2 % (ref 11.5–15.5)
WBC Count: 13.4 10*3/uL — ABNORMAL HIGH (ref 4.0–10.5)
nRBC: 0 % (ref 0.0–0.2)

## 2019-08-16 LAB — CMP (CANCER CENTER ONLY)
ALT: 12 U/L (ref 0–44)
AST: 14 U/L — ABNORMAL LOW (ref 15–41)
Albumin: 3.5 g/dL (ref 3.5–5.0)
Alkaline Phosphatase: 78 U/L (ref 38–126)
Anion gap: 9 (ref 5–15)
BUN: 17 mg/dL (ref 8–23)
CO2: 29 mmol/L (ref 22–32)
Calcium: 9.4 mg/dL (ref 8.9–10.3)
Chloride: 102 mmol/L (ref 98–111)
Creatinine: 0.95 mg/dL (ref 0.61–1.24)
GFR, Est AFR Am: >60 mL/min (ref >60)
GFR, Estimated: >60 mL/min (ref >60)
Glucose, Bld: 108 mg/dL — ABNORMAL HIGH (ref 70–99)
Potassium: 3.9 mmol/L (ref 3.5–5.1)
Sodium: 140 mmol/L (ref 135–145)
Total Bilirubin: 1.1 mg/dL (ref 0.3–1.2)
Total Protein: 7.3 g/dL (ref 6.5–8.1)

## 2019-08-16 NOTE — Progress Notes (Signed)
The proposed treatment discussed in cancer conference 08/16/19 is for discussion purpose only and is not a binding recommendation.  The patient was not physically examined nor present for their treatment options.  Therefore, final treatment plans cannot be decided.

## 2019-08-16 NOTE — Progress Notes (Signed)
[place] [phone]   [phone] Multidisciplinary thoracic oncology clinic  CONSULT NOTE  REFERRING PHYSICIAN: Dr. Gery Pray  REASON FOR CONSULTATION:  84 years old white male with recurrent lung cancer.  HPI [name] is a 84 y.o. male with past medical history significant for multiple medical problems including history of coronary artery disease, hypertension, dyslipidemia, COPD, GERD, hepatitis, CLL, cervical spine fracture, benign prostatic hypertrophy, abdominal aortic aneurysm, arthritis as well as chronic back pain and long history of smoking.  The patient was diagnosed February 2018 with stage Ia non-small cell lung cancer, adenocarcinoma.  He was not a good surgical candidate at that time and he underwent SBRT to the left upper lobe lung nodule under the care of Dr. Sondra Come.  Follow-up imaging studies with regular CT and PET scan showed evolution of postradiation changes and scarring in the left upper lobe.  A PET scan on January 10, 2019 showed 2.3 cm hypermetabolic left upper lobe nodule in the anterior left upper lobe concerning for residual/recurrent tumor with adjacent fiducial markers and radiation changes.  But there was no finding suspicious for metastatic disease.  He was treated again with hypofractionation radiotherapy for 6 weeks under the care of Dr. Sondra Come between February 13, 2019 through March 28, 2019.  Repeat PET scan on 08/02/2019 showed interval progression of the hypermetabolism associated with the nodular soft tissue in the anterior left lung apex with SUV max of 6.6.  There was also a 0.7 cm right upper lobe nodule that is new with hyper metabolism and SUV max of 6.0.  There was also new focal hypermetabolism identified in the right hilum with SUV max of 11.6.  The PET scan also showed 1.2 cm subpleural nodule in the paraspinal right lower lobe stable in size without hypermetabolism and 0.7 cm nodule in the posterior left  costophrenic sulcus that is new in the interval without hypermetabolism.  There is also asymmetric FDG uptake in the right vocal cord.  No evidence of metastatic disease outside the chest. Dr. Sondra Come kindly referred the patient to me today for evaluation and recommendation regarding his condition. When seen today he is feeling fine with no concerning complaints except for hoarseness of his voice secondary to vocal cord paralysis started after the radiotherapy.  He also has some questionable aspiration especially if he is leaning to the left side.  The patient denied having any chest pain, shortness of breath but has cough with no hemoptysis.  He lost around 20 pounds in the last year.  He has no nausea, vomiting, diarrhea or constipation.  He denied having any headache or visual changes. Family history significant for father died from lung cancer at age 77, mother had dementia, brother with jaw cancer and another brother with kidney cancer and lung cancer. The patient is married and has 1 daughter and 3 grandchildren.  He was accompanied by his wife Silva Bandy.  The patient used to work as a Engineer, structural.  He has a history of smoking up to 2 packs/day for around 43 years but quit in 2001.  He has no history of alcohol or drug abuse.  HPI  Past Medical History:  Diagnosis Date  . AAA (abdominal aortic aneurysm) (HCC)    measures 2.7 cm.  . Arthritis   . Back pain   . BPH (benign prostatic hypertrophy)   . Bruises easily   . CAD (coronary artery disease)   . Cervical spine fracture ([place]) feb  1990   C 6, C 7 and T 1, no surgery done wore halo brace  . CLL (chronic lymphocytic leukemia) ([place])    hx of worked up 2009 by dr Benay Spice, no tx done, released by dr Benay Spice  . Complication of anesthesia    cannot lie on right side gets vertigo and trouble turning neck to right  . COPD (chronic obstructive pulmonary disease) ([place]) may 2005  . Erectile dysfunction   . Fatigue   . GERD  (gastroesophageal reflux disease)   . Glaucoma   . Hepatitis as child   serum hepatitis  . History of oxygen administration    oxygen  _0   l/m nasally at bedtime.  Marland Kitchen History of radiation therapy 06/22/16-06/28/16   Left upper lobe of lung/ 54 Gy in 3 fractions  . Hyperlipidemia   . Hypertension   . Impaired hearing    wears Hearing aids  . Ischemic cardiomyopathy   . Leg pain   . Lumbar disc disease   . Lung cancer ([place]) dx'd 03/18/16  . Microscopic hematuria   . Myocardial infarct ([place]) 03-17-2009   Hx MI 2000  . Neuropathy    both feet and legs  . Osteopenia   . Pneumonia    hx x2  . Thyroid nodule     Past Surgical History:  Procedure Laterality Date  . APPENDECTOMY  sept, 10, 2005  . BACK SURGERY  2009   L 2 and L 3   . benign cyst removed from left neck  02-2010  . CERVICAL DISC SURGERY  2005-2008   C 2, C 3 and C 4 2005, rods placed C 6, C 7 and T 1  . COLONOSCOPY WITH PROPOFOL N/A 11/26/2014   Procedure: COLONOSCOPY WITH PROPOFOL;  Surgeon: Garlan Fair, MD;  Location: [facility];  Service: Endoscopy;  Laterality: N/A;  . CORONARY ANGIOPLASTY WITH STENT PLACEMENT  2013   1 stent repair, one stent replaced  . ESOPHAGOGASTRODUODENOSCOPY (EGD) WITH PROPOFOL N/A 11/27/2013   Procedure: ESOPHAGOGASTRODUODENOSCOPY (EGD) WITH PROPOFOL;  Surgeon: Garlan Fair, MD;  Location: [facility];  Service: Endoscopy;  Laterality: N/A;  . EYE SURGERY Bilateral july 2011   eye surgry for glaucoma  . FUDUCIAL PLACEMENT N/A 05/12/2016   Procedure: PLACEMENT OF FUDUCIAL;  Surgeon: Melrose Nakayama, MD;  Location: [place];  Service: Thoracic;  Laterality: N/A;  . fusion L 3 and L 4  August 15, 2008  . LEFT HEART CATHETERIZATION WITH CORONARY ANGIOGRAM N/A 08/12/2011   Procedure: LEFT HEART CATHETERIZATION WITH CORONARY ANGIOGRAM;  Surgeon: Larey Dresser, MD;  [facility] CATH LAB;  Service: Cardiovascular;  Laterality: N/A;  . NASAL SINUS SURGERY   oct 2005  . right knee  arthroscopy  1981  . stent top heart  2011   x 2  . TONSILLECTOMY  1943  . VIDEO BRONCHOSCOPY WITH ENDOBRONCHIAL NAVIGATION N/A 05/12/2016   Procedure: VIDEO BRONCHOSCOPY WITH ENDOBRONCHIAL NAVIGATION;  Surgeon: Melrose Nakayama, MD;  Location[facility] OR;  Service: Thoracic;  Laterality: N/A;    Family History  Problem Relation Age of Onset  . Lung cancer Father 15       died.   . Alzheimer's disease Mother        died of natural causes  . Cancer Brother        jaw  . Renal cancer Brother   . Lung cancer Brother     Social History Social History   Tobacco Use  . Smoking  status: Former Smoker    Packs/day: 2.00    Years: 55.00    Pack years: 110.00    Types: Cigarettes    Quit date: 07/14/2006    Years since quitting: 13.0  . Smokeless tobacco: Never Used  Substance Use Topics  . Alcohol use: No  . Drug use: No    Allergies  Allergen Reactions  . Entresto [Sacubitril-Valsartan] Other (See Comments)    Patient had diarrhea, hyptotensive,dizziness,headache and lethargic Patient had diarrhea, hyptotensive,dizziness,headache and lethargic  . Iodinated Diagnostic Agents Swelling and Other (See Comments)    Patient experienced tongue tingling, followed by swelling under tongue and in throat area, also patient had eye redness/irriation - Isovve Nonionic Contrast 75U  . Penicillins Swelling    SWELLING OF TONGUE  . Lumigan [Bimatoprost] Swelling    EYES SWELL  . Lyrica [Pregabalin] Other (See Comments)    TREMORS CONFUSION  . Sulfonamide Derivatives Swelling    SWELLING HANDS  . Symbicort [Budesonide-Formoterol Fumarate] Itching and Swelling    SWELLING REACTION UNSPECIFIED   . Valsartan Other (See Comments)    BP drop  . Advair Diskus [Fluticasone-Salmeterol] Itching  . Brimonidine Tartrate     UNSPECIFIED REACTION   . Ciprofloxacin     UNSPECIFIED REACTION Patient does not want to have this anymore(does not want any forms of this) patient reports having C Diff  after taking this  . Flovent Diskus [Fluticasone Propionate (Inhal)] Itching  . Pulmicort [Budesonide] Itching and Swelling    SWELLING REACTION UNSPECIFIED   . Qvar [Beclomethasone] Itching and Swelling    SWELLING REACTION UNSPECIFIED   . Spiriva [Tiotropium Bromide Monohydrate] Itching and Swelling    SWELLING REACTION UNSPECIFIED   . Tessalon [Benzonatate] Other (See Comments)    Dizzy/skin reaction  . Bacitracin Itching, Swelling and Anxiety    SWELLING REACTION UNSPECIFIED  Itching and swelling SWELLING REACTION UNSPECIFIED   . Flonase [Fluticasone Propionate] Other (See Comments)    NOSE BLEED  . Relafen [Nabumetone] Rash    REACTION: Reaction not known to relafen  . Tape Rash, Other (See Comments) and Itching    Skin tear, please use paper tape Skin tear, please use paper tape    Current Outpatient Medications  Medication Sig Dispense Refill  . albuterol (PROAIR HFA) 108 (90 BASE) MCG/ACT inhaler Inhale 2 puffs into the lungs every 6 (six) hours as needed for wheezing or shortness of breath.     Marland Kitchen albuterol (PROVENTIL) (2.5 MG/3ML) 0.083% nebulizer solution Take 2.5 mg by nebulization every 6 (six) hours as needed for wheezing or shortness of breath.    Marland Kitchen alendronate (FOSAMAX) 70 MG tablet Take 70 mg by mouth once a week. Take with a full glass of water on an empty stomach.    Marland Kitchen aspirin 81 MG tablet Take 81 mg by mouth daily.     Marland Kitchen atorvastatin (LIPITOR) 20 MG tablet daily.    Marland Kitchen CALCIUM-MAGNESIUM-VITAMIN D PO Take 1 tablet by mouth daily.    . dorzolamide-timolol (COSOPT) 22.3-6.8 MG/ML ophthalmic solution Place 1 drop into both eyes 2 (two) times daily.    Marland Kitchen gabapentin (NEURONTIN) 300 MG capsule Take 600 mg by mouth 2 (two) times daily.     Marland Kitchen latanoprost (XALATAN) 0.005 % ophthalmic solution Place 1 drop into both eyes at bedtime.     . methocarbamol (ROBAXIN) 750 MG tablet Take 750 mg by mouth as needed for muscle spasms.    . metoprolol succinate (TOPROL-XL) 25 MG 24 hr  tablet Take  25 mg by mouth every morning.     . nitroGLYCERIN (NITROSTAT) 0.4 MG SL tablet Place 0.4 mg under the tongue every 5 (five) minutes as needed for chest pain. For chest pain     . OXYGEN 2lpm with sleep only    . sucralfate (CARAFATE) 1 g tablet Take 1 tablet (1 g total) by mouth 4 (four) times daily -  with meals and at bedtime. 30 mins prior to meals, crush and dissolve in 10 mL of warm water prior to swallowing 120 tablet 1  . Tamsulosin HCl (FLOMAX) 0.4 MG CAPS Take 0.4 mg by mouth every evening.     . timolol (TIMOPTIC) 0.5 % ophthalmic solution      No current facility-administered medications for this visit.    Review of Systems  Constitutional: positive for fatigue and weight loss Eyes: negative Ears, nose, mouth, throat, and face: negative Respiratory: positive for cough Cardiovascular: negative Gastrointestinal: negative Genitourinary:negative Integument/breast: negative Hematologic/lymphatic: negative Musculoskeletal:negative Neurological: negative Behavioral/Psych: negative Endocrine: negative Allergic/Immunologic: negative  Physical Exam  BDZ:HGDJM, healthy, no distress, well nourished and well developed SKIN: skin color, texture, turgor are normal, no rashes or significant lesions HEAD: Normocephalic, No masses, lesions, tenderness or abnormalities EYES: normal, PERRLA, Conjunctiva are pink and non-injected EARS: External ears normal, Canals clear OROPHARYNX:no exudate, no erythema and lips, buccal mucosa, and tongue normal  NECK: supple, no adenopathy, no JVD LYMPH:  no palpable lymphadenopathy, no hepatosplenomegaly LUNGS: clear to auscultation , and palpation HEART: regular rate & rhythm, no murmurs and no gallops ABDOMEN:abdomen soft, non-tender, normal bowel sounds and no masses or organomegaly BACK: No CVA tenderness, Range of motion is normal EXTREMITIES:no joint deformities, effusion, or inflammation, no edema  NEURO: alert & oriented x 3  with fluent speech, no focal motor/sensory deficits  PERFORMANCE STATUS: ECOG 1  LABORATORY DATA: Lab Results  Component Value Date   WBC 11.6 (H) 09/26/2018   HGB 14.7 09/26/2018   HCT 44.0 09/26/2018   MCV 93 09/26/2018   PLT 151 09/26/2018      Chemistry      Component Value Date/Time   NA 143 09/26/2018 1013   K 4.7 09/26/2018 1013   CL 103 09/26/2018 1013   CO2 23 09/26/2018 1013   BUN 17 09/26/2018 1013   BUN 11.8 06/22/2016 1207   CREATININE 1.07 09/26/2018 1013   CREATININE 1.0 06/22/2016 1207      Component Value Date/Time   CALCIUM 9.5 09/26/2018 1013   ALKPHOS 65 09/26/2018 1013   AST 24 09/26/2018 1013   ALT 19 09/26/2018 1013   BILITOT 0.9 09/26/2018 1013       RADIOGRAPHIC STUDIES: NM PET Image Restag (PS) Skull Base To Thigh  Result Date: 08/02/2019 CLINICAL DATA:  Subsequent treatment strategy for non-small-cell lung cancer. EXAM: NUCLEAR MEDICINE PET SKULL BASE TO THIGH TECHNIQUE: 9.7 mCi F-18 FDG was injected intravenously. Full-ring PET imaging was performed from the skull base to thigh after the radiotracer. CT data was obtained and used for attenuation correction and anatomic localization. Fasting blood glucose: 137 mg/dl COMPARISON:  01/10/2019. FINDINGS: Mediastinal blood pool activity: SUV max 3.3 Liver activity: SUV max N/A NECK: Hypermetabolism noted right vocal cord. There is some medial deviation of the posterior left focal cord. This appearance could be related to phonation after FDG injection assuming paralysis of the left cord. This is new in the interval since prior study. Direct visualization may be warranted to exclude mucosal lesion. Stable appearance of the previously characterized 13  mm right thyroid nodule with stable hypermetabolism ( SUV max = 8.8 today compared to 8.6 previously. Incidental CT findings: Tiny right middle lobe pulmonary nodule seen on the previous study is no longer evident. CHEST: Interval progression of the  hypermetabolism associated with the nodular soft tissue in the anterior left lung apex. SUV max = 6.6 today compared to 4.0 previously. Qualitatively, the abnormal soft tissue in this region on CT imaging is similar between the 2 exams. 7 mm right upper lobe nodule (image 26/8) is new in the interval with demonstrable hypermetabolism ( SUV max = 6.0). New focal hypermetabolism is identified in the right hilum with SUV max = 11.6. Although assessment hindered by lack of intravenous contrast on CT data for attenuation correction today, activity likely corresponds to a 9 mm short axis hilar node visible on 73/4. 12 mm subpleural nodule in the paraspinal right lower lobe (62/8) is stable in size and without hypermetabolism. 7 mm nodule posterior left costophrenic sulcus (56/8) is new in the interval and without hypermetabolism. This could be atelectatic. Incidental CT findings: Coronary artery calcification is evident. Atherosclerotic calcification is noted in the wall of the thoracic aorta. Enlargement of the pulmonary outflow tract raises the question of pulmonary arterial hypertension. ABDOMEN/PELVIS: No abnormal hypermetabolic activity within the liver, pancreas, adrenal glands, or spleen. No hypermetabolic lymph nodes in the abdomen or pelvis. Incidental CT findings: Similar appearance bilateral renal cysts atherosclerotic disease noted in the abdominal aorta which measures up to 3.8 cm maximum diameter, not substantially changed from 3.7 cm when I remeasure in a similar fashion on the prior study. Small left groin hernia contains only fat. SKELETON: No focal hypermetabolic activity to suggest skeletal metastasis. Incidental CT findings: Sequelae of prior cervical and lumbar fusion. IMPRESSION: 1. Interval progression of hypermetabolism associated with the anteromedial soft tissue in the left lung apex, concerning for progressive disease. 2. New hypermetabolic parahilar right upper lobe nodule associated with new  focal hypermetabolism in the right hilum, features concerning for metastatic involvement. 3. New 7 mm nodule posterior left costophrenic sulcus without hypermetabolism. This may be related to atelectasis. Attention on follow-up recommended. 4. Asymmetric FDG uptake in the right vocal cord. Focal cord activity can be seen if a patient is talking after radiopharmaceutical injection although unilateral uptake would not be expected unless there is paralysis of one of the vocal cords (left-side in this particular case). In the absence of vocal cord paralysis, direct visualization may be warranted to exclude mucosal lesion on the right. 5. Abdominal aortic aneurysm.  Attention on follow-up recommended. Electronically Signed   By: Misty Stanley M.D.   On: 08/02/2019 13:40    ASSESSMENT: This is a very pleasant 84 years old white male with highly suspicious recurrent non-small cell lung cancer, adenocarcinoma that was initially diagnosed in February 2018 with a stage Ia presented with left upper lobe pulmonary nodule.  The patient is status post SBRT to that lesion in 2018 and has been on observation for more than 2 years.  He develop evidence for local recurrence in the left upper lobe and treated with conventional radiotherapy under the care of Dr. Dorathy Daft completed on March 28, 2019. The most recent imaging studies including a PET scan in May 2021 showed evidence for disease recurrence in the treated area as well as right upper lobe hypermetabolic nodule and right hilar adenopathy.   PLAN: I had a lengthy discussion with the patient and his wife today about his current disease stage, prognosis and  treatment options. I personally and independently reviewed the scan images and discussed the result and showed the images to the patient and his wife today. I recommended for the patient to see Dr. Roxan Hockey again for evaluation and consideration of repeat bronchoscopy with endobronchial ultrasound. I will also  complete the staging work-up by ordering MRI of the brain to rule out brain metastasis. I will see the patient back for follow-up visit in 3 weeks for evaluation and discussion of his treatment options based on the final pathology and staging work-up. If the final pathology is consistent with recurrent adenocarcinoma, we will send the tissue block for molecular studies and PD-L1 expression. The patient was advised to call immediately if he has any concerning symptoms in the interval. The patient voices understanding of current disease status and treatment options and is in agreement with the current care plan.  All questions were answered. The patient knows to call the clinic with any problems, questions or concerns. We can certainly see the patient much sooner if necessary.  Thank you so much for allowing me to participate in the care of [name]. I will continue to follow up the patient with you and assist in his care.  The total time spent in the appointment was 60 minutes.  Disclaimer: This note was dictated with voice recognition software. Similar sounding words can inadvertently be transcribed and may not be corrected upon review.   Eilleen Kempf August 16, 2019, 1:57 PM

## 2019-08-17 ENCOUNTER — Encounter: Payer: Self-pay | Admitting: *Deleted

## 2019-08-17 ENCOUNTER — Telehealth: Payer: Self-pay | Admitting: Internal Medicine

## 2019-08-17 DIAGNOSIS — C3492 Malignant neoplasm of unspecified part of left bronchus or lung: Secondary | ICD-10-CM

## 2019-08-17 NOTE — Progress Notes (Signed)
Per Dr. Julien Nordmann, referral to [facility], Dr. Roxan Hockey completed.

## 2019-08-17 NOTE — Progress Notes (Signed)
Oncology Nurse Navigator Documentation  Oncology Nurse Navigator Flowsheets 08/17/2019  Abnormal Finding Date 08/02/2019  Diagnosis Status Additional Work Up  Navigator Follow Up Date: 08/20/2019  Navigator Follow Up Reason: Appointment Review  Navigator Location [facility]  Referral Date to RadOnc/MedOnc -  Navigator Encounter Type Clinic/MDC/I met Mr. Zylstra and his wife yesterday at thoracic clinic.  Unfortunately, it looks like Mr. Bee has disease progression.  I help to explain treatment plan and next steps.    Telephone -  [facility] Date 08/16/2019  Multidisiplinary Clinic Type Thoracic  Patient Visit Type Initial;MedOnc  Treatment Phase Abnormal Scans  Barriers/Navigation Needs Education  Education Other  Interventions Education;Psycho-Social Support  Acuity Level 2-Minimal Needs (1-2 Barriers Identified)  Coordination of Care -  Education Method Verbal  Time Spent with Patient 30

## 2019-08-17 NOTE — Telephone Encounter (Signed)
Scheduled per los. Called and spoke with patients wife. Confirmed appt

## 2019-08-23 ENCOUNTER — Other Ambulatory Visit: Payer: Self-pay

## 2019-08-23 ENCOUNTER — Encounter: Payer: Medicare Other | Admitting: Thoracic Surgery (Cardiothoracic Vascular Surgery)

## 2019-08-23 ENCOUNTER — Encounter: Payer: Self-pay | Admitting: Thoracic Surgery (Cardiothoracic Vascular Surgery)

## 2019-08-23 ENCOUNTER — Institutional Professional Consult (permissible substitution): Payer: Medicare Other | Admitting: Thoracic Surgery (Cardiothoracic Vascular Surgery)

## 2019-08-23 DIAGNOSIS — R918 Other nonspecific abnormal finding of lung field: Secondary | ICD-10-CM | POA: Diagnosis not present

## 2019-08-23 NOTE — Progress Notes (Signed)
PCP is Wenda Low, MD Referring Provider is Curt Bears, MD  Chief Complaint  Patient presents with  . Lung Mass    new patient consultation, PET 5/21, Chest CT 12/25/18    HPI: [name] is sent for consultation for possible bronchoscopic biopsy.  [name] is an 84 year old man with a past medical history significant for CAD, ischemic cardiomyopathy, tobacco abuse, COPD, stage Ia non-small cell carcinoma of the left upper lobe treated with stereotactic radiation 2018, glaucoma, hepatitis, reflux, arthritis, and neuropathy.    I did navigational bronchoscopy and fiducial placement in 2018 for a left upper lobe nodule.  Biopsy showed adenocarcinoma.  He was not a surgical candidate and had stereotactic radiation.  He developed vocal cord paralysis and hoarseness from that but otherwise tolerated the procedure well.  A CT in October showed increased soft tissue density in the previously treated area.  On PET CT there was some metabolic activity in that region.  He then had a 23-month follow-up PET in May.  It showed progression of that lesion as well as a new 7 mm right upper lobe nodule that was hypermetabolic.  There also was focus of hypermetabolism in the right hilum, but no definite node on the CT images.  [name] has some shortness of breath with exertion.  That has been stable over time.  He does use home oxygen.  He has had a productive cough, but denies hemoptysis.  He has had a loss of appetite and has lost about 8 pounds over the past 3 months.  He has also been trying to lose some weight during that timeframe.  He has difficulty swallowing water but no difficulty with food.  Zubrod Score: At the time of surgery this patient's most appropriate activity status/level should be described as: []     0    Normal activity, no symptoms [x]     1    Restricted in physical strenuous activity but ambulatory, able to do out light work []     2    Ambulatory and capable of self care,  unable to do work activities, up and about >50 % of waking hours                              []     3    Only limited self care, in bed greater than 50% of waking hours []     4    Completely disabled, no self care, confined to bed or chair []     5    Moribund  Past Medical History:  Diagnosis Date  . AAA (abdominal aortic aneurysm) (HCC)    measures 2.7 cm.  . Arthritis   . Back pain   . BPH (benign prostatic hypertrophy)   . Bruises easily   . CAD (coronary artery disease)   . Cervical spine fracture ([place]) feb 1990   C 6, C 7 and T 1, no surgery done wore halo brace  . CLL (chronic lymphocytic leukemia) ([place])    hx of worked up 2009 by dr Benay Spice, no tx done, released by dr Benay Spice  . Complication of anesthesia    cannot lie on right side gets vertigo and trouble turning neck to right  . COPD (chronic obstructive pulmonary disease) ([place]) may 2005  . Erectile dysfunction   . Fatigue   . GERD (gastroesophageal reflux disease)   . Glaucoma   . Hepatitis as child   serum  hepatitis  . History of oxygen administration    oxygen  @2   l/m nasally at bedtime.  Marland Kitchen History of radiation therapy 06/22/16-06/28/16   Left upper lobe of lung/ 54 Gy in 3 fractions  . Hyperlipidemia   . Hypertension   . Impaired hearing    wears Hearing aids  . Ischemic cardiomyopathy   . Leg pain   . Lumbar disc disease   . Lung cancer ([place]) dx'd 03/18/16  . Microscopic hematuria   . Myocardial infarct ([place]) 03-17-2009   Hx MI 2000  . Neuropathy    both feet and legs  . Osteopenia   . Pneumonia    hx x2  . Thyroid nodule     Past Surgical History:  Procedure Laterality Date  . APPENDECTOMY  sept, 10, 2005  . BACK SURGERY  2009   L 2 and L 3   . benign cyst removed from left neck  02-2010  . CERVICAL DISC SURGERY  2005-2008   C 2, C 3 and C 4 2005, rods placed C 6, C 7 and T 1  . COLONOSCOPY WITH PROPOFOL N/A 11/26/2014   Procedure: COLONOSCOPY WITH PROPOFOL;  Surgeon: Garlan Fair, MD;   Location: [facility];  Service: Endoscopy;  Laterality: N/A;  . CORONARY ANGIOPLASTY WITH STENT PLACEMENT  2013   1 stent repair, one stent replaced  . ESOPHAGOGASTRODUODENOSCOPY (EGD) WITH PROPOFOL N/A 11/27/2013   Procedure: ESOPHAGOGASTRODUODENOSCOPY (EGD) WITH PROPOFOL;  Surgeon: Garlan Fair, MD;  Location: [facility];  Service: Endoscopy;  Laterality: N/A;  . EYE SURGERY Bilateral july 2011   eye surgry for glaucoma  . FUDUCIAL PLACEMENT N/A 05/12/2016   Procedure: PLACEMENT OF FUDUCIAL;  Surgeon: Melrose Nakayama, MD;  Location: [place];  Service: Thoracic;  Laterality: N/A;  . fusion L 3 and L 4  August 15, 2008  . LEFT HEART CATHETERIZATION WITH CORONARY ANGIOGRAM N/A 08/12/2011   Procedure: LEFT HEART CATHETERIZATION WITH CORONARY ANGIOGRAM;  Surgeon: Larey Dresser, MD;  Location: [facility] CATH LAB;  Service: Cardiovascular;  Laterality: N/A;  . NASAL SINUS SURGERY   oct 2005  . right knee arthroscopy  1981  . stent top heart  2011   x 2  . TONSILLECTOMY  1943  . VIDEO BRONCHOSCOPY WITH ENDOBRONCHIAL NAVIGATION N/A 05/12/2016   Procedure: VIDEO BRONCHOSCOPY WITH ENDOBRONCHIAL NAVIGATION;  Surgeon: Melrose Nakayama, MD;  Location: [facility] OR;  Service: Thoracic;  Laterality: N/A;    Family History  Problem Relation Age of Onset  . Lung cancer Father 51       died.   . Alzheimer's disease Mother        died of natural causes  . Cancer Brother        jaw  . Renal cancer Brother   . Lung cancer Brother     Social History Social History   Tobacco Use  . Smoking status: Former Smoker    Packs/day: 2.00    Years: 55.00    Pack years: 110.00    Types: Cigarettes    Quit date: 07/14/2006    Years since quitting: 13.1  . Smokeless tobacco: Never Used  Vaping Use  . Vaping Use: Never used  Substance Use Topics  . Alcohol use: No  . Drug use: No    Current Outpatient Medications  Medication Sig Dispense Refill  . acetaminophen (TYLENOL) 500 MG tablet Take 500 mg by  mouth every 6 (six) hours as needed.    Marland Kitchen albuterol (  PROAIR HFA) 108 (90 BASE) MCG/ACT inhaler Inhale 2 puffs into the lungs every 6 (six) hours as needed for wheezing or shortness of breath.     Marland Kitchen aspirin 81 MG tablet Take 81 mg by mouth daily.     Marland Kitchen atorvastatin (LIPITOR) 20 MG tablet daily.    Marland Kitchen CALCIUM-MAGNESIUM-VITAMIN D PO Take 1 tablet by mouth daily.    Marland Kitchen gabapentin (NEURONTIN) 300 MG capsule Take 600 mg by mouth 2 (two) times daily.     Marland Kitchen latanoprost (XALATAN) 0.005 % ophthalmic solution Place 1 drop into both eyes at bedtime.     . metoprolol succinate (TOPROL-XL) 25 MG 24 hr tablet Take 25 mg by mouth every morning.     . Nebulizers MISC by Does not apply route.    . nitroGLYCERIN (NITROSTAT) 0.4 MG SL tablet Place 0.4 mg under the tongue every 5 (five) minutes as needed for chest pain. For chest pain     . OXYGEN 2lpm with sleep only    . sucralfate (CARAFATE) 1 g tablet Take 1 tablet (1 g total) by mouth 4 (four) times daily -  with meals and at bedtime. 30 mins prior to meals, crush and dissolve in 10 mL of warm water prior to swallowing 120 tablet 1  . Tamsulosin HCl (FLOMAX) 0.4 MG CAPS Take 0.4 mg by mouth every evening.     . timolol (TIMOPTIC) 0.5 % ophthalmic solution      No current facility-administered medications for this visit.    Allergies  Allergen Reactions  . Entresto [Sacubitril-Valsartan] Other (See Comments)    Patient had diarrhea, hyptotensive,dizziness,headache and lethargic Patient had diarrhea, hyptotensive,dizziness,headache and lethargic  . Iodinated Diagnostic Agents Swelling and Other (See Comments)    Patient experienced tongue tingling, followed by swelling under tongue and in throat area, also patient had eye redness/irriation - Isovve Nonionic Contrast 75U  . Penicillins Swelling    SWELLING OF TONGUE  . Lumigan [Bimatoprost] Swelling    EYES SWELL  . Lyrica [Pregabalin] Other (See Comments)    TREMORS CONFUSION  . Sulfonamide Derivatives  Swelling    SWELLING HANDS  . Symbicort [Budesonide-Formoterol Fumarate] Itching and Swelling    SWELLING REACTION UNSPECIFIED   . Valsartan Other (See Comments)    BP drop  . Advair Diskus [Fluticasone-Salmeterol] Itching  . Brimonidine Tartrate     UNSPECIFIED REACTION   . Ciprofloxacin     UNSPECIFIED REACTION Patient does not want to have this anymore(does not want any forms of this) patient reports having C Diff after taking this  . Flovent Diskus [Fluticasone Propionate (Inhal)] Itching  . Pulmicort [Budesonide] Itching and Swelling    SWELLING REACTION UNSPECIFIED   . Qvar [Beclomethasone] Itching and Swelling    SWELLING REACTION UNSPECIFIED   . Spiriva [Tiotropium Bromide Monohydrate] Itching and Swelling    SWELLING REACTION UNSPECIFIED   . Tessalon [Benzonatate] Other (See Comments)    Dizzy/skin reaction  . Bacitracin Itching, Swelling and Anxiety    SWELLING REACTION UNSPECIFIED  Itching and swelling SWELLING REACTION UNSPECIFIED   . Flonase [Fluticasone Propionate] Other (See Comments)    NOSE BLEED  . Relafen [Nabumetone] Rash    REACTION: Reaction not known to relafen  . Tape Rash, Other (See Comments) and Itching    Skin tear, please use paper tape Skin tear, please use paper tape    Review of Systems  Constitutional: Positive for appetite change and unexpected weight change. Negative for activity change.  HENT: Positive for hearing  loss, trouble swallowing and voice change.   Respiratory: Positive for cough and shortness of breath. Negative for wheezing.   Cardiovascular: Negative for chest pain and leg swelling.  Genitourinary: Positive for frequency.  Neurological:       Pain secondary to peripheral neuropathy  Hematological: Bruises/bleeds easily.    BP 122/76 (BP Location: Right Arm, Patient Position: Sitting, Cuff Size: Normal)   Pulse 76   Temp 97.7 F (36.5 C)   Resp 18   Ht 5\' 11"  (1.803 m)   Wt 192 lb (87.1 kg)   SpO2 96% Comment: RA   BMI 26.78 kg/m  Physical Exam Vitals reviewed.  Constitutional:      General: He is not in acute distress.    Appearance: Normal appearance.  HENT:     Mouth/Throat:     Comments: Hoarse Eyes:     General: No scleral icterus.    Extraocular Movements: Extraocular movements intact.  Cardiovascular:     Rate and Rhythm: Normal rate and regular rhythm.     Heart sounds: Normal heart sounds. No murmur heard.   Pulmonary:     Effort: Pulmonary effort is normal. No respiratory distress.     Breath sounds: No wheezing or rales.     Comments: Diminished breath sounds bilaterally Abdominal:     General: There is no distension.     Palpations: Abdomen is soft.  Musculoskeletal:        General: No swelling.     Cervical back: Neck supple.  Lymphadenopathy:     Cervical: No cervical adenopathy.  Skin:    General: Skin is warm and dry.  Neurological:     General: No focal deficit present.     Mental Status: He is alert and oriented to person, place, and time.     Motor: No weakness.    Diagnostic Tests: NUCLEAR MEDICINE PET SKULL BASE TO THIGH  TECHNIQUE: 9.7 mCi F-18 FDG was injected intravenously. Full-ring PET imaging was performed from the skull base to thigh after the radiotracer. CT data was obtained and used for attenuation correction and anatomic localization.  Fasting blood glucose: 137 mg/dl  COMPARISON:  01/10/2019.  FINDINGS: Mediastinal blood pool activity: SUV max 3.3  Liver activity: SUV max N/A  NECK: Hypermetabolism noted right vocal cord. There is some medial deviation of the posterior left focal cord. This appearance could be related to phonation after FDG injection assuming paralysis of the left cord. This is new in the interval since prior study. Direct visualization may be warranted to exclude mucosal lesion.  Stable appearance of the previously characterized 13 mm right thyroid nodule with stable hypermetabolism ( SUV max = 8.8  today compared to 8.6 previously.  Incidental CT findings:  Tiny right middle lobe pulmonary nodule seen on the previous study is no longer evident.  CHEST: Interval progression of the hypermetabolism associated with the nodular soft tissue in the anterior left lung apex. SUV max = 6.6 today compared to 4.0 previously. Qualitatively, the abnormal soft tissue in this region on CT imaging is similar between the 2 exams.  7 mm right upper lobe nodule (image 26/8) is new in the interval with demonstrable hypermetabolism ( SUV max = 6.0).  New focal hypermetabolism is identified in the right hilum with SUV max = 11.6. Although assessment hindered by lack of intravenous contrast on CT data for attenuation correction today, activity likely corresponds to a 9 mm short axis hilar node visible on 73/4.  12 mm subpleural nodule  in the paraspinal right lower lobe (62/8) is stable in size and without hypermetabolism.  7 mm nodule posterior left costophrenic sulcus (56/8) is new in the interval and without hypermetabolism. This could be atelectatic.  Incidental CT findings: Coronary artery calcification is evident. Atherosclerotic calcification is noted in the wall of the thoracic aorta. Enlargement of the pulmonary outflow tract raises the question of pulmonary arterial hypertension.  ABDOMEN/PELVIS: No abnormal hypermetabolic activity within the liver, pancreas, adrenal glands, or spleen. No hypermetabolic lymph nodes in the abdomen or pelvis.  Incidental CT findings: Similar appearance bilateral renal cysts atherosclerotic disease noted in the abdominal aorta which measures up to 3.8 cm maximum diameter, not substantially changed from 3.7 cm when I remeasure in a similar fashion on the prior study. Small left groin hernia contains only fat.  SKELETON: No focal hypermetabolic activity to suggest skeletal metastasis.  Incidental CT findings: Sequelae of prior cervical and  lumbar fusion.  IMPRESSION: 1. Interval progression of hypermetabolism associated with the anteromedial soft tissue in the left lung apex, concerning for progressive disease. 2. New hypermetabolic parahilar right upper lobe nodule associated with new focal hypermetabolism in the right hilum, features concerning for metastatic involvement. 3. New 7 mm nodule posterior left costophrenic sulcus without hypermetabolism. This may be related to atelectasis. Attention on follow-up recommended. 4. Asymmetric FDG uptake in the right vocal cord. Focal cord activity can be seen if a patient is talking after radiopharmaceutical injection although unilateral uptake would not be expected unless there is paralysis of one of the vocal cords (left-side in this particular case). In the absence of vocal cord paralysis, direct visualization may be warranted to exclude mucosal lesion on the right. 5. Abdominal aortic aneurysm.  Attention on follow-up recommended.   Electronically Signed   By: Misty Stanley M.D.   On: 08/02/2019 13:40 I personally reviewed the PET/CT images and concur with the findings noted above.  Impression: [name] is an 84 year old man with a past medical history significant for CAD, ischemic cardiomyopathy, tobacco abuse, COPD, lung cancer, glaucoma, hepatitis, reflux, arthritis, and neuropathy.    He was diagnosed with a clinical stage Ia (T1, N0) adenocarcinoma of the left upper lobe in 2018.  He was treated with stereotactic radiation.  He did develop a left vocal paralysis, which would account for the PET CT findings noted above.  He now has an increase in soft tissue density in the previously treated area which also has hypermetabolic activity on PET.  In addition there is a new 7 mm right upper lobe lung nodule that is hypermetabolic along with some activity in the right hilum.  This could represent recurrence of his previous cancer along with a new cancer in the right  upper lobe (T1, N1) or metastatic disease (T1, N3, M1).  He needs a tissue diagnosis to guide therapy.  Hopefully we would be able to obtain enough tissue for molecular testing.  Navigational bronchoscopy would allow [place] to sample both upper lobe lesions and endobronchial ultrasound would allow [place] to obtain samples from the hilar lymph nodes on the right.  Given the lack of a CT correlate for the right hilar node and the very small size of the right upper lobe lung nodule, there is no guarantee will be able to make a definitive diagnosis.  I recommended to [name] that we do navigational bronchoscopy and endobronchial ultrasound for diagnosis and staging purposes.  I informed him of the general nature of the procedure.  He has had  the navigational bronchoscopy previously and is familiar with that.  We discussed the indications, risks, benefits, and alternatives.  He understands the risks include, but not limited to those associated with general anesthesia such as MI, stroke, DVT, PE as well as procedure specific risk such as bleeding, pneumothorax, and failure to make a diagnosis.  He understands there is a possibility of other unforeseeable complications as well.  He accepts the risks and wishes to proceed.  We will check with radiology and see if we can get a disc for the super D protocol from the CT images on the PET/CT.  Barring that he will need a new CT of the chest with super D protocol  Plan: Electromagnetic navigational bronchoscopy and endobronchial ultrasound on Thursday, 08/30/2019  Melrose Nakayama, MD [facility] [phone]

## 2019-08-23 NOTE — H&P (View-Only) (Signed)
PCP is Wenda Low, MD Referring Provider is Curt Bears, MD  Chief Complaint  Patient presents with  . Lung Mass    new patient consultation, PET 5/21, Chest CT 12/25/18    HPI: Mr. Beaumier is sent for consultation for possible bronchoscopic biopsy.  Rashed Edler is an 84 year old man with a past medical history significant for CAD, ischemic cardiomyopathy, tobacco abuse, COPD, stage Ia non-small cell carcinoma of the left upper lobe treated with stereotactic radiation 2018, glaucoma, hepatitis, reflux, arthritis, and neuropathy.    I did navigational bronchoscopy and fiducial placement in 2018 for a left upper lobe nodule.  Biopsy showed adenocarcinoma.  He was not a surgical candidate and had stereotactic radiation.  He developed vocal cord paralysis and hoarseness from that but otherwise tolerated the procedure well.  A CT in October showed increased soft tissue density in the previously treated area.  On PET CT there was some metabolic activity in that region.  He then had a 34-month follow-up PET in May.  It showed progression of that lesion as well as a new 7 mm right upper lobe nodule that was hypermetabolic.  There also was focus of hypermetabolism in the right hilum, but no definite node on the CT images.  Mr. Scarfo has some shortness of breath with exertion.  That has been stable over time.  He does use home oxygen.  He has had a productive cough, but denies hemoptysis.  He has had a loss of appetite and has lost about 8 pounds over the past 3 months.  He has also been trying to lose some weight during that timeframe.  He has difficulty swallowing water but no difficulty with food.  Zubrod Score: At the time of surgery this patient's most appropriate activity status/level should be described as: []     0    Normal activity, no symptoms [x]     1    Restricted in physical strenuous activity but ambulatory, able to do out light work []     2    Ambulatory and capable of self care,  unable to do work activities, up and about >50 % of waking hours                              []     3    Only limited self care, in bed greater than 50% of waking hours []     4    Completely disabled, no self care, confined to bed or chair []     5    Moribund  Past Medical History:  Diagnosis Date  . AAA (abdominal aortic aneurysm) (HCC)    measures 2.7 cm.  . Arthritis   . Back pain   . BPH (benign prostatic hypertrophy)   . Bruises easily   . CAD (coronary artery disease)   . Cervical spine fracture ([place]) feb 1990   C 6, C 7 and T 1, no surgery done wore halo brace  . CLL (chronic lymphocytic leukemia) ([place])    hx of worked up 2009 by dr Benay Spice, no tx done, released by dr Benay Spice  . Complication of anesthesia    cannot lie on right side gets vertigo and trouble turning neck to right  . COPD (chronic obstructive pulmonary disease) ([place]) may 2005  . Erectile dysfunction   . Fatigue   . GERD (gastroesophageal reflux disease)   . Glaucoma   . Hepatitis as child   serum  hepatitis  . History of oxygen administration    oxygen  @2   l/m nasally at bedtime.  Marland Kitchen History of radiation therapy 06/22/16-06/28/16   Left upper lobe of lung/ 54 Gy in 3 fractions  . Hyperlipidemia   . Hypertension   . Impaired hearing    wears Hearing aids  . Ischemic cardiomyopathy   . Leg pain   . Lumbar disc disease   . Lung cancer ([place]) dx'd 03/18/16  . Microscopic hematuria   . Myocardial infarct ([place]) 03-17-2009   Hx MI 2000  . Neuropathy    both feet and legs  . Osteopenia   . Pneumonia    hx x2  . Thyroid nodule     Past Surgical History:  Procedure Laterality Date  . APPENDECTOMY  sept, 10, 2005  . BACK SURGERY  2009   L 2 and L 3   . benign cyst removed from left neck  02-2010  . CERVICAL DISC SURGERY  2005-2008   C 2, C 3 and C 4 2005, rods placed C 6, C 7 and T 1  . COLONOSCOPY WITH PROPOFOL N/A 11/26/2014   Procedure: COLONOSCOPY WITH PROPOFOL;  Surgeon: Garlan Fair, MD;   Location: [facility];  Service: Endoscopy;  Laterality: N/A;  . CORONARY ANGIOPLASTY WITH STENT PLACEMENT  2013   1 stent repair, one stent replaced  . ESOPHAGOGASTRODUODENOSCOPY (EGD) WITH PROPOFOL N/A 11/27/2013   Procedure: ESOPHAGOGASTRODUODENOSCOPY (EGD) WITH PROPOFOL;  Surgeon: Garlan Fair, MD;  Location: [facility];  Service: Endoscopy;  Laterality: N/A;  . EYE SURGERY Bilateral july 2011   eye surgry for glaucoma  . FUDUCIAL PLACEMENT N/A 05/12/2016   Procedure: PLACEMENT OF FUDUCIAL;  Surgeon: Melrose Nakayama, MD;  Location: [place];  Service: Thoracic;  Laterality: N/A;  . fusion L 3 and L 4  August 15, 2008  . LEFT HEART CATHETERIZATION WITH CORONARY ANGIOGRAM N/A 08/12/2011   Procedure: LEFT HEART CATHETERIZATION WITH CORONARY ANGIOGRAM;  Surgeon: Larey Dresser, MD;  Location: [facility] CATH LAB;  Service: Cardiovascular;  Laterality: N/A;  . NASAL SINUS SURGERY   oct 2005  . right knee arthroscopy  1981  . stent top heart  2011   x 2  . TONSILLECTOMY  1943  . VIDEO BRONCHOSCOPY WITH ENDOBRONCHIAL NAVIGATION N/A 05/12/2016   Procedure: VIDEO BRONCHOSCOPY WITH ENDOBRONCHIAL NAVIGATION;  Surgeon: Melrose Nakayama, MD;  Location: [facility] OR;  Service: Thoracic;  Laterality: N/A;    Family History  Problem Relation Age of Onset  . Lung cancer Father 17       died.   . Alzheimer's disease Mother        died of natural causes  . Cancer Brother        jaw  . Renal cancer Brother   . Lung cancer Brother     Social History Social History   Tobacco Use  . Smoking status: Former Smoker    Packs/day: 2.00    Years: 55.00    Pack years: 110.00    Types: Cigarettes    Quit date: 07/14/2006    Years since quitting: 13.1  . Smokeless tobacco: Never Used  Vaping Use  . Vaping Use: Never used  Substance Use Topics  . Alcohol use: No  . Drug use: No    Current Outpatient Medications  Medication Sig Dispense Refill  . acetaminophen (TYLENOL) 500 MG tablet Take 500 mg by  mouth every 6 (six) hours as needed.    Marland Kitchen albuterol (  PROAIR HFA) 108 (90 BASE) MCG/ACT inhaler Inhale 2 puffs into the lungs every 6 (six) hours as needed for wheezing or shortness of breath.     Marland Kitchen aspirin 81 MG tablet Take 81 mg by mouth daily.     Marland Kitchen atorvastatin (LIPITOR) 20 MG tablet daily.    Marland Kitchen CALCIUM-MAGNESIUM-VITAMIN D PO Take 1 tablet by mouth daily.    Marland Kitchen gabapentin (NEURONTIN) 300 MG capsule Take 600 mg by mouth 2 (two) times daily.     Marland Kitchen latanoprost (XALATAN) 0.005 % ophthalmic solution Place 1 drop into both eyes at bedtime.     . metoprolol succinate (TOPROL-XL) 25 MG 24 hr tablet Take 25 mg by mouth every morning.     . Nebulizers MISC by Does not apply route.    . nitroGLYCERIN (NITROSTAT) 0.4 MG SL tablet Place 0.4 mg under the tongue every 5 (five) minutes as needed for chest pain. For chest pain     . OXYGEN 2lpm with sleep only    . sucralfate (CARAFATE) 1 g tablet Take 1 tablet (1 g total) by mouth 4 (four) times daily -  with meals and at bedtime. 30 mins prior to meals, crush and dissolve in 10 mL of warm water prior to swallowing 120 tablet 1  . Tamsulosin HCl (FLOMAX) 0.4 MG CAPS Take 0.4 mg by mouth every evening.     . timolol (TIMOPTIC) 0.5 % ophthalmic solution      No current facility-administered medications for this visit.    Allergies  Allergen Reactions  . Entresto [Sacubitril-Valsartan] Other (See Comments)    Patient had diarrhea, hyptotensive,dizziness,headache and lethargic Patient had diarrhea, hyptotensive,dizziness,headache and lethargic  . Iodinated Diagnostic Agents Swelling and Other (See Comments)    Patient experienced tongue tingling, followed by swelling under tongue and in throat area, also patient had eye redness/irriation - Isovve Nonionic Contrast 75U  . Penicillins Swelling    SWELLING OF TONGUE  . Lumigan [Bimatoprost] Swelling    EYES SWELL  . Lyrica [Pregabalin] Other (See Comments)    TREMORS CONFUSION  . Sulfonamide Derivatives  Swelling    SWELLING HANDS  . Symbicort [Budesonide-Formoterol Fumarate] Itching and Swelling    SWELLING REACTION UNSPECIFIED   . Valsartan Other (See Comments)    BP drop  . Advair Diskus [Fluticasone-Salmeterol] Itching  . Brimonidine Tartrate     UNSPECIFIED REACTION   . Ciprofloxacin     UNSPECIFIED REACTION Patient does not want to have this anymore(does not want any forms of this) patient reports having C Diff after taking this  . Flovent Diskus [Fluticasone Propionate (Inhal)] Itching  . Pulmicort [Budesonide] Itching and Swelling    SWELLING REACTION UNSPECIFIED   . Qvar [Beclomethasone] Itching and Swelling    SWELLING REACTION UNSPECIFIED   . Spiriva [Tiotropium Bromide Monohydrate] Itching and Swelling    SWELLING REACTION UNSPECIFIED   . Tessalon [Benzonatate] Other (See Comments)    Dizzy/skin reaction  . Bacitracin Itching, Swelling and Anxiety    SWELLING REACTION UNSPECIFIED  Itching and swelling SWELLING REACTION UNSPECIFIED   . Flonase [Fluticasone Propionate] Other (See Comments)    NOSE BLEED  . Relafen [Nabumetone] Rash    REACTION: Reaction not known to relafen  . Tape Rash, Other (See Comments) and Itching    Skin tear, please use paper tape Skin tear, please use paper tape    Review of Systems  Constitutional: Positive for appetite change and unexpected weight change. Negative for activity change.  HENT: Positive for hearing  loss, trouble swallowing and voice change.   Respiratory: Positive for cough and shortness of breath. Negative for wheezing.   Cardiovascular: Negative for chest pain and leg swelling.  Genitourinary: Positive for frequency.  Neurological:       Pain secondary to peripheral neuropathy  Hematological: Bruises/bleeds easily.    BP 122/76 (BP Location: Right Arm, Patient Position: Sitting, Cuff Size: Normal)   Pulse 76   Temp 97.7 F (36.5 C)   Resp 18   Ht 5\' 11"  (1.803 m)   Wt 192 lb (87.1 kg)   SpO2 96% Comment: RA   BMI 26.78 kg/m  Physical Exam Vitals reviewed.  Constitutional:      General: He is not in acute distress.    Appearance: Normal appearance.  HENT:     Mouth/Throat:     Comments: Hoarse Eyes:     General: No scleral icterus.    Extraocular Movements: Extraocular movements intact.  Cardiovascular:     Rate and Rhythm: Normal rate and regular rhythm.     Heart sounds: Normal heart sounds. No murmur heard.   Pulmonary:     Effort: Pulmonary effort is normal. No respiratory distress.     Breath sounds: No wheezing or rales.     Comments: Diminished breath sounds bilaterally Abdominal:     General: There is no distension.     Palpations: Abdomen is soft.  Musculoskeletal:        General: No swelling.     Cervical back: Neck supple.  Lymphadenopathy:     Cervical: No cervical adenopathy.  Skin:    General: Skin is warm and dry.  Neurological:     General: No focal deficit present.     Mental Status: He is alert and oriented to person, place, and time.     Motor: No weakness.    Diagnostic Tests: NUCLEAR MEDICINE PET SKULL BASE TO THIGH  TECHNIQUE: 9.7 mCi F-18 FDG was injected intravenously. Full-ring PET imaging was performed from the skull base to thigh after the radiotracer. CT data was obtained and used for attenuation correction and anatomic localization.  Fasting blood glucose: 137 mg/dl  COMPARISON:  01/10/2019.  FINDINGS: Mediastinal blood pool activity: SUV max 3.3  Liver activity: SUV max N/A  NECK: Hypermetabolism noted right vocal cord. There is some medial deviation of the posterior left focal cord. This appearance could be related to phonation after FDG injection assuming paralysis of the left cord. This is new in the interval since prior study. Direct visualization may be warranted to exclude mucosal lesion.  Stable appearance of the previously characterized 13 mm right thyroid nodule with stable hypermetabolism ( SUV max = 8.8  today compared to 8.6 previously.  Incidental CT findings:  Tiny right middle lobe pulmonary nodule seen on the previous study is no longer evident.  CHEST: Interval progression of the hypermetabolism associated with the nodular soft tissue in the anterior left lung apex. SUV max = 6.6 today compared to 4.0 previously. Qualitatively, the abnormal soft tissue in this region on CT imaging is similar between the 2 exams.  7 mm right upper lobe nodule (image 26/8) is new in the interval with demonstrable hypermetabolism ( SUV max = 6.0).  New focal hypermetabolism is identified in the right hilum with SUV max = 11.6. Although assessment hindered by lack of intravenous contrast on CT data for attenuation correction today, activity likely corresponds to a 9 mm short axis hilar node visible on 73/4.  12 mm subpleural nodule  in the paraspinal right lower lobe (62/8) is stable in size and without hypermetabolism.  7 mm nodule posterior left costophrenic sulcus (56/8) is new in the interval and without hypermetabolism. This could be atelectatic.  Incidental CT findings: Coronary artery calcification is evident. Atherosclerotic calcification is noted in the wall of the thoracic aorta. Enlargement of the pulmonary outflow tract raises the question of pulmonary arterial hypertension.  ABDOMEN/PELVIS: No abnormal hypermetabolic activity within the liver, pancreas, adrenal glands, or spleen. No hypermetabolic lymph nodes in the abdomen or pelvis.  Incidental CT findings: Similar appearance bilateral renal cysts atherosclerotic disease noted in the abdominal aorta which measures up to 3.8 cm maximum diameter, not substantially changed from 3.7 cm when I remeasure in a similar fashion on the prior study. Small left groin hernia contains only fat.  SKELETON: No focal hypermetabolic activity to suggest skeletal metastasis.  Incidental CT findings: Sequelae of prior cervical and  lumbar fusion.  IMPRESSION: 1. Interval progression of hypermetabolism associated with the anteromedial soft tissue in the left lung apex, concerning for progressive disease. 2. New hypermetabolic parahilar right upper lobe nodule associated with new focal hypermetabolism in the right hilum, features concerning for metastatic involvement. 3. New 7 mm nodule posterior left costophrenic sulcus without hypermetabolism. This may be related to atelectasis. Attention on follow-up recommended. 4. Asymmetric FDG uptake in the right vocal cord. Focal cord activity can be seen if a patient is talking after radiopharmaceutical injection although unilateral uptake would not be expected unless there is paralysis of one of the vocal cords (left-side in this particular case). In the absence of vocal cord paralysis, direct visualization may be warranted to exclude mucosal lesion on the right. 5. Abdominal aortic aneurysm.  Attention on follow-up recommended.   Electronically Signed   By: Misty Stanley M.D.   On: 08/02/2019 13:40 I personally reviewed the PET/CT images and concur with the findings noted above.  Impression: Mychal Decarlo is an 84 year old man with a past medical history significant for CAD, ischemic cardiomyopathy, tobacco abuse, COPD, lung cancer, glaucoma, hepatitis, reflux, arthritis, and neuropathy.    He was diagnosed with a clinical stage Ia (T1, N0) adenocarcinoma of the left upper lobe in 2018.  He was treated with stereotactic radiation.  He did develop a left vocal paralysis, which would account for the PET CT findings noted above.  He now has an increase in soft tissue density in the previously treated area which also has hypermetabolic activity on PET.  In addition there is a new 7 mm right upper lobe lung nodule that is hypermetabolic along with some activity in the right hilum.  This could represent recurrence of his previous cancer along with a new cancer in the right  upper lobe (T1, N1) or metastatic disease (T1, N3, M1).  He needs a tissue diagnosis to guide therapy.  Hopefully we would be able to obtain enough tissue for molecular testing.  Navigational bronchoscopy would allow [place] to sample both upper lobe lesions and endobronchial ultrasound would allow [place] to obtain samples from the hilar lymph nodes on the right.  Given the lack of a CT correlate for the right hilar node and the very small size of the right upper lobe lung nodule, there is no guarantee will be able to make a definitive diagnosis.  I recommended to Mr. Gates that we do navigational bronchoscopy and endobronchial ultrasound for diagnosis and staging purposes.  I informed him of the general nature of the procedure.  He has had  the navigational bronchoscopy previously and is familiar with that.  We discussed the indications, risks, benefits, and alternatives.  He understands the risks include, but not limited to those associated with general anesthesia such as MI, stroke, DVT, PE as well as procedure specific risk such as bleeding, pneumothorax, and failure to make a diagnosis.  He understands there is a possibility of other unforeseeable complications as well.  He accepts the risks and wishes to proceed.  We will check with radiology and see if we can get a disc for the super D protocol from the CT images on the PET/CT.  Barring that he will need a new CT of the chest with super D protocol  Plan: Electromagnetic navigational bronchoscopy and endobronchial ultrasound on Thursday, 08/30/2019  Melrose Nakayama, MD [facility] [phone]

## 2019-08-24 ENCOUNTER — Other Ambulatory Visit: Payer: Self-pay | Admitting: *Deleted

## 2019-08-24 DIAGNOSIS — R590 Localized enlarged lymph nodes: Secondary | ICD-10-CM

## 2019-08-24 DIAGNOSIS — R918 Other nonspecific abnormal finding of lung field: Secondary | ICD-10-CM

## 2019-08-27 ENCOUNTER — Other Ambulatory Visit: Payer: Self-pay | Admitting: *Deleted

## 2019-08-27 ENCOUNTER — Encounter (HOSPITAL_COMMUNITY)
Admission: RE | Admit: 2019-08-27 | Discharge: 2019-08-27 | Disposition: A | Discharge status: Home / Self Care | Payer: Medicare Other | Source: Ambulatory Visit | Attending: Thoracic Surgery (Cardiothoracic Vascular Surgery) | Admitting: Thoracic Surgery (Cardiothoracic Vascular Surgery)

## 2019-08-27 ENCOUNTER — Encounter (HOSPITAL_COMMUNITY): Payer: Self-pay

## 2019-08-27 ENCOUNTER — Ambulatory Visit (HOSPITAL_COMMUNITY)
Admission: RE | Admit: 2019-08-27 | Discharge: 2019-08-27 | Disposition: A | Discharge status: Home / Self Care | Payer: Medicare Other | Source: Ambulatory Visit | Attending: Thoracic Surgery (Cardiothoracic Vascular Surgery) | Admitting: Thoracic Surgery (Cardiothoracic Vascular Surgery)

## 2019-08-27 ENCOUNTER — Other Ambulatory Visit (HOSPITAL_COMMUNITY)
Admission: RE | Admit: 2019-08-27 | Discharge: 2019-08-27 | Disposition: A | Discharge status: Home / Self Care | Payer: Medicare Other | Source: Ambulatory Visit | Attending: Thoracic Surgery (Cardiothoracic Vascular Surgery) | Admitting: Thoracic Surgery (Cardiothoracic Vascular Surgery)

## 2019-08-27 ENCOUNTER — Other Ambulatory Visit: Payer: Self-pay

## 2019-08-27 DIAGNOSIS — R590 Localized enlarged lymph nodes: Secondary | ICD-10-CM | POA: Insufficient documentation

## 2019-08-27 DIAGNOSIS — Z20822 Contact with and (suspected) exposure to covid-19: Secondary | ICD-10-CM | POA: Diagnosis not present

## 2019-08-27 DIAGNOSIS — R918 Other nonspecific abnormal finding of lung field: Secondary | ICD-10-CM | POA: Insufficient documentation

## 2019-08-27 DIAGNOSIS — Z01818 Encounter for other preprocedural examination: Secondary | ICD-10-CM | POA: Insufficient documentation

## 2019-08-27 LAB — APTT: aPTT: 34 seconds (ref 24–36)

## 2019-08-27 LAB — SURGICAL PCR SCREEN
MRSA, PCR: NEGATIVE
Staphylococcus aureus: NEGATIVE

## 2019-08-27 LAB — SARS CORONAVIRUS 2 (TAT 6-24 HRS): SARS Coronavirus 2: NEGATIVE

## 2019-08-27 LAB — PROTIME-INR
INR: 1.2 (ref 0.8–1.2)
Prothrombin Time: 14.2 seconds (ref 11.4–15.2)

## 2019-08-27 NOTE — Progress Notes (Signed)
PCP Lysle Rubens, MD Cardiologist - Einar Gip, MD  PPM/ICD - n/a Device Orders -  Rep Notified -   Chest x-ray - 08/27/19 EKG - 08/27/19 Stress Test - 07/30/15 ECHO - 03/23/16 Cardiac Cath - 08/12/11   Sleep Study - yes CPAP - no, pt just wears home oxygen 2L [place]    Blood Thinner Instructions: n/a Aspirin Instructions: Follow your surgeon's instructions on when to stop Aspirin.  If no instructions were given by your surgeon then you will need to call the office to get those instructions.   Pecina Brooks [employer] to clarify, pt to continue ASA, do not take DOS.   ERAS Protcol - n/a PRE-SURGERY Ensure or G2- n/a  COVID TEST- 08/27/19  Coronavirus Screening  Have you experienced the following symptoms:  Cough yes/no: No Fever (>100.58F)  yes/no: No Runny nose yes/no: No Sore throat yes/no: No Difficulty breathing/shortness of breath  yes/no: No  Have you or a family member traveled in the last 14 days and where? yes/no: No   If the patient indicates "YES" to the above questions, their PAT will be rescheduled to limit the exposure to others and, the surgeon will be notified. THE PATIENT WILL NEED TO BE ASYMPTOMATIC FOR 14 DAYS.   If the patient is not experiencing any of these symptoms, the PAT nurse will instruct them to NOT bring anyone with them to their appointment since they may have these symptoms or traveled as well.   Please remind your patients and families that hospital visitation restrictions are in effect and the importance of the restrictions.    Anesthesia review: n/a  Patient denies shortness of breath, fever, cough and chest pain at PAT appointment   All instructions explained to the patient, with a verbal understanding of the material. Patient agrees to go over the instructions while at home for a better understanding. Patient also instructed to self quarantine after being tested for COVID-19. The opportunity to ask questions was provided.

## 2019-08-27 NOTE — Progress Notes (Addendum)
Your procedure is scheduled on Thursday June 17.  Report to [facility] Main Entrance "A" at 06:00 A.M., and check in at the Admitting office.  Call this number if you have problems the morning of surgery: [phone]  Call [phone] if you have any questions prior to your surgery date Monday-Friday 8am-4pm   Remember: Do not eat or drink after midnight the night before your surgery   Take these medicines the morning of surgery with A SIP OF WATER: atorvastatin (LIPITOR) gabapentin (NEURONTIN) metoprolol succinate (TOPROL-XL)  latanoprost (XALATAN) eye drops  IF NEEDED: albuterol (PROAIR HFA), nitroGLYCERIN (NITROSTAT)  Follow your surgeon's instructions on when to stop Aspirin.  Per your surgeon's orders, continue taking Aspirin, DO NOT take any Aspirin on the day of surgery  As of today, STOP taking any Aspirin containing products, Aleve, Naproxen, Ibuprofen, Motrin, Advil, Goody's, BC's, all herbal medications, fish oil, and all vitamins.    The Morning of Surgery  Do not wear jewelry  Do not wear lotions, powders, colognes, or deodorant   Men may shave face and neck.  Do not bring valuables to the hospital.  [facility] is not responsible for any belongings or valuables.  If you are a smoker, DO NOT Smoke 24 hours prior to surgery  If you wear a CPAP at night please bring your mask the morning of surgery   Remember that you must have someone to transport you home after your surgery, and remain with you for 24 hours if you are discharged the same day.   Please bring cases for contacts, glasses, hearing aids, dentures or bridgework because it cannot be worn into surgery.    Leave your suitcase in the car.  After surgery it may be brought to your room.  For patients admitted to the hospital, discharge time will be determined by your treatment team.  Patients discharged the day of surgery will not be allowed to drive home.    Special instructions:   [facility]- Preparing For Surgery  Before surgery, you can play an important role. Because skin is not sterile, your skin needs to be as free of germs as possible. You can reduce the number of germs on your skin by washing with CHG (chlorahexidine gluconate) Soap before surgery.  CHG is an antiseptic cleaner which kills germs and bonds with the skin to continue killing germs even after washing.    Oral Hygiene is also important to reduce your risk of infection.  Remember - BRUSH YOUR TEETH THE MORNING OF SURGERY WITH YOUR REGULAR TOOTHPASTE  Please do not use if you have an allergy to CHG or antibacterial soaps. If your skin becomes reddened/irritated stop using the CHG.  Do not shave (including legs and underarms) for at least 48 hours prior to first CHG shower. It is OK to shave your face.  Please follow these instructions carefully.   1. Shower the NIGHT BEFORE SURGERY and the MORNING OF SURGERY with CHG Soap.   2. If you chose to wash your hair and body, wash as usual with your normal shampoo and body-wash/soap.  3. Rinse your hair and body thoroughly to remove the shampoo and soap.  4. Apply CHG directly to the skin (ONLY FROM THE NECK DOWN) and wash gently with a scrungie or a clean washcloth.   5. Do not use on open wounds or open sores. Avoid contact with your eyes, ears, mouth and genitals (private parts). Wash Face and genitals (private parts)  with your  normal soap.   6. Wash thoroughly, paying special attention to the area where your surgery will be performed.  7. Thoroughly rinse your body with warm water from the neck down.  8. DO NOT shower/wash with your normal soap after using and rinsing off the CHG Soap.  9. Pat yourself dry with a CLEAN TOWEL.  10. Wear CLEAN PAJAMAS to bed the night before surgery  11. Place CLEAN SHEETS on your bed the night of your first shower and DO NOT SLEEP WITH PETS.  12. Wear comfortable clothes the morning of surgery.     Day of  Surgery:  Please shower the morning of surgery with the CHG soap Do not apply any deodorants/lotions. Please wear clean clothes to the hospital/surgery center.   Remember to brush your teeth WITH YOUR REGULAR TOOTHPASTE.   Please read over the following fact sheets that you were given.

## 2019-08-28 ENCOUNTER — Other Ambulatory Visit (HOSPITAL_COMMUNITY): Payer: Medicare Other

## 2019-08-28 ENCOUNTER — Ambulatory Visit (HOSPITAL_COMMUNITY)
Admission: RE | Admit: 2019-08-28 | Discharge: 2019-08-28 | Disposition: A | Discharge status: Home / Self Care | Payer: Medicare Other | Source: Ambulatory Visit | Attending: Internal Medicine | Admitting: Internal Medicine

## 2019-08-28 ENCOUNTER — Inpatient Hospital Stay (HOSPITAL_COMMUNITY): Admission: RE | Admit: 2019-08-28 | Payer: Medicare Other | Source: Ambulatory Visit

## 2019-08-28 DIAGNOSIS — C3490 Malignant neoplasm of unspecified part of unspecified bronchus or lung: Secondary | ICD-10-CM | POA: Diagnosis not present

## 2019-08-28 MED ORDER — GADOBUTROL 1 MMOL/ML IV SOLN
9.0000 mL | Freq: Once | INTRAVENOUS | Status: AC | PRN
  Administered 2019-08-28: 9 mL via INTRAVENOUS

## 2019-08-29 ENCOUNTER — Ambulatory Visit
Admission: RE | Admit: 2019-08-29 | Discharge: 2019-08-29 | Disposition: A | Discharge status: Home / Self Care | Payer: Medicare Other | Source: Ambulatory Visit | Attending: Thoracic Surgery (Cardiothoracic Vascular Surgery) | Admitting: Thoracic Surgery (Cardiothoracic Vascular Surgery)

## 2019-08-29 DIAGNOSIS — R918 Other nonspecific abnormal finding of lung field: Secondary | ICD-10-CM

## 2019-08-29 NOTE — Anesthesia Preprocedure Evaluation (Addendum)
Anesthesia Evaluation  Patient identified by MRN, date of birth, ID band Patient awake    Reviewed: Allergy & Precautions, NPO status , Patient's Chart, lab work & pertinent test results, reviewed documented beta blocker date and time   Airway Mallampati: II  TM Distance: >3 FB Neck ROM: Full    Dental  (+) Teeth Intact, Dental Advisory Given   Pulmonary sleep apnea , COPD,  oxygen dependent, former smoker,  Lung cancer s/p radiation BILATERAL LUNG NODULES  RIGHT HILAR ADENOPATHY   Pulmonary exam normal breath sounds clear to auscultation       Cardiovascular hypertension, Pt. on home beta blockers and Pt. on medications + CAD, + Past MI and + Peripheral Vascular Disease (AAA)  Normal cardiovascular exam Rhythm:Regular Rate:Normal     Neuro/Psych negative neurological ROS  negative psych ROS   GI/Hepatic Neg liver ROS, GERD  ,  Endo/Other  negative endocrine ROS  Renal/GU negative Renal ROS     Musculoskeletal  (+) Arthritis ,   Abdominal   Peds  Hematology  (+) Blood dyscrasia, anemia , H/o CLL   Anesthesia Other Findings   Reproductive/Obstetrics                            Anesthesia Physical Anesthesia Plan  ASA: III  Anesthesia Plan: General   Post-op Pain Management:    Induction: Intravenous  PONV Risk Score and Plan: 2 and Dexamethasone, Ondansetron and Treatment may vary due to age or medical condition  Airway Management Planned: Oral ETT and Video Laryngoscope Planned  Additional Equipment:   Intra-op Plan:   Post-operative Plan: Extubation in OR  Informed Consent: I have reviewed the patients History and Physical, chart, labs and discussed the procedure including the risks, benefits and alternatives for the proposed anesthesia with the patient or authorized representative who has indicated his/her understanding and acceptance.     Dental advisory given  Plan  Discussed with: CRNA  Anesthesia Plan Comments:        Anesthesia Quick Evaluation

## 2019-08-30 ENCOUNTER — Encounter (HOSPITAL_COMMUNITY)
Admission: RE | Disposition: A | Discharge status: Home / Self Care | Payer: Self-pay | Source: Home / Self Care | Attending: Thoracic Surgery (Cardiothoracic Vascular Surgery)

## 2019-08-30 ENCOUNTER — Other Ambulatory Visit: Payer: Self-pay

## 2019-08-30 ENCOUNTER — Ambulatory Visit (HOSPITAL_COMMUNITY): Payer: Medicare Other

## 2019-08-30 ENCOUNTER — Ambulatory Visit (HOSPITAL_COMMUNITY): Payer: Medicare Other | Admitting: Physician Assistant

## 2019-08-30 ENCOUNTER — Ambulatory Visit (HOSPITAL_COMMUNITY)
Admission: RE | Admit: 2019-08-30 | Discharge: 2019-08-30 | Disposition: A | Discharge status: Home / Self Care | Payer: Medicare Other | Attending: Thoracic Surgery (Cardiothoracic Vascular Surgery) | Admitting: Thoracic Surgery (Cardiothoracic Vascular Surgery)

## 2019-08-30 ENCOUNTER — Encounter (HOSPITAL_COMMUNITY): Payer: Self-pay | Admitting: Thoracic Surgery (Cardiothoracic Vascular Surgery)

## 2019-08-30 DIAGNOSIS — Z974 Presence of external hearing-aid: Secondary | ICD-10-CM | POA: Diagnosis not present

## 2019-08-30 DIAGNOSIS — M8580 Other specified disorders of bone density and structure, unspecified site: Secondary | ICD-10-CM | POA: Diagnosis not present

## 2019-08-30 DIAGNOSIS — Z888 Allergy status to other drugs, medicaments and biological substances status: Secondary | ICD-10-CM | POA: Insufficient documentation

## 2019-08-30 DIAGNOSIS — Z882 Allergy status to sulfonamides status: Secondary | ICD-10-CM | POA: Diagnosis not present

## 2019-08-30 DIAGNOSIS — Z85118 Personal history of other malignant neoplasm of bronchus and lung: Secondary | ICD-10-CM | POA: Insufficient documentation

## 2019-08-30 DIAGNOSIS — Z79899 Other long term (current) drug therapy: Secondary | ICD-10-CM | POA: Insufficient documentation

## 2019-08-30 DIAGNOSIS — R590 Localized enlarged lymph nodes: Secondary | ICD-10-CM | POA: Diagnosis not present

## 2019-08-30 DIAGNOSIS — M1990 Unspecified osteoarthritis, unspecified site: Secondary | ICD-10-CM | POA: Insufficient documentation

## 2019-08-30 DIAGNOSIS — R591 Generalized enlarged lymph nodes: Secondary | ICD-10-CM | POA: Diagnosis not present

## 2019-08-30 DIAGNOSIS — I2510 Atherosclerotic heart disease of native coronary artery without angina pectoris: Secondary | ICD-10-CM | POA: Diagnosis not present

## 2019-08-30 DIAGNOSIS — I255 Ischemic cardiomyopathy: Secondary | ICD-10-CM | POA: Insufficient documentation

## 2019-08-30 DIAGNOSIS — Z880 Allergy status to penicillin: Secondary | ICD-10-CM | POA: Diagnosis not present

## 2019-08-30 DIAGNOSIS — I252 Old myocardial infarction: Secondary | ICD-10-CM | POA: Insufficient documentation

## 2019-08-30 DIAGNOSIS — K219 Gastro-esophageal reflux disease without esophagitis: Secondary | ICD-10-CM | POA: Diagnosis not present

## 2019-08-30 DIAGNOSIS — E785 Hyperlipidemia, unspecified: Secondary | ICD-10-CM | POA: Diagnosis not present

## 2019-08-30 DIAGNOSIS — G629 Polyneuropathy, unspecified: Secondary | ICD-10-CM | POA: Diagnosis not present

## 2019-08-30 DIAGNOSIS — I714 Abdominal aortic aneurysm, without rupture: Secondary | ICD-10-CM | POA: Insufficient documentation

## 2019-08-30 DIAGNOSIS — Z923 Personal history of irradiation: Secondary | ICD-10-CM | POA: Diagnosis not present

## 2019-08-30 DIAGNOSIS — H409 Unspecified glaucoma: Secondary | ICD-10-CM | POA: Diagnosis not present

## 2019-08-30 DIAGNOSIS — R911 Solitary pulmonary nodule: Secondary | ICD-10-CM | POA: Insufficient documentation

## 2019-08-30 DIAGNOSIS — Z9889 Other specified postprocedural states: Secondary | ICD-10-CM

## 2019-08-30 DIAGNOSIS — Z419 Encounter for procedure for purposes other than remedying health state, unspecified: Secondary | ICD-10-CM

## 2019-08-30 DIAGNOSIS — I10 Essential (primary) hypertension: Secondary | ICD-10-CM | POA: Diagnosis not present

## 2019-08-30 DIAGNOSIS — Z9981 Dependence on supplemental oxygen: Secondary | ICD-10-CM | POA: Insufficient documentation

## 2019-08-30 DIAGNOSIS — J449 Chronic obstructive pulmonary disease, unspecified: Secondary | ICD-10-CM | POA: Diagnosis not present

## 2019-08-30 DIAGNOSIS — I739 Peripheral vascular disease, unspecified: Secondary | ICD-10-CM | POA: Insufficient documentation

## 2019-08-30 DIAGNOSIS — Z91041 Radiographic dye allergy status: Secondary | ICD-10-CM | POA: Insufficient documentation

## 2019-08-30 DIAGNOSIS — R918 Other nonspecific abnormal finding of lung field: Secondary | ICD-10-CM

## 2019-08-30 DIAGNOSIS — Z881 Allergy status to other antibiotic agents status: Secondary | ICD-10-CM | POA: Insufficient documentation

## 2019-08-30 DIAGNOSIS — Z7982 Long term (current) use of aspirin: Secondary | ICD-10-CM | POA: Diagnosis not present

## 2019-08-30 DIAGNOSIS — Z87891 Personal history of nicotine dependence: Secondary | ICD-10-CM | POA: Insufficient documentation

## 2019-08-30 DIAGNOSIS — Z955 Presence of coronary angioplasty implant and graft: Secondary | ICD-10-CM | POA: Insufficient documentation

## 2019-08-30 HISTORY — PX: VIDEO BRONCHOSCOPY WITH ENDOBRONCHIAL NAVIGATION: SHX6175

## 2019-08-30 HISTORY — PX: VIDEO BRONCHOSCOPY WITH ENDOBRONCHIAL ULTRASOUND: SHX6177

## 2019-08-30 SURGERY — VIDEO BRONCHOSCOPY WITH ENDOBRONCHIAL NAVIGATION
Anesthesia: General

## 2019-08-30 MED ORDER — PROPOFOL 10 MG/ML IV BOLUS
INTRAVENOUS | Status: AC
  Filled 2019-08-30: qty 20

## 2019-08-30 MED ORDER — LIDOCAINE 2% (20 MG/ML) 5 ML SYRINGE
INTRAMUSCULAR | Status: AC
  Filled 2019-08-30: qty 5

## 2019-08-30 MED ORDER — PHENYLEPHRINE HCL (PRESSORS) 10 MG/ML IV SOLN
INTRAVENOUS | Status: DC | PRN
  Administered 2019-08-30 (×2): 80 ug via INTRAVENOUS
  Administered 2019-08-30: 40 ug via INTRAVENOUS
  Administered 2019-08-30: 120 ug via INTRAVENOUS

## 2019-08-30 MED ORDER — ROCURONIUM BROMIDE 10 MG/ML (PF) SYRINGE
PREFILLED_SYRINGE | INTRAVENOUS | Status: AC
  Filled 2019-08-30: qty 10

## 2019-08-30 MED ORDER — ONDANSETRON HCL 4 MG/2ML IJ SOLN: 4.0000 mg | Freq: Once | INTRAMUSCULAR | Status: DC | PRN

## 2019-08-30 MED ORDER — LIDOCAINE 2% (20 MG/ML) 5 ML SYRINGE
INTRAMUSCULAR | Status: DC | PRN
  Administered 2019-08-30: 80 mg via INTRAVENOUS

## 2019-08-30 MED ORDER — ACETAMINOPHEN 500 MG PO TABS
1000.0000 mg | ORAL_TABLET | Freq: Once | ORAL | Status: DC
  Filled 2019-08-30: qty 2

## 2019-08-30 MED ORDER — ONDANSETRON HCL 4 MG/2ML IJ SOLN
INTRAMUSCULAR | Status: AC
  Filled 2019-08-30: qty 2

## 2019-08-30 MED ORDER — DEXAMETHASONE SODIUM PHOSPHATE 10 MG/ML IJ SOLN
INTRAMUSCULAR | Status: AC
  Filled 2019-08-30: qty 1

## 2019-08-30 MED ORDER — PHENYLEPHRINE 40 MCG/ML (10ML) SYRINGE FOR IV PUSH (FOR BLOOD PRESSURE SUPPORT)
PREFILLED_SYRINGE | INTRAVENOUS | Status: AC
  Filled 2019-08-30: qty 10

## 2019-08-30 MED ORDER — FENTANYL CITRATE (PF) 100 MCG/2ML IJ SOLN
INTRAMUSCULAR | Status: DC | PRN
  Administered 2019-08-30: 100 ug via INTRAVENOUS

## 2019-08-30 MED ORDER — ROCURONIUM BROMIDE 10 MG/ML (PF) SYRINGE
PREFILLED_SYRINGE | INTRAVENOUS | Status: DC | PRN
  Administered 2019-08-30: 40 mg via INTRAVENOUS
  Administered 2019-08-30: 60 mg via INTRAVENOUS

## 2019-08-30 MED ORDER — DEXAMETHASONE SODIUM PHOSPHATE 10 MG/ML IJ SOLN
INTRAMUSCULAR | Status: DC | PRN
Start: 2019-08-30 — End: 2019-08-30
  Administered 2019-08-30: 4 mg via INTRAVENOUS

## 2019-08-30 MED ORDER — ONDANSETRON HCL 4 MG/2ML IJ SOLN
INTRAMUSCULAR | Status: DC | PRN
  Administered 2019-08-30: 4 mg via INTRAVENOUS

## 2019-08-30 MED ORDER — FENTANYL CITRATE (PF) 100 MCG/2ML IJ SOLN: 25.0000 ug | INTRAMUSCULAR | Status: DC | PRN

## 2019-08-30 MED ORDER — LACTATED RINGERS IV SOLN: INTRAVENOUS | Status: DC | PRN

## 2019-08-30 MED ORDER — CHLORHEXIDINE GLUCONATE 0.12 % MT SOLN
15.0000 mL | Freq: Once | OROMUCOSAL | Status: AC
  Administered 2019-08-30: 15 mL via OROMUCOSAL
  Filled 2019-08-30: qty 15

## 2019-08-30 MED ORDER — PHENYLEPHRINE HCL-NACL 10-0.9 MG/250ML-% IV SOLN
INTRAVENOUS | Status: DC | PRN
Start: 2019-08-30 — End: 2019-08-30
  Administered 2019-08-30: 30 ug/min via INTRAVENOUS

## 2019-08-30 MED ORDER — PROPOFOL 10 MG/ML IV BOLUS
INTRAVENOUS | Status: DC | PRN
  Administered 2019-08-30: 140 mg via INTRAVENOUS

## 2019-08-30 MED ORDER — SUGAMMADEX SODIUM 200 MG/2ML IV SOLN
INTRAVENOUS | Status: DC | PRN
Start: 2019-08-30 — End: 2019-08-30
  Administered 2019-08-30: 200 mg via INTRAVENOUS

## 2019-08-30 MED ORDER — FENTANYL CITRATE (PF) 250 MCG/5ML IJ SOLN
INTRAMUSCULAR | Status: AC
  Filled 2019-08-30: qty 5

## 2019-08-30 MED ORDER — ORAL CARE MOUTH RINSE: 15.0000 mL | Freq: Once | OROMUCOSAL | Status: AC

## 2019-08-30 SURGICAL SUPPLY — 53 items
ADAPTER BRONCHOSCOPE OLYMPUS (ADAPTER) IMPLANT
ADAPTER VALVE BIOPSY EBUS (MISCELLANEOUS) IMPLANT
ADPR BSCP OLMPS EDG (ADAPTER)
ADPTR VALVE BIOPSY EBUS (MISCELLANEOUS)
BLADE CLIPPER SURG (BLADE) IMPLANT
BRUSH BIOPSY BRONCH 10 SDTNB (MISCELLANEOUS) ×4 IMPLANT
BRUSH BIOPSY BRONCH 10MM SDTNB (MISCELLANEOUS) ×2
BRUSH CYTOL CELLEBRITY 1.5X140 (MISCELLANEOUS) IMPLANT
BRUSH SUPERTRAX BIOPSY (INSTRUMENTS) IMPLANT
BRUSH SUPERTRAX NDL-TIP CYTO (INSTRUMENTS) ×6 IMPLANT
CANISTER SUCT 3000ML PPV (MISCELLANEOUS) ×3 IMPLANT
CNTNR URN SCR LID CUP LEK RST (MISCELLANEOUS) ×4 IMPLANT
CONT SPEC 4OZ STRL OR WHT (MISCELLANEOUS) ×12
COVER BACK TABLE 60X90IN (DRAPES) ×6 IMPLANT
FILTER STRAW FLUID ASPIR (MISCELLANEOUS) IMPLANT
FORCEPS BIOP RJ4 1.8 (CUTTING FORCEPS) IMPLANT
FORCEPS BIOP SUPERTRX PREMAR (INSTRUMENTS) ×3 IMPLANT
FORCEPS RADIAL JAW LRG 4 PULM (INSTRUMENTS) IMPLANT
GAUZE SPONGE 4X4 12PLY STRL (GAUZE/BANDAGES/DRESSINGS) ×3 IMPLANT
GLOVE SURG SIGNA 7.5 PF LTX (GLOVE) ×6 IMPLANT
GOWN STRL REUS W/ TWL XL LVL3 (GOWN DISPOSABLE) ×2 IMPLANT
GOWN STRL REUS W/TWL XL LVL3 (GOWN DISPOSABLE) ×6
KIT CLEAN ENDO COMPLIANCE (KITS) ×6 IMPLANT
KIT ILLUMISITE 180 PROCEDURE (KITS) ×3 IMPLANT
KIT ILLUMISITE 90 PROCEDURE (KITS) IMPLANT
KIT TURNOVER KIT B (KITS) ×6 IMPLANT
MARKER SKIN DUAL TIP RULER LAB (MISCELLANEOUS) ×6 IMPLANT
NEEDLE ASPIRATION VIZISHOT 19G (NEEDLE) ×3 IMPLANT
NEEDLE ASPIRATION VIZISHOT 21G (NEEDLE) IMPLANT
NEEDLE BLUNT 18X1 FOR OR ONLY (NEEDLE) IMPLANT
NEEDLE SUPERTRX PREMARK BIOPSY (NEEDLE) ×6 IMPLANT
NS IRRIG 1000ML POUR BTL (IV SOLUTION) ×6 IMPLANT
OIL SILICONE PENTAX (PARTS (SERVICE/REPAIRS)) ×3 IMPLANT
PAD ARMBOARD 7.5X6 YLW CONV (MISCELLANEOUS) ×12 IMPLANT
PATCHES PATIENT (LABEL) ×9 IMPLANT
RADIAL JAW LRG 4 PULMONARY (INSTRUMENTS)
SYR 20ML ECCENTRIC (SYRINGE) ×6 IMPLANT
SYR 20ML LL LF (SYRINGE) ×6 IMPLANT
SYR 30ML LL (SYRINGE) ×3 IMPLANT
SYR 3ML LL SCALE MARK (SYRINGE) IMPLANT
SYR 50ML LL SCALE MARK (SYRINGE) ×3 IMPLANT
SYR 5ML LL (SYRINGE) ×6 IMPLANT
SYR 5ML LUER SLIP (SYRINGE) ×3 IMPLANT
TOWEL GREEN STERILE (TOWEL DISPOSABLE) ×6 IMPLANT
TOWEL GREEN STERILE FF (TOWEL DISPOSABLE) ×6 IMPLANT
TRAP SPECIMEN MUCUS 40CC (MISCELLANEOUS) ×6 IMPLANT
TUBE CONNECTING 20'X1/4 (TUBING) ×2
TUBE CONNECTING 20X1/4 (TUBING) ×4 IMPLANT
UNDERPAD 30X36 HEAVY ABSORB (UNDERPADS AND DIAPERS) ×3 IMPLANT
VALVE BIOPSY  SINGLE USE (MISCELLANEOUS) ×6
VALVE BIOPSY SINGLE USE (MISCELLANEOUS) ×2 IMPLANT
VALVE SUCTION BRONCHIO DISP (MISCELLANEOUS) ×6 IMPLANT
WATER STERILE IRR 1000ML POUR (IV SOLUTION) ×6 IMPLANT

## 2019-08-30 NOTE — Discharge Instructions (Addendum)
Do not drive or engage in heavy physical activity for 24 hours  You may resume normal activities tomorrow  You may cough up small amounts of blood over the next few days  You may use acetaminophen (Tylenol) if needed for discomfort. You may use an over the counter cough medication if needed.  Call [phone] if you develop chest pain, shortness of breath, fever > 101 F or cough up more than 2 tablespoons of blood.  My office will contact you with a follow up appointment

## 2019-08-30 NOTE — Brief Op Note (Signed)
08/30/2019  10:16 AM  PATIENT:  [name]  84 y.o. male  PRE-OPERATIVE DIAGNOSIS:  BILATERAL LUNG NODULES RIGHT HILAR ADENOPATHY  POST-OPERATIVE DIAGNOSIS:  BILATERAL LUNG NODULES RIGHT HILAR ADENOPATHY  PROCEDURE:  Procedure(s): VIDEO BRONCHOSCOPY WITH ENDOBRONCHIAL NAVIGATION (N/A) VIDEO BRONCHOSCOPY WITH ENDOBRONCHIAL ULTRASOUND (N/A) Needle aspirations, brushings and transbronchial biopsies  SURGEON:  Surgeon(s) and Role:    * Melrose Nakayama, MD - Primary  PHYSICIAN ASSISTANT:   ASSISTANTS: none   ANESTHESIA:   general  EBL:  minimal  BLOOD ADMINISTERED:none  DRAINS: none   LOCAL MEDICATIONS USED:  NONE  SPECIMEN:  Source of Specimen:  RUL, LUL, 4R node, 10R node  DISPOSITION OF SPECIMEN:  PATHOLOGY  COUNTS:  NO endo  TOURNIQUET:  * No tourniquets in log *  DICTATION: .Other Dictation: Dictation Number -  PLAN OF CARE: Discharge to home after PACU  PATIENT DISPOSITION:  PACU - hemodynamically stable.   Delay start of Pharmacological VTE agent (>24hrs) due to surgical blood loss or risk of bleeding: not applicable

## 2019-08-30 NOTE — Anesthesia Procedure Notes (Signed)
Procedure Name: Intubation Date/Time: 08/30/2019 8:13 AM Performed by: Kyung Rudd, CRNA Pre-anesthesia Checklist: Patient identified, Emergency Drugs available, Suction available and Patient being monitored Patient Re-evaluated:Patient Re-evaluated prior to induction Oxygen Delivery Method: Circle system utilized Preoxygenation: Pre-oxygenation with 100% oxygen Induction Type: IV induction Ventilation: Mask ventilation without difficulty and Oral airway inserted - appropriate to patient size Laryngoscope Size: Glidescope and 4 Tube type: Oral Tube size: 8.5 mm Number of attempts: 1 Airway Equipment and Method: Stylet and Video-laryngoscopy Placement Confirmation: ETT inserted through vocal cords under direct vision,  positive ETCO2 and breath sounds checked- equal and bilateral Secured at: 22 cm Tube secured with: Tape Dental Injury: Teeth and Oropharynx as per pre-operative assessment  Comments: AOI by Collene Mares, SRNA with Dr. Gifford Shave supervising. Grade I view on screen with Glidescope.

## 2019-08-30 NOTE — Progress Notes (Signed)
Per patient and wife's request, bilateral hearing aids, wallet, and one pair of glasses taken out to wife Silva Bandy) in waiting area.

## 2019-08-30 NOTE — Transfer of Care (Signed)
Immediate Anesthesia Transfer of Care Note  Patient: [name]  Procedure(s) Performed: VIDEO BRONCHOSCOPY WITH ENDOBRONCHIAL NAVIGATION (N/A ) VIDEO BRONCHOSCOPY WITH ENDOBRONCHIAL ULTRASOUND (N/A )  Patient Location: PACU  Anesthesia Type:General  Level of Consciousness: awake, alert  and oriented  Airway & Oxygen Therapy: Patient Spontanous Breathing and Patient connected to nasal cannula oxygen  Post-op Assessment: Report given to RN, Post -op Vital signs reviewed and stable and Patient moving all extremities X 4  Post vital signs: Reviewed and stable  Last Vitals:  Vitals Value Taken Time  BP 111/67 08/30/19 1014  Temp    Pulse 69 08/30/19 1014  Resp 12 08/30/19 1014  SpO2 86 % 08/30/19 1014  Vitals shown include unvalidated device data.  Last Pain:  Vitals:   08/30/19 0642  TempSrc:   PainSc: 0-No pain         Complications: No complications documented.

## 2019-08-30 NOTE — Anesthesia Postprocedure Evaluation (Signed)
Anesthesia Post Note  Patient: FLAVIUS REPSHER  Procedure(s) Performed: VIDEO BRONCHOSCOPY WITH ENDOBRONCHIAL NAVIGATION (N/A ) VIDEO BRONCHOSCOPY WITH ENDOBRONCHIAL ULTRASOUND (N/A )     Patient location during evaluation: PACU Anesthesia Type: General Level of consciousness: awake and alert, oriented and awake Pain management: pain level controlled Vital Signs Assessment: post-procedure vital signs reviewed and stable Respiratory status: spontaneous breathing, nonlabored ventilation and respiratory function stable Cardiovascular status: blood pressure returned to baseline and stable Postop Assessment: no apparent nausea or vomiting Anesthetic complications: no   No complications documented.  Last Vitals:  Vitals:   08/30/19 1100 08/30/19 1115  BP: 105/70 111/68  Pulse: 71 66  Resp: 16 16  Temp: 36.7 C   SpO2: 96% 94%    Last Pain:  Vitals:   08/30/19 1115  TempSrc:   PainSc: 0-No pain                 Catalina Gravel

## 2019-08-30 NOTE — Op Note (Signed)
NAME: CASHUS, HALTERMAN MEDICAL RECORD [id number] ACCOUNT [id number] DATE OF BIRTH:08/23/1935 FACILITY: [facility] LOCATION: [facility] PHYSICIAN:Quetzally Callas Chaya Jan, MD  OPERATIVE REPORT  DATE OF PROCEDURE:  08/30/2019  PREOPERATIVE DIAGNOSES:   1.  Right upper lobe nodule. 2.  Hilar adenopathy.   3.  Hypermetabolic activity at the site of previous left upper lobe cancer.  POSTOPERATIVE DIAGNOSES:   1.  Right upper lobe nodule. 2.  Hilar adenopathy.   3.  Hypermetabolic activity at the site of previous left upper lobe cancer.  PROCEDURES:   1.  Endobronchial ultrasound with mediastinal and hilar lymph node aspirations. 2.  Electromagnetic navigational bronchoscopy with needle aspirations, brushings and transbronchial biopsies.  SURGEON:  Modesto Charon, MD  ASSISTANT:  None.  ANESTHESIA:  General.  FINDINGS:  Initial preps nondiagnostic.  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  On followup CT, there has been an increase in soft tissue density of the  previously treated area.  On a PET CT, there was progression of the previously treated areas as well as a new 7 mm right upper lobe nodule that was hypermetabolic and a focus of hypermetabolism in the right hilar region.  He was advised to undergo  endobronchial ultrasound and navigational bronchoscopy for diagnostic and staging purposes.  The indications, risks, benefits, and alternatives were discussed in detail with the patient.  He understood and accepted the risks and agreed to proceed.  DESCRIPTION OF PROCEDURE:  Mr. Salton was brought to the operating room on 08/30/2019.  He had induction of general anesthesia and was intubated.  Sequential compression devices were placed on the calves for DVT prophylaxis.  Active warming was in  place.  A timeout was performed.    Flexible fiberoptic  bronchoscopy was performed via the endotracheal tube.  It revealed normal endobronchial anatomy with no endobronchial lesions to the level of the subsegmental bronchi.  The endobronchial ultrasound probe was placed and advanced to the right hilar region.  No pathologically enlarged nodes were noted.  There were nodes in the 4R and 10R location that appeared relatively normal in size.  These had limited windows for  aspiration, but did appear to be approachable.  Needle aspirations were performed from both nodes.  Three aspirations were done on each.  Aspirations were obtained both with and without suction applied.  All aspirations were performed with real time  ultrasound visualization.  On quick prep, no tumor was seen in either node.  The bronchoscope was replaced.  The locatable guide for navigation was placed and registration was performed.  There was good correlation of the video and virtual bronchoscopy.  The catheter was directed to the right upper lobe and the appropriate  segmental bronchus was cannulated.  The catheter was advanced to within a centimeter of the nodule with good orientation.  An attempt was made to perform a local registration, but the nodule could not be visualized on the fluoroscopy images.  The  catheter did appear to be in good position based on comparison of the  fluoroscopy images to the CT images.  All sampling of the right and left upper lobe nodules was done with thoracoscopic fluoroscopic visualization.  Needle aspirations were performed  and then brushings were obtained with both the needle brush and a triple brush and finally multiple biopsies were obtained.  These were transbronchial biopsies.  The catheter remained in good  position for the most part, but the locatable guide had to be  replaced and the catheter repositioned on a couple of occasions during the sampling.  Next, the locatable guide was directed to the left upper lobe bronchus and advanced in good  position aligned with the previously treated area that was confirmed with  the fiducials.  Sampling was performed of this nodule, again using the needle aspirations, followed by brushings with both a needle brush and the triple brush and finally multiple biopsies were obtained.  With one of the biopsies, one of the fiducial  markers was removed.  Quick preps of the right upper lobe and left upper lobe lesions did not show any definite malignant cells.  The catheter then was placed back at the right upper lobe bronchus and bronchoalveolar lavage was performed; 100 mL of  saline was instilled and approximately 10 mL returned.  This was also sent for cytology.  The total fluoroscopy time was 5.3 minutes with a dose of 54 milligray.  Inspection with fluoroscopy revealed no evidence of pneumothorax.  The patient then was  extubated in the operating room and taken to the [facility] in good condition.  VN/NUANCE  D:08/30/2019 T:08/30/2019 JOB:[id number]

## 2019-08-30 NOTE — Interval H&P Note (Signed)
History and Physical Interval Note:  08/30/2019 7:58 AM  [name]  has presented today for surgery, with the diagnosis of BILATERAL LUNG NODULES RIGHT HILAR ADENOPATHY.  The various methods of treatment have been discussed with the patient and family. After consideration of risks, benefits and other options for treatment, the patient has consented to  Procedure(s): VIDEO BRONCHOSCOPY WITH ENDOBRONCHIAL NAVIGATION (N/A) VIDEO BRONCHOSCOPY WITH ENDOBRONCHIAL ULTRASOUND (N/A) as a surgical intervention.  The patient's history has been reviewed, patient examined, no change in status, stable for surgery.  I have reviewed the patient's chart and labs.  Questions were answered to the patient's satisfaction.     Melrose Nakayama

## 2019-08-31 LAB — CYTOLOGY - NON PAP

## 2019-08-31 LAB — SURGICAL PATHOLOGY

## 2019-09-06 ENCOUNTER — Inpatient Hospital Stay: Payer: Medicare Other | Admitting: Internal Medicine

## 2019-09-06 ENCOUNTER — Ambulatory Visit: Payer: Medicare Other | Admitting: Thoracic Surgery (Cardiothoracic Vascular Surgery)

## 2019-09-06 ENCOUNTER — Other Ambulatory Visit: Payer: Self-pay | Admitting: *Deleted

## 2019-09-06 ENCOUNTER — Other Ambulatory Visit: Payer: Self-pay

## 2019-09-06 ENCOUNTER — Encounter: Payer: Self-pay | Admitting: Internal Medicine

## 2019-09-06 VITALS — BP 111/72 | HR 87 | Temp 97.7°F | Resp 24 | Ht 71.0 in | Wt 190.0 lb

## 2019-09-06 VITALS — BP 135/82 | HR 92 | Temp 98.1°F | Resp 18 | Ht 71.0 in | Wt 190.0 lb

## 2019-09-06 DIAGNOSIS — C3492 Malignant neoplasm of unspecified part of left bronchus or lung: Secondary | ICD-10-CM | POA: Diagnosis not present

## 2019-09-06 DIAGNOSIS — R911 Solitary pulmonary nodule: Secondary | ICD-10-CM | POA: Diagnosis not present

## 2019-09-06 DIAGNOSIS — C3412 Malignant neoplasm of upper lobe, left bronchus or lung: Secondary | ICD-10-CM | POA: Diagnosis not present

## 2019-09-06 NOTE — Progress Notes (Signed)
The proposed treatment discussed in cancer conference 09/06/19 is for discussion purpose only and is not a binding recommendation.  The patient was not physically examined nor present for their treatment options.  Therefore, final treatment plans cannot be decided.

## 2019-09-06 NOTE — Progress Notes (Signed)
[address]       [place] [address]             [phone]       HPI: [name] returns to discuss the results of his bronchoscopy.  [name] is an 84 year old man with a history of CAD, ischemic cardiomyopathy, tobacco abuse, COPD, stage Ia non-small cell carcinoma of the left upper lobe, stereotactic radiation, recurrent nerve dysfunction with vocal cord paralysis, glaucoma, hepatitis, reflux, arthritis, and neuropathy.  He was diagnosed with an adenocarcinoma of the left upper lobe back in 2018.  I did a biopsy and placed fiducials.  He was treated with stereotactic radiation and had another round of radiation later.  On CT he has had some progressively increasing soft tissue density in the left apex.  And on his new CT there was a new right upper lobe nodule and on PET there was some activity in both areas as well as the right hilar region.  I did a navigational bronchoscopy and endobronchial ultrasound last week.  He tolerated the procedure well without any complications.  He now returns to discuss the results, although he has already seen Dr. Julien Nordmann earlier today.   Past Medical History:  Diagnosis Date  . AAA (abdominal aortic aneurysm) (H[facility])    measures 2.7 cm.  . Arthritis   . Back pain   . BPH (benign prostatic hypertrophy)   . Bruises easily   . CAD (coronary artery disease)   . Cervical spine fracture ([facility]) feb 1990   C 6, C 7 and T 1, no surgery done wore halo brace  . CLL (chronic lymphocytic leukemia) ([facility])    hx of worked up 2009 by dr Benay Spice, no tx done, released by dr Benay Spice  . Complication of anesthesia    cannot lie on right side gets vertigo and trouble turning neck to right  . COPD (chronic obstructive pulmonary disease) ([facility]) may 2005  . Erectile dysfunction   . Fatigue   . GERD (gastroesophageal reflux disease)   . Glaucoma   . Hepatitis as child   serum hepatitis  . History of oxygen administration    oxygen  @2   l/m  nasally at bedtime.  Marland Kitchen History of radiation therapy 06/22/16-06/28/16   Left upper lobe of lung/ 54 Gy in 3 fractions  . Hyperlipidemia   . Hypertension   . Impaired hearing    wears Hearing aids  . Ischemic cardiomyopathy   . Leg pain   . Lumbar disc disease   . Lung cancer ([place]) dx'd 03/18/16  . Microscopic hematuria   . Myocardial infarct ([facility]) 03-17-2009   Hx MI 2000  . Neuropathy    both feet and legs  . Osteopenia   . Pneumonia    hx x2  . Thyroid nodule      Current Outpatient Medications  Medication Sig Dispense Refill  . albuterol (PROAIR HFA) 108 (90 BASE) MCG/ACT inhaler Inhale 2 puffs into the lungs every 6 (six) hours as needed for wheezing or shortness of breath.     Marland Kitchen aspirin 81 MG tablet Take 81 mg by mouth daily.     Marland Kitchen atorvastatin (LIPITOR) 20 MG tablet Take 20 mg by mouth daily.     Marland Kitchen CALCIUM-MAGNESIUM-VITAMIN D PO Take 1 tablet by mouth daily.    Marland Kitchen gabapentin (NEURONTIN) 300 MG capsule Take 600 mg by mouth 2 (two) times daily.     Marland Kitchen latanoprost (XALATAN) 0.005 %  ophthalmic solution Place 1 drop into both eyes at bedtime.     . metoprolol succinate (TOPROL-XL) 25 MG 24 hr tablet Take 25 mg by mouth every morning.     . nitroGLYCERIN (NITROSTAT) 0.4 MG SL tablet Place 0.4 mg under the tongue every 5 (five) minutes as needed for chest pain. For chest pain     . OXYGEN Inhale 2 L into the lungs at bedtime.     . sucralfate (CARAFATE) 1 g tablet Take 1 tablet (1 g total) by mouth 4 (four) times daily -  with meals and at bedtime. 30 mins prior to meals, crush and dissolve in 10 mL of warm water prior to swallowing (Patient taking differently: Take 1 g by mouth 2 (two) times daily. 30 mins prior to meals, crush and dissolve in 10 mL of warm water prior to swallowing) 120 tablet 1  . Tamsulosin HCl (FLOMAX) 0.4 MG CAPS Take 0.4 mg by mouth every evening.     . timolol (TIMOPTIC) 0.5 % ophthalmic solution Place 1 drop into both eyes 2 (two) times daily.      No current  facility-administered medications for this visit.    Physical Exam BP 111/72 (BP Location: Left Arm, Patient Position: Sitting, Cuff Size: Normal)   Pulse 87   Temp 97.7 F (36.5 C) (Temporal)   Resp (!) 24 Comment: RA  Ht 5\' 11"  (1.803 m)   Wt 190 lb (86.2 kg)   SpO2 (!) 89% Comment: RA  BMI 26.21 kg/m  84 year old man in no acute distress Weak, hoarse voice unchanged from baseline  Diagnostic Tests: All needle aspirations, brushings, and biopsies were negative for malignancy  Impression: [name] an 84 year old man with a history of a stage Ia adenocarcinoma of the left upper lobe treated with stereotactic radiation followed by additional radiation.  He has some increasing soft tissue density in the left upper lobe as well as a new nodule in the right upper lobe and right hilum.  I did a navigational bronchoscopy and endobronchial ultrasound last week.  With the endobronchial ultrasound he had good aspirations from 2 different nodes.  There were lymphoid cells but no tumor was seen.  The right upper lobe nodule so small that even though were taken samples from the facility we easily could have missed it.  On the left side we were getting good biopsies from the region of the previous treatment in fact one of the fiducial markers came out with the biopsy forceps, so we know we were in the right general vicinity.  Those biopsies were negative as well.  I discussed with [name] that this does not rule out the possibility of either new or recurrent cancer.  None of these areas are amenable to percutaneous biopsy.  I do not think it is worth repeating a bronchoscopic biopsy at this point, but I do think we need to keep a close eye on this.  The consensus of her multidisciplinary conference was to repeat a CT in 3 months and then see if there is anything that needs consideration for biopsy.  I will be happy to attempt it again if needed.  He is already seeing Dr. Julien Nordmann and he is  going to arrange for that CT.  I will just wait to hear from Dr. Julien Nordmann if there is any way I can help.    Omar Nakayama, MD [facility] [phone]

## 2019-09-06 NOTE — Progress Notes (Signed)
[facility] [phone]   [phone]  OFFICE PROGRESS NOTE  Wenda Low, MD [address] [place] [address]  DIAGNOSIS: History of a stage Ia non-small cell lung cancer, adenocarcinoma presented with left upper lobe pulmonary nodule in February 2018.  He has suspicious enlargement of the left upper lobe lesion as well as suspicious lesions in the right upper lobe and right hilar adenopathy.  PRIOR THERAPY: 1) status post SBRT to the left upper lobe pulmonary nodule in 2018. 2) status post conventional radiotherapy to the enlarging left upper lobe lung nodule under the care of Dr. Dorathy Daft in January 2021.  CURRENT THERAPY: Observation.  INTERVAL HISTORY: [name] 84 y.o. male returns to the clinic today for follow-up visit.  The patient is feeling fine today with no concerning complaints except for cough productive of yellowish sputum.  He denied having any chest pain, shortness of breath except with exertion with no hemoptysis.  He denied having any recent weight loss or night sweats.  He has no nausea, vomiting, diarrhea or constipation.  He has no headache or visual changes.  He underwent bronchoscopy with endobronchial ultrasound and biopsy under the care of Dr. Roxan Hockey recently and the final pathology from the left upper lobe as well as the right upper lobe and the right hilar lymph nodes were negative for malignancy.  The patient is here today for evaluation and recommendation regarding his condition.  He had chest x-ray performed recently that showed suspicious right upper lobe pneumonia.  MEDICAL HISTORY: Past Medical History:  Diagnosis Date  . AAA (abdominal aortic aneurysm) (HCC)    measures 2.7 cm.  . Arthritis   . Back pain   . BPH (benign prostatic hypertrophy)   . Bruises easily   . CAD (coronary artery disease)   . Cervical spine fracture ([facility]) feb 1990   C 6, C 7 and T 1, no surgery done wore halo  brace  . CLL (chronic lymphocytic leukemia) ([facility])    hx of worked up 2009 by dr Benay Spice, no tx done, released by dr Benay Spice  . Complication of anesthesia    cannot lie on right side gets vertigo and trouble turning neck to right  . COPD (chronic obstructive pulmonary disease) ([facility]) may 2005  . Erectile dysfunction   . Fatigue   . GERD (gastroesophageal reflux disease)   . Glaucoma   . Hepatitis as child   serum hepatitis  . History of oxygen administration    oxygen  @2   l/m nasally at bedtime.  Marland Kitchen History of radiation therapy 06/22/16-06/28/16   Left upper lobe of lung/ 54 Gy in 3 fractions  . Hyperlipidemia   . Hypertension   . Impaired hearing    wears Hearing aids  . Ischemic cardiomyopathy   . Leg pain   . Lumbar disc disease   . Lung cancer ([facility]) dx'd 03/18/16  . Microscopic hematuria   . Myocardial infarct ([facility]) 03-17-2009   Hx MI 2000  . Neuropathy    both feet and legs  . Osteopenia   . Pneumonia    hx x2  . Thyroid nodule     ALLERGIES:  is allergic to entresto [sacubitril-valsartan], iodinated diagnostic agents, penicillins, sulfonamide derivatives, lumigan [bimatoprost], lyrica [pregabalin], symbicort [budesonide-formoterol fumarate], valsartan, advair diskus [fluticasone-salmeterol], brimonidine tartrate, ciprofloxacin, flovent diskus [fluticasone propionate (inhal)], pulmicort [budesonide], qvar [beclomethasone], spiriva [tiotropium bromide monohydrate], tessalon [benzonatate], bacitracin, flonase [fluticasone propionate], relafen [nabumetone], and tape.  MEDICATIONS:  Current Outpatient Medications  Medication Sig Dispense Refill  . albuterol (PROAIR HFA) 108 (90 BASE) MCG/ACT inhaler Inhale 2 puffs into the lungs every 6 (six) hours as needed for wheezing or shortness of breath.     Marland Kitchen aspirin 81 MG tablet Take 81 mg by mouth daily.     Marland Kitchen atorvastatin (LIPITOR) 20 MG tablet Take 20 mg by mouth daily.     Marland Kitchen CALCIUM-MAGNESIUM-VITAMIN D PO Take 1 tablet by mouth  daily.    Marland Kitchen gabapentin (NEURONTIN) 300 MG capsule Take 600 mg by mouth 2 (two) times daily.     Marland Kitchen latanoprost (XALATAN) 0.005 % ophthalmic solution Place 1 drop into both eyes at bedtime.     . metoprolol succinate (TOPROL-XL) 25 MG 24 hr tablet Take 25 mg by mouth every morning.     . OXYGEN Inhale 2 L into the lungs at bedtime.     . sucralfate (CARAFATE) 1 g tablet Take 1 tablet (1 g total) by mouth 4 (four) times daily -  with meals and at bedtime. 30 mins prior to meals, crush and dissolve in 10 mL of warm water prior to swallowing (Patient taking differently: Take 1 g by mouth 2 (two) times daily. 30 mins prior to meals, crush and dissolve in 10 mL of warm water prior to swallowing) 120 tablet 1  . Tamsulosin HCl (FLOMAX) 0.4 MG CAPS Take 0.4 mg by mouth every evening.     . timolol (TIMOPTIC) 0.5 % ophthalmic solution Place 1 drop into both eyes 2 (two) times daily.     . nitroGLYCERIN (NITROSTAT) 0.4 MG SL tablet Place 0.4 mg under the tongue every 5 (five) minutes as needed for chest pain. For chest pain  (Patient not taking: Reported on 09/06/2019)     No current facility-administered medications for this visit.    SURGICAL HISTORY:  Past Surgical History:  Procedure Laterality Date  . APPENDECTOMY  sept, 10, 2005  . BACK SURGERY  2009   L 2 and L 3   . benign cyst removed from left neck  02-2010  . CERVICAL DISC SURGERY  2005-2008   C 2, C 3 and C 4 2005, rods placed C 6, C 7 and T 1  . COLONOSCOPY WITH PROPOFOL N/A 11/26/2014   Procedure: COLONOSCOPY WITH PROPOFOL;  Surgeon: Garlan Fair, MD;  Location: [facility];  Service: Endoscopy;  Laterality: N/A;  . CORONARY ANGIOPLASTY WITH STENT PLACEMENT  2013   1 stent repair, one stent replaced  . ESOPHAGOGASTRODUODENOSCOPY (EGD) WITH PROPOFOL N/A 11/27/2013   Procedure: ESOPHAGOGASTRODUODENOSCOPY (EGD) WITH PROPOFOL;  Surgeon: Garlan Fair, MD;  Location: [facility];  Service: Endoscopy;  Laterality: N/A;  . EYE SURGERY  Bilateral july 2011   eye surgry for glaucoma  . FUDUCIAL PLACEMENT N/A 05/12/2016   Procedure: PLACEMENT OF FUDUCIAL;  Surgeon: Melrose Nakayama, MD;  Location: [facility];  Service: Thoracic;  Laterality: N/A;  . fusion L 3 and L 4  August 15, 2008  . LEFT HEART CATHETERIZATION WITH CORONARY ANGIOGRAM N/A 08/12/2011   Procedure: LEFT HEART CATHETERIZATION WITH CORONARY ANGIOGRAM;  Surgeon: Larey Dresser, MD;  Location: [facility];  Service: Cardiovascular;  Laterality: N/A;  . NASAL SINUS SURGERY   oct 2005  . right knee arthroscopy  1981  . stent top heart  2011   x 2  . TONSILLECTOMY  1943  . VIDEO BRONCHOSCOPY WITH ENDOBRONCHIAL NAVIGATION N/A 05/12/2016   Procedure: VIDEO BRONCHOSCOPY WITH  ENDOBRONCHIAL NAVIGATION;  Surgeon: Melrose Nakayama, MD;  Location: [facility];  Service: Thoracic;  Laterality: N/A;  . VIDEO BRONCHOSCOPY WITH ENDOBRONCHIAL NAVIGATION N/A 08/30/2019   Procedure: VIDEO BRONCHOSCOPY WITH ENDOBRONCHIAL NAVIGATION;  Surgeon: Melrose Nakayama, MD;  Location: [facility];  Service: Thoracic;  Laterality: N/A;  . VIDEO BRONCHOSCOPY WITH ENDOBRONCHIAL ULTRASOUND N/A 08/30/2019   Procedure: VIDEO BRONCHOSCOPY WITH ENDOBRONCHIAL ULTRASOUND;  Surgeon: Melrose Nakayama, MD;  Location: [facility];  Service: Thoracic;  Laterality: N/A;    REVIEW OF SYSTEMS:  Constitutional: positive for fatigue Eyes: negative Ears, nose, mouth, throat, and face: negative Respiratory: positive for cough and dyspnea on exertion Cardiovascular: negative Gastrointestinal: negative Genitourinary:negative Integument/breast: negative Hematologic/lymphatic: negative Musculoskeletal:negative Neurological: negative Behavioral/Psych: negative Endocrine: negative Allergic/Immunologic: negative   PHYSICAL EXAMINATION: General appearance: alert, cooperative and no distress Head: Normocephalic, without obvious abnormality, atraumatic Neck: no adenopathy, no JVD, supple, symmetrical, trachea midline and  thyroid not enlarged, symmetric, no tenderness/mass/nodules Lymph nodes: Cervical, supraclavicular, and axillary nodes normal. Resp: clear to auscultation bilaterally Back: symmetric, no curvature. ROM normal. No CVA tenderness. Cardio: regular rate and rhythm, S1, S2 normal, no murmur, click, rub or gallop GI: soft, non-tender; bowel sounds normal; no masses,  no organomegaly Extremities: extremities normal, atraumatic, no cyanosis or edema Neurologic: Alert and oriented X 3, normal strength and tone. Normal symmetric reflexes. Normal coordination and gait  ECOG PERFORMANCE STATUS: 1 - Symptomatic but completely ambulatory  Blood pressure 135/82, pulse 92, temperature 98.1 F (36.7 C), temperature source Temporal, resp. rate 18, height 5\' 11"  (1.803 m), weight 190 lb (86.2 kg), SpO2 93 %.  LABORATORY DATA: Lab Results  Component Value Date   WBC 13.4 (H) 08/16/2019   HGB 12.9 (L) 08/16/2019   HCT 40.2 08/16/2019   MCV 92.0 08/16/2019   PLT 184 08/16/2019      Chemistry      Component Value Date/Time   NA 140 08/16/2019 1342   NA 143 09/26/2018 1013   K 3.9 08/16/2019 1342   CL 102 08/16/2019 1342   CO2 29 08/16/2019 1342   BUN 17 08/16/2019 1342   BUN 17 09/26/2018 1013   BUN 11.8 06/22/2016 1207   CREATININE 0.95 08/16/2019 1342   CREATININE 1.0 06/22/2016 1207      Component Value Date/Time   CALCIUM 9.4 08/16/2019 1342   ALKPHOS 78 08/16/2019 1342   AST 14 (L) 08/16/2019 1342   ALT 12 08/16/2019 1342   BILITOT 1.1 08/16/2019 1342       RADIOGRAPHIC STUDIES: DG Chest 2 View  Result Date: 08/27/2019 CLINICAL DATA:  Preop bronchoscopy EXAM: CHEST - 2 VIEW COMPARISON:  05/12/2016 FINDINGS: There is scarring at the right lung base. Metallic markers are present at the left lung apex. No focal airspace consolidation. IMPRESSION: No active cardiopulmonary disease. Electronically Signed   By: Ulyses Jarred M.D.   On: 08/27/2019 23:31   MR BRAIN W WO CONTRAST  Result  Date: 08/28/2019 CLINICAL DATA:  84 year old male with non-small cell lung cancer. Evidence of chest progression on PET-CT last month. Restaging. EXAM: MRI HEAD WITHOUT AND WITH CONTRAST TECHNIQUE: Multiplanar, multiecho pulse sequences of the brain and surrounding structures were obtained without and with intravenous contrast. CONTRAST:  82mL GADAVIST GADOBUTROL 1 MMOL/ML IV SOLN COMPARISON:  Brain MRI 07/07/2016 and earlier. FINDINGS: Brain: No abnormal enhancement identified. No dural thickening. No midline shift, mass effect, or evidence of intracranial mass lesion. No restricted diffusion to suggest acute infarction. No ventriculomegaly, extra-axial collection or acute  intracranial hemorrhage. Cervicomedullary junction and pituitary are within normal limits. Mild generalized cerebral volume loss since 2018. Widely scattered cerebral white matter T2 and FLAIR hyperintensity has not significantly changed. Chronic T2 heterogeneity in the deep gray nuclei is stable and at least part due to perivascular spaces. Patchy T2 hyperintensity in the pons has increased since 2018. No cortical encephalomalacia or chronic cerebral blood products identified. Vascular: Major intracranial vascular flow voids are stable. Pneumatized right anterior clinoid process (normal variant). The major dural venous sinuses are enhancing and appear to be patent. Skull and upper cervical spine: Partially visible cervical spine fusion with mild susceptibility artifact. Negative visible spinal cord. Skull bone marrow signal remains normal. Sinuses/Orbits: Postoperative changes to both globes since 2018. Stable chronic paranasal sinus surgery. Other: Mastoids remain clear. Visible internal auditory structures appear normal. Scalp and face soft tissues appear negative. IMPRESSION: 1. No metastatic disease or acute intracranial abnormality. 2. Chronic signal changes compatible with small vessel disease, mildly progressed since 2018. Electronically  Signed   By: Genevie Ann M.D.   On: 08/28/2019 09:23   DG Chest Port 1 View  Result Date: 08/30/2019 CLINICAL DATA:  Postoperative status. EXAM: PORTABLE CHEST 1 VIEW COMPARISON:  August 27, 2019. FINDINGS: Stable cardiomediastinal silhouette. No pneumothorax or pleural effusion is noted. Airspace opacity is noted laterally in the right upper lobe concerning for pneumonia. Bony thorax is unremarkable. IMPRESSION: Right upper lobe airspace opacity is noted concerning for pneumonia. Follow-up radiographs are recommended to ensure resolution. Electronically Signed   By: Marijo Conception M.D.   On: 08/30/2019 10:51   CT Super D Chest Wo Contrast  Result Date: 08/29/2019 CLINICAL DATA:  Left apex lung mass. EXAM: CT CHEST WITHOUT CONTRAST TECHNIQUE: Multidetector CT imaging of the chest was performed using thin slice collimation for electromagnetic bronchoscopy planning purposes, without intravenous contrast. COMPARISON:  PET-CT 08/02/2019 FINDINGS: Cardiovascular: Normal heart size. No pericardial effusion. Aortic atherosclerosis. Left main and 3 vessel coronary artery calcifications. Mediastinum/Nodes: The FDG avid right lobe of thyroid gland nodule is less conspicuous on today's unenhanced study but measures approximately 1.6 cm, image 17/2. The trachea appears patent and is midline. Normal appearance of the esophagus. No enlarged supraclavicular or axillary lymph nodes. Right paratracheal lymph node measures 1.1 cm, image 57/2. Unchanged. Juxta esophageal lymph node just below the left mainstem bronchus measures 1.6 cm and appears unchanged, image 67/2. Subcarinal node measures 1 cm, image 75/2. Unchanged. Lungs/Pleura: No pleural effusion identified. Moderate changes of emphysema. FDG avid subpleural mass within the medial left lung apex containing fiducial markers or seed implants measures 3.6 x 1.8 cm, image 27/2. Unchanged. Right upper lobe lung nodule measures 8 mm, image 52/8. Previously this measured 0.7 cm.  FDG avid right hilar perihilar nodule versus lymph node is difficult to evaluate due to lack of IV contrast material. Subpleural nodule in the paraspinal right lower lobe measures 1.3 cm, image 122/8. Previously 1.2 cm. Resolution of previous left posterior costal phrenic sulcus nodule. Upper Abdomen: No acute abnormality noted within the imaged portions of the upper abdomen. Bilateral upper pole kidney cysts are incompletely characterized without IV contrast. Musculoskeletal: Spondylosis identified within the thoracic spine. No suspicious lytic or sclerotic bone lesions. T1 and T4 superior endplate deformities are identified. When compared with the exam from 12/25/2018 the fracture involving the superior endplate of T4 is new. IMPRESSION: 1. No significant change in the appearance of subpleural mass within the medial left lung apex containing fiducial markers or seed implants. 2.  Stable to minimal increased in size (by 1 mm) of right upper lobe and right lower lobe lung nodules. 3. Aortic atherosclerosis with left main and 3 vessel coronary artery calcifications noted. 4. T1 and T4 superior endplate deformities. When compared with the previous exam the fracture involving the superior endplate of T4 is new from the previous exam. 5. Again seen is FDG avid right lobe of thyroid gland nodule. In the setting of significant comorbidities or limited life expectancy, no follow-up recommended (ref: J Am Coll Radiol. 2015 Feb;12(2): 143-50). Aortic Atherosclerosis (ICD10-I70.0) and Emphysema (ICD10-J43.9). Electronically Signed   By: Kerby Moors M.D.   On: 08/29/2019 10:37   DG C-ARM BRONCHOSCOPY  Result Date: 08/30/2019 C-ARM BRONCHOSCOPY: Fluoroscopy was utilized by the requesting physician.  No radiographic interpretation.    ASSESSMENT AND PLAN: This is a very pleasant 84 years old white male with history of a stage Ia non-small cell lung cancer, adenocarcinoma involving the left upper lobe pulmonary nodule  diagnosed in February 2018 status post SBRT then followed by conventional radiotherapy for suspicious disease recurrence in the same area. The patient was found on recent PET scan to have suspicious right upper lobe nodule and right hilar adenopathy. He underwent bronchoscopy with endobronchial ultrasound and biopsy and the final pathology was negative for malignancy from all these areas. I recommended for the patient to continue on observation with repeat CT scan of the chest in 3 months. For the suspicious right upper lobe pneumonia, I will start the patient on Z-Pak. The patient was advised to call immediately if he has any concerning symptoms in the interval. The patient voices understanding of current disease status and treatment options and is in agreement with the current care plan.  All questions were answered. The patient knows to call the clinic with any problems, questions or concerns. We can certainly see the patient much sooner if necessary.  The total time spent in the appointment was 30 minutes.  Disclaimer: This note was dictated with voice recognition software. Similar sounding words can inadvertently be transcribed and may not be corrected upon review.

## 2019-09-07 ENCOUNTER — Other Ambulatory Visit: Payer: Self-pay | Admitting: Internal Medicine

## 2019-09-07 ENCOUNTER — Telehealth: Payer: Self-pay

## 2019-09-07 MED ORDER — AZITHROMYCIN 250 MG PO TABS
ORAL_TABLET | ORAL | 0 refills | Status: DC
Start: 2019-09-07 — End: 2019-09-18

## 2019-09-07 NOTE — Telephone Encounter (Signed)
Fax received this morning for this patient requesting an rx for a Zpack from [facility] in [place]. Patient called and wife explained patient was to start the abx yesterday from Dr. Julien Nordmann for pneumonia. She was advised this nurse will contact Dr. Worthy Flank office and request that the rx be resent. LVM for Dr. Candie Chroman nurse to f/u on this and contact the patient.

## 2019-09-13 ENCOUNTER — Other Ambulatory Visit: Payer: Self-pay

## 2019-09-13 ENCOUNTER — Emergency Department
Admission: EM | Admit: 2019-09-13 | Discharge: 2019-09-13 | Disposition: A | Discharge status: Home / Self Care | Payer: Medicare Other | Attending: Emergency Medicine | Admitting: Emergency Medicine

## 2019-09-13 ENCOUNTER — Encounter: Payer: Self-pay | Admitting: Emergency Medicine

## 2019-09-13 ENCOUNTER — Telehealth: Payer: Self-pay | Admitting: Medical Oncology

## 2019-09-13 DIAGNOSIS — Z955 Presence of coronary angioplasty implant and graft: Secondary | ICD-10-CM | POA: Diagnosis not present

## 2019-09-13 DIAGNOSIS — J449 Chronic obstructive pulmonary disease, unspecified: Secondary | ICD-10-CM | POA: Diagnosis not present

## 2019-09-13 DIAGNOSIS — I2510 Atherosclerotic heart disease of native coronary artery without angina pectoris: Secondary | ICD-10-CM | POA: Insufficient documentation

## 2019-09-13 DIAGNOSIS — Z7982 Long term (current) use of aspirin: Secondary | ICD-10-CM | POA: Diagnosis not present

## 2019-09-13 DIAGNOSIS — Z79899 Other long term (current) drug therapy: Secondary | ICD-10-CM | POA: Diagnosis not present

## 2019-09-13 DIAGNOSIS — Z85118 Personal history of other malignant neoplasm of bronchus and lung: Secondary | ICD-10-CM | POA: Diagnosis not present

## 2019-09-13 DIAGNOSIS — I10 Essential (primary) hypertension: Secondary | ICD-10-CM | POA: Diagnosis not present

## 2019-09-13 DIAGNOSIS — I252 Old myocardial infarction: Secondary | ICD-10-CM | POA: Insufficient documentation

## 2019-09-13 DIAGNOSIS — Z87891 Personal history of nicotine dependence: Secondary | ICD-10-CM | POA: Diagnosis not present

## 2019-09-13 DIAGNOSIS — L299 Pruritus, unspecified: Secondary | ICD-10-CM | POA: Insufficient documentation

## 2019-09-13 MED ORDER — HYDROXYZINE HCL 10 MG PO TABS
10.0000 mg | ORAL_TABLET | Freq: Four times a day (QID) | ORAL | 0 refills | Status: DC | PRN
Start: 2019-09-13 — End: 2020-09-29

## 2019-09-13 MED ORDER — HYDROXYZINE HCL 10 MG PO TABS
10.0000 mg | ORAL_TABLET | Freq: Once | ORAL | Status: AC
  Administered 2019-09-13: 10 mg via ORAL
  Filled 2019-09-13 (×2): qty 1

## 2019-09-13 NOTE — Telephone Encounter (Signed)
?  allergic reaction- Finished z pak 2 days ago -now itching all over. Denies SOB, chest pain , difficulty breathing.   Pt has a lot of allergies.   I instructed wife to take pt to ED now.

## 2019-09-13 NOTE — ED Provider Notes (Signed)
[facility] Emergency Department Provider Note  ____________________________________________   First MD Initiated Contact with Patient 09/13/19 1144     (approximate)  I have reviewed the triage vital signs and the nursing notes.   HISTORY  Chief Complaint Allergic Reaction   HPI [name] is a 84 y.o. male presents to the ED with complaint of possible allergic reaction to medication.  Patient states that he was started on a Z-Pak last Friday and began developing an itch on Monday and Tuesday.  Patient states he had no respiratory difficulties or and continue taking the Z-Pak until it was completely finished.  Patient has long history of multiple allergies with 20 allergies listed on his medical record.  Patient states that there is not 1 portion of his body that is not itching.  Patient reports that he normally has a hoarse voice as he is being treated for cancer at the cancer center and is part of the radiation therapy that he received.      Past Medical History:  Diagnosis Date  . AAA (abdominal aortic aneurysm) (HCC)    measures 2.7 cm.  . Arthritis   . Back pain   . BPH (benign prostatic hypertrophy)   . Bruises easily   . CAD (coronary artery disease)   . Cervical spine fracture ([place]) feb 1990   C 6, C 7 and T 1, no surgery done wore halo brace  . CLL (chronic lymphocytic leukemia) ([place])    hx of worked up 2009 by dr Benay Spice, no tx done, released by dr Benay Spice  . Complication of anesthesia    cannot lie on right side gets vertigo and trouble turning neck to right  . COPD (chronic obstructive pulmonary disease) ([place]) may 2005  . Erectile dysfunction   . Fatigue   . GERD (gastroesophageal reflux disease)   . Glaucoma   . Hepatitis as child   serum hepatitis  . History of oxygen administration    oxygen  @2   l/m nasally at bedtime.  Marland Kitchen History of radiation therapy 06/22/16-06/28/16   Left upper lobe of lung/ 54 Gy in 3 fractions  .  Hyperlipidemia   . Hypertension   . Impaired hearing    wears Hearing aids  . Ischemic cardiomyopathy   . Leg pain   . Lumbar disc disease   . Lung cancer ([place]) dx'd 03/18/16  . Microscopic hematuria   . Myocardial infarct ([place]) 03-17-2009   Hx MI 2000  . Neuropathy    both feet and legs  . Osteopenia   . Pneumonia    hx x2  . Thyroid nodule     Patient Active Problem List   Diagnosis Date Noted  . Lung mass 08/23/2019  . Recurrent cancer of left lung of unknown cell type ([place]) 02/07/2019  . Non-small cell carcinoma of left lung, stage 1 ([place]) 04/05/2016  . Carotid stenosis 10/19/2011  . Obstructive sleep apnea 09/13/2011  . ACS (acute coronary syndrome) ([place]) 08/13/2011  . COPD GOLD III with reversibility 08/11/2011  . Ischemic cardiomyopathy 05/04/2011  . AAA (abdominal aortic aneurysm) ([place]) 05/04/2011  . CAD (coronary artery disease) 11/04/2010  . Hyperlipidemia 11/04/2010  . Leg pain 11/04/2010    Past Surgical History:  Procedure Laterality Date  . APPENDECTOMY  sept, 10, 2005  . BACK SURGERY  2009   L 2 and L 3   . benign cyst removed from left neck  02-2010  . [place] SURGERY  2005-2008  C 2, C 3 and C 4 2005, rods placed C 6, C 7 and T 1  . COLONOSCOPY WITH PROPOFOL N/A 11/26/2014   Procedure: COLONOSCOPY WITH PROPOFOL;  Surgeon: Garlan Fair, MD;  Location: [facility];  Service: Endoscopy;  Laterality: N/A;  . CORONARY ANGIOPLASTY WITH STENT PLACEMENT  2013   1 stent repair, one stent replaced  . ESOPHAGOGASTRODUODENOSCOPY (EGD) WITH PROPOFOL N/A 11/27/2013   Procedure: ESOPHAGOGASTRODUODENOSCOPY (EGD) WITH PROPOFOL;  Surgeon: Garlan Fair, MD;  Location: [facility];  Service: Endoscopy;  Laterality: N/A;  . EYE SURGERY Bilateral july 2011   eye surgry for glaucoma  . FUDUCIAL PLACEMENT N/A 05/12/2016   Procedure: PLACEMENT OF FUDUCIAL;  Surgeon: Melrose Nakayama, MD;  Location: [place];  Service: Thoracic;  Laterality: N/A;  . fusion L 3  and L 4  August 15, 2008  . LEFT HEART CATHETERIZATION WITH CORONARY ANGIOGRAM N/A 08/12/2011   Procedure: LEFT HEART CATHETERIZATION WITH CORONARY ANGIOGRAM;  Surgeon: Larey Dresser, MD;  Location: [facility] CATH LAB;  Service: Cardiovascular;  Laterality: N/A;  . NASAL SINUS SURGERY   oct 2005  . right knee arthroscopy  1981  . stent top heart  2011   x 2  . TONSILLECTOMY  1943  . VIDEO BRONCHOSCOPY WITH ENDOBRONCHIAL NAVIGATION N/A 05/12/2016   Procedure: VIDEO BRONCHOSCOPY WITH ENDOBRONCHIAL NAVIGATION;  Surgeon: Melrose Nakayama, MD;  Location: [place];  Service: Thoracic;  Laterality: N/A;  . VIDEO BRONCHOSCOPY WITH ENDOBRONCHIAL NAVIGATION N/A 08/30/2019   Procedure: VIDEO BRONCHOSCOPY WITH ENDOBRONCHIAL NAVIGATION;  Surgeon: Melrose Nakayama, MD;  Location: [place];  Service: Thoracic;  Laterality: N/A;  . VIDEO BRONCHOSCOPY WITH ENDOBRONCHIAL ULTRASOUND N/A 08/30/2019   Procedure: VIDEO BRONCHOSCOPY WITH ENDOBRONCHIAL ULTRASOUND;  Surgeon: Melrose Nakayama, MD;  Location: [place];  Service: Thoracic;  Laterality: N/A;    Prior to Admission medications   Medication Sig Start Date End Date Taking? Authorizing Provider  albuterol (PROAIR HFA) 108 (90 BASE) MCG/ACT inhaler Inhale 2 puffs into the lungs every 6 (six) hours as needed for wheezing or shortness of breath.     [provider]  aspirin 81 MG tablet Take 81 mg by mouth daily.     [provider]  atorvastatin (LIPITOR) 20 MG tablet Take 20 mg by mouth daily.  01/31/18   [provider]  azithromycin (ZITHROMAX) 250 MG tablet Use as instructed. 09/07/19   Curt Bears, MD  CALCIUM-MAGNESIUM-VITAMIN D PO Take 1 tablet by mouth daily.    [provider]  gabapentin (NEURONTIN) 300 MG capsule Take 600 mg by mouth 2 (two) times daily.     [provider]  hydrOXYzine (ATARAX/VISTARIL) 10 MG tablet Take 1 tablet (10 mg total) by mouth every 6 (six) hours as needed for itching. 09/13/19    Johnn Hai, PA-C  latanoprost (XALATAN) 0.005 % ophthalmic solution Place 1 drop into both eyes at bedtime.  08/16/13   [provider]  metoprolol succinate (TOPROL-XL) 25 MG 24 hr tablet Take 25 mg by mouth every morning.     [provider]  nitroGLYCERIN (NITROSTAT) 0.4 MG SL tablet Place 0.4 mg under the tongue every 5 (five) minutes as needed for chest pain. For chest pain     [provider]  OXYGEN Inhale 2 L into the lungs at bedtime.     [provider]  sucralfate (CARAFATE) 1 g tablet Take 1 tablet (1 g total) by mouth 4 (four) times daily -  with meals and at bedtime. 30 mins prior to meals, crush and dissolve in 10 mL of warm water prior to swallowing Patient taking differently: Take 1 g by mouth 2 (two) times daily. 30 mins prior to meals, crush and dissolve in 10 mL of warm water prior to swallowing 06/25/19   Gery Pray, MD  Tamsulosin HCl (FLOMAX) 0.4 MG CAPS Take 0.4 mg by mouth every evening.     [provider]  timolol (TIMOPTIC) 0.5 % ophthalmic solution Place 1 drop into both eyes 2 (two) times daily.  04/09/19   [provider]    Allergies Entresto [sacubitril-valsartan], Iodinated diagnostic agents, Penicillins, Sulfonamide derivatives, Lumigan [bimatoprost], Lyrica [pregabalin], Symbicort [budesonide-formoterol fumarate], Valsartan, Advair diskus [fluticasone-salmeterol], Brimonidine tartrate, Ciprofloxacin, Flovent diskus [fluticasone propionate (inhal)], Pulmicort [budesonide], Qvar [beclomethasone], Spiriva [tiotropium bromide monohydrate], Tessalon [benzonatate], Bacitracin, Flonase [fluticasone propionate], Relafen [nabumetone], and Tape  Family History  Problem Relation Age of Onset  . Lung cancer Father 49       died.   . Alzheimer's disease Mother        died of natural causes  . Cancer Brother        jaw  . Renal cancer Brother   . Lung cancer Brother     Social History Social History    Tobacco Use  . Smoking status: Former Smoker    Packs/day: 2.00    Years: 55.00    Pack years: 110.00    Types: Cigarettes    Quit date: 07/14/2006    Years since quitting: 13.1  . Smokeless tobacco: Never Used  Vaping Use  . Vaping Use: Never used  Substance Use Topics  . Alcohol use: No  . Drug use: No    Review of Systems Constitutional: No fever/chills Eyes: No visual changes. ENT: No sore throat. Cardiovascular: Denies chest pain. Respiratory: Denies shortness of breath. Gastrointestinal: No abdominal pain.  No nausea, no vomiting.  Musculoskeletal: Negative for muscle aches. Skin: Negative for rash.  Positive for itching. Neurological: Negative for headaches, focal weakness or numbness.  ____________________________________________   PHYSICAL EXAM:  VITAL SIGNS: ED Triage Vitals  Enc Vitals Group     BP 09/13/19 1055 129/77     Pulse Rate 09/13/19 1055 89     Resp 09/13/19 1055 18     Temp 09/13/19 1055 99 F (37.2 C)     Temp Source 09/13/19 1055 Oral     SpO2 09/13/19 1055 96 %     Weight 09/13/19 1056 190 lb (86.2 kg)     Height 09/13/19 1056 5\' 11"  (1.803 m)     Head Circumference --      Peak Flow --      Pain Score 09/13/19 1109 0     Pain Loc --      Pain Edu? --      Excl. in [place]? --    Constitutional: Alert and oriented. Well appearing and in no acute distress. Eyes: Conjunctivae are normal.  Head: Atraumatic. Nose: No congestion/rhinnorhea. Mouth/Throat: Mucous membranes are moist.  Oropharynx non-erythematous. Neck: No stridor.   Cardiovascular: Normal rate, regular rhythm. Grossly normal heart sounds.  Good peripheral circulation. Respiratory: Normal respiratory effort.  No retractions. Lungs CTAB. Gastrointestinal: Soft and nontender. No distention. No abdominal bruits. No CVA tenderness. Musculoskeletal: No lower extremity tenderness nor edema.  No joint effusions. Neurologic:  Normal speech and language. No gross focal neurologic  deficits are appreciated. No gait instability. Skin:  Skin is warm, dry and intact.  No rash or discoloration is noted on the trunk or extremities. Psychiatric: Mood and affect are normal. Speech and behavior are normal.  ____________________________________________   LABS (all labs ordered are listed, but only abnormal results are displayed)  Labs Reviewed - No data to display  PROCEDURES  Procedure(s) performed (including Critical Care):  Procedures   ____________________________________________   INITIAL IMPRESSION / ASSESSMENT AND PLAN / ED COURSE  As part of my medical decision making, I reviewed the following data within the electronic MEDICAL RECORD NUMBER Notes from prior ED visits and [place] Controlled Substance Database  84 year old male presents to the ED with possible allergic reaction a Z-Pak.  Patient states that he was prescribed a Z-Pak on Friday and began itching on Monday or Tuesday.  He denies any respiratory problems, problems swallowing, shortness of breath or actual rash.  Patient states this is happened multiple times and currently has allergies to 19 different medications.  Patient was given Atarax 10 mg p.o. while in the ED.  Patient states he is not drowsy and is standing in the hallway stating that he is ready to go.  Patient was made aware that he has a prescription at his pharmacy for Atarax 10 mg every 6 hours as needed for itching.  He is to follow-up with his PCP if any continued problems or return to the emergency department if any worsening of his symptoms. ____________________________________________   FINAL CLINICAL IMPRESSION(S) / ED DIAGNOSES  Final diagnoses:  Pruritus     ED Discharge Orders         Ordered    hydrOXYzine (ATARAX/VISTARIL) 10 MG tablet  Every 6 hours PRN     Discontinue  Reprint     09/13/19 1246           Note:  This document was prepared using Dragon voice recognition software and may include unintentional dictation  errors.    Johnn Hai, PA-C 09/13/19 1525    Vanessa Rosepine, MD 09/14/19 [phone]

## 2019-09-13 NOTE — ED Triage Notes (Signed)
Pt in via POV, reports having allergic reaction to Zpac, states itching all over since Tuesday.  Pt has completed course of Zpac but continues with the itching.  No apparent swelling or hives noted at this time.  Pt denies any SOB, or difficulty swallowing.  NAD noted at this time.

## 2019-09-13 NOTE — Discharge Instructions (Signed)
Follow-up with your primary care provider if any continued problems.  Begin taking the Atarax every 6 hours as needed for itching.  If any severe worsening of your symptoms return to the emergency department.  Also let your primary care provider know that you had difficulty taking the Zithromax due to itching.

## 2019-09-13 NOTE — ED Notes (Signed)
See triage note  Presents with possible allergic rxn  States he was started on z-pak last Friday  Developed some itching on Monday and Tuesday   No other sx's  No rash

## 2019-09-15 ENCOUNTER — Inpatient Hospital Stay (HOSPITAL_COMMUNITY): Payer: Medicare Other

## 2019-09-15 ENCOUNTER — Inpatient Hospital Stay (HOSPITAL_COMMUNITY)
Admission: EM | Admit: 2019-09-15 | Discharge: 2019-09-18 | DRG: 177 | Disposition: A | Discharge status: Home / Self Care | Payer: Medicare Other | Attending: Internal Medicine | Admitting: Internal Medicine

## 2019-09-15 ENCOUNTER — Encounter (HOSPITAL_COMMUNITY): Payer: Self-pay | Admitting: Emergency Medicine

## 2019-09-15 ENCOUNTER — Emergency Department (HOSPITAL_COMMUNITY): Payer: Medicare Other

## 2019-09-15 ENCOUNTER — Other Ambulatory Visit: Payer: Self-pay

## 2019-09-15 DIAGNOSIS — J3800 Paralysis of vocal cords and larynx, unspecified: Secondary | ICD-10-CM | POA: Diagnosis present

## 2019-09-15 DIAGNOSIS — N400 Enlarged prostate without lower urinary tract symptoms: Secondary | ICD-10-CM | POA: Diagnosis present

## 2019-09-15 DIAGNOSIS — H9190 Unspecified hearing loss, unspecified ear: Secondary | ICD-10-CM | POA: Diagnosis present

## 2019-09-15 DIAGNOSIS — I5022 Chronic systolic (congestive) heart failure: Secondary | ICD-10-CM | POA: Diagnosis present

## 2019-09-15 DIAGNOSIS — Z974 Presence of external hearing-aid: Secondary | ICD-10-CM

## 2019-09-15 DIAGNOSIS — Z8051 Family history of malignant neoplasm of kidney: Secondary | ICD-10-CM

## 2019-09-15 DIAGNOSIS — H409 Unspecified glaucoma: Secondary | ICD-10-CM | POA: Diagnosis present

## 2019-09-15 DIAGNOSIS — K219 Gastro-esophageal reflux disease without esophagitis: Secondary | ICD-10-CM | POA: Diagnosis present

## 2019-09-15 DIAGNOSIS — J9621 Acute and chronic respiratory failure with hypoxia: Secondary | ICD-10-CM

## 2019-09-15 DIAGNOSIS — C3492 Malignant neoplasm of unspecified part of left bronchus or lung: Secondary | ICD-10-CM | POA: Diagnosis present

## 2019-09-15 DIAGNOSIS — Z981 Arthrodesis status: Secondary | ICD-10-CM

## 2019-09-15 DIAGNOSIS — J189 Pneumonia, unspecified organism: Secondary | ICD-10-CM | POA: Diagnosis present

## 2019-09-15 DIAGNOSIS — I110 Hypertensive heart disease with heart failure: Secondary | ICD-10-CM | POA: Diagnosis present

## 2019-09-15 DIAGNOSIS — J440 Chronic obstructive pulmonary disease with acute lower respiratory infection: Secondary | ICD-10-CM | POA: Diagnosis present

## 2019-09-15 DIAGNOSIS — I255 Ischemic cardiomyopathy: Secondary | ICD-10-CM | POA: Diagnosis present

## 2019-09-15 DIAGNOSIS — J690 Pneumonitis due to inhalation of food and vomit: Principal | ICD-10-CM

## 2019-09-15 DIAGNOSIS — Z85118 Personal history of other malignant neoplasm of bronchus and lung: Secondary | ICD-10-CM | POA: Diagnosis not present

## 2019-09-15 DIAGNOSIS — Z923 Personal history of irradiation: Secondary | ICD-10-CM | POA: Diagnosis not present

## 2019-09-15 DIAGNOSIS — R490 Dysphonia: Secondary | ICD-10-CM | POA: Diagnosis present

## 2019-09-15 DIAGNOSIS — Z881 Allergy status to other antibiotic agents status: Secondary | ICD-10-CM

## 2019-09-15 DIAGNOSIS — Z955 Presence of coronary angioplasty implant and graft: Secondary | ICD-10-CM

## 2019-09-15 DIAGNOSIS — I252 Old myocardial infarction: Secondary | ICD-10-CM

## 2019-09-15 DIAGNOSIS — Z20822 Contact with and (suspected) exposure to covid-19: Secondary | ICD-10-CM | POA: Diagnosis present

## 2019-09-15 DIAGNOSIS — I209 Angina pectoris, unspecified: Secondary | ICD-10-CM | POA: Diagnosis not present

## 2019-09-15 DIAGNOSIS — Z79899 Other long term (current) drug therapy: Secondary | ICD-10-CM

## 2019-09-15 DIAGNOSIS — G4730 Sleep apnea, unspecified: Secondary | ICD-10-CM | POA: Diagnosis present

## 2019-09-15 DIAGNOSIS — Z87891 Personal history of nicotine dependence: Secondary | ICD-10-CM

## 2019-09-15 DIAGNOSIS — I714 Abdominal aortic aneurysm, without rupture: Secondary | ICD-10-CM | POA: Diagnosis present

## 2019-09-15 DIAGNOSIS — E785 Hyperlipidemia, unspecified: Secondary | ICD-10-CM | POA: Diagnosis present

## 2019-09-15 DIAGNOSIS — Z888 Allergy status to other drugs, medicaments and biological substances status: Secondary | ICD-10-CM

## 2019-09-15 DIAGNOSIS — Z880 Allergy status to penicillin: Secondary | ICD-10-CM

## 2019-09-15 DIAGNOSIS — Z801 Family history of malignant neoplasm of trachea, bronchus and lung: Secondary | ICD-10-CM

## 2019-09-15 DIAGNOSIS — Z882 Allergy status to sulfonamides status: Secondary | ICD-10-CM

## 2019-09-15 DIAGNOSIS — Z7982 Long term (current) use of aspirin: Secondary | ICD-10-CM

## 2019-09-15 DIAGNOSIS — I25118 Atherosclerotic heart disease of native coronary artery with other forms of angina pectoris: Secondary | ICD-10-CM | POA: Diagnosis present

## 2019-09-15 DIAGNOSIS — Z856 Personal history of leukemia: Secondary | ICD-10-CM

## 2019-09-15 DIAGNOSIS — Z820 Family history of epilepsy and other diseases of the nervous system: Secondary | ICD-10-CM

## 2019-09-15 LAB — BRAIN NATRIURETIC PEPTIDE: B Natriuretic Peptide: 32.4 pg/mL (ref 0.0–100.0)

## 2019-09-15 LAB — BASIC METABOLIC PANEL
Anion gap: 12 (ref 5–15)
BUN: 12 mg/dL (ref 8–23)
CO2: 29 mmol/L (ref 22–32)
Calcium: 9.2 mg/dL (ref 8.9–10.3)
Chloride: 99 mmol/L (ref 98–111)
Creatinine, Ser: 1 mg/dL (ref 0.61–1.24)
GFR calc Af Amer: >60 mL/min (ref >60)
GFR calc non Af Amer: >60 mL/min (ref >60)
Glucose, Bld: 173 mg/dL — ABNORMAL HIGH (ref 70–99)
Potassium: 4.6 mmol/L (ref 3.5–5.1)
Sodium: 140 mmol/L (ref 135–145)

## 2019-09-15 LAB — TROPONIN I (HIGH SENSITIVITY)
Troponin I (High Sensitivity): 4 ng/L (ref <18)
Troponin I (High Sensitivity): 4 ng/L (ref <18)

## 2019-09-15 LAB — CBC
HCT: 41.3 % (ref 39.0–52.0)
Hemoglobin: 12.8 g/dL — ABNORMAL LOW (ref 13.0–17.0)
MCH: 29 pg (ref 26.0–34.0)
MCHC: 31 g/dL (ref 30.0–36.0)
MCV: 93.4 fL (ref 80.0–100.0)
Platelets: 198 10*3/uL (ref 150–400)
RBC: 4.42 MIL/uL (ref 4.22–5.81)
RDW: 13.9 % (ref 11.5–15.5)
WBC: 15 10*3/uL — ABNORMAL HIGH (ref 4.0–10.5)
nRBC: 0 % (ref 0.0–0.2)

## 2019-09-15 LAB — SARS CORONAVIRUS 2 BY RT PCR (HOSPITAL ORDER, PERFORMED IN ~~LOC~~ HOSPITAL LAB): SARS Coronavirus 2: NEGATIVE

## 2019-09-15 LAB — ECHOCARDIOGRAM COMPLETE
Height: 69 in
Weight: 3040 oz

## 2019-09-15 LAB — LACTIC ACID, PLASMA: Lactic Acid, Venous: 1.6 mmol/L (ref 0.5–1.9)

## 2019-09-15 MED ORDER — SODIUM CHLORIDE 0.9 % IV SOLN
1.0000 g | INTRAVENOUS | Status: DC
  Administered 2019-09-15: 1 g via INTRAVENOUS
  Filled 2019-09-15: qty 10

## 2019-09-15 MED ORDER — METRONIDAZOLE 500 MG PO TABS
500.0000 mg | ORAL_TABLET | Freq: Three times a day (TID) | ORAL | Status: DC
  Administered 2019-09-15 – 2019-09-18 (×9): 500 mg via ORAL
  Filled 2019-09-15 (×9): qty 1

## 2019-09-15 MED ORDER — ASPIRIN EC 81 MG PO TBEC
81.0000 mg | DELAYED_RELEASE_TABLET | Freq: Every day | ORAL | Status: DC
  Administered 2019-09-15 – 2019-09-18 (×4): 81 mg via ORAL
  Filled 2019-09-15 (×4): qty 1

## 2019-09-15 MED ORDER — GABAPENTIN 300 MG PO CAPS
600.0000 mg | ORAL_CAPSULE | Freq: Three times a day (TID) | ORAL | Status: DC
  Administered 2019-09-15 – 2019-09-18 (×10): 600 mg via ORAL
  Filled 2019-09-15 (×10): qty 2

## 2019-09-15 MED ORDER — TAMSULOSIN HCL 0.4 MG PO CAPS
0.4000 mg | ORAL_CAPSULE | Freq: Every evening | ORAL | Status: DC
  Administered 2019-09-15 – 2019-09-17 (×3): 0.4 mg via ORAL
  Filled 2019-09-15 (×3): qty 1

## 2019-09-15 MED ORDER — METOPROLOL SUCCINATE ER 25 MG PO TB24
25.0000 mg | ORAL_TABLET | Freq: Every morning | ORAL | Status: DC
  Administered 2019-09-15 – 2019-09-17 (×3): 25 mg via ORAL
  Filled 2019-09-15 (×3): qty 1

## 2019-09-15 MED ORDER — LATANOPROST 0.005 % OP SOLN
1.0000 [drp] | Freq: Every day | OPHTHALMIC | Status: DC
  Administered 2019-09-15 – 2019-09-17 (×3): 1 [drp] via OPHTHALMIC
  Filled 2019-09-15: qty 2.5

## 2019-09-15 MED ORDER — ENOXAPARIN SODIUM 40 MG/0.4ML ~~LOC~~ SOLN
40.0000 mg | SUBCUTANEOUS | Status: DC
  Administered 2019-09-15 – 2019-09-18 (×4): 40 mg via SUBCUTANEOUS
  Filled 2019-09-15 (×4): qty 0.4

## 2019-09-15 MED ORDER — SUCRALFATE 1 G PO TABS
1.0000 g | ORAL_TABLET | Freq: Four times a day (QID) | ORAL | Status: DC
  Administered 2019-09-15 – 2019-09-18 (×12): 1 g via ORAL
  Filled 2019-09-15 (×12): qty 1

## 2019-09-15 MED ORDER — SODIUM CHLORIDE 0.9 % IV SOLN
1.0000 g | Freq: Once | INTRAVENOUS | Status: AC
  Administered 2019-09-15: 1 g via INTRAVENOUS
  Filled 2019-09-15: qty 1

## 2019-09-15 MED ORDER — TIMOLOL MALEATE 0.5 % OP SOLN
1.0000 [drp] | Freq: Two times a day (BID) | OPHTHALMIC | Status: DC
  Administered 2019-09-15 – 2019-09-18 (×7): 1 [drp] via OPHTHALMIC
  Filled 2019-09-15: qty 5

## 2019-09-15 MED ORDER — GUAIFENESIN 100 MG/5ML PO SOLN
5.0000 mL | ORAL | Status: DC | PRN
  Filled 2019-09-15: qty 5

## 2019-09-15 MED ORDER — HYDROXYZINE HCL 10 MG PO TABS
10.0000 mg | ORAL_TABLET | Freq: Four times a day (QID) | ORAL | Status: DC | PRN
  Filled 2019-09-15: qty 1

## 2019-09-15 MED ORDER — ACETAMINOPHEN 325 MG PO TABS
650.0000 mg | ORAL_TABLET | Freq: Four times a day (QID) | ORAL | Status: DC | PRN
  Administered 2019-09-17: 650 mg via ORAL
  Filled 2019-09-15: qty 2

## 2019-09-15 MED ORDER — ALBUTEROL SULFATE (2.5 MG/3ML) 0.083% IN NEBU: 2.5000 mg | INHALATION_SOLUTION | Freq: Four times a day (QID) | RESPIRATORY_TRACT | Status: DC | PRN

## 2019-09-15 MED ORDER — SODIUM CHLORIDE 0.9% FLUSH
3.0000 mL | Freq: Once | INTRAVENOUS | Status: AC
  Administered 2019-09-15: 3 mL via INTRAVENOUS

## 2019-09-15 MED ORDER — CLOPIDOGREL BISULFATE 75 MG PO TABS
75.0000 mg | ORAL_TABLET | Freq: Every day | ORAL | Status: DC
  Administered 2019-09-15 – 2019-09-18 (×4): 75 mg via ORAL
  Filled 2019-09-15 (×4): qty 1

## 2019-09-15 MED ORDER — SODIUM CHLORIDE 0.9 % IV SOLN
2.0000 g | INTRAVENOUS | Status: DC
  Administered 2019-09-15 – 2019-09-17 (×3): 2 g via INTRAVENOUS
  Filled 2019-09-15: qty 20
  Filled 2019-09-15: qty 0.1
  Filled 2019-09-15: qty 20

## 2019-09-15 MED ORDER — ACETAMINOPHEN 650 MG RE SUPP: 650.0000 mg | Freq: Four times a day (QID) | RECTAL | Status: DC | PRN

## 2019-09-15 MED ORDER — ATORVASTATIN CALCIUM 10 MG PO TABS
20.0000 mg | ORAL_TABLET | Freq: Every day | ORAL | Status: DC
  Administered 2019-09-15 – 2019-09-18 (×4): 20 mg via ORAL
  Filled 2019-09-15 (×4): qty 2

## 2019-09-15 NOTE — ED Provider Notes (Signed)
[facility] Provider Note   CSN: [id number] Arrival date & time: 09/15/19  0720     History Chief Complaint  Patient presents with  . Chest Pain  . Shortness of Breath    [name] is a 84 y.o. male.  HPI Patient with history of CAD as well as lung cancer s/p radiation with COPD who wears oxygen at home at night and PRN has had increasing SOB and chest pain for the last 4 days. He was recently seen at his oncologist's office and given a Zpak for a productive cough of yellow sputum. Seen in the [facility] ED 2 days ago for itching several days after completing his Zpak, no mention of SOB or CP at that visit. He reports a mild episode of chest pain yesterday and then another this morning he describes as sharp, right sided radiating into neck. HE reports he woke up this morning with low SpO2 and had to increase his oxygen. He also states he has chronic cough/throat clearing and often gets liquids 'down the wrong pipe'. Denies fevers.      Past Medical History:  Diagnosis Date  . AAA (abdominal aortic aneurysm) (HCC)    measures 2.7 cm.  . Arthritis   . Back pain   . BPH (benign prostatic hypertrophy)   . Bruises easily   . CAD (coronary artery disease)   . Cervical spine fracture ([place]) feb 1990   C 6, C 7 and T 1, no surgery done wore halo brace  . CLL (chronic lymphocytic leukemia) ([place])    hx of worked up 2009 by dr Benay Spice, no tx done, released by dr Benay Spice  . Complication of anesthesia    cannot lie on right side gets vertigo and trouble turning neck to right  . COPD (chronic obstructive pulmonary disease) ([place]) may 2005  . Erectile dysfunction   . Fatigue   . GERD (gastroesophageal reflux disease)   . Glaucoma   . Hepatitis as child   serum hepatitis  . History of oxygen administration    oxygen  @2   l/m nasally at bedtime.  Marland Kitchen History of radiation therapy 06/22/16-06/28/16   Left upper lobe of lung/ 54 Gy in 3 fractions  .  Hyperlipidemia   . Hypertension   . Impaired hearing    wears Hearing aids  . Ischemic cardiomyopathy   . Leg pain   . Lumbar disc disease   . Lung cancer ([place]) dx'd 03/18/16  . Microscopic hematuria   . Myocardial infarct ([place]) 03-17-2009   Hx MI 2000  . Neuropathy    both feet and legs  . Osteopenia   . Pneumonia    hx x2  . Thyroid nodule     Patient Active Problem List   Diagnosis Date Noted  . Lung mass 08/23/2019  . Recurrent cancer of left lung of unknown cell type ([place]) 02/07/2019  . Non-small cell carcinoma of left lung, stage 1 ([place]) 04/05/2016  . Carotid stenosis 10/19/2011  . Obstructive sleep apnea 09/13/2011  . ACS (acute coronary syndrome) ([place]) 08/13/2011  . COPD GOLD III with reversibility 08/11/2011  . Ischemic cardiomyopathy 05/04/2011  . AAA (abdominal aortic aneurysm) ([place]) 05/04/2011  . CAD (coronary artery disease) 11/04/2010  . Hyperlipidemia 11/04/2010  . Leg pain 11/04/2010    Past Surgical History:  Procedure Laterality Date  . APPENDECTOMY  sept, 10, 2005  . BACK SURGERY  2009   L 2 and L 3   .  benign cyst removed from left neck  02-2010  . CERVICAL DISC SURGERY  2005-2008   C 2, C 3 and C 4 2005, rods placed C 6, C 7 and T 1  . COLONOSCOPY WITH PROPOFOL N/A 11/26/2014   Procedure: COLONOSCOPY WITH PROPOFOL;  Surgeon: Garlan Fair, MD;  Location: [facility] ENDOSCOPY;  Service: Endoscopy;  Laterality: N/A;  . CORONARY ANGIOPLASTY WITH STENT PLACEMENT  2013   1 stent repair, one stent replaced  . ESOPHAGOGASTRODUODENOSCOPY (EGD) WITH PROPOFOL N/A 11/27/2013   Procedure: ESOPHAGOGASTRODUODENOSCOPY (EGD) WITH PROPOFOL;  Surgeon: Garlan Fair, MD;  Location: [facility] END[facility];  Service: Endoscopy;  Laterality: N/A;  . EYE SURGERY Bilateral july 2011   eye surgry for glaucoma  . FUDUCIAL PLACEMENT N/A 05/12/2016   Procedure: PLACEMENT OF FUDUCIAL;  Surgeon: Melrose Nakayama, MD;  Location: [facility];  Service: Thoracic;  Laterality: N/A;  . fusion L 3  and L 4  August 15, 2008  . LEFT HEART CATHETERIZATION WITH CORONARY ANGIOGRAM N/A 08/12/2011   Procedure: LEFT HEART CATHETERIZATION WITH CORONARY ANGIOGRAM;  Surgeon: Larey Dresser, MD;  Location: [facility] CATH LAB;  Service: Cardiovascular;  Laterality: N/A;  . NASAL SINUS SURGERY   oct 2005  . right knee arthroscopy  1981  . stent top heart  2011   x 2  . TONSILLECTOMY  1943  . VIDEO BRONCHOSCOPY WITH ENDOBRONCHIAL NAVIGATION N/A 05/12/2016   Procedure: VIDEO BRONCHOSCOPY WITH ENDOBRONCHIAL NAVIGATION;  Surgeon: Melrose Nakayama, MD;  Location: [facility];  Service: Thoracic;  Laterality: N/A;  . VIDEO BRONCHOSCOPY WITH ENDOBRONCHIAL NAVIGATION N/A 08/30/2019   Procedure: VIDEO BRONCHOSCOPY WITH ENDOBRONCHIAL NAVIGATION;  Surgeon: Melrose Nakayama, MD;  Location: [facility];  Service: Thoracic;  Laterality: N/A;  . VIDEO BRONCHOSCOPY WITH ENDOBRONCHIAL ULTRASOUND N/A 08/30/2019   Procedure: VIDEO BRONCHOSCOPY WITH ENDOBRONCHIAL ULTRASOUND;  Surgeon: Melrose Nakayama, MD;  Location: [facility] OR;  Service: Thoracic;  Laterality: N/A;       Family History  Problem Relation Age of Onset  . Lung cancer Father 71       died.   . Alzheimer's disease Mother        died of natural causes  . Cancer Brother        jaw  . Renal cancer Brother   . Lung cancer Brother     Social History   Tobacco Use  . Smoking status: Former Smoker    Packs/day: 2.00    Years: 55.00    Pack years: 110.00    Types: Cigarettes    Quit date: 07/14/2006    Years since quitting: 13.1  . Smokeless tobacco: Never Used  Vaping Use  . Vaping Use: Never used  Substance Use Topics  . Alcohol use: No  . Drug use: No    Home Medications Prior to Admission medications   Medication Sig Start Date End Date Taking? Authorizing Provider  albuterol (PROAIR HFA) 108 (90 BASE) MCG/ACT inhaler Inhale 2 puffs into the lungs every 6 (six) hours as needed for wheezing or shortness of breath.    Yes [provider]    aspirin 81 MG tablet Take 81 mg by mouth daily.    Yes [provider]  atorvastatin (LIPITOR) 20 MG tablet Take 20 mg by mouth daily.  01/31/18  Yes [provider]  CALCIUM-MAGNESIUM-VITAMIN D PO Take 1 tablet by mouth daily.   Yes [provider]  gabapentin (NEURONTIN) 300 MG capsule Take 600 mg by mouth 3 (three)  times daily.    Yes [provider]  hydrOXYzine (ATARAX/VISTARIL) 10 MG tablet Take 1 tablet (10 mg total) by mouth every 6 (six) hours as needed for itching. 09/13/19  Yes Letitia Neri L, PA-C  latanoprost (XALATAN) 0.005 % ophthalmic solution Place 1 drop into both eyes at bedtime.  08/16/13  Yes [provider]  metoprolol succinate (TOPROL-XL) 25 MG 24 hr tablet Take 25 mg by mouth every morning.    Yes [provider]  nitroGLYCERIN (NITROSTAT) 0.4 MG SL tablet Place 0.4 mg under the tongue every 5 (five) minutes as needed for chest pain. For chest pain    Yes [provider]  OXYGEN Inhale 2 L into the lungs at bedtime.    Yes [provider]  sucralfate (CARAFATE) 1 g tablet Take 1 tablet (1 g total) by mouth 4 (four) times daily -  with meals and at bedtime. 30 mins prior to meals, crush and dissolve in 10 mL of warm water prior to swallowing Patient taking differently: Take 1 g by mouth 4 (four) times daily. 30 mins prior to meals, crush and dissolve in 10 mL of warm water prior to swallowing 06/25/19  Yes Gery Pray, MD  Tamsulosin HCl (FLOMAX) 0.4 MG CAPS Take 0.4 mg by mouth every evening.    Yes [provider]  timolol (TIMOPTIC) 0.5 % ophthalmic solution Place 1 drop into both eyes 2 (two) times daily.  04/09/19  Yes [provider]  azithromycin (ZITHROMAX) 250 MG tablet Use as instructed. Patient not taking: Reported on 09/15/2019 09/07/19   Curt Bears, MD    Allergies    Delene Loll [sacubitril-valsartan], Iodinated diagnostic agents, Penicillins, Sulfonamide derivatives,  Lumigan [bimatoprost], Lyrica [pregabalin], Symbicort [budesonide-formoterol fumarate], Valsartan, Advair diskus [fluticasone-salmeterol], Azithromycin, Brimonidine tartrate, Ciprofloxacin, Flovent diskus [fluticasone propionate (inhal)], Pulmicort [budesonide], Qvar [beclomethasone], Spiriva [tiotropium bromide monohydrate], Tessalon [benzonatate], Bacitracin, Flonase [fluticasone propionate], Relafen [nabumetone], and Tape  Review of Systems   Review of Systems A comprehensive review of systems was completed and negative except as noted in HPI.   Physical Exam Updated Vital Signs BP 127/79 (BP Location: Right Arm)   Pulse (!) 102   Temp 99.2 F (37.3 C) (Oral)   Resp 18   Ht 5\' 9"  (1.753 m)   Wt 86.2 kg   SpO2 92%   BMI 28.06 kg/m   Physical Exam Vitals and nursing note reviewed.  Constitutional:      Appearance: Normal appearance.  HENT:     Head: Normocephalic and atraumatic.     Nose: Nose normal.     Mouth/Throat:     Mouth: Mucous membranes are moist.  Eyes:     Extraocular Movements: Extraocular movements intact.     Conjunctiva/sclera: Conjunctivae normal.  Cardiovascular:     Rate and Rhythm: Normal rate.  Pulmonary:     Effort: Pulmonary effort is normal.     Breath sounds: Rhonchi present. No wheezing.  Abdominal:     General: Abdomen is flat.     Palpations: Abdomen is soft.     Tenderness: There is no abdominal tenderness.  Musculoskeletal:        General: No swelling. Normal range of motion.     Cervical back: Neck supple.  Skin:    General: Skin is warm and dry.  Neurological:     General: No focal deficit present.     Mental Status: He is alert.  Psychiatric:        Mood and Affect: Mood normal.  ED Results / Procedures / Treatments   Labs (all labs ordered are listed, but only abnormal results are displayed) Labs Reviewed  BASIC METABOLIC PANEL - Abnormal; Notable for the following components:      Result Value   Glucose, Bld 173 (*)     All other components within normal limits  CBC - Abnormal; Notable for the following components:   WBC 15.0 (*)    Hemoglobin 12.8 (*)    All other components within normal limits  SARS CORONAVIRUS 2 BY RT PCR (HOSPITAL ORDER, [facility])  CULTURE, BLOOD (ROUTINE X 2)  CULTURE, BLOOD (ROUTINE X 2)  BRAIN NATRIURETIC PEPTIDE  LACTIC ACID, PLASMA  LACTIC ACID, PLASMA  TROPONIN I (HIGH SENSITIVITY)  TROPONIN I (HIGH SENSITIVITY)    EKG EKG Interpretation  Date/Time:  Saturday September 15 2019 07:30:51 EDT Ventricular Rate:  98 PR Interval:  142 QRS Duration: 70 QT Interval:  338 QTC Calculation: 431 R Axis:   38 Text Interpretation: Normal sinus rhythm Normal ECG No significant change since last tracing Confirmed by Calvert Cantor [phone]) on 09/15/2019 7:41:00 AM   Radiology DG Chest 2 View  Result Date: 09/15/2019 CLINICAL DATA:  Chest pain and shortness of breath 4 days. Recent pneumonia. EXAM: CHEST - 2 VIEW COMPARISON:  08/30/2019 FINDINGS: Lungs are somewhat hypoinflated demonstrate interval worsening bilateral patchy hazy airspace process over the mid to lower lungs likely multifocal pneumonia. No evidence of effusion. Cardiomediastinal silhouette and remainder of the exam is unchanged. IMPRESSION: Hypoinflation with interval worsening bilateral patchy airspace process over the mid to lower lungs likely multifocal pneumonia. Electronically Signed   By: Marin Olp M.D.   On: 09/15/2019 08:19    Procedures Procedures (including critical care time)  Medications Ordered in ED Medications  sodium chloride flush (NS) 0.9 % injection 3 mL (has no administration in time range)  meropenem (MERREM) 1 g in sodium chloride 0.9 % 100 mL IVPB (has no administration in time range)    ED Course  I have reviewed the triage vital signs and the nursing notes.  Pertinent labs & imaging results that were available during my care of the patient were reviewed by me  and considered in my medical decision making (see chart for details).  Clinical Course as of Sep 14 1005  Sat Sep 15, 2019  0825 CBC with moderate leukocytosis. CXR images and results reviewed, worsening infiltrates. Possible aspiration given history of dysphagia. Will add blood cultures, Covid test. LActic acid and initiate Abx.    [CS]  [phone] Spoke with Pharmacist who recommends Meropenem for CAP plus aspiration coverage given patient's numerous allergies.    [CS]  0915 BMP, Trop and BNP are normal.    [CS]  0937 Lactic acid is normal. Will discuss admission with Hospitalists.    [CS]  37 Spoke with Dr. Roosevelt Locks, Hospitalist who will evaluate for admission.    [CS]    Clinical Course User Index [CS] Truddie Hidden, MD   MDM Rules/Calculators/A&P                          Patient with and episode of chest pain that he states was like previous MI, but noted to be right sided this time. Also having SOB, hypoxia and cough. Recently treated for CAP with Zpak from Oncology.  EKG is normal. Will check labs and CXR.  Final Clinical Impression(s) / ED Diagnoses Final diagnoses:  Aspiration pneumonia  of both lungs, unspecified aspiration pneumonia type, unspecified part of lung ([place])  Acute on chronic respiratory failure with hypoxia [facility])    Rx / DC Orders ED Discharge Orders    None       Truddie Hidden, MD 09/15/19 1007

## 2019-09-15 NOTE — Procedures (Signed)
Patient declined the use of CPAP.  Patient states that he only uses oxygen at home.

## 2019-09-15 NOTE — ED Triage Notes (Addendum)
Pt reports chest pain and sob x4 days, wears O2 at night and PRN during the day normally, has been wearing 2-3L 24/7 for the past few days due to low O2 sat. Reports he was recently diagnosed with pna, and finished zpac. O2 sat 84% on room air in triage, placed on 2L with improvement to 89%, O2 increased to 4L, 93% at this time.

## 2019-09-15 NOTE — Consult Note (Signed)
CARDIOLOGY CONSULT NOTE  Patient ID: [name] MRN: [id number] DOB/AGE: 1935-12-09 84 y.o.  Admit date: 09/15/2019 Referring Physician  Wynetta Fines, MD Primary Physician:  Wenda Low, MD Reason for Consultation  Angina pectoris  Patient ID: [name], male    DOB: 22-Nov-1935, 84 y.o.   MRN: [id number]  Chief Complaint  Patient presents with  . Chest Pain  . Shortness of Breath   HPI:    [name]  is a 84 y.o. Caucasian male with history of non-STEMI and PCI with DES to RCA in 2011 and circumflex stenting in 2013, ischemic cardiomyopathy with mildly reduced LVEF around 45% by echocardiogram on January 2018, hypertension and hyperlipidemia, asymptomatic right carotid stenosis, small 3.3 cm stable AAA since 1980s and followed by his PCP, severe COPD from prior tobacco use and presently on nocturnal and also uses oxygen when he is exerting. He has history of sleep apnea unable to tolerate CPAP multiple medication allergies, chronic back pain and spinal fusion in the past. History of lung cancer status post radiation therapy and follows Dr. Gery Pray since February 2018.  Last night he had an episode of chest pain felt like angina but it resolved immediately within a few minutes spontaneously.  This morning he woke up with severe chest pain with radiation to his jaw very similar to his angina when he had myocardial infarction in the past, this scared him and he called EMS, he received sublingual nitroglycerin, presented to the emergency room and had noticed that his chest pain had completely resolved by the time he got into the EMS.  He was found to be hypoxemic and x-ray revealed multilobar pneumonia.  I was asked to see the patient regarding his angina pectoris.  Presently except for chronic dyspnea and cough, no other specific symptoms.  States that he took Z-Pak for 5 days and has developed itching and he thinks he may be allergic to the medication.  Past Medical History:   Diagnosis Date  . AAA (abdominal aortic aneurysm) (HCC)    measures 2.7 cm.  . Arthritis   . Back pain   . BPH (benign prostatic hypertrophy)   . Bruises easily   . CAD (coronary artery disease)   . Cervical spine fracture ([place]) feb 1990   C 6, C 7 and T 1, no surgery done wore halo brace  . CLL (chronic lymphocytic leukemia) ([place])    hx of worked up 2009 by dr Benay Spice, no tx done, released by dr Benay Spice  . Complication of anesthesia    cannot lie on right side gets vertigo and trouble turning neck to right  . COPD (chronic obstructive pulmonary disease) ([place]) may 2005  . Erectile dysfunction   . Fatigue   . GERD (gastroesophageal reflux disease)   . Glaucoma   . Hepatitis as child   serum hepatitis  . History of oxygen administration    oxygen  @2   l/m nasally at bedtime.  Marland Kitchen History of radiation therapy 06/22/16-06/28/16   Left upper lobe of lung/ 54 Gy in 3 fractions  . Hyperlipidemia   . Hypertension   . Impaired hearing    wears Hearing aids  . Ischemic cardiomyopathy   . Leg pain   . Lumbar disc disease   . Lung cancer ([place]) dx'd 03/18/16  . Microscopic hematuria   . Myocardial infarct ([place]) 03-17-2009   Hx MI 2000  . Neuropathy    both feet and legs  . Osteopenia   .  Pneumonia    hx x2  . Thyroid nodule    Past Surgical History:  Procedure Laterality Date  . APPENDECTOMY  sept, 10, 2005  . BACK SURGERY  2009   L 2 and L 3   . benign cyst removed from left neck  02-2010  . CERVICAL DISC SURGERY  2005-2008   C 2, C 3 and C 4 2005, rods placed C 6, C 7 and T 1  . COLONOSCOPY WITH PROPOFOL N/A 11/26/2014   Procedure: COLONOSCOPY WITH PROPOFOL;  Surgeon: Garlan Fair, MD;  Location: [facility];  Service: Endoscopy;  Laterality: N/A;  . CORONARY ANGIOPLASTY WITH STENT PLACEMENT  2013   1 stent repair, one stent replaced  . ESOPHAGOGASTRODUODENOSCOPY (EGD) WITH PROPOFOL N/A 11/27/2013   Procedure: ESOPHAGOGASTRODUODENOSCOPY (EGD) WITH PROPOFOL;  Surgeon:  Garlan Fair, MD;  Location: [facility];  Service: Endoscopy;  Laterality: N/A;  . EYE SURGERY Bilateral july 2011   eye surgry for glaucoma  . FUDUCIAL PLACEMENT N/A 05/12/2016   Procedure: PLACEMENT OF FUDUCIAL;  Surgeon: Melrose Nakayama, MD;  Location: [place] [facility];  Service: Thoracic;  Laterality: N/A;  . fusion L 3 and L 4  August 15, 2008  . LEFT HEART CATHETERIZATION WITH CORONARY ANGIOGRAM N/A 08/12/2011   Procedure: LEFT HEART CATHETERIZATION WITH CORONARY ANGIOGRAM;  Surgeon: Larey Dresser, MD;  Location: [facility];  Service: Cardiovascular;  Laterality: N/A;  . NASAL SINUS SURGERY   oct 2005  . right knee arthroscopy  1981  . stent top heart  2011   x 2  . TONSILLECTOMY  1943  . VIDEO BRONCHOSCOPY WITH ENDOBRONCHIAL NAVIGATION N/A 05/12/2016   Procedure: VIDEO BRONCHOSCOPY WITH ENDOBRONCHIAL NAVIGATION;  Surgeon: Melrose Nakayama, MD;  Location: [place];  Service: Thoracic;  Laterality: N/A;  . VIDEO BRONCHOSCOPY WITH ENDOBRONCHIAL NAVIGATION N/A 08/30/2019   Procedure: VIDEO BRONCHOSCOPY WITH ENDOBRONCHIAL NAVIGATION;  Surgeon: Melrose Nakayama, MD;  Location: [place];  Service: Thoracic;  Laterality: N/A;  . VIDEO BRONCHOSCOPY WITH ENDOBRONCHIAL ULTRASOUND N/A 08/30/2019   Procedure: VIDEO BRONCHOSCOPY WITH ENDOBRONCHIAL ULTRASOUND;  Surgeon: Melrose Nakayama, MD;  Location: [place];  Service: Thoracic;  Laterality: N/A;   Social History   Socioeconomic History  . Marital status: Married    Spouse name: Not on file  . Number of children: 1  . Years of education: Not on file  . Highest education level: Not on file  Occupational History  . Not on file  Tobacco Use  . Smoking status: Former Smoker    Packs/day: 2.00    Years: 55.00    Pack years: 110.00    Types: Cigarettes    Quit date: 07/14/2006    Years since quitting: 13.1  . Smokeless tobacco: Never Used  Vaping Use  . Vaping Use: Never used  Substance and Sexual Activity  . Alcohol use: No  . Drug  use: No  . Sexual activity: Not on file  Other Topics Concern  . Not on file  Social History Narrative  . Not on file   Social Determinants of Health   Financial Resource Strain:   . Difficulty of Paying Living Expenses:   Food Insecurity:   . Worried About Charity fundraiser in the Last Year:   . Arboriculturist in the Last Year:   Transportation Needs:   . Film/video editor (Medical):   Marland Kitchen Lack of Transportation (Non-Medical):   Physical Activity:   . Days of Exercise  per Week:   . Minutes of Exercise per Session:   Stress:   . Feeling of Stress :   Social Connections:   . Frequency of Communication with Friends and Family:   . Frequency of Social Gatherings with Friends and Family:   . Attends Religious Services:   . Active Member of Clubs or Organizations:   . Attends Archivist Meetings:   Marland Kitchen Marital Status:   Intimate Partner Violence:   . Fear of Current or Ex-Partner:   . Emotionally Abused:   Marland Kitchen Physically Abused:   . Sexually Abused:    ROS  Review of Systems  Cardiovascular: Positive for chest pain and dyspnea on exertion. Negative for leg swelling.  Respiratory: Positive for cough and wheezing.   Gastrointestinal: Negative for melena.  All other systems reviewed and are negative.  Objective   Vitals with BMI 09/15/2019 09/15/2019 09/15/2019  Height - - -  Weight - - -  BMI - - -  Systolic 798 921 194  Diastolic 72 71 76  Pulse 96 99 101    Blood pressure 111/72, pulse 96, temperature 99.2 F (37.3 C), temperature source Oral, resp. rate (!) 27, height 5\' 9"  (1.753 m), weight 86.2 kg, SpO2 99 %.    Physical Exam Cardiovascular:     Rate and Rhythm: Normal rate and regular rhythm.     Pulses: Intact distal pulses.     Heart sounds: Normal heart sounds. No murmur heard.  No gallop.      Comments: No leg edema, no JVD. Pulmonary:     Effort: Pulmonary effort is normal.     Breath sounds: Examination of the right-lower field reveals  decreased breath sounds, wheezing and rhonchi. Examination of the left-lower field reveals decreased breath sounds. Decreased breath sounds, wheezing and rhonchi present.  Abdominal:     General: Bowel sounds are normal.     Palpations: Abdomen is soft.    Laboratory examination:   Recent Labs    09/26/18 1013 08/16/19 1342 09/15/19 0734  NA 143 140 140  K 4.7 3.9 4.6  CL 103 102 99  CO2 23 29 29   GLUCOSE 123* 108* 173*  BUN 17 17 12   CREATININE 1.07 0.95 1.00  CALCIUM 9.5 9.4 9.2  GFRNONAA 65 >60 >60  GFRAA 75 >60 >60   estimated creatinine clearance is 63 mL/min (by C-G formula based on SCr of 1 mg/dL).  CMP Latest Ref Rng & Units 09/15/2019 08/16/2019 09/26/2018  Glucose 70 - 99 mg/dL 173(H) 108(H) 123(H)  BUN 8 - 23 mg/dL 12 17 17   Creatinine 0.61 - 1.24 mg/dL 1.00 0.95 1.07  Sodium 135 - 145 mmol/L 140 140 143  Potassium 3.5 - 5.1 mmol/L 4.6 3.9 4.7  Chloride 98 - 111 mmol/L 99 102 103  CO2 22 - 32 mmol/L 29 29 23   Calcium 8.9 - 10.3 mg/dL 9.2 9.4 9.5  Total Protein 6.5 - 8.1 g/dL - 7.3 7.4  Total Bilirubin 0.3 - 1.2 mg/dL - 1.1 0.9  Alkaline Phos 38 - 126 U/L - 78 65  AST 15 - 41 U/L - 14(L) 24  ALT 0 - 44 U/L - 12 19   CBC Latest Ref Rng & Units 09/15/2019 08/16/2019 09/26/2018  WBC 4.0 - 10.5 K/uL 15.0(H) 13.4(H) 11.6(H)  Hemoglobin 13.0 - 17.0 g/dL 12.8(L) 12.9(L) 14.7  Hematocrit 39 - 52 % 41.3 40.2 44.0  Platelets 150 - 400 K/uL 198 184 151   Lipid Panel  Component Value Date/Time   CHOL 83 (L) 09/26/2018 1013   TRIG 71 09/26/2018 1013   HDL 38 (L) 09/26/2018 1013   CHOLHDL 2 03/20/2014 1002   VLDL 20.0 03/20/2014 1002   LDLCALC 31 09/26/2018 1013   HEMOGLOBIN A1C No results found for: HGBA1C, MPG TSH Recent Labs    09/26/18 1013  TSH 1.130   BNP (last 3 results) Recent Labs    09/15/19 0820  BNP 32.4    Medications and allergies   Allergies  Allergen Reactions  . Entresto [Sacubitril-Valsartan] Diarrhea    Patient had diarrhea,  hyptotensive,dizziness,headache and lethargic  . Iodinated Diagnostic Agents Swelling and Other (See Comments)    Patient experienced tongue tingling, followed by swelling under tongue and in throat area, also patient had eye redness/irriation - Isovve Nonionic Contrast 75U  . Penicillins Swelling    SWELLING OF TONGUE  . Sulfonamide Derivatives Swelling    SWELLING HANDS  . Lumigan [Bimatoprost] Swelling    EYES SWELL  . Lyrica [Pregabalin] Other (See Comments)    TREMORS CONFUSION  . Symbicort [Budesonide-Formoterol Fumarate] Itching and Swelling  . Valsartan Other (See Comments)    BP drop  . Advair Diskus [Fluticasone-Salmeterol] Itching  . Azithromycin Itching    Itching from head to toe  . Brimonidine Tartrate     unknown  . Ciprofloxacin     Patient does not want to have this anymore(does not want any forms of this) patient reports having C Diff after taking this  . Flovent Diskus [Fluticasone Propionate (Inhal)] Itching  . Pulmicort [Budesonide] Itching and Swelling  . Qvar [Beclomethasone] Itching and Swelling  . Spiriva [Tiotropium Bromide Monohydrate] Itching and Swelling  . Tessalon [Benzonatate] Other (See Comments)    Dizzy/skin reaction  . Bacitracin Itching, Swelling and Anxiety  . Flonase [Fluticasone Propionate] Other (See Comments)    NOSE BLEED  . Relafen [Nabumetone] Rash    REACTION: Reaction not known to relafen  . Tape Itching, Rash and Other (See Comments)    Skin tear, please use paper tape     . cefTRIAXone (ROCEPHIN)  IV 1 g (09/15/19 1243)    Current Outpatient Medications  Medication Instructions  . albuterol (PROAIR HFA) 108 (90 BASE) MCG/ACT inhaler 2 puffs, Inhalation, Every 6 hours PRN  . aspirin 81 mg, Oral, Daily  . atorvastatin (LIPITOR) 20 mg, Oral, Daily  . azithromycin (ZITHROMAX) 250 MG tablet Use as instructed.  Marland Kitchen CALCIUM-MAGNESIUM-VITAMIN D PO 1 tablet, Oral, Daily  . gabapentin (NEURONTIN) 600 mg, Oral, 3 times daily  .  hydrOXYzine (ATARAX/VISTARIL) 10 mg, Oral, Every 6 hours PRN  . latanoprost (XALATAN) 0.005 % ophthalmic solution 1 drop, Both Eyes, Daily at bedtime  . metoprolol succinate (TOPROL-XL) 25 mg, Oral,  Every morning - 10a  . nitroGLYCERIN (NITROSTAT) 0.4 mg, Sublingual, Every 5 min PRN, For chest pain  . OXYGEN 2 L, Inhalation, Daily at bedtime  . sucralfate (CARAFATE) 1 g, Oral, 3 times daily with meals & bedtime, 30 mins prior to meals, crush and dissolve in 10 mL of warm water prior to swallowing  . tamsulosin (FLOMAX) 0.4 mg, Oral, Every evening  . timolol (TIMOPTIC) 0.5 % ophthalmic solution 1 drop, Both Eyes, 2 times daily    No intake/output data recorded. Total I/O In: 100 [IV Piggyback:100] Out: -     Radiology:   Imaging: DG Chest 2 View  Result Date: 09/15/2019 CLINICAL DATA:  Chest pain and shortness of breath 4 days. Recent pneumonia. EXAM: CHEST -  2 VIEW COMPARISON:  08/30/2019 FINDINGS: Lungs are somewhat hypoinflated demonstrate interval worsening bilateral patchy hazy airspace process over the mid to lower lungs likely multifocal pneumonia. No evidence of effusion. Cardiomediastinal silhouette and remainder of the exam is unchanged. IMPRESSION: Hypoinflation with interval worsening bilateral patchy airspace process over the mid to lower lungs likely multifocal pneumonia. Electronically Signed   By: Marin Olp M.D.   On: 09/15/2019 08:19    Cardiac Studies:   Coronary Angiography  PCI  In 2011 (DES to RCA) and [place] and PCI in 07/2011 (DES to proximal CX and balloon angioplasty to proximal RCA in-stent restenosis),  Abdominal Aortic Duplex  07/18/2015: 3.3 cm distal abdominal aortic aneurysm with aneurysmal dilatation over 6 cm length noted. Similar findings noted on priors study. Recommend followup by [place] in 3 years.   Echocardiogram 03/23/2016: Poor echo window. Wall motion abnormality has reduced sensitivity. Left ventricle cavity is normal in size. Mild Diffuse  hypokinesis Mild concentric hypertrophy of the left ventricle. Mild decrease in LV systolic function. Grade I diastolic dysfunction. Visual EF is 45%. Calculated EF 38%. Right ventricle cavity is borderline dilated. Normal right ventricular function. Trace tricuspid regurgitation. No evidence of pulmonary hypertension.  Carotid artery duplex 03/27/2018: Moderate amount of plaque at the carotid bulbs and internal carotid arteries, right side greater than left. Estimated degree of stenosis in the internal carotid arteries is less than 50% bilaterally. Patent vertebral arteries with antegrade flow.  EKG:  EKG 09/15/2019: Normal sinus rhythm at rate of 98 bpm, normal axis.  No evidence of ischemia, normal EKG.  Assessment   1.  Chronic stable angina pectoris 2.  Coronary artery disease with history of angioplasty remotely to the right coronary artery and to the circumflex coronary artery in 2011 and 2013. 3.  Lung cancer SP radiation therapy, presently in remission. 4.  Hypoxemia, multilobar pneumonia.  Recommendations:   I reviewed his EKG, labs, suspect hypoxemia led to an episode of angina which immediately resolved with 1 sublingual nitroglycerin.  I discussed with the patient and his wife at the bedside that if he has recurrence of angina pectoris taking 1 to 2 tablets with relief is to be expected in view of underlying coronary artery disease.  As this is the first time he has had angina in a while, as his CBC and platelet counts are within normal limits, would recommend starting him on Plavix for at least 2 to 3 months.  If he has recurrence of angina, could consider addition of isosorbide mononitrate 60 mg daily or amlodipine 5 mg daily.  He is now being admitted for hypoxemia and multilobar pneumonia and treatment with IV antibiotics.  From cardiac status he remained stable, there is no clinical evidence of heart failure, agree with obtaining an echocardiogram to reestablish his baseline  LVEF again as it has been greater than 3 to 4 years since last echocardiogram.  His dyspnea on exertion is related to underlying COPD and radiation therapy and pneumonia.  Please call if questions.   Adrian Prows, MD, [facility] 09/15/2019, 1:07 PM [facility] [place] Pager: [phone] Office: [phone]

## 2019-09-15 NOTE — Progress Notes (Signed)
  Echocardiogram 2D Echocardiogram has been performed.  [name] 09/15/2019, 5:00 PM

## 2019-09-15 NOTE — H&P (Signed)
History and Physical    [name]:[id number] DOB: 30-Sep-1935 DOA: 09/15/2019  PCP: Wenda Low, MD (Confirm with patient/family/NH records and if not entered, this has to be entered at [facility] point of entry) Patient coming from: Home  I have personally briefly reviewed patient's old medical records in [address]  Chief Complaint: Chest pain, SOB  HPI: [name] is a 84 y.o. 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  Patient underwent a endobronchial ultrasound and mediastinal and hilar lymph node biopsy on June 17th after a positive PET study showing suspicious lesions recently.  Biopsy results pathology and cytology x4 was showed negative for malignancy but suspicious for inflammatory/infection.  Patient then developed a cough initially dry then occasionally with yellowish sputum production. He then went to see his oncologist on June 24, who diagnosed him with left upper lobe pneumonia and was started on Z-Pak.  Patient took 3 pills and developed severe rash and itchiness of the scalp and went to [facility] 2 days ago, and he stopped taking Z-Pak altogether.  He denies any fever or chills.  But over the last 2 days the patient gradually feel increasing short of breath, and this morning patient woke up with right-sided neck pain and soon developed pressure-like chest pain, which he described as the similar type of chest pain he experienced when he had MI before.  He took 1 nitroglycerin right away and chest pain subsided.  His wife found his oxygen was low and increased his oxygen from 2 L to 3 L. ED Course: Chest x-ray showed worsening of patchy infiltrates bilaterally more on the right lower field and  left upper field compared to the one done on June 17.  Patient was also found to have hypoxia stabilized on 4 L.  Review of Systems: As per HPI otherwise 10 point review of systems negative.    Past Medical History:  Diagnosis Date  . AAA (abdominal aortic aneurysm) (HCC)    measures 2.7 cm.  . Arthritis   . Back pain   . BPH (benign prostatic hypertrophy)   . Bruises easily   . CAD (coronary artery disease)   . Cervical spine fracture ([place]) feb 1990   C 6, C 7 and T 1, no surgery done wore halo brace  . CLL (chronic lymphocytic leukemia) ([place])    hx of worked up 2009 by dr Benay Spice, no tx done, released by dr Benay Spice  . Complication of anesthesia    cannot lie on right side gets vertigo and trouble turning neck to right  . COPD (chronic obstructive pulmonary disease) ([place]) may 2005  . Erectile dysfunction   . Fatigue   . GERD (gastroesophageal reflux disease)   . Glaucoma   . Hepatitis as child   serum hepatitis  . History of oxygen administration    oxygen  @2   l/m nasally at bedtime.  Marland Kitchen History of radiation therapy 06/22/16-06/28/16   Left upper lobe of lung/ 54 Gy in 3 fractions  . Hyperlipidemia   . Hypertension   . Impaired hearing    wears Hearing aids  . Ischemic cardiomyopathy   . Leg pain   . Lumbar disc disease   . Lung cancer ([place]) dx'd 03/18/16  .  Microscopic hematuria   . Myocardial infarct ([place]) 03-17-2009   Hx MI 2000  . Neuropathy    both feet and legs  . Osteopenia   . Pneumonia    hx x2  . Thyroid nodule     Past Surgical History:  Procedure Laterality Date  . APPENDECTOMY  sept, 10, 2005  . BACK SURGERY  2009   L 2 and L 3   . benign cyst removed from left neck  02-2010  . CERVICAL DISC SURGERY  2005-2008   C 2, C 3 and C 4 2005, rods placed C 6, C 7 and T 1  . COLONOSCOPY WITH PROPOFOL N/A 11/26/2014   Procedure: COLONOSCOPY WITH PROPOFOL;  Surgeon: Garlan Fair, MD;  Location: [facility];  Service: Endoscopy;  Laterality: N/A;  .  CORONARY ANGIOPLASTY WITH STENT PLACEMENT  2013   1 stent repair, one stent replaced  . ESOPHAGOGASTRODUODENOSCOPY (EGD) WITH PROPOFOL N/A 11/27/2013   Procedure: ESOPHAGOGASTRODUODENOSCOPY (EGD) WITH PROPOFOL;  Surgeon: Garlan Fair, MD;  [facility];  Service: Endoscopy;  Laterality: N/A;  . EYE SURGERY Bilateral july 2011   eye surgry for glaucoma  . FUDUCIAL PLACEMENT N/A 05/12/2016   Procedure: PLACEMENT OF FUDUCIAL;  Surgeon: Melrose Nakayama, MD;  Location: [place];  Service: Thoracic;  Laterality: N/A;  . fusion L 3 and L 4  August 15, 2008  . LEFT HEART CATHETERIZATION WITH CORONARY ANGIOGRAM N/A 08/12/2011   Procedure: LEFT HEART CATHETERIZATION WITH CORONARY ANGIOGRAM;  Surgeon: Larey Dresser, MD;  Location: [facility] CATH LAB;  Service: Cardiovascular;  Laterality: N/A;  . NASAL SINUS SURGERY   oct 2005  . right knee arthroscopy  1981  . stent top heart  2011   x 2  . TONSILLECTOMY  1943  . VIDEO BRONCHOSCOPY WITH ENDOBRONCHIAL NAVIGATION N/A 05/12/2016   Procedure: VIDEO BRONCHOSCOPY WITH ENDOBRONCHIAL NAVIGATION;  Surgeon: Melrose Nakayama, MD;  Location: [place];  Service: Thoracic;  Laterality: N/A;  . VIDEO BRONCHOSCOPY WITH ENDOBRONCHIAL NAVIGATION N/A 08/30/2019   Procedure: VIDEO BRONCHOSCOPY WITH ENDOBRONCHIAL NAVIGATION;  Surgeon: Melrose Nakayama, MD;  Location: [place];  Service: Thoracic;  Laterality: N/A;  . VIDEO BRONCHOSCOPY WITH ENDOBRONCHIAL ULTRASOUND N/A 08/30/2019   Procedure: VIDEO BRONCHOSCOPY WITH ENDOBRONCHIAL ULTRASOUND;  Surgeon: Melrose Nakayama, MD;  Location: [place];  Service: Thoracic;  Laterality: N/A;     reports that he quit smoking about 13 years ago. His smoking use included cigarettes. He has a 110.00 pack-year smoking history. He has never used smokeless tobacco. He reports that he does not drink alcohol and does not use drugs.  Allergies  Allergen Reactions  . Entresto [Sacubitril-Valsartan] Diarrhea    Patient had diarrhea,  hyptotensive,dizziness,headache and lethargic  . Iodinated Diagnostic Agents Swelling and Other (See Comments)    Patient experienced tongue tingling, followed by swelling under tongue and in throat area, also patient had eye redness/irriation - Isovve Nonionic Contrast 75U  . Penicillins Swelling    SWELLING OF TONGUE  . Sulfonamide Derivatives Swelling    SWELLING HANDS  . Lumigan [Bimatoprost] Swelling    EYES SWELL  . Lyrica [Pregabalin] Other (See Comments)    TREMORS CONFUSION  . Symbicort [Budesonide-Formoterol Fumarate] Itching and Swelling  . Valsartan Other (See Comments)    BP drop  . Advair Diskus [Fluticasone-Salmeterol] Itching  . Azithromycin Itching    Itching from head to toe  . Brimonidine Tartrate     unknown  . Ciprofloxacin  Patient does not want to have this anymore(does not want any forms of this) patient reports having C Diff after taking this  . Flovent Diskus [Fluticasone Propionate (Inhal)] Itching  . Pulmicort [Budesonide] Itching and Swelling  . Qvar [Beclomethasone] Itching and Swelling  . Spiriva [Tiotropium Bromide Monohydrate] Itching and Swelling  . Tessalon [Benzonatate] Other (See Comments)    Dizzy/skin reaction  . Bacitracin Itching, Swelling and Anxiety  . Flonase [Fluticasone Propionate] Other (See Comments)    NOSE BLEED  . Relafen [Nabumetone] Rash    REACTION: Reaction not known to relafen  . Tape Itching, Rash and Other (See Comments)    Skin tear, please use paper tape    Family History  Problem Relation Age of Onset  . Lung cancer Father 73       died.   . Alzheimer's disease Mother        died of natural causes  . Cancer Brother        jaw  . Renal cancer Brother   . Lung cancer Brother      Prior to Admission medications   Medication Sig Start Date End Date Taking? Authorizing Provider  albuterol (PROAIR HFA) 108 (90 BASE) MCG/ACT inhaler Inhale 2 puffs into the lungs every 6 (six) hours as needed for wheezing or  shortness of breath.    Yes [provider]  aspirin 81 MG tablet Take 81 mg by mouth daily.    Yes [provider]  atorvastatin (LIPITOR) 20 MG tablet Take 20 mg by mouth daily.  01/31/18  Yes [provider]  CALCIUM-MAGNESIUM-VITAMIN D PO Take 1 tablet by mouth daily.   Yes [provider]  gabapentin (NEURONTIN) 300 MG capsule Take 600 mg by mouth 3 (three) times daily.    Yes [provider]  hydrOXYzine (ATARAX/VISTARIL) 10 MG tablet Take 1 tablet (10 mg total) by mouth every 6 (six) hours as needed for itching. 09/13/19  Yes Letitia Neri L, PA-C  latanoprost (XALATAN) 0.005 % ophthalmic solution Place 1 drop into both eyes at bedtime.  08/16/13  Yes [provider]  metoprolol succinate (TOPROL-XL) 25 MG 24 hr tablet Take 25 mg by mouth every morning.    Yes [provider]  nitroGLYCERIN (NITROSTAT) 0.4 MG SL tablet Place 0.4 mg under the tongue every 5 (five) minutes as needed for chest pain. For chest pain    Yes [provider]  OXYGEN Inhale 2 L into the lungs at bedtime.    Yes [provider]  sucralfate (CARAFATE) 1 g tablet Take 1 tablet (1 g total) by mouth 4 (four) times daily -  with meals and at bedtime. 30 mins prior to meals, crush and dissolve in 10 mL of warm water prior to swallowing Patient taking differently: Take 1 g by mouth 4 (four) times daily. 30 mins prior to meals, crush and dissolve in 10 mL of warm water prior to swallowing 06/25/19  Yes Gery Pray, MD  Tamsulosin HCl (FLOMAX) 0.4 MG CAPS Take 0.4 mg by mouth every evening.    Yes [provider]  timolol (TIMOPTIC) 0.5 % ophthalmic solution Place 1 drop into both eyes 2 (two) times daily.  04/09/19  Yes [provider]  azithromycin (ZITHROMAX) 250 MG tablet Use as instructed. Patient not taking: Reported on 09/15/2019 09/07/19   Curt Bears, MD    Physical Exam: Vitals:   09/15/19 0859 09/15/19 0929 09/15/19  0959 09/15/19 1029  BP: 124/78 111/74 113/76 116/71  Pulse: (!) 104 94 (!) 101 99  Resp: (!) 21 (!) 25 (!) 27   Temp:      TempSrc:      SpO2: 100% 100% 97% 98%  Weight:      Height:        Constitutional: NAD, calm, comfortable Vitals:   09/15/19 0859 09/15/19 0929 09/15/19 0959 09/15/19 1029  BP: 124/78 111/74 113/76 116/71  Pulse: (!) 104 94 (!) 101 99  Resp: (!) 21 (!) 25 (!) 27   Temp:      TempSrc:      SpO2: 100% 100% 97% 98%  Weight:      Height:       Eyes: PERRL, lids and conjunctivae normal ENMT: Mucous membranes are moist. Posterior pharynx clear of any exudate or lesions.Normal dentition.  Neck: normal, supple, no masses, no thyromegaly Respiratory: clear to auscultation bilaterally, scattered crackles on bilateral.  Increased respiratory effort. No accessory muscle use.  Cardiovascular: Regular rate and rhythm, no murmurs / rubs / gallops. No extremity edema. 2+ pedal pulses. No carotid bruits.  Abdomen: no tenderness, no masses palpated. No hepatosplenomegaly. Bowel sounds positive.  Musculoskeletal: no clubbing / cyanosis. No joint deformity upper and lower extremities. Good ROM, no contractures. Normal muscle tone.  Skin: no rashes, lesions, ulcers. No induration Neurologic: CN 2-12 grossly intact. Sensation intact, DTR normal. Strength 5/5 in all 4.  Psychiatric: Normal judgment and insight. Alert and oriented x 3. Normal mood.    Labs on Admission: I have personally reviewed following labs and imaging studies  CBC: Recent Labs  Lab 09/15/19 0734  WBC 15.0*  HGB 12.8*  HCT 41.3  MCV 93.4  PLT 540   Basic Metabolic Panel: Recent Labs  Lab 09/15/19 0734  NA 140  K 4.6  CL 99  CO2 29  GLUCOSE 173*  BUN 12  CREATININE 1.00  CALCIUM 9.2   GFR: Estimated Creatinine Clearance: 63 mL/min (by C-G formula based on SCr of 1 mg/dL). Liver Function Tests: No results for input(s): AST, ALT, ALKPHOS, BILITOT, PROT, ALBUMIN in the last 168 hours. No  results for input(s): LIPASE, AMYLASE in the last 168 hours. No results for input(s): AMMONIA in the last 168 hours. Coagulation Profile: No results for input(s): INR, PROTIME in the last 168 hours. Cardiac Enzymes: No results for input(s): CKTOTAL, CKMB, CKMBINDEX, TROPONINI in the last 168 hours. BNP (last 3 results) No results for input(s): PROBNP in the last 8760 hours. HbA1C: No results for input(s): HGBA1C in the last 72 hours. CBG: No results for input(s): GLUCAP in the last 168 hours. Lipid Profile: No results for input(s): CHOL, HDL, LDLCALC, TRIG, CHOLHDL, LDLDIRECT in the last 72 hours. Thyroid Function Tests: No results for input(s): TSH, T4TOTAL, FREET4, T3FREE, THYROIDAB in the last 72 hours. Anemia Panel: No results for input(s): VITAMINB12, FOLATE, FERRITIN, TIBC, IRON, RETICCTPCT in the last 72 hours. Urine analysis:    Component Value Date/Time   COLORURINE YELLOW 01/16/2008 [place] 01/16/2008 1617   LABSPEC 1.023 01/16/2008 1617   PHURINE 5.5 01/16/2008 1617   GLUCOSEU NEGATIVE 01/16/2008 1617   HGBUR SMALL (A) 01/16/2008 1617   BILIRUBINUR NEGATIVE 01/16/2008 1617   KETONESUR NEGATIVE 01/16/2008 1617   PROTEINUR NEGATIVE 01/16/2008 1617   UROBILINOGEN 0.2 01/16/2008 1617   NITRITE NEGATIVE 01/16/2008 1617   LEUKOCYTESUR NEGATIVE 01/16/2008 1617    Radiological Exams on Admission: DG Chest 2 View  Result Date: 09/15/2019 CLINICAL DATA:  Chest pain and shortness  of breath 4 days. Recent pneumonia. EXAM: CHEST - 2 VIEW COMPARISON:  08/30/2019 FINDINGS: Lungs are somewhat hypoinflated demonstrate interval worsening bilateral patchy hazy airspace process over the mid to lower lungs likely multifocal pneumonia. No evidence of effusion. Cardiomediastinal silhouette and remainder of the exam is unchanged. IMPRESSION: Hypoinflation with interval worsening bilateral patchy airspace process over the mid to lower lungs likely multifocal pneumonia.  Electronically Signed   By: Marin Olp M.D.   On: 09/15/2019 08:19    EKG: Independently reviewed.  Sinus, no acute ST changes  Assessment/Plan Active Problems:   Aspiration pneumonia (HCC)   Pneumonia  (please populate well all problems here in Problem List. (For example, if patient is on BP meds at home and you resume or decide to hold them, it is a problem that needs to be her. Same for CAD, COPD, HLD and so on)  Acute on chronic hypoxic respite failure -Secondary to pneumonia likely post obstructive, discussed with pharmacy, will switch patient to ceftriaxone and Flagyl regimen and monitor allergic reaction -Breathing treatment and guaifenesin  Chest pain, angina-like -Discussed with patient cardiologist Dr. Einar Gip, who suggested that the angina likely from acute hypoxia from the pneumonia.  Even patient age and comorbidity, recommend conservative evaluation for now. -Echocardiogram -Continue aspirin and statin  History of cardiomyopathy with systolic CHF -Clinically patient is euvolemic, not on any baseline diuresis, echo ordered, and discussed with cardiologist.  Left sided lung CA -As per oncologist and thoracic surgery, repeat CAT scan in 3 months  Partial vocal cord paralysis -Was evaluated by ENT recently.  Will order speech evaluation, patient looks lysed been eating normal texture diet at home  HTN Continue home meds  HLD -Statin  DVT prophylaxis: Lovenox Code Status: Full code Family Communication: Wife at bedside Disposition Plan: Patient has postobstructive pneumonia failed outpatient treatment, likely will need more than 2 midnight hospital stay to treat pneumonia. Consults called: Cardiology Admission status: Telemetry admission   Lequita Halt MD [facility] Pager [phone]    09/15/2019, 11:47 AM

## 2019-09-15 NOTE — Evaluation (Signed)
Clinical/Bedside Swallow Evaluation Patient Details  Name: [name] MRN: [id number] Date of Birth: 1936-02-25  Today's Date: 09/15/2019 Time: SLP Start Time (ACUTE ONLY): 2458 SLP Stop Time (ACUTE ONLY): 1555 SLP Time Calculation (min) (ACUTE ONLY): 24 min  Past Medical History:  Past Medical History:  Diagnosis Date  . AAA (abdominal aortic aneurysm) (HCC)    measures 2.7 cm.  . Arthritis   . Back pain   . BPH (benign prostatic hypertrophy)   . Bruises easily   . CAD (coronary artery disease)   . Cervical spine fracture ([place]) feb 1990   C 6, C 7 and T 1, no surgery done wore halo brace  . CLL (chronic lymphocytic leukemia) ([place])    hx of worked up 2009 by dr Benay Spice, no tx done, released by dr Benay Spice  . Complication of anesthesia    cannot lie on right side gets vertigo and trouble turning neck to right  . COPD (chronic obstructive pulmonary disease) ([place]) may 2005  . Erectile dysfunction   . Fatigue   . GERD (gastroesophageal reflux disease)   . Glaucoma   . Hepatitis as child   serum hepatitis  . History of oxygen administration    oxygen  @2   l/m nasally at bedtime.  Marland Kitchen History of radiation therapy 06/22/16-06/28/16   Left upper lobe of lung/ 54 Gy in 3 fractions  . Hyperlipidemia   . Hypertension   . Impaired hearing    wears Hearing aids  . Ischemic cardiomyopathy   . Leg pain   . Lumbar disc disease   . Lung cancer ([place]) dx'd 03/18/16  . Microscopic hematuria   . Myocardial infarct ([place]) 03-17-2009   Hx MI 2000  . Neuropathy    both feet and legs  . Osteopenia   . Pneumonia    hx x2  . Thyroid nodule    Past Surgical History:  Past Surgical History:  Procedure Laterality Date  . APPENDECTOMY  sept, 10, 2005  . BACK SURGERY  2009   L 2 and L 3   . benign cyst removed from left neck  02-2010  . CERVICAL DISC SURGERY  2005-2008   C 2, C 3 and C 4 2005, rods placed C 6, C 7 and T 1  . COLONOSCOPY WITH PROPOFOL N/A 11/26/2014   Procedure: COLONOSCOPY  WITH PROPOFOL;  Surgeon: Garlan Fair, MD;  [facility];  Service: Endoscopy;  Laterality: N/A;  . CORONARY ANGIOPLASTY WITH STENT PLACEMENT  2013   1 stent repair, one stent replaced  . ESOPHAGOGASTRODUODENOSCOPY (EGD) WITH PROPOFOL N/A 11/27/2013   Procedure: ESOPHAGOGASTRODUODENOSCOPY (EGD) WITH PROPOFOL;  Surgeon: Garlan Fair, MD;  [facility];  Service: Endoscopy;  Laterality: N/A;  . EYE SURGERY Bilateral july 2011   eye surgry for glaucoma  . FUDUCIAL PLACEMENT N/A 05/12/2016   Procedure: PLACEMENT OF FUDUCIAL;  Surgeon: Melrose Nakayama, MD;  Location: [place];  Service: Thoracic;  Laterality: N/A;  . fusion L 3 and L 4  August 15, 2008  . LEFT HEART CATHETERIZATION WITH CORONARY ANGIOGRAM N/A 08/12/2011   Procedure: LEFT HEART CATHETERIZATION WITH CORONARY ANGIOGRAM;  Surgeon: Larey Dresser, MD;  Location[facility] CATH LAB;  Service: Cardiovascular;  Laterality: N/A;  . NASAL SINUS SURGERY   oct 2005  . right knee arthroscopy  1981  . stent top heart  2011   x 2  . TONSILLECTOMY  1943  . VIDEO BRONCHOSCOPY WITH ENDOBRONCHIAL NAVIGATION N/A 05/12/2016  Procedure: VIDEO BRONCHOSCOPY WITH ENDOBRONCHIAL NAVIGATION;  Surgeon: Melrose Nakayama, MD;  Location: [place];  Service: Thoracic;  Laterality: N/A;  . VIDEO BRONCHOSCOPY WITH ENDOBRONCHIAL NAVIGATION N/A 08/30/2019   Procedure: VIDEO BRONCHOSCOPY WITH ENDOBRONCHIAL NAVIGATION;  Surgeon: Melrose Nakayama, MD;  Location: [place];  Service: Thoracic;  Laterality: N/A;  . VIDEO BRONCHOSCOPY WITH ENDOBRONCHIAL ULTRASOUND N/A 08/30/2019   Procedure: VIDEO BRONCHOSCOPY WITH ENDOBRONCHIAL ULTRASOUND;  Surgeon: Melrose Nakayama, MD;  Location: [place];  Service: Thoracic;  Laterality: N/A;   HPI:  84 y.o. male with medical history significant of left-sided lung adenocarcinoma status post radiation therapy in Dec-Jan/2020-2021, chronic hoarseness with one-sided vocal cord paralysis, CAD with stents x3, sleep  apnea on at bedtime CPAP oxygen 2 L, HTN, HLD, presented with worsening short of breath and chest pain this morning.  CXR on 09/15/19 indicated: Hypoinflation with interval worsening bilateral patchy airspace process over the mid to lower lungs likely multifocal pneumonia  Assessment / Plan / Recommendation Clinical Impression  Pt presents with oropharyngeal dysphagia characterized by immediate cough, intermittent wet vocal quality during PO intake with breathy, low intensity and high pitched quality during speaking tasks; pt stated he "uses a head tilt right  when eating and this helps him to cough less during meals".  Education provided for pt/wife re: aspiration risk/potential PNA resulting from aspiration despite use of compensatory strategies; pt in agreement with proceeding with MBS to determine safest diet/liquids and compensatory strategies effective for eliminating potential aspiration.  Recommend use of head tilt right with small sips with regular/thin liquid diet and aspiration precautions; MBS pending to determine goals.  Thank you for this consult. SLP Visit Diagnosis: Dysphagia, oropharyngeal phase (R13.12)    Aspiration Risk  Mild aspiration risk;Moderate aspiration risk    Diet Recommendation   Regular/thin liquids  Medication Administration: Whole meds with puree    Other  Recommendations Oral Care Recommendations: Oral care BID   Follow up Recommendations Other (comment) (TBD)      Frequency and Duration min 2x/week  1 week       Prognosis Prognosis for Safe Diet Advancement: Good      Swallow Study   General Date of Onset: 09/15/19 HPI: 84 y.o. 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  Type  of Study: Bedside Swallow Evaluation Previous Swallow Assessment: n/a Diet Prior to this Study: Regular;Thin liquids Temperature Spikes Noted: Yes (low grade, 99) Respiratory Status: Nasal cannula;Other (comment) (2L) History of Recent Intubation: No Behavior/Cognition: Alert;Cooperative;Pleasant mood Oral Cavity Assessment: Within Functional Limits Oral Care Completed by SLP: No Oral Cavity - Dentition: Adequate natural dentition Vision: Functional for self-feeding Self-Feeding Abilities: Able to feed self Patient Positioning: Upright in bed Baseline Vocal Quality: Low vocal intensity;Breathy;Other (comment) (high pitched) Volitional Cough: Weak Volitional Swallow: Able to elicit    Oral/Motor/Sensory Function Overall Oral Motor/Sensory Function: Within functional limits   Ice Chips Ice chips: Not tested   Thin Liquid Thin Liquid: Impaired Presentation: Cup;Straw Pharyngeal  Phase Impairments: Cough - Immediate;Wet Vocal Quality Other Comments:  (used head tilt right independently)    Nectar Thick Nectar Thick Liquid: Not tested   Honey Thick Honey Thick Liquid: Not tested   Puree Puree: Within functional limits Presentation: Self Fed   Solid  Solid: Within functional limits Presentation: Self Fed      Elvina Sidle, M.S., CCC-SLP 09/15/2019,4:40 PM

## 2019-09-16 ENCOUNTER — Inpatient Hospital Stay (HOSPITAL_COMMUNITY): Payer: Medicare Other

## 2019-09-16 DIAGNOSIS — I209 Angina pectoris, unspecified: Secondary | ICD-10-CM

## 2019-09-16 DIAGNOSIS — J690 Pneumonitis due to inhalation of food and vomit: Principal | ICD-10-CM

## 2019-09-16 DIAGNOSIS — J9621 Acute and chronic respiratory failure with hypoxia: Secondary | ICD-10-CM

## 2019-09-16 LAB — BASIC METABOLIC PANEL
Anion gap: 11 (ref 5–15)
BUN: 13 mg/dL (ref 8–23)
CO2: 28 mmol/L (ref 22–32)
Calcium: 8.7 mg/dL — ABNORMAL LOW (ref 8.9–10.3)
Chloride: 99 mmol/L (ref 98–111)
Creatinine, Ser: 0.87 mg/dL (ref 0.61–1.24)
GFR calc Af Amer: >60 mL/min (ref >60)
GFR calc non Af Amer: >60 mL/min (ref >60)
Glucose, Bld: 123 mg/dL — ABNORMAL HIGH (ref 70–99)
Potassium: 5 mmol/L (ref 3.5–5.1)
Sodium: 138 mmol/L (ref 135–145)

## 2019-09-16 LAB — CBC
HCT: 39 % (ref 39.0–52.0)
Hemoglobin: 12.3 g/dL — ABNORMAL LOW (ref 13.0–17.0)
MCH: 29.4 pg (ref 26.0–34.0)
MCHC: 31.5 g/dL (ref 30.0–36.0)
MCV: 93.1 fL (ref 80.0–100.0)
Platelets: 201 10*3/uL (ref 150–400)
RBC: 4.19 MIL/uL — ABNORMAL LOW (ref 4.22–5.81)
RDW: 13.9 % (ref 11.5–15.5)
WBC: 11.6 10*3/uL — ABNORMAL HIGH (ref 4.0–10.5)
nRBC: 0 % (ref 0.0–0.2)

## 2019-09-16 LAB — PROCALCITONIN: Procalcitonin: <0.1 ng/mL

## 2019-09-16 MED ORDER — RESOURCE THICKENUP CLEAR PO POWD
ORAL | Status: DC | PRN
  Filled 2019-09-16: qty 125

## 2019-09-16 NOTE — Progress Notes (Signed)
Specimen cup given to pt for sputum collection, pt as not been able to bring up any phlegm at this time.

## 2019-09-16 NOTE — Progress Notes (Signed)
Modified Barium Swallow Progress Note  Patient Details  Name: [name] MRN: [id number] Date of Birth: 1935-05-21  Today's Date: 09/16/2019  Modified Barium Swallow completed.  Full report located under Chart Review in the Imaging Section.  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 Pt reports he often gets choked with liquids, particulalry if he drinks quickly. He uses a positional strategy of deep bend at his torso with a head turn left to divert the bolus down the right side of his pharynx. Under fluoroscopy with head in neutral with initial sips, pt took careful single sips of thin with a slight oral hold with no aspiration. When instructed to drink consecutively he had severe aspiraiton during the swallow with insufficient dysphonic cough to clear. Cough seemed more sensitive in the trachea than the vestibule or glottis as pt did not always have a reflexive cough with frank penetration or trace aspiration just below the VF. When small single sips of thin were repeated in neutral pt had ongoing aspiration and prior success could not be repeated. Pt reduced quantity of aspiration to trace high aspiration with a head turn left and tilt right (deep bend not needed) and with a cUED throat clear, ejected aspiration. Nectar thick liquids could be consumed without positional strategies resulting in a coating on the cords. Pt has mild residue with all textures that he clears with second swallow. Recommend pt primarily consume nectar thick liquids, but can have sips of thin with positional strategies. Needs education on thickening, pulmonary hygiene, oral care and f/u with ENT and OP SLP to address voice and swallowing needs.    Swallow Evaluation Recommendations       SLP Diet  Recommendations: Regular solids;Nectar thick liquid;Thin liquid   Liquid Administration via: Cup   Medication Administration: Whole meds with puree   Supervision: Patient able to self feed   Compensations: Slow rate;Small sips/bites;Clear throat intermittently;Multiple dry swallows after each bite/sip (Head turn left, tilt right)   Postural Changes: Seated upright at 90 degrees   Oral Care Recommendations: Oral care QID   Other Recommendations: Have oral suction available   Herbie [place], MA [place] Pager [phone] Office [phone]  Lynann Beaver 09/16/2019,8:39 AM

## 2019-09-16 NOTE — Progress Notes (Signed)
PROGRESS NOTE        PATIENT DETAILS Name: [name] Age: 84 y.o. Sex: male Date of Birth: 08-Jul-1935 Admit Date: 09/15/2019 Admitting Physician Lequita Halt, MD VZD:GLOVFI, Denton Ar, MD  Brief Narrative: Patient is a 84 y.o. 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 He was seen by his primary oncologist on 6/24-started on Z-Pak-which he completed but developed severe pruritus.  He presented to the ED on 7/3 with chest pain-found to have bilateral infiltrates-and subsequently admitted to the hospitalist service.  Significant events: 7/3>> presented to the ED with chest pain radiating to the neck (similar to his prior MI)-found to have bilateral infiltrates on chest x-ray and admitted to the hospitalist service.  Significant studies: 7/4>> chest x-ray: Persistent bilateral patchy airspace disease 7/3>> chest x-ray: Interval worsening bilateral patchy airspace disease -likely multifocal PNA 6/17>> chest x-ray: Right upper lobe airspace opacity noted concerning for pneumonia 6/14>> chest x-ray: No active cardio pulmonary disease.  Antimicrobial therapy: Rocephin: 7/3>> Flagyl: 7/3>>  Microbiology data: 7/3>> blood culture: Pending 7/3>>SARS Coronavirus 2:neg  Procedures : None  Consults: Cardiology  DVT Prophylaxis : enoxaparin (LOVENOX) injection 40 mg Start: 09/15/19 1130   Subjective: He has no further chest pain-claims that he has had some amount of shortness of breath since mid April-and he has been using his home O2 24/7 (usually only on nocturnal O2).  He denies any fever,  cough.  Assessment/Plan: Chest pain-likely stable angina pectoris-history of CAD s/p PCI in 2011 and 2013: Evaluated by cardiology-recommendations are for supportive care.  Troponins negative.  Remains on aspirin/Plavix/statin/beta-blocker.  Await echocardiogram.  Bilateral lung infiltrates: He really has no symptoms of pneumonia-he presented to the ED with chest pain.  Does have some mild leukocytosis but otherwise appears well.  Patient is s/p radiation earlier this year-just finished a course of Zithromax for RUL PNA-subsequently developed severe pruritus.  Procalcitonin is negative.  I wonder if this represents a noninfectious pneumonitis-reviewed imaging with PCCM- Dr Aura Dials will go ahead and get a HRCT chest to further define lung parenchyma.  Check respiratory virus panel as well.  In the meantime-continue IV antimicrobial therapy.  Follow cultures.  Chronic hypoxic respiratory failure: Per patient-since mid April-he has been using home O2 24/7-prior to that-he has been on O2 only nocturnally.  COPD: No wheezing-not in exacerbation-continue bronchodilator regimen  Stage Ia-nonsmall cell CA of the left upper lobe: S/p radiation in 2018-then completed another round of radiation in January 2021 for enlarging left upper lobe lung nodule.  Recent bronchoscopy/biopsy was negative for malignancy (see above summary)  Hoarseness of voice secondary to partial vocal cord paralysis: Evaluated by Dr. Wilburn Cornelia in April 2021-likely secondary to radiation-recommendations were observation/follow-up.  SLP evaluation completed 7/4-continue regular diet.  Chronic systolic heart failure: Euvolemic on exam.  BPH: Continue Flomax  Diet: Diet Order            Diet regular Room service appropriate? Yes; Fluid consistency: Nectar Thick  Diet effective now  Code Status: Full code   Family Communication: None at bedside  Disposition Plan: Status is: Inpatient  Remains inpatient  appropriate because:Inpatient level of care appropriate due to severity of illness   Dispo: The patient is from: Home              Anticipated d/c is to: Home              Anticipated d/c date is: 3 days              Patient currently is not medically stable to d/c.  Barriers to Discharge: Bilateral lung infiltrates-on IV antibiotics-work-up in progress-see above  Antimicrobial agents: Anti-infectives (From admission, onward)   Start     Dose/Rate Route Frequency Ordered Stop   09/16/19 0000  cefTRIAXone (ROCEPHIN) 2 g in sodium chloride 0.9 % 100 mL IVPB     Discontinue     2 g 200 mL/hr over 30 Minutes Intravenous Every 24 hours 09/15/19 1505     09/15/19 1400  metroNIDAZOLE (FLAGYL) tablet 500 mg     Discontinue     500 mg Oral Every 8 hours 09/15/19 1211     09/15/19 1300  cefTRIAXone (ROCEPHIN) 1 g in sodium chloride 0.9 % 100 mL IVPB  Status:  Discontinued        1 g 200 mL/hr over 30 Minutes Intravenous Every 24 hours 09/15/19 1211 09/15/19 1505   09/15/19 1000  meropenem (MERREM) 1 g in sodium chloride 0.9 % 100 mL IVPB        1 g 200 mL/hr over 30 Minutes Intravenous  Once 09/15/19 0852 09/15/19 1107       Time spent: 25- minutes-Greater than 50% of this time was spent in counseling, explanation of diagnosis, planning of further management, and coordination of care.  MEDICATIONS: Scheduled Meds: . aspirin EC  81 mg Oral Daily  . atorvastatin  20 mg Oral Daily  . clopidogrel  75 mg Oral Daily  . enoxaparin (LOVENOX) injection  40 mg Subcutaneous Q24H  . gabapentin  600 mg Oral TID  . latanoprost  1 drop Both Eyes QHS  . metoprolol succinate  25 mg Oral q morning - 10a  . metroNIDAZOLE  500 mg Oral Q8H  . sucralfate  1 g Oral QID  . tamsulosin  0.4 mg Oral QPM  . timolol  1 drop Both Eyes BID   Continuous Infusions: . cefTRIAXone (ROCEPHIN)  IV 2 g (09/15/19 2215)   PRN Meds:.acetaminophen **OR** acetaminophen, albuterol, guaiFENesin, hydrOXYzine, Resource  ThickenUp Clear   PHYSICAL EXAM: Vital signs: Vitals:   09/15/19 2205 09/16/19 0628 09/16/19 0823 09/16/19 0826  BP: 122/69 (!) 106/93 123/82 118/69  Pulse: 93 98    Resp: 16 19 20  (!) 25  Temp: 98 F (36.7 C) 97.9 F (36.6 C) 98 F (36.7 C) 98.1 F (36.7 C)  TempSrc: Oral Oral Oral Oral  SpO2: 93% 93% 98% 94%  Weight:      Height:       Filed Weights   09/15/19 0741  Weight: 86.2 kg   Body mass index is 28.06 kg/m.   Gen Exam:Alert awake-not in any distress HEENT:atraumatic, normocephalic Chest: B/L clear to auscultation anteriorly CVS:S1S2 regular Abdomen:soft non tender, non distended Extremities:no edema Neurology: Non focal Skin: no rash  I have personally reviewed following labs and imaging studies  LABORATORY DATA: CBC: Recent Labs  Lab 09/15/19 0734 09/16/19 0618  WBC 15.0* 11.6*  HGB 12.8* 12.3*  HCT 41.3 39.0  MCV 93.4 93.1  PLT 198 867    Basic Metabolic Panel: Recent Labs  Lab 09/15/19 0734 09/16/19 0618  NA 140 138  K 4.6 5.0  CL 99 99  CO2 29 28  GLUCOSE 173* 123*  BUN 12 13  CREATININE 1.00 0.87  CALCIUM 9.2 8.7*    GFR: Estimated Creatinine Clearance: 72.4 mL/min (by C-G formula based on SCr of 0.87 mg/dL).  Liver Function Tests: No results for input(s): AST, ALT, ALKPHOS, BILITOT, PROT, ALBUMIN in the last 168 hours. No results for input(s): LIPASE, AMYLASE in the last 168 hours. No results for input(s): AMMONIA in the last 168 hours.  Coagulation Profile: No results for input(s): INR, PROTIME in the last 168 hours.  Cardiac Enzymes: No results for input(s): CKTOTAL, CKMB, CKMBINDEX, TROPONINI in the last 168 hours.  BNP (last 3 results) No results for input(s): PROBNP in the last 8760 hours.  Lipid Profile: No results for input(s): CHOL, HDL, LDLCALC, TRIG, CHOLHDL, LDLDIRECT in the last 72 hours.  Thyroid Function Tests: No results for input(s): TSH, T4TOTAL, FREET4, T3FREE, THYROIDAB in the last 72  hours.  Anemia Panel: No results for input(s): VITAMINB12, FOLATE, FERRITIN, TIBC, IRON, RETICCTPCT in the last 72 hours.  Urine analysis:    Component Value Date/Time   COLORURINE YELLOW 01/16/2008 [place] 01/16/2008 1617   LABSPEC 1.023 01/16/2008 1617   PHURINE 5.5 01/16/2008 1617   GLUCOSEU NEGATIVE 01/16/2008 1617   HGBUR SMALL (A) 01/16/2008 1617   BILIRUBINUR NEGATIVE 01/16/2008 1617   KETONESUR NEGATIVE 01/16/2008 1617   PROTEINUR NEGATIVE 01/16/2008 1617   UROBILINOGEN 0.2 01/16/2008 1617   NITRITE NEGATIVE 01/16/2008 1617   LEUKOCYTESUR NEGATIVE 01/16/2008 1617    Sepsis Labs: Lactic Acid, Venous    Component Value Date/Time   LATICACIDVEN 1.6 09/15/2019 0906    MICROBIOLOGY: Recent Results (from the past 240 hour(s))  SARS Coronavirus 2 by RT PCR (hospital order, performed in [place] hospital lab) Nasopharyngeal Nasopharyngeal Swab     Status: None   Collection Time: 09/15/19 10:32 AM   Specimen: Nasopharyngeal Swab  Result Value Ref Range Status   SARS Coronavirus 2 NEGATIVE NEGATIVE Final    Comment: (NOTE) SARS-CoV-2 target nucleic acids are NOT DETECTED.  The SARS-CoV-2 RNA is generally detectable in upper and lower respiratory specimens during the acute phase of infection. The lowest concentration of SARS-CoV-2 viral copies this assay can detect is 250 copies / mL. A negative result does not preclude SARS-CoV-2 infection and should not be used as the sole basis for treatment or other patient management decisions.  A negative result may occur with improper specimen collection / handling, submission of specimen other than nasopharyngeal swab, presence of viral mutation(s) within the areas targeted by this assay, and inadequate number of viral copies (<250 copies / mL). A negative result must be combined with clinical observations, patient history, and epidemiological information.  Fact Sheet for Patients:    StrictlyIdeas.no  Fact Sheet for Healthcare Providers: BankingDealers.co.za  This test is not yet approved or  cleared by the [place] FDA and has been authorized for detection and/or diagnosis of SARS-CoV-2 by FDA under an Emergency Use Authorization (EUA).  This EUA will remain in effect (meaning this test can be used) for the duration of the COVID-19 declaration under Section 564(b)(1) of the Act, 21 U.S.C. section 360bbb-3(b)(1), unless the authorization is terminated or revoked sooner.  Performed at [facility], [place] [address], [place],  [address]  RADIOLOGY STUDIES/RESULTS: DG Chest 2 View  Result Date: 09/15/2019 CLINICAL DATA:  Chest pain and shortness of breath 4 days. Recent pneumonia. EXAM: CHEST - 2 VIEW COMPARISON:  08/30/2019 FINDINGS: Lungs are somewhat hypoinflated demonstrate interval worsening bilateral patchy hazy airspace process over the mid to lower lungs likely multifocal pneumonia. No evidence of effusion. Cardiomediastinal silhouette and remainder of the exam is unchanged. IMPRESSION: Hypoinflation with interval worsening bilateral patchy airspace process over the mid to lower lungs likely multifocal pneumonia. Electronically Signed   By: Marin Olp M.D.   On: 09/15/2019 08:19   DG Chest Port 1 View  Result Date: 09/16/2019 CLINICAL DATA:  Pneumonia.  History of lung cancer. EXAM: PORTABLE CHEST 1 VIEW COMPARISON:  09/15/2019 and 08/27/2019 FINDINGS: Lungs are hypoinflated demonstrate patchy bilateral airspace opacification with slight interval improved aeration over the left midlung. Findings likely due to multifocal infection. No effusion or pneumothorax. Stable known left apical lung mass likely patient's known lung cancer containing two metallic densities. Cardiomediastinal silhouette and remainder of the exam is unchanged. IMPRESSION: 1. Persistent bilateral patchy airspace process with  slight interval improved aeration over the left midlung likely multifocal infection. 2. Stable known left apical lung mass likely patient's known lung cancer. Electronically Signed   By: Marin Olp M.D.   On: 09/16/2019 09:03   DG Swallowing Func-Speech Pathology  Result Date: 09/16/2019 Objective Swallowing Evaluation: Type of Study: MBS-Modified Barium Swallow Study  Patient Details Name: [name] MRN: [id number] Date of Birth: [birth year] Today's Date: 09/16/2019 Time: SLP Start Time (ACUTE ONLY): 0755 -SLP Stop Time (ACUTE ONLY): 0820 SLP Time Calculation (min) (ACUTE ONLY): 25 min Past Medical History: Past Medical History: Diagnosis Date . AAA (abdominal aortic aneurysm) (HCC)   measures 2.7 cm. . Arthritis  . Back pain  . BPH (benign prostatic hypertrophy)  . Bruises easily  . CAD (coronary artery disease)  . Cervical spine fracture ([facility]) feb 1990  C 6, C 7 and T 1, no surgery done wore halo brace . CLL (chronic lymphocytic leukemia) ([facility])   hx of worked up 2009 by dr Benay Spice, no tx done, released by dr Benay Spice . Complication of anesthesia   cannot lie on right side gets vertigo and trouble turning neck to right . COPD (chronic obstructive pulmonary disease) ([facility]) may 2005 . Erectile dysfunction  . Fatigue  . GERD (gastroesophageal reflux disease)  . Glaucoma  . Hepatitis as child  serum hepatitis . History of oxygen administration   oxygen  @2   l/m nasally at bedtime. Marland Kitchen History of radiation therapy 06/22/16-06/28/16  Left upper lobe of lung/ 54 Gy in 3 fractions . Hyperlipidemia  . Hypertension  . Impaired hearing   wears Hearing aids . Ischemic cardiomyopathy  . Leg pain  . Lumbar disc disease  . Lung cancer ([facility]) dx'd 03/18/16 . Microscopic hematuria  . Myocardial infarct ([facility]) 03-17-2009  Hx MI 2000 . Neuropathy   both feet and legs . Osteopenia  . Pneumonia   hx x2 . Thyroid nodule  Past Surgical History: Past Surgical History: Procedure Laterality Date . APPENDECTOMY  sept, 10, 2005 . BACK SURGERY   2009  L 2 and L 3  . benign cyst removed from left neck  02-2010 . CERVICAL DISC SURGERY  2005-2008  C 2, C 3 and C 4 2005, rods placed C 6, C 7 and T 1 . COLONOSCOPY WITH PROPOFOL N/A 11/26/2014  Procedure: COLONOSCOPY WITH PROPOFOL;  Surgeon: Garlan Fair, MD;  Location: [facility];  Service: Endoscopy;  Laterality: N/A; . CORONARY ANGIOPLASTY WITH STENT PLACEMENT  2013  1 stent repair, one stent replaced . ESOPHAGOGASTRODUODENOSCOPY (EGD) WITH PROPOFOL N/A 11/27/2013  Procedure: ESOPHAGOGASTRODUODENOSCOPY (EGD) WITH PROPOFOL;  Surgeon: Garlan Fair, MD;  Location: [facility] ENDOS[facility];  Service: Endoscopy;  Laterality: N/A; . EYE SURGERY Bilateral july 2011  eye surgry for glaucoma . FUDUCIAL PLACEMENT N/A 05/12/2016  Procedure: PLACEMENT OF FUDUCIAL;  Surgeon: Melrose Nakayama, MD;  Location: [facility];  Service: Thoracic;  Laterality: N/A; . fusion L 3 and L 4  August 15, 2008 . LEFT HEART CATHETERIZATION WITH CORONARY ANGIOGRAM N/A 08/12/2011  Procedure: LEFT HEART CATHETERIZATION WITH CORONARY ANGIOGRAM;  Surgeon: Larey Dresser, MD;  Location: [facility] CATH LAB;  Service: Cardiovascular;  Laterality: N/A; . NASAL SINUS SURGERY   oct 2005 . right knee arthroscopy  1981 . stent top heart  2011  x 2 . TONSILLECTOMY  1943 . VIDEO BRONCHOSCOPY WITH ENDOBRONCHIAL NAVIGATION N/A 05/12/2016  Procedure: VIDEO BRONCHOSCOPY WITH ENDOBRONCHIAL NAVIGATION;  Surgeon: Melrose Nakayama, MD;  Location: [facility];  Service: Thoracic;  Laterality: N/A; . VIDEO BRONCHOSCOPY WITH ENDOBRONCHIAL NAVIGATION N/A 08/30/2019  Procedure: VIDEO BRONCHOSCOPY WITH ENDOBRONCHIAL NAVIGATION;  Surgeon: Melrose Nakayama, MD;  Location: [facility];  Service: Thoracic;  Laterality: N/A; . VIDEO BRONCHOSCOPY WITH ENDOBRONCHIAL ULTRASOUND N/A 08/30/2019  Procedure: VIDEO BRONCHOSCOPY WITH ENDOBRONCHIAL ULTRASOUND;  Surgeon: Melrose Nakayama, MD;  Location: [facility];  Service: Thoracic;  Laterality: N/A; HPI: 84 y.o. male with medical history significant of  left-sided lung adenocarcinoma status post radiation therapy in Dec-Jan/2020-2021, chronic hoarseness with one-sided vocal cord paralysis, CAD with stents x3, sleep apnea on at bedtime CPAP oxygen 2 L, HTN, HLD, presented with worsening short of breath and chest pain this morning. PET scan on 5/21 shows "Hypermetabolism noted right vocal cord. There is some medial deviation of the posterior left focal cord. 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 Pt reports he often gets choked with liquids, particulalry if he drinks quickly. He uses a positional strategy of deep bend at his torso with a head turn left to divert the bolus down the right side of his pharynx. Under fluoroscopy with head in neutral with initial sips, pt took careful single sips of thin with a slight oral hold with no aspiration. When instructed to drink consecutively he had severe aspiraiton during the swallow with insufficient dysphonic cough to clear. Cough seemed more sensitive in the trachea than the vestibule or glottis as pt did not always have a reflexive cough with frank penetration or trace aspiration just below the VF. When small single sips of thin were repeated in neutral pt had ongoing aspiration and prior success could not be repeated. Pt reduced quantity of aspiration to trace high aspiration with a head turn left and tilt right (deep bend not needed) and with a cUED throat clear, ejected aspiration. Nectar thick liquids could be consumed without positional strategies resulting in a coating on the cords. Pt has mild residue with all textures that he clears with second swallow.  Recommend pt primarily consume nectar thick liquids, but can have sips of thin with positional strategies. Needs education on thickening, pulmonary  hygiene, oral care and f/u with ENT and OP SLP to address voice and swallowing needs.  SLP Visit Diagnosis Dysphagia, oropharyngeal phase (R13.12) Attention and concentration deficit following -- Frontal lobe and executive function deficit following -- Impact on safety and function Severe aspiration risk;Risk for inadequate nutrition/hydration   CHL IP TREATMENT RECOMMENDATION 09/16/2019 Treatment Recommendations Therapy as outlined in treatment plan below   Prognosis 09/16/2019 Prognosis for Safe Diet Advancement Good Barriers to Reach Goals -- Barriers/Prognosis Comment -- CHL IP DIET RECOMMENDATION 09/16/2019 SLP Diet Recommendations Regular solids;Nectar thick liquid;Thin liquid Liquid Administration via Cup Medication Administration Whole meds with puree Compensations Slow rate;Small sips/bites;Clear throat intermittently;Multiple dry swallows after each bite/sip Postural Changes Seated upright at 90 degrees   CHL IP OTHER RECOMMENDATIONS 09/16/2019 Recommended Consults -- Oral Care Recommendations Oral care QID Other Recommendations Have oral suction available   CHL IP FOLLOW UP RECOMMENDATIONS 09/16/2019 Follow up Recommendations Outpatient SLP   CHL IP FREQUENCY AND DURATION 09/16/2019 Speech Therapy Frequency (ACUTE ONLY) min 2x/week Treatment Duration 2 weeks      CHL IP ORAL PHASE 09/16/2019 Oral Phase WFL Oral - Pudding Teaspoon -- Oral - Pudding Cup -- Oral - Honey Teaspoon -- Oral - Honey Cup -- Oral - Nectar Teaspoon -- Oral - Nectar Cup -- Oral - Nectar Straw -- Oral - Thin Teaspoon -- Oral - Thin Cup -- Oral - Thin Straw -- Oral - Puree -- Oral - Mech Soft -- Oral - Regular -- Oral - Multi-Consistency -- Oral - Pill -- Oral Phase - Comment --  CHL IP PHARYNGEAL PHASE 09/16/2019 Pharyngeal Phase Impaired Pharyngeal- Pudding Teaspoon -- Pharyngeal -- Pharyngeal- Pudding  Cup -- Pharyngeal -- Pharyngeal- Honey Teaspoon -- Pharyngeal -- Pharyngeal- Honey Cup -- Pharyngeal -- Pharyngeal- Nectar Teaspoon -- Pharyngeal -- Pharyngeal- Nectar Cup Reduced airway/laryngeal closure;Penetration/Aspiration during swallow;Trace aspiration Pharyngeal Material enters airway, passes BELOW cords then ejected out;Material does not enter airway Pharyngeal- Nectar Straw -- Pharyngeal -- Pharyngeal- Thin Teaspoon -- Pharyngeal -- Pharyngeal- Thin Cup Penetration/Aspiration during swallow;Significant aspiration (Amount);Reduced airway/laryngeal closure Pharyngeal Material enters airway, passes BELOW cords without attempt by patient to eject out (silent aspiration);Material enters airway, passes BELOW cords and not ejected out despite cough attempt by patient;Material does not enter airway Pharyngeal- Thin Straw -- Pharyngeal -- Pharyngeal- Puree WFL Pharyngeal -- Pharyngeal- Mechanical Soft -- Pharyngeal -- Pharyngeal- Regular WFL Pharyngeal -- Pharyngeal- Multi-consistency -- Pharyngeal -- Pharyngeal- Pill -- Pharyngeal -- Pharyngeal Comment --  No flowsheet data found. De[place], Katherene Ponto 09/16/2019, 9:02 AM              ECHOCARDIOGRAM COMPLETE  Result Date: 09/15/2019    ECHOCARDIOGRAM REPORT   Patient Name:   [name] Date of Exam: 09/15/2019 Medical Rec #:  [id number]     Height:       69.0 in Accession #:    [id number]    Weight:       190.0 lb Date of Birth:  1936-01-13    BSA:          2.021 m Patient Age:    39 years      BP:           105/81 mmHg Patient Gender: M             HR:           81 bpm. Exam Location:  Inpatient Procedure: 2D Echo Indications:     chest pain 786.50  History:  Patient has prior history of Echocardiogram examinations, most                  recent 03/23/2016. Cardiomyopathy, CAD, COPD and abdominal aortic                  aneurysm. Pneumonia; Risk Factors:Sleep Apnea and Dyslipidemia.  Sonographer:     [place] Referring Phys:  [id number] Lequita Halt Diagnosing Phys: Adrian Prows MD IMPRESSIONS  1. Left ventricular ejection fraction, by estimation, is 40 to 45%. Left ventricular ejection fraction by 2D MOD biplane is 35.4 %. The left ventricle has mildly decreased function. The left ventricle demonstrates regional wall motion abnormalities (see  scoring diagram/findings for description). Left ventricular diastolic parameters are consistent with Grade I diastolic dysfunction (impaired relaxation). There is mild hypokinesis of the left ventricular, entire inferolateral wall.  2. Right ventricular systolic function is normal. The right ventricular size is normal.  3. The mitral valve is normal in structure. No evidence of mitral valve regurgitation. No evidence of mitral stenosis.  4. The aortic valve is normal in structure. Aortic valve regurgitation is not visualized. No aortic stenosis is present.  5. The inferior vena cava is dilated in size with >50% respiratory variability, suggesting right atrial pressure of 8 mmHg. FINDINGS  Left Ventricle: Left ventricular ejection fraction, by estimation, is 40 to 45%. Left ventricular ejection fraction by 2D MOD biplane is 35.4 %. The left ventricle has mildly decreased function. The left ventricle demonstrates regional wall motion abnormalities. Mild hypokinesis of the left ventricular, entire inferolateral wall. The left ventricular internal cavity size was normal in size. There is no left ventricular hypertrophy. Left ventricular diastolic parameters are consistent with Grade I diastolic dysfunction (impaired relaxation). Normal left ventricular filling pressure. Right Ventricle: The right ventricular size is normal. No increase in right ventricular wall thickness. Right ventricular systolic function is normal. Left Atrium: Left atrial size was normal in size. Right Atrium: Right atrial size was normal in size. Pericardium: There is no evidence of pericardial effusion. Mitral Valve: The mitral valve is normal in  structure. Normal mobility of the mitral valve leaflets. No evidence of mitral valve regurgitation. No evidence of mitral valve stenosis. Tricuspid Valve: The tricuspid valve is normal in structure. Tricuspid valve regurgitation is not demonstrated. No evidence of tricuspid stenosis. Aortic Valve: The aortic valve is normal in structure. Aortic valve regurgitation is not visualized. No aortic stenosis is present. Pulmonic Valve: The pulmonic valve was not well visualized. Pulmonic valve regurgitation is not visualized. Aorta: The aortic root is normal in size and structure. Venous: The inferior vena cava is dilated in size with greater than 50% respiratory variability, suggesting right atrial pressure of 8 mmHg. IAS/Shunts: No atrial level shunt detected by color flow Doppler.  LEFT VENTRICLE PLAX 2D                        Biplane EF (MOD) LVIDd:         5.00 cm         LV Biplane EF:   Left LVIDs:         4.20 cm                          ventricular LV PW:         1.00 cm  ejection LV IVS:        1.00 cm                          fraction by LVOT diam:     2.30 cm                          2D MOD LV SV:         68                               biplane is LV SV Index:   34                               35.4 %. LVOT Area:     4.15 cm                                Diastology                                LV e' lateral:   7.40 cm/s LV Volumes (MOD)               LV E/e' lateral: 8.6 LV vol d, MOD    64.3 ml       LV e' medial:    6.53 cm/s A2C:                           LV E/e' medial:  9.7 LV vol d, MOD    67.6 ml A4C: LV vol s, MOD    45.0 ml A2C: LV vol s, MOD    38.8 ml A4C: LV SV MOD A2C:   19.3 ml LV SV MOD A4C:   67.6 ml LV SV MOD BP:    23.2 ml RIGHT VENTRICLE         IVC TAPSE (M-mode): 1.4 cm  IVC diam: 2.30 cm LEFT ATRIUM             Index       RIGHT ATRIUM           Index LA diam:        2.90 cm 1.43 cm/m  RA Area:     13.50 cm LA Vol (A2C):   52.0 ml 25.72 ml/m RA Volume:    28.30 ml  14.00 ml/m LA Vol (A4C):   38.0 ml 18.80 ml/m LA Biplane Vol: 48.4 ml 23.94 ml/m  AORTIC VALVE LVOT Vmax:   90.00 cm/s LVOT Vmean:  60.400 cm/s LVOT VTI:    0.163 m  AORTA Ao Root diam: 3.70 cm Ao Asc diam:  3.20 cm MITRAL VALVE MV Area (PHT): 3.77 cm     SHUNTS MV Decel Time: 201 msec     Systemic VTI:  0.16 m MV E velocity: 63.40 cm/s   Systemic Diam: 2.30 cm MV A velocity: 109.00 cm/s MV E/A ratio:  0.58 Adrian Prows MD Electronically signed by Adrian Prows MD Signature Date/Time: 09/15/2019/5:20:15 PM    Final      LOS: 1 day   Oren Binet, MD  [facility]    To contact the attending provider between 7A-7P or  the covering provider during after hours 7P-7A, please log into the web site [email or web address] and access using universal [place] password for that web site. If you do not have the password, please call the hospital operator.  09/16/2019, 9:51 AM

## 2019-09-17 LAB — BASIC METABOLIC PANEL
Anion gap: 8 (ref 5–15)
BUN: 14 mg/dL (ref 8–23)
CO2: 31 mmol/L (ref 22–32)
Calcium: 8.6 mg/dL — ABNORMAL LOW (ref 8.9–10.3)
Chloride: 97 mmol/L — ABNORMAL LOW (ref 98–111)
Creatinine, Ser: 0.88 mg/dL (ref 0.61–1.24)
GFR calc Af Amer: >60 mL/min (ref >60)
GFR calc non Af Amer: >60 mL/min (ref >60)
Glucose, Bld: 137 mg/dL — ABNORMAL HIGH (ref 70–99)
Potassium: 4.3 mmol/L (ref 3.5–5.1)
Sodium: 136 mmol/L (ref 135–145)

## 2019-09-17 LAB — CBC
HCT: 36.1 % — ABNORMAL LOW (ref 39.0–52.0)
Hemoglobin: 11.3 g/dL — ABNORMAL LOW (ref 13.0–17.0)
MCH: 29 pg (ref 26.0–34.0)
MCHC: 31.3 g/dL (ref 30.0–36.0)
MCV: 92.6 fL (ref 80.0–100.0)
Platelets: 181 10*3/uL (ref 150–400)
RBC: 3.9 MIL/uL — ABNORMAL LOW (ref 4.22–5.81)
RDW: 13.8 % (ref 11.5–15.5)
WBC: 11.6 10*3/uL — ABNORMAL HIGH (ref 4.0–10.5)
nRBC: 0 % (ref 0.0–0.2)

## 2019-09-17 LAB — PROCALCITONIN: Procalcitonin: <0.1 ng/mL

## 2019-09-17 LAB — TROPONIN I (HIGH SENSITIVITY): Troponin I (High Sensitivity): 4 ng/L (ref <18)

## 2019-09-17 MED ORDER — NITROGLYCERIN 0.4 MG SL SUBL: 0.4000 mg | SUBLINGUAL_TABLET | SUBLINGUAL | Status: DC | PRN

## 2019-09-17 MED ORDER — RANOLAZINE ER 500 MG PO TB12
500.0000 mg | ORAL_TABLET | Freq: Two times a day (BID) | ORAL | Status: DC
  Administered 2019-09-17 – 2019-09-18 (×3): 500 mg via ORAL
  Filled 2019-09-17 (×4): qty 1

## 2019-09-17 MED ORDER — ISOSORBIDE MONONITRATE ER 60 MG PO TB24
60.0000 mg | ORAL_TABLET | Freq: Every day | ORAL | Status: DC
  Administered 2019-09-17: 60 mg via ORAL
  Filled 2019-09-17: qty 1

## 2019-09-17 NOTE — Progress Notes (Signed)
Floor coverage  Patient with history of CAD status post PCI in 2011 and 2013 admitted for chest pain felt to be secondary to stable angina.  He was seen by cardiology on 7/3 and recommendation give sublingual nitroglycerin as needed for chest pain and start Plavix.  Start isosorbide mononitrate 60 mg daily if there is recurrence of angina.  Paged by nursing staff as patient complained of chest pain.  Patient was seen and examined at bedside.  Sleeping comfortably when I entered the room.  Reported having an episode of left-sided/substernal chest pain 45 minutes ago which lasted about 15 minutes.  No associated dyspnea, diaphoresis, nausea.  Chest pain has now resolved.  No other complaints.  Vital signs stable.  Exam: Heart: RRR, no m/r/g Lungs: Equal air entry bilaterally.  No wheezes, rales, or rhonchi.  Abdomen: Soft, bowel sounds present, nontender. Extremities: No lower extremity edema.  EKG was done and did not show any acute ischemic changes.  Assessment/plan: Chest pain likely due to stable angina pectoris as EKG without acute ischemic changes.  Currently chest pain-free and resting comfortably. -Continue cardiac monitoring -Check high-sensitivity troponin level -Sublingual nitroglycerin as needed -Start isosorbide mononitrate 60 mg daily -Continue aspirin, Plavix, Lipitor, metoprolol

## 2019-09-17 NOTE — Progress Notes (Signed)
Patient ambulated in hallway on room air.  His oxygen sats dropped to 82%, but no complaints of chest pain.  Patient states that he has oxygen at home and usually knows when he needs to wear 2 liters during the day.  Will ambulate patient again later today.

## 2019-09-17 NOTE — Progress Notes (Signed)
[facility] notifiedbp 90/60 manual asymptomatic Arthor Captain LPN

## 2019-09-17 NOTE — Progress Notes (Signed)
Pt woke up from sleep complaining of chest pain. Vital signs were performed and ECG Stat done. Paged Omar Feinstein MD via Shea Evans. Md came to see pt at bedside and pt said he is not experiencing chest pain and resting now.

## 2019-09-17 NOTE — Progress Notes (Signed)
Initial Nutrition Assessment  DOCUMENTATION CODES:   Non-severe (moderate) malnutrition in context of chronic illness  INTERVENTION:    Magic cup TID with meals, each supplement provides 290 kcal and 9 grams of protein  MVI daily   NUTRITION DIAGNOSIS:   Moderate Malnutrition related to chronic illness, cancer and cancer related treatments as evidenced by mild fat depletion, moderate muscle depletion.  GOAL:   Patient will meet greater than or equal to 90% of their needs  MONITOR:   PO intake, Supplement acceptance, Weight trends, Labs, I & O's  REASON FOR ASSESSMENT:   Malnutrition Screening Tool    ASSESSMENT:   Patient with PMH significant for L lung adenocarcinoma s/p radiation therapy (Dec 2020-Jan 2021), one-sided vocal cord paralysis, CAD with stents x3, CHF, HTN, and HLD. Presents this admission with aspiration PNA.   Pt denies loss of appetite PTA. States he typically eats two meals daily that consist of B-oatmeal and D-meat, vegetable, grain. Does not use supplementation. States he has ongoing swallowing issues and can consume thin liquids but has to tilt his head to the right to prevent choking. Encouraged pt to stick to thickened liquids as recommended by SLP. No meal intakes documented. Lunch tray with pizza at bedside. RD to order supplementation to maximize kcal and protein this admission.   Pt endorses a UBW of 190 lb and is unsure of recent wt loss. Records indicate pt weighed 207 lb on 10/03/2018 and show a stated weight of 190 lb this admission.   Medications: reviewed  Labs: reviewed   NUTRITION - FOCUSED PHYSICAL EXAM:    Most Recent Value  Orbital Region No depletion  Upper Arm Region Mild depletion  Thoracic and Lumbar Region Unable to assess  Buccal Region Mild depletion  Temple Region Mild depletion  Clavicle Bone Region Mild depletion  Clavicle and Acromion Bone Region Mild depletion  Scapular Bone Region Unable to assess  Dorsal Hand  Moderate depletion  Patellar Region Moderate depletion  Anterior Thigh Region Moderate depletion  Posterior Calf Region Moderate depletion  Edema (RD Assessment) None  Hair Reviewed  Eyes Reviewed  Mouth Reviewed  Skin Reviewed  Nails Reviewed     Diet Order:   Diet Order            Diet regular Room service appropriate? Yes; Fluid consistency: Nectar Thick  Diet effective now                 EDUCATION NEEDS:   Not appropriate for education at this time  Skin:  Skin Assessment: Reviewed RN Assessment  Last BM:  7/2  Height:   Ht Readings from Last 1 Encounters:  09/15/19 5\' 9"  (1.753 m)    Weight:   Wt Readings from Last 1 Encounters:  09/15/19 86.2 kg    BMI:  Body mass index is 28.06 kg/m.  Estimated Nutritional Needs:   Kcal:  2200-2400 kcal  Protein:  110-130 grams  Fluid:  >/= 2.2 L/day   Mariana Single RD, LDN Clinical Nutrition Pager listed in [place]

## 2019-09-17 NOTE — Progress Notes (Addendum)
PROGRESS NOTE        PATIENT DETAILS Name: [name] Age: 84 y.o. Sex: male Date of Birth: 1935-08-06 Admit Date: 09/15/2019 Admitting Physician Lequita Halt, MD [id number], D[place] Ar, MD  Brief Narrative: Patient is a 84 y.o. 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 He was seen by his primary oncologist on 6/24-started on Z-Pak-which he completed but developed severe pruritus.  He presented to the ED on 7/3 with chest pain-found to have bilateral infiltrates-and subsequently admitted to the hospitalist service.  Significant events: 7/3>> presented to the ED with chest pain radiating to the neck (similar to his prior MI)-found to have bilateral infiltrates on chest x-ray and admitted to the hospitalist service.  Significant studies: 7/4>> chest x-ray: Persistent bilateral patchy airspace disease 7/3>> chest x-ray: Interval worsening bilateral patchy airspace disease -likely multifocal PNA 6/17>> chest x-ray: Right upper lobe airspace opacity noted concerning for pneumonia 6/14>> chest x-ray: No active cardio pulmonary disease.  Antimicrobial therapy: Rocephin: 7/3>> Flagyl: 7/3>>  Microbiology data: 7/3>> blood culture: Pending 7/3>>SARS Coronavirus 2:neg  Procedures : None  Consults: Cardiology  DVT Prophylaxis : enoxaparin (LOVENOX) injection 40 mg Start: 09/15/19 1130   Subjective:  Patient in bed, appears comfortable, denies any headache, no fever, no chest pain or pressure, no shortness of breath , no abdominal pain. No focal weakness.  Assessment/Plan:  Chest pain-likely  stable angina pectoris-history of CAD s/p PCI in 2011 and 2013: Evaluated by Dr. Einar Gip patient's primary cardiologist, he had another episode of chest pain at rest last night on 09/16/2018, already on maximum treatment with aspirin, Plavix, statin, Imdur and beta-blocker.  Discussed the case and echo findings with Dr. Einar Gip on 09/17/2019, will add Ranexa and monitor.  Echo reviewed with a EF of 40% along with left ventricular wall motion abnormality and hypokinesis.  Bilateral lung infiltrates: He really has no symptoms of pneumonia-he presented to the ED with chest pain.  Does have some mild leukocytosis but otherwise appears well.  Patient is s/p radiation earlier this year-just finished a course of Zithromax for RUL PNA-subsequently developed severe pruritus.  Procalcitonin is negative.  I wonder if this represents a noninfectious pneumonitis-reviewed imaging with PCCM- Dr Aura Dials will go ahead and get a HRCT chest to further define lung parenchyma.  Check respiratory virus panel as well.  In the meantime-continue IV antimicrobial therapy.  Follow cultures.  Chronic hypoxic respiratory failure: Per patient-since mid April-he has been using home O2 24/7-prior to that-he has been on O2 only nocturnally.  COPD: No wheezing-not in exacerbation-continue bronchodilator regimen  Stage Ia-nonsmall cell CA of the left upper lobe: S/p radiation in 2018-then completed another round of radiation in January 2021 for enlarging left upper lobe lung nodule.  Recent bronchoscopy/biopsy was negative for malignancy (see above summary)  Hoarseness of voice secondary to partial vocal cord paralysis: Evaluated by Dr. Wilburn Cornelia in April 2021-likely secondary to radiation-recommendations were observation/follow-up.  SLP evaluation completed 7/4-continue regular diet.  Chronic systolic heart failure: Euvolemic on exam.  EF around 40%.  No ACE/ARB or Entresto due to allergy.    BPH: Continue Flomax  Diet: Diet Order             Diet regular Room service appropriate? Yes; Fluid consistency: Nectar Thick  Diet effective now                  Code Status: Full code   Family Communication: None at bedside  Disposition Plan: Status is: Inpatient  Remains inpatient appropriate because:Inpatient level of care appropriate due to severity of illness   Dispo: The patient is from: Home              Anticipated d/c is to: Home              Anticipated d/c date is: 3 days              Patient currently is not medically stable to d/c.  Barriers to Discharge: Bilateral lung infiltrates-on IV antibiotics-work-up in progress-see above  Antimicrobial agents: Anti-infectives (From admission, onward)   Start     Dose/Rate Route Frequency Ordered Stop   09/16/19 0000  cefTRIAXone (ROCEPHIN) 2 g in sodium chloride 0.9 % 100 mL IVPB     Discontinue     2 g 200 mL/hr over 30 Minutes Intravenous Every 24 hours 09/15/19 1505     09/15/19 1400  metroNIDAZOLE (FLAGYL) tablet 500 mg     Discontinue     500 mg Oral Every 8 hours 09/15/19 1211     09/15/19 1300  cefTRIAXone (ROCEPHIN) 1 g in sodium chloride 0.9 % 100 mL IVPB  Status:  Discontinued        1 g 200 mL/hr over 30 Minutes Intravenous Every 24 hours 09/15/19 1211 09/15/19 1505   09/15/19 1000  meropenem (MERREM) 1 g in sodium chloride 0.9 % 100 mL IVPB        1 g 200 mL/hr over 30 Minutes Intravenous  Once 09/15/19 0852 09/15/19 1107       Time spent: 25- minutes-Greater than 50% of this time was spent in counseling, explanation of diagnosis, planning of further management, and coordination of care.  MEDICATIONS: Scheduled Meds: . aspirin EC  81 mg Oral Daily  . atorvastatin  20 mg Oral Daily  . clopidogrel  75 mg Oral Daily  . enoxaparin (LOVENOX) injection  40 mg Subcutaneous Q24H  . gabapentin  600 mg Oral TID  . isosorbide mononitrate  60 mg Oral Daily  . latanoprost  1 drop Both Eyes QHS  . metoprolol succinate  25 mg Oral q morning - 10a  .  metroNIDAZOLE  500 mg Oral Q8H  . ranolazine  500 mg Oral BID  . sucralfate  1 g Oral QID  . tamsulosin  0.4 mg Oral QPM  . timolol  1 drop Both Eyes BID   Continuous Infusions: . cefTRIAXone (ROCEPHIN)  IV 2 g (09/16/19 2138)   PRN Meds:.acetaminophen **OR** [DISCONTINUED] acetaminophen, albuterol, guaiFENesin, hydrOXYzine, nitroGLYCERIN, Resource ThickenUp Clear   PHYSICAL EXAM: Vital signs: Vitals:   09/16/19 0826 09/16/19 1614 09/16/19 1929 09/17/19 0024  BP: 118/69 102/70 120/73 124/73  Pulse:  84 87 69  Resp: (!) 25 19 18 19   Temp: 98.1 F (36.7 C) 98.2 F (36.8 C) 98.2 F (36.8 C) 97.8 F (36.6 C)  TempSrc: Oral  Oral Oral  SpO2: 94% 92% 96% 97%  Weight:      Height:       Filed Weights   09/15/19 0741  Weight: 86.2 kg   Body mass index is 28.06 kg/m.   Gen Exam:  Awake Alert, No new F.N deficits, Normal affect [place].AT,PERRAL Supple Neck,No JVD, No cervical lymphadenopathy appriciated.  Symmetrical Chest wall movement, Good air movement bilaterally, CTAB RRR,No Gallops, Rubs or new Murmurs, No Parasternal Heave +ve B.Sounds, Abd Soft, No tenderness, No organomegaly appriciated, No rebound - guarding or rigidity. No Cyanosis, Clubbing or edema, No new Rash or bruise   I have personally reviewed following labs and imaging studies  LABORATORY DATA: CBC: Recent Labs  Lab 09/15/19 0734 09/16/19 0618 09/17/19 0109  WBC 15.0* 11.6* 11.6*  HGB 12.8* 12.3* 11.3*  HCT 41.3 39.0 36.1*  MCV 93.4 93.1 92.6  PLT 198 201 536    Basic Metabolic Panel: Recent Labs  Lab 09/15/19 0734 09/16/19 0618 09/17/19 0109  NA 140 138 136  K 4.6 5.0 4.3  CL 99 99 97*  CO2 29 28 31   GLUCOSE 173* 123* 137*  BUN 12 13 14   CREATININE 1.00 0.87 0.88  CALCIUM 9.2 8.7* 8.6*    GFR: Estimated Creatinine Clearance: 71.6 mL/min (by C-G formula based on SCr of 0.88 mg/dL).  Liver Function Tests: No results for input(s): AST, ALT, ALKPHOS, BILITOT, PROT, ALBUMIN in the  last 168 hours. No results for input(s): LIPASE, AMYLASE in the last 168 hours. No results for input(s): AMMONIA in the last 168 hours.  Coagulation Profile: No results for input(s): INR, PROTIME in the last 168 hours.  Cardiac Enzymes: No results for input(s): CKTOTAL, CKMB, CKMBINDEX, TROPONINI in the last 168 hours.  BNP (last 3 results) No results for input(s): PROBNP in the last 8760 hours.  Lipid Profile: No results for input(s): CHOL, HDL, LDLCALC, TRIG, CHOLHDL, LDLDIRECT in the last 72 hours.  Thyroid Function Tests: No results for input(s): TSH, T4TOTAL, FREET4, T3FREE, THYROIDAB in the last 72 hours.  Anemia Panel: No results for input(s): VITAMINB12, FOLATE, FERRITIN, TIBC, IRON, RETICCTPCT in the last 72 hours.  Urine analysis:    Component Value Date/Time   COLORURINE YELLOW 01/16/2008 [place] 01/16/2008 1617   LABSPEC 1.023 01/16/2008 1617   PHURINE 5.5 01/16/2008 1617   GLUCOSEU NEGATIVE 01/16/2008 1617   HGBUR SMALL (A) 01/16/2008 1617   BILIRUBINUR NEGATIVE 01/16/2008 1617   KETONESUR NEGATIVE 01/16/2008 1617   PROTEINUR NEGATIVE 01/16/2008 1617   UROBILINOGEN 0.2 01/16/2008 1617   NITRITE NEGATIVE 01/16/2008 1617   LEUKOCYTESUR NEGATIVE 01/16/2008 1617    Sepsis Labs: Lactic Acid, Venous    Component Value Date/Time   LATICACIDVEN 1.6 09/15/2019 0906    MICROBIOLOGY: Recent Results (from the past 240 hour(s))  Culture, blood (routine x 2)     Status: None (Preliminary result)   Collection Time: 09/15/19  9:05 AM   Specimen: BLOOD RIGHT HAND  Result Value Ref Range Status   Specimen Description BLOOD RIGHT HAND  Final   Special Requests   Final    BOTTLES DRAWN AEROBIC AND ANAEROBIC Blood Culture adequate volume   Culture   Final    NO GROWTH 1 DAY Performed at [facility], [place] [address] [place], [place] [address]    Report Status PENDING  Incomplete  Culture, blood (routine x 2)     Status: None (Preliminary  result)   Collection Time: 09/15/19  9:10 AM   Specimen: BLOOD LEFT HAND  Result Value Ref Range Status   Specimen Description BLOOD LEFT HAND  Final   Special Requests   Final  BOTTLES DRAWN AEROBIC AND ANAEROBIC Blood Culture results may not be optimal due to an inadequate volume of blood received in culture bottles   Culture   Final    NO GROWTH 1 DAY Performed at [facility] [address] [place], [place] [address]    Report Status PENDING  Incomplete  SARS Coronavirus 2 by RT PCR (hospital order, performed in [facility] hospital lab) Nasopharyngeal Nasopharyngeal Swab     Status: None   Collection Time: 09/15/19 10:32 AM   Specimen: Nasopharyngeal Swab  Result Value Ref Range Status   SARS Coronavirus 2 NEGATIVE NEGATIVE Final    Comment: (NOTE) SARS-CoV-2 target nucleic acids are NOT DETECTED.  The SARS-CoV-2 RNA is generally detectable in upper and lower respiratory specimens during the acute phase of infection. The lowest concentration of SARS-CoV-2 viral copies this assay can detect is 250 copies / mL. A negative result does not preclude SARS-CoV-2 infection and should not be used as the sole basis for treatment or other patient management decisions.  A negative result may occur with improper specimen collection / handling, submission of specimen other than nasopharyngeal swab, presence of viral mutation(s) within the areas targeted by this assay, and inadequate number of viral copies (<250 copies / mL). A negative result must be combined with clinical observations, patient history, and epidemiological information.  Fact Sheet for Patients:   StrictlyIdeas.no  Fact Sheet for Healthcare Providers: BankingDealers.co.za  This test is not yet approved or  cleared by the [place] FDA and has been authorized for detection and/or diagnosis of SARS-CoV-2 by FDA under an Emergency Use Authorization (EUA).  This  EUA will remain in effect (meaning this test can be used) for the duration of the COVID-19 declaration under Section 564(b)(1) of the Act, 21 U.S.C. section 360bbb-3(b)(1), unless the authorization is terminated or revoked sooner.  Performed at [facility], [place] [address] [place], [place] [address]     RADIOLOGY STUDIES/RESULTS: CT Chest High Resolution  Result Date: 09/16/2019 CLINICAL DATA:  Shortness of breath, history of lung cancer EXAM: CT CHEST WITHOUT CONTRAST TECHNIQUE: Multidetector CT imaging of the chest was performed following the standard protocol without intravenous contrast. High resolution imaging of the lungs, as well as inspiratory and expiratory imaging, was performed. COMPARISON:  08/29/2019 FINDINGS: Cardiovascular: Aortic atherosclerosis. Enlargement of the descending thoracic aorta measuring 3.4 x 3.4 cm. Normal heart size. Extensive 3 vessel coronary artery calcifications and/or stents. No pericardial effusion. Enlargement of the main pulmonary artery up to 3.6 cm. Mediastinum/Nodes: Prominent mediastinal lymph nodes, unchanged. Right lobe thyroid nodule described on prior examination is not well appreciated on current examination. Trachea, and esophagus demonstrate no significant findings. Lungs/Pleura: Unchanged post treatment consolidation of the medial apical left upper lobe containing fiducial markers (series 6, image 38). Moderate centrilobular emphysema. There is bilateral, somewhat bandlike heterogeneous airspace opacity and consolidation most conspicuously involving the right middle lobe and peripheral upper lobes (series 6, image 78, 98). Unchanged subpleural nodule of the right lung base measuring 1.6 x 0.8 cm. No pleural effusion or pneumothorax. Upper Abdomen: No acute abnormality. Musculoskeletal: No chest wall mass or suspicious bone lesions identified. IMPRESSION: 1. There is bilateral, somewhat bandlike heterogeneous airspace opacity and consolidation  most conspicuously involving the right middle lobe and peripheral upper lobes. Findings are consistent with multifocal infection and/or aspiration. 2. Unchanged post treatment consolidation of the medial apical left upper lobe containing fiducial markers in keeping with known lung malignancy. 3. Unchanged subpleural  nodule of the right lung base measuring 1.6 x 0.8 cm. 4. Unchanged prominent mediastinal lymph nodes. 5. Emphysema (ICD10-J43.9). 6. Coronary artery disease. 7. Enlargement of the main pulmonary artery, as can be seen in pulmonary hypertension. 8. Enlargement of the descending thoracic aorta measuring 3.4 x 3.4 cm. Aortic Atherosclerosis (ICD10-I70.0). Aortic aneurysm NOS (ICD10-I71.9). Electronically Signed   By: Eddie Candle M.D.   On: 09/16/2019 15:27   DG Chest Port 1 View  Result Date: 09/16/2019 CLINICAL DATA:  Pneumonia.  History of lung cancer. EXAM: PORTABLE CHEST 1 VIEW COMPARISON:  09/15/2019 and 08/27/2019 FINDINGS: Lungs are hypoinflated demonstrate patchy bilateral airspace opacification with slight interval improved aeration over the left midlung. Findings likely due to multifocal infection. No effusion or pneumothorax. Stable known left apical lung mass likely patient's known lung cancer containing two metallic densities. Cardiomediastinal silhouette and remainder of the exam is unchanged. IMPRESSION: 1. Persistent bilateral patchy airspace process with slight interval improved aeration over the left midlung likely multifocal infection. 2. Stable known left apical lung mass likely patient's known lung cancer. Electronically Signed   By: Marin Olp M.D.   On: 09/16/2019 09:03   DG Swallowing Func-Speech Pathology  Result Date: 09/16/2019 Objective Swallowing Evaluation: Type of Study: MBS-Modified Barium Swallow Study  Patient Details Name: CORDARRO SPINNATO MRN: [id number] Date of Birth: 21-Dec-1935 Today's Date: 09/16/2019 Time: SLP Start Time (ACUTE ONLY): 0755 -SLP Stop Time (ACUTE  ONLY): 0820 SLP Time Calculation (min) (ACUTE ONLY): 25 min Past Medical History: Past Medical History: Diagnosis Date . AAA (abdominal aortic aneurysm) (HCC)   measures 2.7 cm. . Arthritis  . Back pain  . BPH (benign prostatic hypertrophy)  . Bruises easily  . CAD (coronary artery disease)  . Cervical spine fracture ([place]) feb 1990  C 6, C 7 and T 1, no surgery done wore halo brace . CLL (chronic lymphocytic leukemia) ([place])   hx of worked up 2009 by dr Benay Spice, no tx done, released by dr Benay Spice . Complication of anesthesia   cannot lie on right side gets vertigo and trouble turning neck to right . COPD (chronic obstructive pulmonary disease) ([place]) may 2005 . Erectile dysfunction  . Fatigue  . GERD (gastroesophageal reflux disease)  . Glaucoma  . Hepatitis as child  serum hepatitis . History of oxygen administration   oxygen  @2   l/m nasally at bedtime. Marland Kitchen History of radiation therapy 06/22/16-06/28/16  Left upper lobe of lung/ 54 Gy in 3 fractions . Hyperlipidemia  . Hypertension  . Impaired hearing   wears Hearing aids . Ischemic cardiomyopathy  . Leg pain  . Lumbar disc disease  . Lung cancer ([place]) dx'd 03/18/16 . Microscopic hematuria  . Myocardial infarct ([place]) 03-17-2009  Hx MI 2000 . Neuropathy   both feet and legs . Osteopenia  . Pneumonia   hx x2 . Thyroid nodule  Past Surgical History: Past Surgical History: Procedure Laterality Date . APPENDECTOMY  sept, 10, 2005 . BACK SURGERY  2009  L 2 and L 3  . benign cyst removed from left neck  02-2010 . CERVICAL DISC SURGERY  2005-2008  C 2, C 3 and C 4 2005, rods placed C 6, C 7 and T 1 . COLONOSCOPY WITH PROPOFOL N/A 11/26/2014  Procedure: COLONOSCOPY WITH PROPOFOL;  Surgeon: Garlan Fair, MD;  Location: [facility];  Service: Endoscopy;  Laterality: N/A; . CORONARY ANGIOPLASTY WITH STENT PLACEMENT  2013  1 stent repair, one stent replaced . ESOPHAGOGASTRODUODENOSCOPY (EGD) WITH PROPOFOL N/A  11/27/2013  Procedure: ESOPHAGOGASTRODUODENOSCOPY (EGD) WITH PROPOFOL;   Surgeon: Garlan Fair, MD;  Location: [facility];  Service: Endoscopy;  Laterality: N/A; . EYE SURGERY Bilateral july 2011  eye surgry for glaucoma . FUDUCIAL PLACEMENT N/A 05/12/2016  Procedure: PLACEMENT OF FUDUCIAL;  Surgeon: Melrose Nakayama, MD;  Location: [facility];  Service: Thoracic;  Laterality: N/A; . fusion L 3 and L 4  August 15, 2008 . LEFT HEART CATHETERIZATION WITH CORONARY ANGIOGRAM N/A 08/12/2011  Procedure: LEFT HEART CATHETERIZATION WITH CORONARY ANGIOGRAM;  Surgeon: Larey Dresser, MD;  Location: [facility] CATH LAB;  Service: Cardiovascular;  Laterality: N/A; . NASAL SINUS SURGERY   oct 2005 . right knee arthroscopy  1981 . stent top heart  2011  x 2 . TONSILLECTOMY  1943 . VIDEO BRONCHOSCOPY WITH ENDOBRONCHIAL NAVIGATION N/A 05/12/2016  Procedure: VIDEO BRONCHOSCOPY WITH ENDOBRONCHIAL NAVIGATION;  Surgeon: Melrose Nakayama, MD;  Location: [facility];  Service: Thoracic;  Laterality: N/A; . VIDEO BRONCHOSCOPY WITH ENDOBRONCHIAL NAVIGATION N/A 08/30/2019  Procedure: VIDEO BRONCHOSCOPY WITH ENDOBRONCHIAL NAVIGATION;  Surgeon: Melrose Nakayama, MD;  Location: [facility];  Service: Thoracic;  Laterality: N/A; . VIDEO BRONCHOSCOPY WITH ENDOBRONCHIAL ULTRASOUND N/A 08/30/2019  Procedure: VIDEO BRONCHOSCOPY WITH ENDOBRONCHIAL ULTRASOUND;  Surgeon: Melrose Nakayama, MD;  Location: [facility];  Service: Thoracic;  Laterality: N/A; HPI: 84 y.o. 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 PET scan on 5/21 shows "Hypermetabolism noted right vocal cord. There is some medial deviation of the posterior left focal cord. 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 Pt reports he often gets choked with liquids, particulalry if he drinks quickly. He uses a positional strategy of deep bend at his torso with a head turn left to divert the bolus down the right side of his pharynx. Under fluoroscopy with head in neutral with initial sips, pt took careful single sips of thin with a slight oral hold with no aspiration. When instructed to drink consecutively he had severe aspiraiton during the swallow with insufficient dysphonic cough to clear. Cough seemed more sensitive in the trachea than the vestibule or glottis as pt did not always have a reflexive cough with frank penetration or trace aspiration just below the VF. When small single sips of thin were repeated in neutral pt had ongoing aspiration and prior success could not be repeated. Pt reduced quantity of aspiration to trace high aspiration with a head turn left and tilt right (deep bend not needed) and with a cUED throat clear, ejected aspiration. Nectar thick liquids could be consumed without positional strategies resulting in a coating on the cords. Pt has mild residue with all textures that he clears with second swallow. Recommend pt primarily consume nectar thick liquids, but can have sips of thin with positional strategies. Needs education on thickening, pulmonary hygiene, oral care and f/u with ENT and OP SLP to address voice and swallowing needs.  SLP Visit Diagnosis Dysphagia, oropharyngeal phase (R13.12) Attention and concentration deficit  following -- Frontal lobe and executive function deficit following -- Impact on safety and function Severe aspiration risk;Risk for inadequate nutrition/hydration   CHL IP TREATMENT RECOMMENDATION 09/16/2019  Treatment Recommendations Therapy as outlined in treatment plan below   Prognosis 09/16/2019 Prognosis for Safe Diet Advancement Good Barriers to Reach Goals -- Barriers/Prognosis Comment -- CHL IP DIET RECOMMENDATION 09/16/2019 SLP Diet Recommendations Regular solids;Nectar thick liquid;Thin liquid Liquid Administration via Cup Medication Administration Whole meds with puree Compensations Slow rate;Small sips/bites;Clear throat intermittently;Multiple dry swallows after each bite/sip Postural Changes Seated upright at 90 degrees   CHL IP OTHER RECOMMENDATIONS 09/16/2019 Recommended Consults -- Oral Care Recommendations Oral care QID Other Recommendations Have oral suction available   CHL IP FOLLOW UP RECOMMENDATIONS 09/16/2019 Follow up Recommendations Outpatient SLP   CHL IP FREQUENCY AND DURATION 09/16/2019 Speech Therapy Frequency (ACUTE ONLY) min 2x/week Treatment Duration 2 weeks      CHL IP ORAL PHASE 09/16/2019 Oral Phase WFL Oral - Pudding Teaspoon -- Oral - Pudding Cup -- Oral - Honey Teaspoon -- Oral - Honey Cup -- Oral - Nectar Teaspoon -- Oral - Nectar Cup -- Oral - Nectar Straw -- Oral - Thin Teaspoon -- Oral - Thin Cup -- Oral - Thin Straw -- Oral - Puree -- Oral - Mech Soft -- Oral - Regular -- Oral - Multi-Consistency -- Oral - Pill -- Oral Phase - Comment --  CHL IP PHARYNGEAL PHASE 09/16/2019 Pharyngeal Phase Impaired Pharyngeal- Pudding Teaspoon -- Pharyngeal -- Pharyngeal- Pudding Cup -- Pharyngeal -- Pharyngeal- Honey Teaspoon -- Pharyngeal -- Pharyngeal- Honey Cup -- Pharyngeal -- Pharyngeal- Nectar Teaspoon -- Pharyngeal -- Pharyngeal- Nectar Cup Reduced airway/laryngeal closure;Penetration/Aspiration during swallow;Trace aspiration Pharyngeal Material enters airway, passes BELOW cords then ejected out;Material does not enter airway Pharyngeal- Nectar Straw -- Pharyngeal -- Pharyngeal- Thin Teaspoon -- Pharyngeal -- Pharyngeal- Thin Cup Penetration/Aspiration during swallow;Significant aspiration  (Amount);Reduced airway/laryngeal closure Pharyngeal Material enters airway, passes BELOW cords without attempt by patient to eject out (silent aspiration);Material enters airway, passes BELOW cords and not ejected out despite cough attempt by patient;Material does not enter airway Pharyngeal- Thin Straw -- Pharyngeal -- Pharyngeal- Puree WFL Pharyngeal -- Pharyngeal- Mechanical Soft -- Pharyngeal -- Pharyngeal- Regular WFL Pharyngeal -- Pharyngeal- Multi-consistency -- Pharyngeal -- Pharyngeal- Pill -- Pharyngeal -- Pharyngeal Comment --  No flowsheet data found. DeBlois, Katherene Ponto 09/16/2019, 9:02 AM              ECHOCARDIOGRAM COMPLETE  Result Date: 09/15/2019    ECHOCARDIOGRAM REPORT   Patient Name:   NORRIS BRUMBACH Date of Exam: 09/15/2019 Medical Rec #:  [id number]     Height:       69.0 in Accession #:    [id number]    Weight:       190.0 lb Date of Birth:  1935/05/07    BSA:          2.021 m Patient Age:    64 years      BP:           105/81 mmHg Patient Gender: M             HR:           81 bpm. Exam Location:  Inpatient Procedure: 2D Echo Indications:     chest pain 786.50  History:         Patient has prior history of Echocardiogram examinations, most  recent 03/23/2016. Cardiomyopathy, CAD, COPD and abdominal aortic                  aneurysm. Pneumonia; Risk Factors:Sleep Apnea and Dyslipidemia.  Sonographer:     [place] Referring Phys:  [id number] Lequita Halt Diagnosing Phys: Adrian Prows MD IMPRESSIONS  1. Left ventricular ejection fraction, by estimation, is 40 to 45%. Left ventricular ejection fraction by 2D MOD biplane is 35.4 %. The left ventricle has mildly decreased function. The left ventricle demonstrates regional wall motion abnormalities (see  scoring diagram/findings for description). Left ventricular diastolic parameters are consistent with Grade I diastolic dysfunction (impaired relaxation). There is mild hypokinesis of the left ventricular, entire  inferolateral wall.  2. Right ventricular systolic function is normal. The right ventricular size is normal.  3. The mitral valve is normal in structure. No evidence of mitral valve regurgitation. No evidence of mitral stenosis.  4. The aortic valve is normal in structure. Aortic valve regurgitation is not visualized. No aortic stenosis is present.  5. The inferior vena cava is dilated in size with >50% respiratory variability, suggesting right atrial pressure of 8 mmHg. FINDINGS  Left Ventricle: Left ventricular ejection fraction, by estimation, is 40 to 45%. Left ventricular ejection fraction by 2D MOD biplane is 35.4 %. The left ventricle has mildly decreased function. The left ventricle demonstrates regional wall motion abnormalities. Mild hypokinesis of the left ventricular, entire inferolateral wall. The left ventricular internal cavity size was normal in size. There is no left ventricular hypertrophy. Left ventricular diastolic parameters are consistent with Grade I diastolic dysfunction (impaired relaxation). Normal left ventricular filling pressure. Right Ventricle: The right ventricular size is normal. No increase in right ventricular wall thickness. Right ventricular systolic function is normal. Left Atrium: Left atrial size was normal in size. Right Atrium: Right atrial size was normal in size. Pericardium: There is no evidence of pericardial effusion. Mitral Valve: The mitral valve is normal in structure. Normal mobility of the mitral valve leaflets. No evidence of mitral valve regurgitation. No evidence of mitral valve stenosis. Tricuspid Valve: The tricuspid valve is normal in structure. Tricuspid valve regurgitation is not demonstrated. No evidence of tricuspid stenosis. Aortic Valve: The aortic valve is normal in structure. Aortic valve regurgitation is not visualized. No aortic stenosis is present. Pulmonic Valve: The pulmonic valve was not well visualized. Pulmonic valve regurgitation is not  visualized. Aorta: The aortic root is normal in size and structure. Venous: The inferior vena cava is dilated in size with greater than 50% respiratory variability, suggesting right atrial pressure of 8 mmHg. IAS/Shunts: No atrial level shunt detected by color flow Doppler.  LEFT VENTRICLE PLAX 2D                        Biplane EF (MOD) LVIDd:         5.00 cm         LV Biplane EF:   Left LVIDs:         4.20 cm                          ventricular LV PW:         1.00 cm                          ejection LV IVS:        1.00 cm  fraction by LVOT diam:     2.30 cm                          2D MOD LV SV:         68                               biplane is LV SV Index:   34                               35.4 %. LVOT Area:     4.15 cm                                Diastology                                LV e' lateral:   7.40 cm/s LV Volumes (MOD)               LV E/e' lateral: 8.6 LV vol d, MOD    64.3 ml       LV e' medial:    6.53 cm/s A2C:                           LV E/e' medial:  9.7 LV vol d, MOD    67.6 ml A4C: LV vol s, MOD    45.0 ml A2C: LV vol s, MOD    38.8 ml A4C: LV SV MOD A2C:   19.3 ml LV SV MOD A4C:   67.6 ml LV SV MOD BP:    23.2 ml RIGHT VENTRICLE         IVC TAPSE (M-mode): 1.4 cm  IVC diam: 2.30 cm LEFT ATRIUM             Index       RIGHT ATRIUM           Index LA diam:        2.90 cm 1.43 cm/m  RA Area:     13.50 cm LA Vol (A2C):   52.0 ml 25.72 ml/m RA Volume:   28.30 ml  14.00 ml/m LA Vol (A4C):   38.0 ml 18.80 ml/m LA Biplane Vol: 48.4 ml 23.94 ml/m  AORTIC VALVE LVOT Vmax:   90.00 cm/s LVOT Vmean:  60.400 cm/s LVOT VTI:    0.163 m  AORTA Ao Root diam: 3.70 cm Ao Asc diam:  3.20 cm MITRAL VALVE MV Area (PHT): 3.77 cm     SHUNTS MV Decel Time: 201 msec     Systemic VTI:  0.16 m MV E velocity: 63.40 cm/s   Systemic Diam: 2.30 cm MV A velocity: 109.00 cm/s MV E/A ratio:  0.58 Adrian Prows MD Electronically signed by Adrian Prows MD Signature Date/Time: 09/15/2019/5:20:15 PM     Final      LOS: 2 days   Signature  Lala Lund M.D on 09/17/2019 at 10:41 AM   -  To page go to [email or web address]

## 2019-09-17 NOTE — Progress Notes (Signed)
Pt refusing to wear cpap for the night.

## 2019-09-18 MED ORDER — METRONIDAZOLE 500 MG PO TABS
500.0000 mg | ORAL_TABLET | Freq: Three times a day (TID) | ORAL | 0 refills | Status: AC
Start: 2019-09-18 — End: 2019-09-23

## 2019-09-18 MED ORDER — METOPROLOL SUCCINATE ER 25 MG PO TB24: 12.5000 mg | ORAL_TABLET | Freq: Every morning | ORAL | 0 refills | Status: AC

## 2019-09-18 MED ORDER — CEPHALEXIN 500 MG PO CAPS: 500.0000 mg | ORAL_CAPSULE | Freq: Three times a day (TID) | ORAL | 0 refills | Status: AC

## 2019-09-18 MED ORDER — ISOSORBIDE MONONITRATE ER 30 MG PO TB24
30.0000 mg | ORAL_TABLET | Freq: Every day | ORAL | Status: DC
  Administered 2019-09-18: 30 mg via ORAL
  Filled 2019-09-18: qty 1

## 2019-09-18 MED ORDER — METOPROLOL SUCCINATE ER 25 MG PO TB24
12.5000 mg | ORAL_TABLET | Freq: Every morning | ORAL | Status: DC
  Filled 2019-09-18: qty 1

## 2019-09-18 MED ORDER — RANOLAZINE ER 500 MG PO TB12: 500.0000 mg | ORAL_TABLET | Freq: Two times a day (BID) | ORAL | 0 refills | Status: DC

## 2019-09-18 MED ORDER — CLOPIDOGREL BISULFATE 75 MG PO TABS: 75.0000 mg | ORAL_TABLET | Freq: Every day | ORAL | 0 refills | Status: DC

## 2019-09-18 MED ORDER — ISOSORBIDE MONONITRATE ER 30 MG PO TB24: 30.0000 mg | ORAL_TABLET | Freq: Every day | ORAL | 0 refills | Status: DC

## 2019-09-18 NOTE — Discharge Summary (Signed)
[name] SKA:[id number] DOB: 08/08/1935 DOA: 09/15/2019  PCP: Wenda Low, MD  Admit date: 09/15/2019  Discharge date: 09/18/2019  Admitted From: Home  Disposition:  Home   Recommendations for Outpatient Follow-up:   Follow up with PCP in 1-2 weeks  PCP Please obtain BMP/CBC, 2 view CXR in 1week,  (see Discharge instructions)   PCP Please follow up on the following pending results: Review imaging results with the patient, monitor blood pressure.  Needs close outpatient follow-up by his cardiologist Dr. Einar Gip.   Home Health: PT,RN   Equipment/Devices: None  Consultations: Dr. Einar Gip over the phone Discharge Condition: Stable    CODE STATUS: Full    Diet Recommendation: Heart Healthy   Diet Order            Diet regular Room service appropriate? Yes; Fluid consistency: Thin  Diet effective now           Diet - low sodium heart healthy                  Chief Complaint  Patient presents with  . Chest Pain  . Shortness of Breath     Brief history of present illness from the day of admission and additional interim summary    Patient is a 84 y.o. 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 He was seen by his primary oncologist on 6/24-started on Z-Pak-which he completed but developed severe pruritus.  He presented to the ED on 7/3 with chest pain-found to have bilateral infiltrates-and subsequently admitted to the hospitalist  service.  Significant events: 7/3>> presented to the ED with chest pain radiating to the neck (similar to his prior MI)-found to have bilateral infiltrates on chest x-ray and admitted to the hospitalist service.  Significant studies: 7/4>> chest x-ray: Persistent bilateral patchy airspace disease 7/3>> chest x-ray: Interval worsening bilateral patchy airspace disease -likely multifocal PNA 6/17>> chest x-ray: Right upper lobe airspace opacity noted concerning for pneumonia 6/14>> chest x-ray: No active cardio pulmonary disease.  Antimicrobial therapy: Rocephin: 7/3>> Flagyl: 7/3>>                                                                  Hospital Course     Chest pain-likely stable angina pectoris-history of CAD s/p PCI in 2011 and 2013: Evaluated by Dr. Einar Gip patient's primary cardiologist, he had another episode of chest pain at rest last night on 09/16/2018, already on maximum treatment with aspirin,  Plavix, statin, Imdur and beta-blocker.  Discussed the case and echo findings with Dr. Einar Gip on 09/17/2019, we added Ranexa with good improvement, blood pressure is slightly low so other medications were adjusted and we had to drop Imdur and beta-blocker dose.  Currently chest pain-free even with exertion, will follow with PCP and Dr. Einar Gip post discharge.  Bilateral lung infiltrates: He really has no symptoms of pneumonia-he presented to the ED with chest pain.  Does have some mild leukocytosis but otherwise appears well.  Patient is s/p radiation earlier this year-just finished a course of Zithromax for RUL PNA-subsequently developed severe pruritus.  Procalcitonin is negative.  I wonder if this represents a noninfectious pneumonitis-reviewed imaging with PCCM- Dr Aura Dials will go ahead and get a HRCT chest to further define lung parenchyma.  Could have had atypical presentation of community-acquired pneumonia, with antibiotics improved will get 5 more days of antibiotics with  outpatient follow-up with PCP and pulmonary.  Chronic hypoxic respiratory failure: Per patient-since mid April-he has been using home O2 24/7-prior to that-he has been on O2 only nocturnally.  COPD: No wheezing-not in exacerbation-continue bronchodilator regimen  Stage Ia-nonsmall cell CA of the left upper lobe: S/p radiation in 2018-then completed another round of radiation in January 2021 for enlarging left upper lobe lung nodule.  Recent bronchoscopy/biopsy was negative for malignancy (see above summary)  Hoarseness of voice secondary to partial vocal cord paralysis: Evaluated by Dr. Wilburn Cornelia in April 2021-likely secondary to radiation-recommendations were observation/follow-up.  SLP evaluation completed 7/4-continue regular diet.  Much improved.  Chronic systolic heart failure: Euvolemic on exam.  EF around 40%.  No ACE/ARB or Entresto due to allergy.    BPH: Continue Flomax   Discharge diagnosis     Active Problems:   Aspiration pneumonia ([place])   Pneumonia    Discharge instructions    Discharge Instructions    Diet - low sodium heart healthy   Complete by: As directed    Discharge instructions   Complete by: As directed    Follow with Primary MD Wenda Low, MD in 7 days also follow-up with your cardiologist and pulmonary physician within a week.  Review your CT scan findings with your pulmonary physician as well.  Get CBC, CMP, 2 view Chest X ray -  checked next visit within 1 week by Primary MD, follow the reports of your CT scan, chest x-ray and echocardiogram with your primary physician, cardiologist and pulmonary physician.   Activity: As tolerated with Full fall precautions use walker/cane & assistance as needed  Disposition Home    Diet: Heart Healthy    Special Instructions: If you have smoked or chewed Tobacco  in the last 2 yrs please stop smoking, stop any regular Alcohol  and or any Recreational drug use.  On your next visit with your primary  care physician please Get Medicines reviewed and adjusted.  Please request your Prim.MD to go over all Hospital Tests and Procedure/Radiological results at the follow up, please get all Hospital records sent to your Prim MD by signing hospital release before you go home.  If you experience worsening of your admission symptoms, develop shortness of breath, life threatening emergency, suicidal or homicidal thoughts you must seek medical attention immediately by calling 911 or calling your MD immediately  if symptoms less severe.  You Must read complete instructions/literature along with all the possible adverse reactions/side effects for all the Medicines you take and that have been prescribed to you. Take any new Medicines after  you have completely understood and accpet all the possible adverse reactions/side effects.   Increase activity slowly   Complete by: As directed       Discharge Medications   Allergies as of 09/18/2019      Reactions   Entresto [sacubitril-valsartan] Diarrhea   Patient had diarrhea, hyptotensive,dizziness,headache and lethargic   Iodinated Diagnostic Agents Swelling, Other (See Comments)   Patient experienced tongue tingling, followed by swelling under tongue and in throat area, also patient had eye redness/irriation - Isovve Nonionic Contrast 75U   Penicillins Swelling   SWELLING OF TONGUE   Sulfonamide Derivatives Swelling   SWELLING HANDS   Lumigan [bimatoprost] Swelling   EYES SWELL   Lyrica [pregabalin] Other (See Comments)   TREMORS CONFUSION   Symbicort [budesonide-formoterol Fumarate] Itching, Swelling   Valsartan Other (See Comments)   BP drop   Advair Diskus [fluticasone-salmeterol] Itching   Azithromycin Itching   Itching from head to toe   Brimonidine Tartrate    unknown   Ciprofloxacin    Patient does not want to have this anymore(does not want any forms of this) patient reports having C Diff after taking this   Flovent Diskus [fluticasone  Propionate (inhal)] Itching   Pulmicort [budesonide] Itching, Swelling   Qvar [beclomethasone] Itching, Swelling   Spiriva [tiotropium Bromide Monohydrate] Itching, Swelling   Tessalon [benzonatate] Other (See Comments)   Dizzy/skin reaction   Bacitracin Itching, Swelling, Anxiety   Flonase [fluticasone Propionate] Other (See Comments)   NOSE BLEED   Relafen [nabumetone] Rash   REACTION: Reaction not known to relafen   Tape Itching, Rash, Other (See Comments)   Skin tear, please use paper tape      Medication List    STOP taking these medications   azithromycin 250 MG tablet Commonly known as: Zithromax     TAKE these medications   aspirin 81 MG tablet Take 81 mg by mouth daily.   atorvastatin 20 MG tablet Commonly known as: LIPITOR Take 20 mg by mouth daily.   CALCIUM-MAGNESIUM-VITAMIN D PO Take 1 tablet by mouth daily.   cephALEXin 500 MG capsule Commonly known as: KEFLEX Take 1 capsule (500 mg total) by mouth 3 (three) times daily for 5 days.   clopidogrel 75 MG tablet Commonly known as: PLAVIX Take 1 tablet (75 mg total) by mouth daily. Start taking on: September 19, 2019   Flomax 0.4 MG Caps capsule Generic drug: tamsulosin Take 0.4 mg by mouth every evening.   gabapentin 300 MG capsule Commonly known as: NEURONTIN Take 600 mg by mouth 3 (three) times daily.   hydrOXYzine 10 MG tablet Commonly known as: ATARAX/VISTARIL Take 1 tablet (10 mg total) by mouth every 6 (six) hours as needed for itching.   isosorbide mononitrate 30 MG 24 hr tablet Commonly known as: IMDUR Take 1 tablet (30 mg total) by mouth daily. Start taking on: September 19, 2019   latanoprost 0.005 % ophthalmic solution Commonly known as: XALATAN Place 1 drop into both eyes at bedtime.   metoprolol succinate 25 MG 24 hr tablet Commonly known as: TOPROL-XL Take 0.5 tablets (12.5 mg total) by mouth every morning. Start taking on: September 19, 2019 What changed: how much to take   metroNIDAZOLE 500  MG tablet Commonly known as: Flagyl Take 1 tablet (500 mg total) by mouth 3 (three) times daily for 5 days.   nitroGLYCERIN 0.4 MG SL tablet Commonly known as: NITROSTAT Place 0.4 mg under the tongue every 5 (five) minutes as needed for chest  pain. For chest pain   OXYGEN Inhale 2 L into the lungs at bedtime.   ProAir HFA 108 (90 Base) MCG/ACT inhaler Generic drug: albuterol Inhale 2 puffs into the lungs every 6 (six) hours as needed for wheezing or shortness of breath.   ranolazine 500 MG 12 hr tablet Commonly known as: RANEXA Take 1 tablet (500 mg total) by mouth 2 (two) times daily. Call your cardiology office if you have any problems with procuring this medication.   sucralfate 1 g tablet Commonly known as: Carafate Take 1 tablet (1 g total) by mouth 4 (four) times daily -  with meals and at bedtime. 30 mins prior to meals, crush and dissolve in 10 mL of warm water prior to swallowing What changed: when to take this   timolol 0.5 % ophthalmic solution Commonly known as: TIMOPTIC Place 1 drop into both eyes 2 (two) times daily.        Follow-up Information    Wenda Low, MD. Schedule an appointment as soon as possible for a visit in 1 week(s).   Specialty: Internal Medicine Contact information: [address] [place] [address] [phone]        Adrian Prows, MD. Schedule an appointment as soon as possible for a visit in 1 week(s).   Specialty: Cardiology Contact information: [place] [address] [phone]               Major procedures and Radiology Reports - PLEASE review detailed and final reports thoroughly  -       DG Chest 2 View  Result Date: 09/15/2019 CLINICAL DATA:  Chest pain and shortness of breath 4 days. Recent pneumonia. EXAM: CHEST - 2 VIEW COMPARISON:  08/30/2019 FINDINGS: Lungs are somewhat hypoinflated demonstrate interval worsening bilateral patchy hazy airspace process over the mid to  lower lungs likely multifocal pneumonia. No evidence of effusion. Cardiomediastinal silhouette and remainder of the exam is unchanged. IMPRESSION: Hypoinflation with interval worsening bilateral patchy airspace process over the mid to lower lungs likely multifocal pneumonia. Electronically Signed   By: Marin Olp M.D.   On: 09/15/2019 08:19   DG Chest 2 View  Result Date: 08/27/2019 CLINICAL DATA:  Preop bronchoscopy EXAM: CHEST - 2 VIEW COMPARISON:  05/12/2016 FINDINGS: There is scarring at the right lung base. Metallic markers are present at the left lung apex. No focal airspace consolidation. IMPRESSION: No active cardiopulmonary disease. Electronically Signed   By: Ulyses Jarred M.D.   On: 08/27/2019 23:31   MR BRAIN W WO CONTRAST  Result Date: 08/28/2019 CLINICAL DATA:  84 year old male with non-small cell lung cancer. Evidence of chest progression on PET-CT last month. Restaging. EXAM: MRI HEAD WITHOUT AND WITH CONTRAST TECHNIQUE: Multiplanar, multiecho pulse sequences of the brain and surrounding structures were obtained without and with intravenous contrast. CONTRAST:  66mL GADAVIST GADOBUTROL 1 MMOL/ML IV SOLN COMPARISON:  Brain MRI 07/07/2016 and earlier. FINDINGS: Brain: No abnormal enhancement identified. No dural thickening. No midline shift, mass effect, or evidence of intracranial mass lesion. No restricted diffusion to suggest acute infarction. No ventriculomegaly, extra-axial collection or acute intracranial hemorrhage. Cervicomedullary junction and pituitary are within normal limits. Mild generalized cerebral volume loss since 2018. Widely scattered cerebral white matter T2 and FLAIR hyperintensity has not significantly changed. Chronic T2 heterogeneity in the deep gray nuclei is stable and at least part due to perivascular spaces. Patchy T2 hyperintensity in the pons has increased since 2018. No cortical encephalomalacia  or chronic cerebral blood products identified. Vascular: Major  intracranial vascular flow voids are stable. Pneumatized right anterior clinoid process (normal variant). The major dural venous sinuses are enhancing and appear to be patent. Skull and upper cervical spine: Partially visible cervical spine fusion with mild susceptibility artifact. Negative visible spinal cord. Skull bone marrow signal remains normal. Sinuses/Orbits: Postoperative changes to both globes since 2018. Stable chronic paranasal sinus surgery. Other: Mastoids remain clear. Visible internal auditory structures appear normal. Scalp and face soft tissues appear negative. IMPRESSION: 1. No metastatic disease or acute intracranial abnormality. 2. Chronic signal changes compatible with small vessel disease, mildly progressed since 2018. Electronically Signed   By: Genevie Ann M.D.   On: 08/28/2019 09:23   CT Chest High Resolution  Result Date: 09/16/2019 CLINICAL DATA:  Shortness of breath, history of lung cancer EXAM: CT CHEST WITHOUT CONTRAST TECHNIQUE: Multidetector CT imaging of the chest was performed following the standard protocol without intravenous contrast. High resolution imaging of the lungs, as well as inspiratory and expiratory imaging, was performed. COMPARISON:  08/29/2019 FINDINGS: Cardiovascular: Aortic atherosclerosis. Enlargement of the descending thoracic aorta measuring 3.4 x 3.4 cm. Normal heart size. Extensive 3 vessel coronary artery calcifications and/or stents. No pericardial effusion. Enlargement of the main pulmonary artery up to 3.6 cm. Mediastinum/Nodes: Prominent mediastinal lymph nodes, unchanged. Right lobe thyroid nodule described on prior examination is not well appreciated on current examination. Trachea, and esophagus demonstrate no significant findings. Lungs/Pleura: Unchanged post treatment consolidation of the medial apical left upper lobe containing fiducial markers (series 6, image 38). Moderate centrilobular emphysema. There is bilateral, somewhat bandlike  heterogeneous airspace opacity and consolidation most conspicuously involving the right middle lobe and peripheral upper lobes (series 6, image 78, 98). Unchanged subpleural nodule of the right lung base measuring 1.6 x 0.8 cm. No pleural effusion or pneumothorax. Upper Abdomen: No acute abnormality. Musculoskeletal: No chest wall mass or suspicious bone lesions identified. IMPRESSION: 1. There is bilateral, somewhat bandlike heterogeneous airspace opacity and consolidation most conspicuously involving the right middle lobe and peripheral upper lobes. Findings are consistent with multifocal infection and/or aspiration. 2. Unchanged post treatment consolidation of the medial apical left upper lobe containing fiducial markers in keeping with known lung malignancy. 3. Unchanged subpleural nodule of the right lung base measuring 1.6 x 0.8 cm. 4. Unchanged prominent mediastinal lymph nodes. 5. Emphysema (ICD10-J43.9). 6. Coronary artery disease. 7. Enlargement of the main pulmonary artery, as can be seen in pulmonary hypertension. 8. Enlargement of the descending thoracic aorta measuring 3.4 x 3.4 cm. Aortic Atherosclerosis (ICD10-I70.0). Aortic aneurysm NOS (ICD10-I71.9). Electronically Signed   By: Eddie Candle M.D.   On: 09/16/2019 15:27   DG Chest Port 1 View  Result Date: 09/16/2019 CLINICAL DATA:  Pneumonia.  History of lung cancer. EXAM: PORTABLE CHEST 1 VIEW COMPARISON:  09/15/2019 and 08/27/2019 FINDINGS: Lungs are hypoinflated demonstrate patchy bilateral airspace opacification with slight interval improved aeration over the left midlung. Findings likely due to multifocal infection. No effusion or pneumothorax. Stable known left apical lung mass likely patient's known lung cancer containing two metallic densities. Cardiomediastinal silhouette and remainder of the exam is unchanged. IMPRESSION: 1. Persistent bilateral patchy airspace process with slight interval improved aeration over the left midlung likely  multifocal infection. 2. Stable known left apical lung mass likely patient's known lung cancer. Electronically Signed   By: Marin Olp M.D.   On: 09/16/2019 09:03   DG Chest Port 1 View  Result Date: 08/30/2019 CLINICAL DATA:  Postoperative status. EXAM: PORTABLE CHEST 1 VIEW COMPARISON:  August 27, 2019. FINDINGS: Stable cardiomediastinal silhouette. No pneumothorax or pleural effusion is noted. Airspace opacity is noted laterally in the right upper lobe concerning for pneumonia. Bony thorax is unremarkable. IMPRESSION: Right upper lobe airspace opacity is noted concerning for pneumonia. Follow-up radiographs are recommended to ensure resolution. Electronically Signed   By: Marijo Conception M.D.   On: 08/30/2019 10:51   DG Swallowing Func-Speech Pathology  Result Date: 09/16/2019 Objective Swallowing Evaluation: Type of Study: MBS-Modified Barium Swallow Study  Patient Details Name: NOEMI ISHMAEL MRN: [id number] Date of Birth: [birth year] Today's Date: 09/16/2019 Time: SLP Start Time (ACUTE ONLY): 0755 -SLP Stop Time (ACUTE ONLY): 0820 SLP Time Calculation (min) (ACUTE ONLY): 25 min Past Medical History: Past Medical History: Diagnosis Date . AAA (abdominal aortic aneurysm) ([facility])   measures 2.7 cm. . Arthritis  . Back pain  . BPH (benign prostatic hypertrophy)  . Bruises easily  . CAD (coronary artery disease)  . Cervical spine fracture ([facility]) feb 1990  C 6, C 7 and T 1, no surgery done wore halo brace . CLL (chronic lymphocytic leukemia) ([facility])   hx of worked up 2009 by dr Benay Spice, no tx done, released by dr Benay Spice . Complication of anesthesia   cannot lie on right side gets vertigo and trouble turning neck to right . COPD (chronic obstructive pulmonary disease) ([place]) may 2005 . Erectile dysfunction  . Fatigue  . GERD (gastroesophageal reflux disease)  . Glaucoma  . Hepatitis as child  serum hepatitis . History of oxygen administration   oxygen  @2   l/m nasally at bedtime. Marland Kitchen History of radiation therapy  06/22/16-06/28/16  Left upper lobe of lung/ 54 Gy in 3 fractions . Hyperlipidemia  . Hypertension  . Impaired hearing   wears Hearing aids . Ischemic cardiomyopathy  . Leg pain  . Lumbar disc disease  . Lung cancer ([place]) dx'd 03/18/16 . Microscopic hematuria  . Myocardial infarct ([place]) 03-17-2009  Hx MI 2000 . Neuropathy   both feet and legs . Osteopenia  . Pneumonia   hx x2 . Thyroid nodule  Past Surgical History: Past Surgical History: Procedure Laterality Date . APPENDECTOMY  sept, 10, 2005 . BACK SURGERY  2009  L 2 and L 3  . benign cyst removed from left neck  02-2010 . CERVICAL DISC SURGERY  2005-2008  C 2, C 3 and C 4 2005, rods placed C 6, C 7 and T 1 . COLONOSCOPY WITH PROPOFOL N/A 11/26/2014  Procedure: COLONOSCOPY WITH PROPOFOL;  Surgeon: Garlan Fair, MD;  Location: [facility];  Service: Endoscopy;  Laterality: N/A; . CORONARY ANGIOPLASTY WITH STENT PLACEMENT  2013  1 stent repair, one stent replaced . ESOPHAGOGASTRODUODENOSCOPY (EGD) WITH PROPOFOL N/A 11/27/2013  Procedure: ESOPHAGOGASTRODUODENOSCOPY (EGD) WITH PROPOFOL;  Surgeon: Garlan Fair, MD;  Location: [facility];  Service: Endoscopy;  Laterality: N/A; . EYE SURGERY Bilateral july 2011  eye surgry for glaucoma . FUDUCIAL PLACEMENT N/A 05/12/2016  Procedure: PLACEMENT OF FUDUCIAL;  Surgeon: Melrose Nakayama, MD;  Location: [facility];  Service: Thoracic;  Laterality: N/A; . fusion L 3 and L 4  August 15, 2008 . LEFT HEART CATHETERIZATION WITH CORONARY ANGIOGRAM N/A 08/12/2011  Procedure: LEFT HEART CATHETERIZATION WITH CORONARY ANGIOGRAM;  Surgeon: Larey Dresser, MD;  Location: [facility] CATH LAB;  Service: Cardiovascular;  Laterality: N/A; . NASAL SINUS SURGERY   oct 2005 . right knee arthroscopy  1981 . stent top heart  2011  x 2 . TONSILLECTOMY  1943 . VIDEO BRONCHOSCOPY WITH ENDOBRONCHIAL NAVIGATION N/A 05/12/2016  Procedure: VIDEO BRONCHOSCOPY WITH ENDOBRONCHIAL NAVIGATION;  Surgeon: Melrose Nakayama, MD;  Location: [place];  Service: Thoracic;   Laterality: N/A; . VIDEO BRONCHOSCOPY WITH ENDOBRONCHIAL NAVIGATION N/A 08/30/2019  Procedure: VIDEO BRONCHOSCOPY WITH ENDOBRONCHIAL NAVIGATION;  Surgeon: Melrose Nakayama, MD;  Location: [place];  Service: Thoracic;  Laterality: N/A; . VIDEO BRONCHOSCOPY WITH ENDOBRONCHIAL ULTRASOUND N/A 08/30/2019  Procedure: VIDEO BRONCHOSCOPY WITH ENDOBRONCHIAL ULTRASOUND;  Surgeon: Melrose Nakayama, MD;  Location: [place];  Service: Thoracic;  Laterality: N/A; HPI: 84 y.o. 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 PET scan on 5/21 shows "Hypermetabolism noted right vocal cord. There is some medial deviation of the posterior left focal cord. 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 Pt reports he often gets choked with liquids, particulalry if he drinks quickly. He uses a positional strategy of deep bend at his torso with a head turn left to divert the bolus down the right side of his pharynx. Under fluoroscopy with head in neutral with initial sips, pt took careful single sips of thin with a slight oral hold with no aspiration. When instructed to drink consecutively he had severe aspiraiton during the swallow with insufficient dysphonic cough to clear. Cough seemed more sensitive in the trachea than the vestibule or glottis as pt did not always have a reflexive cough with frank penetration or  trace aspiration just below the VF. When small single sips of thin were repeated in neutral pt had ongoing aspiration and prior success could not be repeated. Pt reduced quantity of aspiration to trace high aspiration with a head turn left and tilt right (deep bend not needed) and with a cUED throat clear, ejected aspiration. Nectar thick liquids could be consumed without positional strategies resulting in a coating on the cords. Pt has mild residue with all textures that he clears with second swallow. Recommend pt primarily consume nectar thick liquids, but can have sips of thin with positional strategies. Needs education on thickening, pulmonary hygiene, oral care and f/u with ENT and OP SLP to address voice and swallowing needs.  SLP Visit Diagnosis Dysphagia, oropharyngeal phase (R13.12) Attention and concentration deficit following -- Frontal lobe and executive function deficit following -- Impact on safety and function Severe aspiration risk;Risk for inadequate nutrition/hydration   [facility]L IP TREATMENT RECOMMENDATION 09/16/2019 Treatment Recommendations Therapy as outlined in treatment plan below   Prognosis 09/16/2019 Prognosis for Safe Diet Advancement Good Barriers to Reach Goals -- Barriers/Prognosis Comment -- [facility] IP DIET RECOMMENDATION 09/16/2019 SLP Diet Recommendations Regular solids;Nectar thick liquid;Thin liquid Liquid Administration via Cup Medication Administration Whole meds with puree Compensations Slow rate;Small sips/bites;Clear throat intermittently;Multiple dry swallows after each bite/sip Postural Changes Seated upright at 90 degrees   CH[facility] IP OTHER RECOMMENDATIONS 09/16/2019 Recommended Consults -- Oral Care Recommendations Oral care QID Other Recommendations Have oral suction available   [facility] IP FOLLOW UP RECOMMENDATIONS 09/16/2019  Follow up Recommendations Outpatient SLP   [facility]L IP FREQUENCY AND DURATION 09/16/2019 Speech Therapy Frequency (ACUTE ONLY) min 2x/week Treatment Duration 2 weeks      CHL IP  ORAL PHASE 09/16/2019 Oral Phase WFL Oral - Pudding Teaspoon -- Oral - Pudding Cup -- Oral - Honey Teaspoon -- Oral - Honey Cup -- Oral - Nectar Teaspoon -- Oral - Nectar Cup -- Oral - Nectar Straw -- Oral - Thin Teaspoon -- Oral - Thin Cup -- Oral - Thin Straw -- Oral - Puree -- Oral - Mech Soft -- Oral - Regular -- Oral - Multi-Consistency -- Oral - Pill -- Oral Phase - Comment --  [facility]L IP PHARYNGEAL PHASE 09/16/2019 Pharyngeal Phase Impaired Pharyngeal- Pudding Teaspoon -- Pharyngeal -- Pharyngeal- Pudding Cup -- Pharyngeal -- Pharyngeal- Honey Teaspoon -- Pharyngeal -- Pharyngeal- Honey Cup -- Pharyngeal -- Pharyngeal- Nectar Teaspoon -- Pharyngeal -- Pharyngeal- Nectar Cup Reduced airway/laryngeal closure;Penetration/Aspiration during swallow;Trace aspiration Pharyngeal Material enters airway, passes BELOW cords then ejected out;Material does not enter airway Pharyngeal- Nectar Straw -- Pharyngeal -- Pharyngeal- Thin Teaspoon -- Pharyngeal -- Pharyngeal- Thin Cup Penetration/Aspiration during swallow;Significant aspiration (Amount);Reduced airway/laryngeal closure Pharyngeal Material enters airway, passes BELOW cords without attempt by patient to eject out (silent aspiration);Material enters airway, passes BELOW cords and not ejected out despite cough attempt by patient;Material does not enter airway Pharyngeal- Thin Straw -- Pharyngeal -- Pharyngeal- Puree WFL Pharyngeal -- Pharyngeal- Mechanical Soft -- Pharyngeal -- Pharyngeal- Regular WFL Pharyngeal -- Pharyngeal- Multi-consistency -- Pharyngeal -- Pharyngeal- Pill -- Pharyngeal -- Pharyngeal Comment --  No flowsheet data found. DeBlois, Katherene Ponto 09/16/2019, 9:02 AM              ECHOCARDIOGRAM COMPLETE  Result Date: 09/15/2019    ECHOCARDIOGRAM REPORT   Patient Name:   [name] Date of Exam: 09/15/2019 Medical Rec #:  [id number]     Height:       69.0 in Accession #:    [id number]    Weight:       190.0 lb Date of Birth:  May 04, 1935    BSA:           2.021 m Patient Age:    89 years      BP:           105/81 mmHg Patient Gender: M             HR:           81 bpm. Exam Location:  Inpatient Procedure: 2D Echo Indications:     chest pain 786.50  History:         Patient has prior history of Echocardiogram examinations, most                  recent 03/23/2016. Cardiomyopathy, CAD, COPD and abdominal aortic                  aneurysm. Pneumonia; Risk Factors:Sleep Apnea and Dyslipidemia.  Sonographer:     [place] Referring Phys:  [id number] Lequita Halt Diagnosing Phys: Adrian Prows MD IMPRESSIONS  1. Left ventricular ejection fraction, by estimation, is 40 to 45%. Left ventricular ejection fraction by 2D MOD biplane is 35.4 %. The left ventricle has mildly decreased function. The left ventricle demonstrates regional wall motion abnormalities (see  scoring diagram/findings for description). Left ventricular diastolic parameters are consistent with Grade I diastolic dysfunction (impaired relaxation). There is mild hypokinesis of the left ventricular, entire inferolateral wall.  2. Right  ventricular systolic function is normal. The right ventricular size is normal.  3. The mitral valve is normal in structure. No evidence of mitral valve regurgitation. No evidence of mitral stenosis.  4. The aortic valve is normal in structure. Aortic valve regurgitation is not visualized. No aortic stenosis is present.  5. The inferior vena cava is dilated in size with >50% respiratory variability, suggesting right atrial pressure of 8 mmHg. FINDINGS  Left Ventricle: Left ventricular ejection fraction, by estimation, is 40 to 45%. Left ventricular ejection fraction by 2D MOD biplane is 35.4 %. The left ventricle has mildly decreased function. The left ventricle demonstrates regional wall motion abnormalities. Mild hypokinesis of the left ventricular, entire inferolateral wall. The left ventricular internal cavity size was normal in size. There is no left ventricular  hypertrophy. Left ventricular diastolic parameters are consistent with Grade I diastolic dysfunction (impaired relaxation). Normal left ventricular filling pressure. Right Ventricle: The right ventricular size is normal. No increase in right ventricular wall thickness. Right ventricular systolic function is normal. Left Atrium: Left atrial size was normal in size. Right Atrium: Right atrial size was normal in size. Pericardium: There is no evidence of pericardial effusion. Mitral Valve: The mitral valve is normal in structure. Normal mobility of the mitral valve leaflets. No evidence of mitral valve regurgitation. No evidence of mitral valve stenosis. Tricuspid Valve: The tricuspid valve is normal in structure. Tricuspid valve regurgitation is not demonstrated. No evidence of tricuspid stenosis. Aortic Valve: The aortic valve is normal in structure. Aortic valve regurgitation is not visualized. No aortic stenosis is present. Pulmonic Valve: The pulmonic valve was not well visualized. Pulmonic valve regurgitation is not visualized. Aorta: The aortic root is normal in size and structure. Venous: The inferior vena cava is dilated in size with greater than 50% respiratory variability, suggesting right atrial pressure of 8 mmHg. IAS/Shunts: No atrial level shunt detected by color flow Doppler.  LEFT VENTRICLE PLAX 2D                        Biplane EF (MOD) LVIDd:         5.00 cm         LV Biplane EF:   Left LVIDs:         4.20 cm                          ventricular LV PW:         1.00 cm                          ejection LV IVS:        1.00 cm                          fraction by LVOT diam:     2.30 cm                          2D MOD LV SV:         68                               biplane is LV SV Index:   34  35.4 %. LVOT Area:     4.15 cm                                Diastology                                LV e' lateral:   7.40 cm/s LV Volumes (MOD)               LV E/e' lateral: 8.6  LV vol d, MOD    64.3 ml       LV e' medial:    6.53 cm/s A2C:                           LV E/e' medial:  9.7 LV vol d, MOD    67.6 ml A4C: LV vol s, MOD    45.0 ml A2C: LV vol s, MOD    38.8 ml A4C: LV SV MOD A2C:   19.3 ml LV SV MOD A4C:   67.6 ml LV SV MOD BP:    23.2 ml RIGHT VENTRICLE         IVC TAPSE (M-mode): 1.4 cm  IVC diam: 2.30 cm LEFT ATRIUM             Index       RIGHT ATRIUM           Index LA diam:        2.90 cm 1.43 cm/m  RA Area:     13.50 cm LA Vol (A2C):   52.0 ml 25.72 ml/m RA Volume:   28.30 ml  14.00 ml/m LA Vol (A4C):   38.0 ml 18.80 ml/m LA Biplane Vol: 48.4 ml 23.94 ml/m  AORTIC VALVE LVOT Vmax:   90.00 cm/s LVOT Vmean:  60.400 cm/s LVOT VTI:    0.163 m  AORTA Ao Root diam: 3.70 cm Ao Asc diam:  3.20 cm MITRAL VALVE MV Area (PHT): 3.77 cm     SHUNTS MV Decel Time: 201 msec     Systemic VTI:  0.16 m MV E velocity: 63.40 cm/s   Systemic Diam: 2.30 cm MV A velocity: 109.00 cm/s MV E/A ratio:  0.58 Adrian Prows MD Electronically signed by Adrian Prows MD Signature Date/Time: 09/15/2019/5:20:15 PM    Final    CT Super D Chest Wo Contrast  Result Date: 08/29/2019 CLINICAL DATA:  Left apex lung mass. EXAM: CT CHEST WITHOUT CONTRAST TECHNIQUE: Multidetector CT imaging of the chest was performed using thin slice collimation for electromagnetic bronchoscopy planning purposes, without intravenous contrast. COMPARISON:  PET-CT 08/02/2019 FINDINGS: Cardiovascular: Normal heart size. No pericardial effusion. Aortic atherosclerosis. Left main and 3 vessel coronary artery calcifications. Mediastinum/Nodes: The FDG avid right lobe of thyroid gland nodule is less conspicuous on today's unenhanced study but measures approximately 1.6 cm, image 17/2. The trachea appears patent and is midline. Normal appearance of the esophagus. No enlarged supraclavicular or axillary lymph nodes. Right paratracheal lymph node measures 1.1 cm, image 57/2. Unchanged. Juxta esophageal lymph node just below the left mainstem  bronchus measures 1.6 cm and appears unchanged, image 67/2. Subcarinal node measures 1 cm, image 75/2. Unchanged. Lungs/Pleura: No pleural effusion identified. Moderate changes of emphysema. FDG avid subpleural mass within the medial left lung apex containing fiducial markers or seed implants measures 3.6 x 1.8 cm, image 27/2.  Unchanged. Right upper lobe lung nodule measures 8 mm, image 52/8. Previously this measured 0.7 cm. FDG avid right hilar perihilar nodule versus lymph node is difficult to evaluate due to lack of IV contrast material. Subpleural nodule in the paraspinal right lower lobe measures 1.3 cm, image 122/8. Previously 1.2 cm. Resolution of previous left posterior costal phrenic sulcus nodule. Upper Abdomen: No acute abnormality noted within the imaged portions of the upper abdomen. Bilateral upper pole kidney cysts are incompletely characterized without IV contrast. Musculoskeletal: Spondylosis identified within the thoracic spine. No suspicious lytic or sclerotic bone lesions. T1 and T4 superior endplate deformities are identified. When compared with the exam from 12/25/2018 the fracture involving the superior endplate of T4 is new. IMPRESSION: 1. No significant change in the appearance of subpleural mass within the medial left lung apex containing fiducial markers or seed implants. 2. Stable to minimal increased in size (by 1 mm) of right upper lobe and right lower lobe lung nodules. 3. Aortic atherosclerosis with left main and 3 vessel coronary artery calcifications noted. 4. T1 and T4 superior endplate deformities. When compared with the previous exam the fracture involving the superior endplate of T4 is new from the previous exam. 5. Again seen is FDG avid right lobe of thyroid gland nodule. In the setting of significant comorbidities or limited life expectancy, no follow-up recommended (ref: J Am Coll Radiol. 2015 Feb;12(2): 143-50). Aortic Atherosclerosis (ICD10-I70.0) and Emphysema  (ICD10-J43.9). Electronically Signed   By: Kerby Moors M.D.   On: 08/29/2019 10:37   DG C-ARM BRONCHOSCOPY  Result Date: 08/30/2019 C-ARM BRONCHOSCOPY: Fluoroscopy was utilized by the requesting physician.  No radiographic interpretation.    Micro Results     Recent Results (from the past 240 hour(s))  Culture, blood (routine x 2)     Status: None (Preliminary result)   Collection Time: 09/15/19  9:05 AM   Specimen: BLOOD RIGHT HAND  Result Value Ref Range Status   Specimen Description BLOOD RIGHT HAND  Final   Special Requests   Final    BOTTLES DRAWN AEROBIC AND ANAEROBIC Blood Culture adequate volume   Culture   Final    NO GROWTH 2 DAYS Performed at [facility], [address], [place], [place] [address]    Report Status PENDING  Incomplete  Culture, blood (routine x 2)     Status: None (Preliminary result)   Collection Time: 09/15/19  9:10 AM   Specimen: BLOOD LEFT HAND  Result Value Ref Range Status   Specimen Description BLOOD LEFT HAND  Final   Special Requests   Final    BOTTLES DRAWN AEROBIC AND ANAEROBIC Blood Culture results may not be optimal due to an inadequate volume of blood received in culture bottles   Culture   Final    NO GROWTH 2 DAYS Performed at [facility], [place] [address] [place], [place] [address]    Report Status PENDING  Incomplete  SARS Coronavirus 2 by RT PCR (hospital order, performed in [facility] hospital lab) Nasopharyngeal Nasopharyngeal Swab     Status: None   Collection Time: 09/15/19 10:32 AM   Specimen: Nasopharyngeal Swab  Result Value Ref Range Status   SARS Coronavirus 2 NEGATIVE NEGATIVE Final    Comment: (NOTE) SARS-CoV-2 target nucleic acids are NOT DETECTED.  The SARS-CoV-2 RNA is generally detectable in upper and lower respiratory specimens during the acute phase of infection. The lowest concentration of SARS-CoV-2 viral copies this assay can detect is 250  copies / mL. A negative result does not preclude  SARS-CoV-2 infection and should not be used as the sole basis for treatment or other patient management decisions.  A negative result may occur with improper specimen collection / handling, submission of specimen other than nasopharyngeal swab, presence of viral mutation(s) within the areas targeted by this assay, and inadequate number of viral copies (<250 copies / mL). A negative result must be combined with clinical observations, patient history, and epidemiological information.  Fact Sheet for Patients:   StrictlyIdeas.no  Fact Sheet for Healthcare Providers: BankingDealers.co.za  This test is not yet approved or  cleared by the [place] FDA and has been authorized for detection and/or diagnosis of SARS-CoV-2 by FDA under an Emergency Use Authorization (EUA).  This EUA will remain in effect (meaning this test can be used) for the duration of the COVID-19 declaration under Section 564(b)(1) of the Act, 21 U.S.C. section 360bbb-3(b)(1), unless the authorization is terminated or revoked sooner.  Performed at [facility], [place] [address] [place], [place] [address]     Today   Subjective    [name] today has no headache,no chest abdominal pain,no new weakness tingling or numbness, feels much better wants to go home today.    Objective   Blood pressure 99/65, pulse 98, temperature 97.6 F (36.4 C), resp. rate (!) 27, height 5\' 9"  (1.753 m), weight 86.2 kg, SpO2 94 %.   Intake/Output Summary (Last 24 hours) at 09/18/2019 1118 Last data filed at 09/18/2019 0400 Gross per 24 hour  Intake --  Output 200 ml  Net -200 ml    Exam  Awake Alert, No new F.N deficits, Normal affect Gage.AT,PERRAL Supple Neck,No JVD, No cervical lymphadenopathy appriciated.  Symmetrical Chest wall movement, Good air movement bilaterally, CTAB RRR,No Gallops,Rubs or new Murmurs, No Parasternal Heave +ve B.Sounds, Abd Soft, Non tender, No  organomegaly appriciated, No rebound -guarding or rigidity. No Cyanosis, Clubbing or edema, No new Rash or bruise   Data Review   CBC w Diff:  Lab Results  Component Value Date   WBC 11.6 (H) 09/17/2019   HGB 11.3 (L) 09/17/2019   HGB 12.9 (L) 08/16/2019   HGB 14.7 09/26/2018   HGB 14.1 09/30/2009   HCT 36.1 (L) 09/17/2019   HCT 44.0 09/26/2018   HCT 41.0 09/30/2009   PLT 181 09/17/2019   PLT 184 08/16/2019   PLT 151 09/26/2018   LYMPHOPCT 54 08/16/2019   LYMPHOPCT 33.7 09/30/2009   MONOPCT 4 08/16/2019   MONOPCT 5.8 09/30/2009   EOSPCT 2 08/16/2019   EOSPCT 1.5 09/30/2009   BASOPCT 0 08/16/2019   BASOPCT 0.4 09/30/2009    CMP:  Lab Results  Component Value Date   NA 136 09/17/2019   NA 143 09/26/2018   K 4.3 09/17/2019   CL 97 (L) 09/17/2019   CO2 31 09/17/2019   BUN 14 09/17/2019   BUN 17 09/26/2018   BUN 11.8 06/22/2016   CREATININE 0.88 09/17/2019   CREATININE 0.95 08/16/2019   CREATININE 1.0 06/22/2016   PROT 7.3 08/16/2019   PROT 7.4 09/26/2018   ALBUMIN 3.5 08/16/2019   ALBUMIN 4.6 09/26/2018   BILITOT 1.1 08/16/2019   ALKPHOS 78 08/16/2019   AST 14 (L) 08/16/2019   ALT 12 08/16/2019  .   Total Time in preparing paper work, data evaluation and todays exam - 55 minutes  Lala Lund M.D on 09/18/2019 at 11:18 AM  [facility]   Office  [phone]

## 2019-09-18 NOTE — Plan of Care (Signed)
  Problem: Education: Goal: Knowledge of General Education information will improve Description Including pain rating scale, medication(s)/side effects and non-pharmacologic comfort measures Outcome: Progressing   

## 2019-09-18 NOTE — Progress Notes (Signed)
  Speech Language Pathology Treatment: Dysphagia  Patient Details Name: [name] MRN: [id number] DOB: 02/06/1936 Today's Date: 09/18/2019 Time: 2111-5520 SLP Time Calculation (min) (ACUTE ONLY): 27 min  Assessment / Plan / Recommendation Clinical Impression  Pt immediately reports disgust of nectar thick liquids. Wife at bedside today who is able to stay on topic and take notes on recommendations. SLP reiterated severity of dysphagia and pts increasing tendency of aspirate without his awareness, though his positional strategies are very helpful, they do not fully eliminate aspiration. Wife and pt report that he plans to only drink water, tea and minimal coffee. Water being less caustic than other liquids would reduce detrimental impact of aspiration. Also discussed importance of oral hygiene to reduce oral bacteria and infection from aspiration. Pt and wife agreed to water/tea only, use of strategies and oral care before PO. Pt to f/u with ENT who may be able to treat glottic incompetence for improved airway protection. Pt may also benefit from f/u with OP SLP for voice and swallowing. Wife wrote notes during session which wee accurate. Will f/u while admitted.   HPI HPI: 84 y.o. 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 PET scan on 5/21 shows "Hypermetabolism noted right vocal cord. There is some medial deviation of the posterior left focal cord. This appearance could be related to  FDG injection assuming paralysis of the left cord."      SLP Plan  Continue with current plan of care       Recommendations  Diet recommendations: Thin liquid;Regular Medication Administration: Whole meds with puree Supervision:  Patient able to self feed Compensations: Slow rate;Small sips/bites;Clear throat intermittently;Multiple dry swallows after each bite/sip Postural Changes and/or Swallow Maneuvers: Seated upright 90 degrees                Follow up Recommendations: Outpatient SLP SLP Visit Diagnosis: Dysphagia, oropharyngeal phase (R13.12) Plan: Continue with current plan of care       GO                Hyla Coard, Katherene Ponto 09/18/2019, 9:10 AM

## 2019-09-18 NOTE — Discharge Instructions (Signed)
Follow with Primary MD Wenda Low, MD in 7 days also follow-up with your cardiologist and pulmonary physician within a week.  Review your CT scan findings with your pulmonary physician as well.  Get CBC, CMP, 2 view Chest X ray -  checked next visit within 1 week by Primary MD, follow the reports of your CT scan, chest x-ray and echocardiogram with your primary physician, cardiologist and pulmonary physician.  Activity: As tolerated with Full fall precautions use walker/cane & assistance as needed  Disposition Home    Diet: Heart Healthy    Special Instructions: If you have smoked or chewed Tobacco  in the last 2 yrs please stop smoking, stop any regular Alcohol  and or any Recreational drug use.  On your next visit with your primary care physician please Get Medicines reviewed and adjusted.  Please request your Prim.MD to go over all Hospital Tests and Procedure/Radiological results at the follow up, please get all Hospital records sent to your Prim MD by signing hospital release before you go home.  If you experience worsening of your admission symptoms, develop shortness of breath, life threatening emergency, suicidal or homicidal thoughts you must seek medical attention immediately by calling 911 or calling your MD immediately  if symptoms less severe.  You Must read complete instructions/literature along with all the possible adverse reactions/side effects for all the Medicines you take and that have been prescribed to you. Take any new Medicines after you have completely understood and accpet all the possible adverse reactions/side effects.

## 2019-09-18 NOTE — TOC Transition Note (Signed)
Transition of Care [facility]) - CM/SW Discharge Note   Patient Details  Name: SUSAN ARANA MRN: [id number] Date of Birth: 1935-11-12  [facility]) CM/SW Contact:  Angelita Ingles, RN Phone Number: [phone]  09/18/2019, 12:24 PM   Clinical Narrative:    CM consulted to set up Home healh PT/RN. CM at bedside and explained the recommendation of Home health PT/ RN. Patient states that he wants to enjoy life and continue to do the things he loves to do. Patient explains that he doesn't want to hurt anyone's  feelings but he has a big bicycle and a treadmill at home and that he is ambulatory so he doesn't feel the need to have anyone come into his home. Patient states that he and his wife manage independently at home and visit the MD when necessary. Patient states that he respectfully declines Home health at this time. Message has been sent to primary nurse and Dr. Candiss Norse. Patient does report that he has oxygen through [employer] which he states he generally uses at night but has found that it helps him a little during the day when he wants to do things like gardening. Wife to bring portable oxygen tank for transport home. No further needs identified at this time. CM will sign off.   Final next level of care: Home/Self Care Barriers to Discharge: No Barriers Identified   Patient Goals and CMS Choice Patient states their goals for this hospitalization and ongoing recovery are:: Wants to go home and do the things that he likes to do. CMS Medicare.gov Compare Post Acute Care list provided to:: Patient Choice offered to / list presented to : Patient  Discharge Placement                       Discharge Plan and Services                          HH Arranged: Refused [place] Agency: NA        Social Determinants of Health (SDOH) Interventions     Readmission Risk Interventions No flowsheet data found.

## 2019-09-20 LAB — CULTURE, BLOOD (ROUTINE X 2)
Culture: NO GROWTH
Culture: NO GROWTH
Special Requests: ADEQUATE

## 2019-10-04 ENCOUNTER — Other Ambulatory Visit: Payer: Self-pay

## 2019-10-04 ENCOUNTER — Ambulatory Visit: Payer: Medicare Other | Admitting: Cardiology

## 2019-10-04 ENCOUNTER — Encounter: Payer: Self-pay | Admitting: Cardiology

## 2019-10-04 VITALS — BP 105/62 | HR 65 | Resp 16 | Ht 69.0 in | Wt 185.6 lb

## 2019-10-04 DIAGNOSIS — I5042 Chronic combined systolic (congestive) and diastolic (congestive) heart failure: Secondary | ICD-10-CM

## 2019-10-04 DIAGNOSIS — J432 Centrilobular emphysema: Secondary | ICD-10-CM

## 2019-10-04 DIAGNOSIS — I25118 Atherosclerotic heart disease of native coronary artery with other forms of angina pectoris: Secondary | ICD-10-CM

## 2019-10-04 NOTE — Progress Notes (Signed)
Primary Physician/Referring:  Wenda Low, MD  Patient ID: [name], male    DOB: [birth year], 84 y.o.   MRN: [id number]  Chief Complaint  Patient presents with  . Coronary Artery Disease  . Shortness of Breath  . Follow-up    1 year   HPI:    HPI: [name]  is a 84 y.o. male  Caucasian male with history of non-STEMI and PCI with DES to RCA in 2011 and circumflex stenting in 2013, ischemic cardiomyopathy with mildly reduced LVEF around 45% by echocardiogram on January 2018, hypertension and hyperlipidemia, asymptomatic right carotid stenosis, small 3.3 cm stable AAA since 1980s and followed by his PCP, severe COPD from prior tobacco use and presently on nocturnal and also uses oxygen when he is exerting. He has history of sleep apnea unable to tolerate CPAP multiple medication allergies, chronic back pain and spinal fusion in the past. History of lung cancer status post radiation therapy and follows Dr. Gery Pray since February 2018.   He was admitted on 09/15/2019 and discharged 09/18/2019 after he presented with episode of angina pectoris and aspiration pneumonia, hypoxemia.  Eventually discharged home after treatment with antibiotics also had another episode of angina while in the hospital and hence was started on Ranexa.  He has not had any further episode of angina pectoris although does complain of chest pain when she takes a deep breath.  He continues to have chronic dyspnea on exertion but no PND or orthopnea or leg edema.  He also has had vocal cord partial paralysis since endotracheal intubation and bronchoscopy.  Past Medical History:  Diagnosis Date  . AAA (abdominal aortic aneurysm) (HCC)    measures 2.7 cm.  . Arthritis   . Back pain   . BPH (benign prostatic hypertrophy)   . Bruises easily   . CAD (coronary artery disease)   . Cervical spine fracture ([place]) feb 1990   C 6, C 7 and T 1, no surgery done wore halo brace  . CLL (chronic lymphocytic leukemia)  ([place])    hx of worked up 2009 by dr Benay Spice, no tx done, released by dr Benay Spice  . Complication of anesthesia    cannot lie on right side gets vertigo and trouble turning neck to right  . COPD (chronic obstructive pulmonary disease) ([place]) may 2005  . Erectile dysfunction   . Fatigue   . GERD (gastroesophageal reflux disease)   . Glaucoma   . Hepatitis as child   serum hepatitis  . History of oxygen administration    oxygen  @2   l/m nasally at bedtime.  Marland Kitchen History of radiation therapy 06/22/16-06/28/16   Left upper lobe of lung/ 54 Gy in 3 fractions  . Hyperlipidemia   . Hypertension   . Impaired hearing    wears Hearing aids  . Ischemic cardiomyopathy   . Leg pain   . Lumbar disc disease   . Lung cancer ([place]) dx'd 03/18/16  . Microscopic hematuria   . Myocardial infarct ([place]) 03-17-2009   Hx MI 2000  . Neuropathy    both feet and legs  . Osteopenia   . Pneumonia    hx x2  . Thyroid nodule    Past Surgical History:  Procedure Laterality Date  . APPENDECTOMY  sept, 10, 2005  . BACK SURGERY  2009   L 2 and L 3   . benign cyst removed from left neck  02-2010  . [place] SURGERY  2005-2008  C 2, C 3 and C 4 2005, rods placed C 6, C 7 and T 1  . COLONOSCOPY WITH PROPOFOL N/A 11/26/2014   Procedure: COLONOSCOPY WITH PROPOFOL;  Surgeon: Garlan Fair, MD;  Location: [facility];  Service: Endoscopy;  Laterality: N/A;  . CORONARY ANGIOPLASTY WITH STENT PLACEMENT  2013   1 stent repair, one stent replaced  . ESOPHAGOGASTRODUODENOSCOPY (EGD) WITH PROPOFOL N/A 11/27/2013   Procedure: ESOPHAGOGASTRODUODENOSCOPY (EGD) WITH PROPOFOL;  Surgeon: Garlan Fair, MD;  Location: [facility];  Service: Endoscopy;  Laterality: N/A;  . EYE SURGERY Bilateral july 2011   eye surgry for glaucoma  . FUDUCIAL PLACEMENT N/A 05/12/2016   Procedure: PLACEMENT OF FUDUCIAL;  Surgeon: Melrose Nakayama, MD;  Location: [place];  Service: Thoracic;  Laterality: N/A;  . fusion L 3 and L 4  August 15, 2008  . LEFT HEART CATHETERIZATION WITH CORONARY ANGIOGRAM N/A 08/12/2011   Procedure: LEFT HEART CATHETERIZATION WITH CORONARY ANGIOGRAM;  Surgeon: Larey Dresser, MD;  Location: [facility] CATH LAB;  Service: Cardiovascular;  Laterality: N/A;  . NASAL SINUS SURGERY   oct 2005  . right knee arthroscopy  1981  . stent top heart  2011   x 2  . TONSILLECTOMY  1943  . VIDEO BRONCHOSCOPY WITH ENDOBRONCHIAL NAVIGATION N/A 05/12/2016   Procedure: VIDEO BRONCHOSCOPY WITH ENDOBRONCHIAL NAVIGATION;  Surgeon: Melrose Nakayama, MD;  Location: [place];  Service: Thoracic;  Laterality: N/A;  . VIDEO BRONCHOSCOPY WITH ENDOBRONCHIAL NAVIGATION N/A 08/30/2019   Procedure: VIDEO BRONCHOSCOPY WITH ENDOBRONCHIAL NAVIGATION;  Surgeon: Melrose Nakayama, MD;  Location: [place];  Service: Thoracic;  Laterality: N/A;  . VIDEO BRONCHOSCOPY WITH ENDOBRONCHIAL ULTRASOUND N/A 08/30/2019   Procedure: VIDEO BRONCHOSCOPY WITH ENDOBRONCHIAL ULTRASOUND;  Surgeon: Melrose Nakayama, MD;  Location: [place];  Service: Thoracic;  Laterality: N/A;   Social History   Socioeconomic History  . Marital status: Married    Spouse name: Not on file  . Number of children: 1  . Years of education: Not on file  . Highest education level: Not on file  Occupational History  . Not on file  Tobacco Use  . Smoking status: Former Smoker    Packs/day: 2.00    Years: 55.00    Pack years: 110.00    Types: Cigarettes    Quit date: 07/14/2006    Years since quitting: 13.2  . Smokeless tobacco: Never Used  Vaping Use  . Vaping Use: Never used  Substance and Sexual Activity  . Alcohol use: No  . Drug use: No  . Sexual activity: Not on file  Other Topics Concern  . Not on file  Social History Narrative  . Not on file   Social Determinants of Health   Financial Resource Strain:   . Difficulty of Paying Living Expenses:   Food Insecurity:   . Worried About Charity fundraiser in the Last Year:   . Arboriculturist in the Last Year:    Transportation Needs:   . Film/video editor (Medical):   Marland Kitchen Lack of Transportation (Non-Medical):   Physical Activity:   . Days of Exercise per Week:   . Minutes of Exercise per Session:   Stress:   . Feeling of Stress :   Social Connections:   . Frequency of Communication with Friends and Family:   . Frequency of Social Gatherings with Friends and Family:   . Attends Religious Services:   . Active Member of Clubs or Organizations:   .  Attends Archivist Meetings:   Marland Kitchen Marital Status:   Intimate Partner Violence:   . Fear of Current or Ex-Partner:   . Emotionally Abused:   Marland Kitchen Physically Abused:   . Sexually Abused:    ROS  Review of Systems  HENT: Positive for hoarse voice.   Cardiovascular: Positive for chest pain and dyspnea on exertion. Negative for leg swelling.  Musculoskeletal: Positive for back pain.  Gastrointestinal: Negative for melena.   Objective  Blood pressure (!) 105/62, pulse 65, resp. rate 16, height 5\' 9"  (1.753 m), weight 185 lb 9.6 oz (84.2 kg), SpO2 92 %. Body mass index is 27.41 kg/m.   Physical Exam Constitutional:      General: He is not in acute distress. Eyes:     Conjunctiva/sclera: Conjunctivae normal.  Neck:     Thyroid: No thyromegaly.     Vascular: No JVD.  Cardiovascular:     Rate and Rhythm: Normal rate and regular rhythm.     Pulses:          Carotid pulses are on the right side with bruit and on the left side with bruit.      Femoral pulses are 2+ on the right side and 2+ on the left side.      Popliteal pulses are 1+ on the right side and 1+ on the left side.       Dorsalis pedis pulses are 0 on the right side and 0 on the left side.       Posterior tibial pulses are 0 on the right side and 0 on the left side.     Heart sounds: Normal heart sounds. No murmur heard.  No gallop.      Comments: No leg edema. No JVD Pulmonary:     Effort: Pulmonary effort is normal. No respiratory distress.     Breath sounds: No  wheezing.  Abdominal:     General: Bowel sounds are normal.     Palpations: Abdomen is soft.    Radiology: No results found.  Laboratory examination:   CMP Latest Ref Rng & Units 09/17/2019 09/16/2019 09/15/2019  Glucose 70 - 99 mg/dL 137(H) 123(H) 173(H)  BUN 8 - 23 mg/dL 14 13 12   Creatinine 0.61 - 1.24 mg/dL 0.88 0.87 1.00  Sodium 135 - 145 mmol/L 136 138 140  Potassium 3.5 - 5.1 mmol/L 4.3 5.0 4.6  Chloride 98 - 111 mmol/L 97(L) 99 99  CO2 22 - 32 mmol/L 31 28 29   Calcium 8.9 - 10.3 mg/dL 8.6(L) 8.7(L) 9.2  Total Protein 6.5 - 8.1 g/dL - - -  Total Bilirubin 0.3 - 1.2 mg/dL - - -  Alkaline Phos 38 - 126 U/L - - -  AST 15 - 41 U/L - - -  ALT 0 - 44 U/L - - -   CBC Latest Ref Rng & Units 09/17/2019 09/16/2019 09/15/2019  WBC 4.0 - 10.5 K/uL 11.6(H) 11.6(H) 15.0(H)  Hemoglobin 13.0 - 17.0 g/dL 11.3(L) 12.3(L) 12.8(L)  Hematocrit 39 - 52 % 36.1(L) 39.0 41.3  Platelets 150 - 400 K/uL 181 201 198   Lipid Panel     Component Value Date/Time   CHOL 83 (L) 09/26/2018 1013   TRIG 71 09/26/2018 1013   HDL 38 (L) 09/26/2018 1013   CHOLHDL 2 03/20/2014 1002   VLDL 20.0 03/20/2014 1002   LDLCALC 31 09/26/2018 1013   HEMOGLOBIN A1C No results found for: HGBA1C, MPG TSH No results for input(s): TSH in the last  8760 hours. Medications   Current Outpatient Medications  Medication Instructions  . albuterol (PROAIR HFA) 108 (90 BASE) MCG/ACT inhaler 2 puffs, Inhalation, Every 6 hours PRN  . aspirin 81 mg, Oral, Daily  . CALCIUM-MAGNESIUM-VITAMIN D PO 1 tablet, Oral, Daily  . clopidogrel (PLAVIX) 75 mg, Oral, Daily  . gabapentin (NEURONTIN) 600 mg, Oral, 3 times daily  . hydrOXYzine (ATARAX/VISTARIL) 10 mg, Oral, Every 6 hours PRN  . isosorbide mononitrate (IMDUR) 30 mg, Oral, Daily  . latanoprost (XALATAN) 0.005 % ophthalmic solution 1 drop, Both Eyes, Daily at bedtime  . metoprolol succinate (TOPROL-XL) 12.5 mg, Oral,  Every morning - 10a  . nitroGLYCERIN (NITROSTAT) 0.4 mg,  Sublingual, Every 5 min PRN, For chest pain  . OXYGEN 2 L, Inhalation, Daily at bedtime  . predniSONE (STERAPRED UNI-PAK 21 TAB) 10 mg, Oral, As directed  . ranolazine (RANEXA) 500 mg, Oral, 2 times daily, Call your cardiology office if you have any problems with procuring this medication.  . rosuvastatin (CRESTOR) 5 mg, Oral, Daily  . sucralfate (CARAFATE) 1 g, Oral, 3 times daily with meals & bedtime, 30 mins prior to meals, crush and dissolve in 10 mL of warm water prior to swallowing  . tamsulosin (FLOMAX) 0.4 mg, Oral, Every evening  . timolol (TIMOPTIC) 0.5 % ophthalmic solution 1 drop, Both Eyes, 2 times daily    Cardiac Studies:   Coronary Angiography  PCI  In 2011 (DES to RCA) and [place] and PCI in 07/2011 (DES to proximal CX and balloon angioplasty to proximal RCA in-stent restenosis),  Abdominal Aortic Duplex  07/18/2015: 3.3 cm distal abdominal aortic aneurysm with aneurysmal dilatation over 6 cm length noted. Similar findings noted on priors study. Recommend followup by [place] in 3 years.   Echocardiogram 03/23/2016: Poor echo window. Wall motion abnormality has reduced sensitivity. Left ventricle cavity is normal in size. Mild Diffuse hypokinesis Mild concentric hypertrophy of the left ventricle. Mild decrease in LV systolic function. Grade I diastolic dysfunction. Visual EF is 45%. Calculated EF 38%. Right ventricle cavity is borderline dilated. Normal right ventricular function. Trace tricuspid regurgitation. No evidence of pulmonary hypertension.  Carotid artery duplex 03/27/2018: Moderate amount of plaque at the carotid bulbs and internal carotid arteries, right side greater than left. Estimated degree of stenosis in the internal carotid arteries is less than 50% bilaterally. Patent vertebral arteries with antegrade flow.  EKG:  EKG 10/04/2019: Normal sinus rhythm with rate of 60 bpm, left atrial enlargement, left axis deviation.  T wave abnormality, cannot exclude anteroseptal  ischemia.  No significant change from 04/27/2018, left axis deviation is new.  Assessment     ICD-10-CM   1. Coronary artery disease of native artery of native heart with stable angina pectoris ([place])  I25.118 EKG 12-Lead  2. Chronic combined systolic and diastolic heart failure (HCC)  I50.42   3. Centrilobular emphysema (HCC)  J43.2     Recommendations:   [name]  is a 84 y.o. male  Caucasian male with history of non-STEMI and PCI with DES to RCA in 2011 and circumflex stenting in 2013, ischemic cardiomyopathy with mildly reduced LVEF around 40-45% by echocardiogram on January 2018, hypertension and hyperlipidemia, asymptomatic right carotid stenosis, small 3.3 cm stable AAA since 1980s and followed by his PCP, severe COPD from prior tobacco use and presently on nocturnal and also uses oxygen when he is exerting. He has history of sleep apnea unable to tolerate CPAP multiple medication allergies, chronic back pain and spinal fusion in  the past. History of lung cancer status post radiation therapy and follows Dr. Gery Pray since February 2018.   He was admitted on 09/15/2019 and discharged 09/18/2019 after he presented with episode of angina pectoris and aspiration pneumonia, hypoxemia.  Fortunately he has not had any recurrence of angina pectoris, he has not started Ranexa and advised him that he may not need to use it unless he has recurrence of angina pectoris or has frequent angina that is not relieved with sublingual nitroglycerin.  No clinical evidence of heart failure, dyspnea has remained stable, I will see him back on annual basis.  Fortunately his LVEF is remained stable. He has previously tried Ace inhibitors and also Entresto but had severe drop in blood pressure and does not want to restart the medication.  His labs including lipids, CBC and BMP are stable.   Adrian Prows, MD, [facility] 10/04/2019, 12:49 PM Office: [phone]

## 2019-10-11 ENCOUNTER — Other Ambulatory Visit: Payer: Self-pay | Admitting: Internal Medicine

## 2019-10-11 ENCOUNTER — Ambulatory Visit
Admission: RE | Admit: 2019-10-11 | Discharge: 2019-10-11 | Disposition: A | Discharge status: Home / Self Care | Payer: Medicare Other | Source: Ambulatory Visit | Attending: Internal Medicine | Admitting: Internal Medicine

## 2019-10-11 DIAGNOSIS — J189 Pneumonia, unspecified organism: Secondary | ICD-10-CM

## 2019-10-24 DIAGNOSIS — J9621 Acute and chronic respiratory failure with hypoxia: Secondary | ICD-10-CM

## 2019-10-30 ENCOUNTER — Other Ambulatory Visit: Payer: Self-pay | Admitting: Internal Medicine

## 2019-10-30 ENCOUNTER — Ambulatory Visit
Admission: RE | Admit: 2019-10-30 | Discharge: 2019-10-30 | Disposition: A | Discharge status: Home / Self Care | Payer: Medicare Other | Source: Ambulatory Visit | Attending: Internal Medicine | Admitting: Internal Medicine

## 2019-10-30 DIAGNOSIS — J189 Pneumonia, unspecified organism: Secondary | ICD-10-CM

## 2019-11-07 NOTE — Progress Notes (Signed)
Radiation Oncology         [phone] ________________________________  Name: [name] MRN: [id number]  Date: 11/08/2019  DOB: 04-Mar-1936   Follow-Up Visit Note  CC: Wenda Low, MD  Wenda Low, MD    ICD-10-CM   1. Non-small cell carcinoma of left lung, stage 1 ([facility])  C34.92 CT Chest Wo Contrast  2. Recurrent cancer of left lung of unknown cell type ([facility])  C34.92     Diagnosis: Recurrent non-small cell lung cancer  Interval Since Last Radiation: Seven months, one week, and six days.  Radiation Treatment Dates: 02/13/2019 through 03/28/2019 Site Technique Total Dose (Gy) Dose per Fx (Gy) Completed Fx Beam Energies  Lung, Left: Lung_Lt IMRT 60/60 2 30/30 6X    Narrative:  The patient returns for routine follow-up. Since his last visit, his case was discussed at the multidisciplinary thoracic oncology clinic on 08/16/2019, during which time he was seen in consultation with Dr. Julien Nordmann. At that time, it was recommended that he proceed with MRI of brain followed by repeat bronchoscopy with endobronchial ultrasound.   MRI of brain on 08/28/2019 showed chronic signal changes compatible with small vessel disease. There was no metastatic disease or acute intracranial abnormality identified.  Chest CT scan on 08/29/2019 did not show any significant change in the appearance of the subpleural mass within the medial left lung apex containing fiducial markers or seed implants. The right upper lobe and right lower lobe lung nodules were stable to minimally increased in size (by 1 mm). Again, there was noted to be an FDG avid right thyroid gland nodule. Finally, there were T1 and T4 superior endplate deformities that were new from previous exam.  The patient was seen in consultation with Dr. Roxan Hockey on 08/23/2019 and then proceeded with an endobronchial ultrasound with mediastinal and hilar lymph node aspirations in additional to electromagnetic navigational bronchoscopy with needle  aspirations, brushing, and transbronchial biopsies on 08/30/2019. Pathology from the procedure revealed benign bronchial wall and lung parenchyma without evidence of granulomas or malignancy of the right upper lobe, and scant fragments of benign bronchial wall with few calcifications but no granulomas or malignancy of the left upper lobe. There were no malignant cells identified in the fine needle aspiration of the 4R lymph node, the fine needle aspiration of the 10R lymph node, the fine needle aspiration of the right upper lobe, fine needle aspiration of the left upper lobe, brushing of the right upper lobe, or brushing of the left upper lobe.  The patient was seen in follow-up with Dr. Julien Nordmann on 09/06/2019. Given a completely negative final pathology, the patient was placed under observation. He was also prescribed a Z-pak for suspicion of pneumonia.  Of note, the patient was seen in the ED on 09/13/2019 for possible allergic reaction to Z-pak. He was prescribed Atarax and was found to be stable, so he was discharged home.  The patient was seen in the ED again on 09/15/2019 with complaints of chest pain and shortness of breath. Chest x-ray at that time showed hypoinflation with interval worsening bilateral patchy airspace process over the mid to lower lungs, likely multifocal pneumonia. He was admitted to the hospital for further management. 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 Chest CT scan on that same day showed bilateral, somewhat band-like, heterogeneous airspace opacity and  consolidation that was most conspicuously involving the right middle lobe and peripheral upper lobes. Findings were consistent with multifocal infection and/or aspiration. The consolidation of the medial  apical left upper lobe containing fiducial markers was unchanged post-treatment. Additionally, the 1.6 x 0.8 cm subpleural nodule of the right lung base was unchanged and the prominent mediastinal lymph nodes were also unchanged. The patient was treated, stabilized, and discharged home on 09/18/2019.  Most recent chest x-ray on 10/30/2019 showed persistent areas of airspace opacity in the lingula and right middle lobe regions with possible underlying fibrosis accounting for the opacities. A degree of residual infiltrate could not be excluded. Post-operative and post-radiation changes in the left apex were stable. No new opacity was evident.  On review of systems, he reports using 2L supplemental oxygen as needed and occasional cough. He denies hemoptysis or pain within the chest area.   ALLERGIES:  is allergic to entresto [sacubitril-valsartan], iodinated diagnostic agents, penicillins, sulfonamide derivatives, lumigan [bimatoprost], lyrica [pregabalin], symbicort [budesonide-formoterol fumarate], valsartan, advair diskus [fluticasone-salmeterol], azithromycin, brimonidine tartrate, ciprofloxacin, doxycycline, flovent diskus [fluticasone propionate (inhal)], pulmicort [budesonide], qvar [beclomethasone], spiriva [tiotropium bromide monohydrate], tessalon [benzonatate], bacitracin, flonase [fluticasone propionate], relafen [nabumetone], and tape.  Meds: Current Outpatient Medications  Medication Sig Dispense Refill  . albuterol (PROAIR HFA) 108 (90 BASE) MCG/ACT inhaler Inhale 2 puffs into the lungs every 6 (six) hours as needed for wheezing or shortness of breath.     Marland Kitchen aspirin 81 MG tablet Take 81 mg by mouth daily.     Marland Kitchen gabapentin (NEURONTIN) 300 MG capsule Take 600 mg by mouth 3 (three) times daily.     . hydrOXYzine (ATARAX/VISTARIL) 10 MG tablet Take 1 tablet (10 mg total) by mouth every 6 (six) hours as needed for itching. 30 tablet 0  . isosorbide mononitrate (IMDUR) 30 MG 24 hr tablet Take 1  tablet (30 mg total) by mouth daily. 30 tablet 0  . latanoprost (XALATAN) 0.005 % ophthalmic solution Place 1 drop into both eyes at bedtime.     . metoprolol succinate (TOPROL-XL) 25 MG 24 hr tablet Take 0.5 tablets (12.5 mg total) by mouth every morning. 60 tablet 0  . nitroGLYCERIN (NITROSTAT) 0.4 MG SL tablet Place 0.4 mg under the tongue every 5 (five) minutes as needed for chest pain. For chest pain     . OXYGEN Inhale 2 L into the lungs at bedtime.     . ranolazine (RANEXA) 500 MG 12 hr tablet Take 1 tablet (500 mg total) by mouth 2 (two) times daily. Call your cardiology office if you have any problems with procuring this medication. 30 tablet 0  . Tamsulosin HCl (FLOMAX) 0.4 MG CAPS Take 0.4 mg by mouth every evening.     . timolol (TIMOPTIC) 0.5 % ophthalmic solution Place 1 drop into both eyes 2 (two) times daily.     Marland Kitchen CALCIUM-MAGNESIUM-VITAMIN D PO Take 1 tablet by mouth daily. (Patient not taking: Reported on 11/08/2019)    . clopidogrel (PLAVIX) 75 MG tablet Take 1 tablet (75 mg total) by mouth daily. (Patient not taking: Reported on 11/08/2019) 30 tablet 0  . predniSONE (STERAPRED UNI-PAK 21 TAB) 10 MG (21) TBPK tablet Take 10 mg by mouth as directed. (Patient not taking: Reported on 11/08/2019)    . rosuvastatin (CRESTOR) 5 MG tablet Take 5 mg by mouth daily.     No current facility-administered medications for this encounter.    Physical Findings: The patient is in no acute distress. Patient is alert and  oriented.  height is 5' 11"  (1.803 m) and weight is 178 lb (80.7 kg). His temperature is 98 F (36.7 C). His blood pressure is 120/82 and his pulse is 71. His respiration is 18 and oxygen saturation is 98%.  Lungs are clear to auscultation bilaterally. Heart has regular rate and rhythm. No palpable cervical, supraclavicular, or axillary adenopathy. Abdomen soft, non-tender, normal bowel sounds.  Patient continues to have hoarseness.  Lab Findings: Lab Results  Component Value  Date   WBC 11.6 (H) 09/17/2019   HGB 11.3 (L) 09/17/2019   HCT 36.1 (L) 09/17/2019   MCV 92.6 09/17/2019   PLT 181 09/17/2019    Radiographic Findings: DG Chest 2 View  Result Date: 10/30/2019 CLINICAL DATA:  Recent pneumonia. Reported history of lung carcinoma EXAM: CHEST - 2 VIEW COMPARISON:  Chest radiograph October 11, 2019 and chest CT September 16, 2019 FINDINGS: Patchy airspace opacity remains in portions of the lingula and right middle lobe without progression. No new opacity evident. Scarring with fiducial markers left apex region medially noted. Heart size and pulmonary vascularity are normal. No adenopathy. There is postoperative change in the lower cervical region. IMPRESSION: Persistent areas of airspace opacity in the lingula and right middle lobe regions there may well be underlying fibrosis accounting for these opacities. A degree of residual infiltrate in these areas cannot be excluded by radiography. No new opacity evident. Postoperative/post radiation therapy change in the left apex appears essentially stable. No new opacity evident. Stable cardiac silhouette. Electronically Signed   By: Lowella Grip III M.D.   On: 10/30/2019 14:18   DG Chest 2 View  Result Date: 10/11/2019 CLINICAL DATA:  Shortness of breath EXAM: CHEST - 2 VIEW COMPARISON:  September 16, 2019 chest radiograph and September 16, 2019 chest CT. FINDINGS: There is persistent airspace opacity in the lingula and right middle lobe regions with slight increase in consolidation on the left and similar changes on the right. There is underlying areas of fibrosis, stable, most notable in the right lower lobe. Upper lobes appear clear. Heart size and pulmonary vascularity are normal. No adenopathy. There is aortic atherosclerosis. There is degenerative change in the thoracic spine. There is postoperative change in the visualized cervical spine. IMPRESSION: Persistent areas of airspace opacity concerning for pneumonia in the lingula and right  middle lobe with a degree of increased opacity in the lingula compared to most recent study. Changes in the right middle lobe appear similar. Underlying fibrotic changes noted. Heart size normal.  No adenopathy. Aortic Atherosclerosis (ICD10-I70.0). These results will be called to the ordering clinician or representative by the Radiology Department at the imaging location. Electronically Signed   By: Lowella Grip III M.D.   On: 10/11/2019 11:40    Impression: Recurrent non-small cell lung cancer  No evidence of recurrence on extensive evaluation as above.  Patient continues to have hoarseness related to his partial vocal cord paralysis.  He has met with Dr. Wilburn Cornelia concerning this issue and will meet again with him in October.  Patient appeared to have some problems with the aspiration and as a consequence has altered his intake of liquids.  I discussed with him that injection of the paralyzed vocal cord may help him with his voice as well as helping to avoid problems with aspiration.  This is especially relevant since there is no diagnosis of recurrence on extensive evaluation recently.  Plan: The patient will follow-up with radiation oncology in 2 months.  Prior to this visit he  will also undergo a chest CT scan for further evaluation.  Total time spent in this encounter was 25 minutes which included reviewing the patient's most recent consultations, bronchoscopy, biopsies, pathology reports, ED visits, hospitalization, chest CT scan, chest x-rays, physical examination, and documentation. ____________________________________   Blair Promise, PhD, MD  This document serves as a record of services personally performed by Gery Pray, MD. It was created on his behalf by Clerance Lav, a trained medical scribe. The creation of this record is based on the scribe's personal observations and the provider's statements to them. This document has been checked and approved by the attending  provider.

## 2019-11-08 ENCOUNTER — Encounter: Payer: Self-pay | Admitting: Radiation Oncology

## 2019-11-08 ENCOUNTER — Ambulatory Visit
Admission: RE | Admit: 2019-11-08 | Discharge: 2019-11-08 | Disposition: A | Discharge status: Home / Self Care | Payer: Medicare Other | Source: Ambulatory Visit | Attending: Radiation Oncology | Admitting: Radiation Oncology

## 2019-11-08 ENCOUNTER — Other Ambulatory Visit: Payer: Self-pay

## 2019-11-08 VITALS — BP 120/82 | HR 71 | Temp 98.0°F | Resp 18 | Ht 71.0 in | Wt 178.0 lb

## 2019-11-08 DIAGNOSIS — Z79899 Other long term (current) drug therapy: Secondary | ICD-10-CM | POA: Insufficient documentation

## 2019-11-08 DIAGNOSIS — C3492 Malignant neoplasm of unspecified part of left bronchus or lung: Secondary | ICD-10-CM

## 2019-11-08 DIAGNOSIS — Z923 Personal history of irradiation: Secondary | ICD-10-CM | POA: Insufficient documentation

## 2019-11-08 DIAGNOSIS — C3412 Malignant neoplasm of upper lobe, left bronchus or lung: Secondary | ICD-10-CM | POA: Diagnosis not present

## 2019-11-08 DIAGNOSIS — Z7982 Long term (current) use of aspirin: Secondary | ICD-10-CM | POA: Insufficient documentation

## 2019-11-08 NOTE — Progress Notes (Signed)
Patient here for a f/u visit with Dr. Sondra Come. Reports having pneumonia in July. Patient uses oxygen 2iters prn and qhs. Patient uses thickener for liquids. Reports coughing varies with irritants.  BP 120/82 (BP Location: Right Arm, Patient Position: Sitting, Cuff Size: Normal)   Pulse 71   Temp 98 F (36.7 C)   Resp 18   Ht 5\' 11"  (1.803 m)   Wt 178 lb (80.7 kg)   SpO2 98%   BMI 24.83 kg/m   Wt Readings from Last 3 Encounters:  11/08/19 178 lb (80.7 kg)  10/04/19 185 lb 9.6 oz (84.2 kg)  09/15/19 190 lb (86.2 kg)

## 2020-01-08 ENCOUNTER — Other Ambulatory Visit: Payer: Self-pay

## 2020-01-08 ENCOUNTER — Ambulatory Visit (HOSPITAL_COMMUNITY)
Admission: RE | Admit: 2020-01-08 | Discharge: 2020-01-08 | Disposition: A | Discharge status: Home / Self Care | Payer: Medicare Other | Source: Ambulatory Visit | Attending: Radiation Oncology | Admitting: Radiation Oncology

## 2020-01-08 DIAGNOSIS — C3492 Malignant neoplasm of unspecified part of left bronchus or lung: Secondary | ICD-10-CM | POA: Diagnosis present

## 2020-01-09 NOTE — Progress Notes (Signed)
Radiation Oncology         [phone] ________________________________  Name: [name] MRN: [id number]  Date: 01/10/2020  DOB: 1935/07/04   Follow-Up Visit Note  CC: Wenda Low, MD  Wenda Low, MD    ICD-10-CM   1. Recurrent cancer of left lung of unknown cell type ([facility])  C34.92     Diagnosis: Recurrent non-small cell lung cancer  Interval Since Last Radiation: Nine months, two weeks, and one day  Radiation Treatment Dates: 02/13/2019 through 03/28/2019 Site Technique Total Dose (Gy) Dose per Fx (Gy) Completed Fx Beam Energies  Lung, Left: Lung_Lt IMRT 60/60 2 30/30 6X    Narrative:  The patient returns for routine follow-up. Most recent chest CT scan on 01/08/2020 showed a stable mass-like consolidation in the left upper lobe along the mediastinal pleural surface. There was also noted to be interval improvement in interstitial thickening in the upper lobes. The nodule in the right lung base was stable and the mediastinal adenopathy was similar to prior.  On review of systems, he reports ongoing shortness of breath, dry cough, and hoarse voice. He reports getting his COVID-19 booster shot and flu vaccine recently. He denies weight change and chest pain. Continues to have hoarseness. Will see ENT tomorrow.   ALLERGIES:  is allergic to entresto [sacubitril-valsartan], iodinated diagnostic agents, penicillins, sulfonamide derivatives, lumigan [bimatoprost], lyrica [pregabalin], symbicort [budesonide-formoterol fumarate], valsartan, advair diskus [fluticasone-salmeterol], azithromycin, brimonidine tartrate, ciprofloxacin, doxycycline, flovent diskus [fluticasone propionate (inhal)], pulmicort [budesonide], qvar [beclomethasone], spiriva [tiotropium bromide monohydrate], tessalon [benzonatate], bacitracin, flonase [fluticasone propionate], relafen [nabumetone], and tape.  Meds: Current Outpatient Medications  Medication Sig Dispense Refill  . albuterol (PROAIR HFA) 108  (90 BASE) MCG/ACT inhaler Inhale 2 puffs into the lungs every 6 (six) hours as needed for wheezing or shortness of breath.     Marland Kitchen aspirin 81 MG tablet Take 81 mg by mouth daily.     Marland Kitchen CALCIUM-MAGNESIUM-VITAMIN D PO Take 1 tablet by mouth daily. (Patient not taking: Reported on 11/08/2019)    . clopidogrel (PLAVIX) 75 MG tablet Take 1 tablet (75 mg total) by mouth daily. (Patient not taking: Reported on 11/08/2019) 30 tablet 0  . gabapentin (NEURONTIN) 300 MG capsule Take 600 mg by mouth 3 (three) times daily.     . hydrOXYzine (ATARAX/VISTARIL) 10 MG tablet Take 1 tablet (10 mg total) by mouth every 6 (six) hours as needed for itching. 30 tablet 0  . isosorbide mononitrate (IMDUR) 30 MG 24 hr tablet Take 1 tablet (30 mg total) by mouth daily. 30 tablet 0  . latanoprost (XALATAN) 0.005 % ophthalmic solution Place 1 drop into both eyes at bedtime.     . metoprolol succinate (TOPROL-XL) 25 MG 24 hr tablet Take 0.5 tablets (12.5 mg total) by mouth every morning. 60 tablet 0  . nitroGLYCERIN (NITROSTAT) 0.4 MG SL tablet Place 0.4 mg under the tongue every 5 (five) minutes as needed for chest pain. For chest pain     . OXYGEN Inhale 2 L into the lungs at bedtime.     . ranolazine (RANEXA) 500 MG 12 hr tablet Take 1 tablet (500 mg total) by mouth 2 (two) times daily. Call your cardiology office if you have any problems with procuring this medication. 30 tablet 0  . rosuvastatin (CRESTOR) 5 MG tablet Take 5 mg by mouth daily.    . Tamsulosin HCl (FLOMAX) 0.4 MG CAPS Take 0.4 mg by mouth every evening.     . timolol (TIMOPTIC) 0.5 %  ophthalmic solution Place 1 drop into both eyes 2 (two) times daily.      No current facility-administered medications for this encounter.    Physical Findings: The patient is in no acute distress. Patient is alert and oriented.  height is 5\' 11"  (1.803 m) and weight is 180 lb 8 oz (81.9 kg). His temporal temperature is 97 F (36.1 C) (abnormal). His blood pressure is 137/87  and his pulse is 74. His respiration is 18 and oxygen saturation is 94%.  Lungs are clear to auscultation bilaterally. Heart has regular rate and rhythm. No palpable cervical, supraclavicular, or axillary adenopathy. Abdomen soft, non-tender, normal bowel sounds. Patient continues to have hoarseness.  Lab Findings: Lab Results  Component Value Date   WBC 11.6 (H) 09/17/2019   HGB 11.3 (L) 09/17/2019   HCT 36.1 (L) 09/17/2019   MCV 92.6 09/17/2019   PLT 181 09/17/2019    Radiographic Findings: CT Chest Wo Contrast  Result Date: 01/08/2020 CLINICAL DATA:  Non-small cell lung cancer. Radiation therapy complete 2018. EXAM: CT CHEST WITHOUT CONTRAST TECHNIQUE: Multidetector CT imaging of the chest was performed following the standard protocol without IV contrast. COMPARISON:  09/16/2019 FINDINGS: Cardiovascular: Coronary artery calcification and aortic atherosclerotic calcification. Mediastinum/Nodes: No axillary or supraclavicular adenopathy. No axillary or supraclavicular adenopathy. Prominent mediastinal lymph nodes again noted. For example node along the LEFT mainstem bronchus measuring 1.4 cm compares to 1.0 cm. Subcarinal node measures 8 mm unchanged. RIGHT lower paratracheal node measures 11 mm unchanged. Lungs/Pleura: Triangular consolidation along the LEFT upper lobe mediastinal pleural surface is similar to prior measuring 3.0 by 2.0 cm (image 21/7) compared to 2.8 by 2.2 cm. Fiduciary markers in this lesion. There is some improvement in the linear band like atelectasis and interstitial thickening in the LEFT upper lobe with mild thickening remaining along the fissure (image 66/7). Likewise improvement in the interstitial thickening and peripheral nodularity in the RIGHT upper lobe seen on comparison exam. No new pulmonary nodularity. Within the medial RIGHT lower lobe ovoid nodule measuring 14 mm compares to 16 mm. Upper Abdomen: Limited view of the liver, kidneys, pancreas are unremarkable.  Normal adrenal glands. Musculoskeletal: No aggressive osseous lesion. IMPRESSION: 1. Stable masslike consolidation in the LEFT upper lobe along the mediastinal pleural surface. 2. Interval improvement in interstitial thickening in the upper lobes. 3. Stable nodule in the RIGHT lung base. 4. Mediastinal adenopathy similar prior. Electronically Signed   By: Suzy Bouchard M.D.   On: 01/08/2020 16:56    Impression: Recurrent non-small cell lung cancer  No evidence of recurrence on clinical exam today. Most recent chest CT scan showed a stable mass-like consolidation in the left upper lobe along the mediastinal pleural surface. There was also noted to be interval improvement in interstitial thickening in the upper lobes. The nodule in the right lung base was stable and the mediastinal adenopathy was similar to prior.  Plan: The patient will follow-up with radiation oncology in 6 months. He will undergo a chest CT scan prior to that visit.  Total time spent in this encounter was 21 minutes which included reviewing the patient's most recent chest CT scan, physical examination, ordering of future chest CT scan, and documentation. ____________________________________   Blair Promise, PhD, MD  This document serves as a record of services personally performed by Gery Pray, MD. It was created on his behalf by Clerance Lav, a trained medical scribe. The creation of this record is based on the scribe's personal observations and the provider's  statements to them. This document has been checked and approved by the attending provider.

## 2020-01-10 ENCOUNTER — Encounter: Payer: Self-pay | Admitting: Radiation Oncology

## 2020-01-10 ENCOUNTER — Other Ambulatory Visit: Payer: Self-pay

## 2020-01-10 ENCOUNTER — Ambulatory Visit
Admission: RE | Admit: 2020-01-10 | Discharge: 2020-01-10 | Disposition: A | Discharge status: Home / Self Care | Payer: Medicare Other | Source: Ambulatory Visit | Attending: Radiation Oncology | Admitting: Radiation Oncology

## 2020-01-10 VITALS — BP 137/87 | HR 74 | Temp 97.0°F | Resp 18 | Ht 71.0 in | Wt 180.5 lb

## 2020-01-10 DIAGNOSIS — Z7982 Long term (current) use of aspirin: Secondary | ICD-10-CM | POA: Diagnosis not present

## 2020-01-10 DIAGNOSIS — C3492 Malignant neoplasm of unspecified part of left bronchus or lung: Secondary | ICD-10-CM

## 2020-01-10 DIAGNOSIS — Z79899 Other long term (current) drug therapy: Secondary | ICD-10-CM | POA: Insufficient documentation

## 2020-01-10 DIAGNOSIS — Z923 Personal history of irradiation: Secondary | ICD-10-CM | POA: Diagnosis not present

## 2020-01-10 DIAGNOSIS — C3412 Malignant neoplasm of upper lobe, left bronchus or lung: Secondary | ICD-10-CM | POA: Diagnosis present

## 2020-01-10 NOTE — Progress Notes (Signed)
Patient  Here for a f/u visit with Dr. Sondra Come and to obtain CT results. Has ongoing shortness of breath. Weight is stable.Has a dry cough. Has had his booster Covid vaccine and his flu vaccine recently. He denies pain. Voice is  Hoarse. Patient uses thickener with his liquids daily. Patient denies pain.  BP 137/87 (BP Location: Left Arm, Patient Position: Sitting)   Pulse 74   Temp (!) 97 F (36.1 C) (Temporal)   Resp 18   Ht 5\' 11"  (1.803 m)   Wt 180 lb 8 oz (81.9 kg)   SpO2 94%   BMI 25.17 kg/m   Wt Readings from Last 3 Encounters:  01/10/20 180 lb 8 oz (81.9 kg)  11/08/19 178 lb (80.7 kg)  10/04/19 185 lb 9.6 oz (84.2 kg)

## 2020-01-13 ENCOUNTER — Other Ambulatory Visit: Payer: Self-pay | Admitting: Radiation Oncology

## 2020-01-13 DIAGNOSIS — C3492 Malignant neoplasm of unspecified part of left bronchus or lung: Secondary | ICD-10-CM

## 2020-04-16 ENCOUNTER — Ambulatory Visit
Admission: RE | Admit: 2020-04-16 | Discharge: 2020-04-16 | Disposition: A | Discharge status: Home / Self Care | Payer: Medicare Other | Source: Ambulatory Visit | Attending: Internal Medicine | Admitting: Internal Medicine

## 2020-04-16 ENCOUNTER — Other Ambulatory Visit: Payer: Self-pay | Admitting: Internal Medicine

## 2020-04-16 DIAGNOSIS — M546 Pain in thoracic spine: Secondary | ICD-10-CM | POA: Diagnosis not present

## 2020-04-16 DIAGNOSIS — J449 Chronic obstructive pulmonary disease, unspecified: Secondary | ICD-10-CM | POA: Diagnosis not present

## 2020-04-16 DIAGNOSIS — R091 Pleurisy: Secondary | ICD-10-CM | POA: Diagnosis not present

## 2020-04-16 DIAGNOSIS — Z85118 Personal history of other malignant neoplasm of bronchus and lung: Secondary | ICD-10-CM | POA: Diagnosis not present

## 2020-04-16 DIAGNOSIS — J984 Other disorders of lung: Secondary | ICD-10-CM | POA: Diagnosis not present

## 2020-04-16 DIAGNOSIS — M549 Dorsalgia, unspecified: Secondary | ICD-10-CM

## 2020-04-16 DIAGNOSIS — Z9889 Other specified postprocedural states: Secondary | ICD-10-CM | POA: Diagnosis not present

## 2020-05-01 DIAGNOSIS — I10 Essential (primary) hypertension: Secondary | ICD-10-CM | POA: Diagnosis not present

## 2020-05-01 DIAGNOSIS — I700 Atherosclerosis of aorta: Secondary | ICD-10-CM | POA: Diagnosis not present

## 2020-05-01 DIAGNOSIS — R7309 Other abnormal glucose: Secondary | ICD-10-CM | POA: Diagnosis not present

## 2020-05-01 DIAGNOSIS — I2510 Atherosclerotic heart disease of native coronary artery without angina pectoris: Secondary | ICD-10-CM | POA: Diagnosis not present

## 2020-05-01 DIAGNOSIS — Z85118 Personal history of other malignant neoplasm of bronchus and lung: Secondary | ICD-10-CM | POA: Diagnosis not present

## 2020-05-01 DIAGNOSIS — M810 Age-related osteoporosis without current pathological fracture: Secondary | ICD-10-CM | POA: Diagnosis not present

## 2020-05-01 DIAGNOSIS — I5042 Chronic combined systolic (congestive) and diastolic (congestive) heart failure: Secondary | ICD-10-CM | POA: Diagnosis not present

## 2020-05-01 DIAGNOSIS — Z1389 Encounter for screening for other disorder: Secondary | ICD-10-CM | POA: Diagnosis not present

## 2020-05-01 DIAGNOSIS — I714 Abdominal aortic aneurysm, without rupture: Secondary | ICD-10-CM | POA: Diagnosis not present

## 2020-05-01 DIAGNOSIS — Z0000 Encounter for general adult medical examination without abnormal findings: Secondary | ICD-10-CM | POA: Diagnosis not present

## 2020-05-01 DIAGNOSIS — J449 Chronic obstructive pulmonary disease, unspecified: Secondary | ICD-10-CM | POA: Diagnosis not present

## 2020-05-01 DIAGNOSIS — J9611 Chronic respiratory failure with hypoxia: Secondary | ICD-10-CM | POA: Diagnosis not present

## 2020-05-01 DIAGNOSIS — J3800 Paralysis of vocal cords and larynx, unspecified: Secondary | ICD-10-CM | POA: Diagnosis not present

## 2020-05-01 DIAGNOSIS — I739 Peripheral vascular disease, unspecified: Secondary | ICD-10-CM | POA: Diagnosis not present

## 2020-05-07 DIAGNOSIS — I714 Abdominal aortic aneurysm, without rupture: Secondary | ICD-10-CM | POA: Diagnosis not present

## 2020-05-13 ENCOUNTER — Telehealth: Payer: Self-pay | Admitting: *Deleted

## 2020-05-13 NOTE — Telephone Encounter (Signed)
Returned patient's wife phone call, spoke with patient's wife

## 2020-06-02 ENCOUNTER — Other Ambulatory Visit: Payer: Self-pay | Admitting: Physician Assistant

## 2020-06-02 ENCOUNTER — Ambulatory Visit
Admission: RE | Admit: 2020-06-02 | Discharge: 2020-06-02 | Disposition: A | Discharge status: Home / Self Care | Payer: Medicare Other | Source: Ambulatory Visit | Attending: Physician Assistant | Admitting: Physician Assistant

## 2020-06-02 DIAGNOSIS — R0989 Other specified symptoms and signs involving the circulatory and respiratory systems: Secondary | ICD-10-CM

## 2020-06-02 DIAGNOSIS — J449 Chronic obstructive pulmonary disease, unspecified: Secondary | ICD-10-CM | POA: Diagnosis not present

## 2020-06-02 DIAGNOSIS — R0902 Hypoxemia: Secondary | ICD-10-CM | POA: Diagnosis not present

## 2020-06-02 DIAGNOSIS — D72828 Other elevated white blood cell count: Secondary | ICD-10-CM | POA: Diagnosis not present

## 2020-06-17 DIAGNOSIS — H401133 Primary open-angle glaucoma, bilateral, severe stage: Secondary | ICD-10-CM | POA: Diagnosis not present

## 2020-06-27 DIAGNOSIS — R490 Dysphonia: Secondary | ICD-10-CM | POA: Diagnosis not present

## 2020-06-27 DIAGNOSIS — J3801 Paralysis of vocal cords and larynx, unilateral: Secondary | ICD-10-CM | POA: Diagnosis not present

## 2020-06-30 DIAGNOSIS — D72828 Other elevated white blood cell count: Secondary | ICD-10-CM | POA: Diagnosis not present

## 2020-07-01 ENCOUNTER — Telehealth: Payer: Self-pay | Admitting: *Deleted

## 2020-07-01 NOTE — Telephone Encounter (Signed)
CALLED PATIENT TO INFORM OF CT FOR 07-11-20- ARRIVAL TIME- 12:45 PM @ [facility], NO RESTRICTIONS TO TEST, PATIENT TO RECEIVE RESULTS FROM DR. KINARD ON 07-14-20 @ 10:30 AM, SPOKE WITH PATIENT AND HE IS AWARE OF THESE APPTS.

## 2020-07-07 ENCOUNTER — Encounter: Payer: Self-pay | Admitting: Radiology

## 2020-07-11 ENCOUNTER — Other Ambulatory Visit: Payer: Self-pay

## 2020-07-11 ENCOUNTER — Ambulatory Visit (HOSPITAL_COMMUNITY)
Admission: RE | Admit: 2020-07-11 | Discharge: 2020-07-11 | Disposition: A | Discharge status: Home / Self Care | Payer: Medicare Other | Source: Ambulatory Visit | Attending: Radiation Oncology | Admitting: Radiation Oncology

## 2020-07-11 DIAGNOSIS — C3492 Malignant neoplasm of unspecified part of left bronchus or lung: Secondary | ICD-10-CM

## 2020-07-11 DIAGNOSIS — J432 Centrilobular emphysema: Secondary | ICD-10-CM | POA: Diagnosis not present

## 2020-07-11 DIAGNOSIS — J984 Other disorders of lung: Secondary | ICD-10-CM | POA: Diagnosis not present

## 2020-07-11 DIAGNOSIS — C3490 Malignant neoplasm of unspecified part of unspecified bronchus or lung: Secondary | ICD-10-CM | POA: Diagnosis not present

## 2020-07-11 DIAGNOSIS — I2510 Atherosclerotic heart disease of native coronary artery without angina pectoris: Secondary | ICD-10-CM | POA: Diagnosis not present

## 2020-07-13 NOTE — Progress Notes (Signed)
Radiation Oncology         [phone] ________________________________  Name: [name] MRN: [id number]  Date: 07/14/2020  DOB: 04/06/1935   Follow-Up Visit Note  CC: Wenda Low, MD  Wenda Low, MD    ICD-10-CM   1. Non-small cell carcinoma of left lung, stage 1 [facility])  C34.92     Diagnosis: Recurrent non-small cell lung cancer  Interval Since Last Radiation: One year, three months, two weeks, and five days  Radiation Treatment Dates: 02/13/2019 through 03/28/2019 Site Technique Total Dose (Gy) Dose per Fx (Gy) Completed Fx Beam Energies  Lung, Left: Lung_Lt IMRT 60/60 2 30/30 6X    Narrative:  The patient returns for routine follow-up. Most recent chest CT scan on 07/11/2020 showed radiation changes in the anterior left lung apex with stable pleural-based opacity, grossly unchanged. There was an additional 14 x 17 mm nodule in the posterior right lower lobe that was unchanged.  On review of systems, he reports no new medical issues. he denies any chest pain significant cough or hemoptysis.   ALLERGIES:  is allergic to entresto [sacubitril-valsartan], iodinated diagnostic agents, penicillins, sulfonamide derivatives, lumigan [bimatoprost], lyrica [pregabalin], symbicort [budesonide-formoterol fumarate], valsartan, advair diskus [fluticasone-salmeterol], azithromycin, brimonidine tartrate, ciprofloxacin, doxycycline, flovent diskus [fluticasone propionate (inhal)], pulmicort [budesonide], qvar [beclomethasone], spiriva [tiotropium bromide monohydrate], tessalon [benzonatate], bacitracin, flonase [fluticasone propionate], relafen [nabumetone], and tape.  Meds: Current Outpatient Medications  Medication Sig Dispense Refill  . albuterol (PROAIR HFA) 108 (90 BASE) MCG/ACT inhaler Inhale 2 puffs into the lungs every 6 (six) hours as needed for wheezing or shortness of breath.     Marland Kitchen aspirin 81 MG tablet Take 81 mg by mouth daily.     Marland Kitchen CALCIUM-MAGNESIUM-VITAMIN D PO Take  1 tablet by mouth daily. (Patient not taking: Reported on 11/08/2019)    . clopidogrel (PLAVIX) 75 MG tablet Take 1 tablet (75 mg total) by mouth daily. (Patient not taking: Reported on 11/08/2019) 30 tablet 0  . gabapentin (NEURONTIN) 300 MG capsule Take 600 mg by mouth 3 (three) times daily.     . hydrOXYzine (ATARAX/VISTARIL) 10 MG tablet Take 1 tablet (10 mg total) by mouth every 6 (six) hours as needed for itching. 30 tablet 0  . isosorbide mononitrate (IMDUR) 30 MG 24 hr tablet Take 1 tablet (30 mg total) by mouth daily. 30 tablet 0  . latanoprost (XALATAN) 0.005 % ophthalmic solution Place 1 drop into both eyes at bedtime.     . metoprolol succinate (TOPROL-XL) 25 MG 24 hr tablet Take 0.5 tablets (12.5 mg total) by mouth every morning. 60 tablet 0  . nitroGLYCERIN (NITROSTAT) 0.4 MG SL tablet Place 0.4 mg under the tongue every 5 (five) minutes as needed for chest pain. For chest pain     . OXYGEN Inhale 2 L into the lungs at bedtime.     . ranolazine (RANEXA) 500 MG 12 hr tablet Take 1 tablet (500 mg total) by mouth 2 (two) times daily. Call your cardiology office if you have any problems with procuring this medication. 30 tablet 0  . rosuvastatin (CRESTOR) 5 MG tablet Take 5 mg by mouth daily.    . Tamsulosin HCl (FLOMAX) 0.4 MG CAPS Take 0.4 mg by mouth every evening.     . timolol (TIMOPTIC) 0.5 % ophthalmic solution Place 1 drop into both eyes 2 (two) times daily.      No current facility-administered medications for this encounter.    Physical Findings: The patient is in no acute  distress. Patient is alert and oriented.  height is 5\' 11"  (1.803 m) and weight is 176 lb 12.8 oz (80.2 kg). His temperature is 97.6 F (36.4 C). His blood pressure is 110/70 and his pulse is 67. His respiration is 24 (abnormal) and oxygen saturation is 99%.  Lungs are clear to auscultation bilaterally. Heart has regular rate and rhythm. No palpable cervical, supraclavicular, or axillary adenopathy. Abdomen  soft, non-tender, normal bowel sounds.   Lab Findings: Lab Results  Component Value Date   WBC 11.6 (H) 09/17/2019   HGB 11.3 (L) 09/17/2019   HCT 36.1 (L) 09/17/2019   MCV 92.6 09/17/2019   PLT 181 09/17/2019    Radiographic Findings: CT Chest Wo Contrast  Result Date: 07/11/2020 CLINICAL DATA:  Non-small cell lung cancer EXAM: CT CHEST WITHOUT CONTRAST TECHNIQUE: Multidetector CT imaging of the chest was performed following the standard protocol without IV contrast. COMPARISON:  01/08/2020 FINDINGS: Cardiovascular: Heart is normal in size.  No pericardial effusion. No evidence of thoracic aortic aneurysm. Atherosclerotic calcifications of the aortic arch. Mild prominence of the main pulmonary artery, suggesting pulmonary arterial hypertension. Three vessel coronary atherosclerosis. Mediastinum/Nodes: Small mediastinal nodes, including a 10 mm short axis subcarinal node (series 2/image 70), unchanged. Visualized thyroid is unremarkable. Lungs/Pleura: Fiducial markers in the anterior left lung apex with associated 3.0 x 2.1 cm pleural-based opacity (series 7/image 30), previously 3.4 x 2.2 cm when measured in a similar fashion, grossly unchanged. Left upper lobe volume loss. Moderate centrilobular and paraseptal emphysematous changes, upper lung predominant. Linear scarring in the right upper lobe and lingula. 14 x 7 mm nodule in the posterior right lower lobe (series 7/image 116), unchanged. No focal consolidation. No pleural effusion or pneumothorax. Upper Abdomen: Visualized upper abdomen is notable for a 5.5 cm anterior left upper pole renal cyst and mild vascular calcifications. Musculoskeletal: Degenerative changes of the visualized thoracolumbar spine. Cervical spine fixation hardware, incompletely visualized. IMPRESSION: Radiation changes in the anterior left lung apex with stable pleural-based opacity, grossly unchanged. Additional 14 x 7 mm nodule in the posterior right lower lobe,  unchanged. Aortic Atherosclerosis (ICD10-I70.0) and Emphysema (ICD10-J43.9). Electronically Signed   By: Julian Hy M.D.   On: 07/11/2020 16:44    Impression: Recurrent non-small cell lung cancer  No evidence of recurrence on clinical exam today. Most recent chest CT scan showed radiation changes in the anterior left lung apex with stable pleural-based opacity, grossly unchanged. There was an additional 14 x 17 mm nodule in the posterior right lower lobe that was unchanged.  Plan: The patient will follow-up with radiation oncology in 6 months. He will undergo a chest CT scan prior to that visit.  Total time spent in this encounter was 15 minutes which included reviewing the patient's most recent chest CT scan, physical examination, ordering of future chest CT scan, and documentation. ____________________________________   Blair Promise, PhD, MD  This document serves as a record of services personally performed by Gery Pray, MD. It was created on his behalf by Clerance Lav, a trained medical scribe. The creation of this record is based on the scribe's personal observations and the provider's statements to them. This document has been checked and approved by the attending provider.

## 2020-07-14 ENCOUNTER — Other Ambulatory Visit: Payer: Self-pay

## 2020-07-14 ENCOUNTER — Ambulatory Visit
Admission: RE | Admit: 2020-07-14 | Discharge: 2020-07-14 | Disposition: A | Discharge status: Home / Self Care | Payer: Medicare Other | Source: Ambulatory Visit | Attending: Radiation Oncology | Admitting: Radiation Oncology

## 2020-07-14 VITALS — BP 110/70 | HR 67 | Temp 97.6°F | Resp 24 | Ht 71.0 in | Wt 176.8 lb

## 2020-07-14 DIAGNOSIS — I2510 Atherosclerotic heart disease of native coronary artery without angina pectoris: Secondary | ICD-10-CM | POA: Insufficient documentation

## 2020-07-14 DIAGNOSIS — C3412 Malignant neoplasm of upper lobe, left bronchus or lung: Secondary | ICD-10-CM | POA: Insufficient documentation

## 2020-07-14 DIAGNOSIS — Z79899 Other long term (current) drug therapy: Secondary | ICD-10-CM | POA: Insufficient documentation

## 2020-07-14 DIAGNOSIS — C3492 Malignant neoplasm of unspecified part of left bronchus or lung: Secondary | ICD-10-CM

## 2020-07-14 DIAGNOSIS — Z85118 Personal history of other malignant neoplasm of bronchus and lung: Secondary | ICD-10-CM | POA: Diagnosis not present

## 2020-07-14 DIAGNOSIS — Z7982 Long term (current) use of aspirin: Secondary | ICD-10-CM | POA: Diagnosis not present

## 2020-07-14 DIAGNOSIS — Z08 Encounter for follow-up examination after completed treatment for malignant neoplasm: Secondary | ICD-10-CM | POA: Diagnosis not present

## 2020-07-14 DIAGNOSIS — J432 Centrilobular emphysema: Secondary | ICD-10-CM | POA: Diagnosis not present

## 2020-07-14 NOTE — Progress Notes (Signed)
PAIN: He is currently in no pain.   RESPIRATORY: Coughing  Productive and Color of Phlegm  clear. Pt is on 2 liters/min via Patient connected to nasal cannula oxygen and using oxygen at night and with activity.. Noted no skin abnormalities.  SWALLOWING/DIET: Pt denies dysphagia and uses thickener with fluids. The patient eats a regular, healthy diet..  OTHER: Pt complains of weakness. Denies fatigue or difficulty sleeping. Appetite is good..  BP 110/70 (BP Location: Left Arm, Patient Position: Sitting, Cuff Size: Large)   Pulse 67   Temp 97.6 F (36.4 C)   Resp (!) 24   Ht 5\' 11"  (1.803 m)   Wt 176 lb 12.8 oz (80.2 kg)   SpO2 99%   PF (!) 2 L/min   BMI 24.66 kg/m    Wt Readings from Last 3 Encounters:  07/14/20 176 lb 12.8 oz (80.2 kg)  01/10/20 180 lb 8 oz (81.9 kg)  11/08/19 178 lb (80.7 kg)

## 2020-07-15 ENCOUNTER — Other Ambulatory Visit: Payer: Self-pay | Admitting: Radiation Oncology

## 2020-07-15 DIAGNOSIS — C3492 Malignant neoplasm of unspecified part of left bronchus or lung: Secondary | ICD-10-CM

## 2020-09-29 ENCOUNTER — Ambulatory Visit: Payer: Medicare Other | Admitting: Cardiology

## 2020-09-29 ENCOUNTER — Encounter: Payer: Self-pay | Admitting: Cardiology

## 2020-09-29 ENCOUNTER — Other Ambulatory Visit: Payer: Self-pay

## 2020-09-29 VITALS — BP 118/68 | HR 77 | Temp 98.0°F | Resp 16 | Ht 71.0 in | Wt 176.2 lb

## 2020-09-29 DIAGNOSIS — I25118 Atherosclerotic heart disease of native coronary artery with other forms of angina pectoris: Secondary | ICD-10-CM | POA: Diagnosis not present

## 2020-09-29 DIAGNOSIS — I5042 Chronic combined systolic (congestive) and diastolic (congestive) heart failure: Secondary | ICD-10-CM | POA: Diagnosis not present

## 2020-09-29 DIAGNOSIS — Z66 Do not resuscitate: Secondary | ICD-10-CM

## 2020-09-29 DIAGNOSIS — E7800 Pure hypercholesterolemia, unspecified: Secondary | ICD-10-CM

## 2020-09-29 DIAGNOSIS — J432 Centrilobular emphysema: Secondary | ICD-10-CM | POA: Diagnosis not present

## 2020-09-29 MED ORDER — ROSUVASTATIN CALCIUM 5 MG PO TABS
5.0000 mg | ORAL_TABLET | Freq: Every day | ORAL | 3 refills | Status: DC
Start: 2020-09-29 — End: 2022-10-01

## 2020-09-29 NOTE — Progress Notes (Signed)
Primary Physician/Referring:  Wenda Low, MD  Patient ID: [name], male    DOB: 08-06-1935, 85 y.o.   MRN: [id number]  Chief Complaint  Patient presents with   Coronary Artery Disease   Follow-up    1 year   Shortness of Breath   HPI:    HPI: [name]  is a 85 y.o. male  Caucasian male with history of non-STEMI and PCI with DES to RCA in 2011 and circumflex stenting in 2013, ischemic cardiomyopathy with mildly reduced LVEF around 45% by echocardiogram on January 2018, hypertension and hyperlipidemia, asymptomatic right carotid stenosis, small 3.3 cm stable AAA since 1980s and followed by his PCP, severe COPD from prior tobacco use and presently on nocturnal and also uses oxygen when he is exerting. He has history of sleep apnea unable to tolerate CPAP multiple medication allergies, chronic back pain and spinal fusion in the past. History of lung cancer status post radiation therapy and follows Dr. Gery Pray since February 2018.   He presents for annual visit, presently doing well and has not had any recent hospitalization for any decompensated heart failure or angina pectoris.  Except for mild chronic dyspnea he has no specific complaints. No PND or orthopnea or leg edema.  He also has had vocal cord partial paralysis since endotracheal intubation and bronchoscopy.  Past Medical History:  Diagnosis Date   AAA (abdominal aortic aneurysm) (HCC)    measures 2.7 cm.   Arthritis    Back pain    BPH (benign prostatic hypertrophy)    Bruises easily    CAD (coronary artery disease)    Cervical spine fracture ([place]) feb 1990   C 6, C 7 and T 1, no surgery done wore halo brace   CLL (chronic lymphocytic leukemia) ([place])    hx of worked up 2009 by dr Benay Spice, no tx done, released by dr sherrill   Complication of anesthesia    cannot lie on right side gets vertigo and trouble turning neck to right   COPD (chronic obstructive pulmonary disease) ([place]) may 2005    Erectile dysfunction    Fatigue    GERD (gastroesophageal reflux disease)    Glaucoma    Hepatitis as child   serum hepatitis   History of oxygen administration    oxygen  @2   l/m nasally at bedtime.   History of radiation therapy 06/22/16, 06/24/2016, 06/28/16   Left upper lobe of lung/ 54 Gy in 3 fractions   History of radiation therapy 02/13/2019-03/28/2019   IMRT to left lung    Dr Gery Pray   Hyperlipidemia    Hypertension    Impaired hearing    wears Hearing aids   Ischemic cardiomyopathy    Leg pain    Lumbar disc disease    Lung cancer ([place]) dx'd 03/18/16   Microscopic hematuria    Myocardial infarct ([place]) 03-17-2009   Hx MI 2000   Neuropathy    both feet and legs   Osteopenia    Pneumonia    hx x2   Thyroid nodule    Past Surgical History:  Procedure Laterality Date   APPENDECTOMY  sept, 10, 2005   BACK SURGERY  2009   L 2 and L 3    benign cyst removed from left neck  02-2010   Bruno SURGERY  2005-2008   C 2, C 3 and C 4 2005, rods placed C 6, C 7 and T 1   COLONOSCOPY WITH PROPOFOL  N/A 11/26/2014   Procedure: COLONOSCOPY WITH PROPOFOL;  Surgeon: Garlan Fair, MD;  Location: [facility];  Service: Endoscopy;  Laterality: N/A;   CORONARY ANGIOPLASTY WITH STENT PLACEMENT  2013   1 stent repair, one stent replaced   ESOPHAGOGASTRODUODENOSCOPY (EGD) WITH PROPOFOL N/A 11/27/2013   Procedure: ESOPHAGOGASTRODUODENOSCOPY (EGD) WITH PROPOFOL;  Surgeon: Garlan Fair, MD;  Location: [facility];  Service: Endoscopy;  Laterality: N/A;   EYE SURGERY Bilateral july 2011   eye surgry for glaucoma   FUDUCIAL PLACEMENT N/A 05/12/2016   Procedure: PLACEMENT OF FUDUCIAL;  Surgeon: Melrose Nakayama, MD;  Location: [facility];  Service: Thoracic;  Laterality: N/A;   fusion L 3 and L 4  August 15, 2008   LEFT HEART CATHETERIZATION WITH CORONARY ANGIOGRAM N/A 08/12/2011   Procedure: LEFT HEART CATHETERIZATION WITH CORONARY ANGIOGRAM;  Surgeon: Larey Dresser, MD;   Location: [facility] CATH LAB;  Service: Cardiovascular;  Laterality: N/A;   NASAL SINUS SURGERY   oct 2005   right knee arthroscopy  1981   stent top heart  2011   x 2   TONSILLECTOMY  1943   VIDEO BRONCHOSCOPY WITH ENDOBRONCHIAL NAVIGATION N/A 05/12/2016   Procedure: VIDEO BRONCHOSCOPY WITH ENDOBRONCHIAL NAVIGATION;  Surgeon: Melrose Nakayama, MD;  Location: [facility];  Service: Thoracic;  Laterality: N/A;   VIDEO BRONCHOSCOPY WITH ENDOBRONCHIAL NAVIGATION N/A 08/30/2019   Procedure: VIDEO BRONCHOSCOPY WITH ENDOBRONCHIAL NAVIGATION;  Surgeon: Melrose Nakayama, MD;  Location: [facility];  Service: Thoracic;  Laterality: N/A;   VIDEO BRONCHOSCOPY WITH ENDOBRONCHIAL ULTRASOUND N/A 08/30/2019   Procedure: VIDEO BRONCHOSCOPY WITH ENDOBRONCHIAL ULTRASOUND;  Surgeon: Melrose Nakayama, MD;  Location: [facility];  Service: Thoracic;  Laterality: N/A;   Social History   Tobacco Use   Smoking status: Former    Packs/day: 2.00    Years: 55.00    Pack years: 110.00    Types: Cigarettes    Quit date: 07/14/2006    Years since quitting: 14.2   Smokeless tobacco: Never  Substance Use Topics   Alcohol use: No  Marital Status: Married    ROS  Review of Systems  HENT:  Positive for hoarse voice.   Cardiovascular:  Positive for dyspnea on exertion. Negative for chest pain, claudication and leg swelling.  Musculoskeletal:  Positive for back pain.  Gastrointestinal:  Negative for melena.  Objective  Blood pressure 118/68, pulse 77, temperature 98 F (36.7 C), temperature source Temporal, resp. rate 16, height 5\' 11"  (1.803 m), weight 176 lb 3.2 oz (79.9 kg), SpO2 94 %. Body mass index is 24.57 kg/m.   Physical Exam Constitutional:      General: He is not in acute distress. Eyes:     Conjunctiva/sclera: Conjunctivae normal.  Neck:     Thyroid: No thyromegaly.     Vascular: Carotid bruit (Bilateral) present. No JVD.  Cardiovascular:     Rate and Rhythm: Normal rate and regular rhythm.     Pulses:           Femoral pulses are 2+ on the right side and 2+ on the left side.      Popliteal pulses are 1+ on the right side and 1+ on the left side.       Dorsalis pedis pulses are 0 on the right side and 0 on the left side.       Posterior tibial pulses are 0 on the right side and 0 on the left side.     Heart sounds: Normal heart  sounds. No murmur heard.   No gallop.  Pulmonary:     Effort: Pulmonary effort is normal. No respiratory distress.     Breath sounds: No wheezing.  Abdominal:     General: Bowel sounds are normal.     Palpations: Abdomen is soft.  Musculoskeletal:     Right lower leg: No edema.     Left lower leg: No edema.  Skin:    Capillary Refill: Capillary refill takes less than 2 seconds.   Radiology: No results found.  Laboratory examination:   External labs:  Cholesterol, total 156.000 m 05/01/2020 HDL 48.000 mg 05/01/2020 LDL 88.000 mg 05/01/2020 Triglycerides 110.000 m 05/01/2020  A1C 6.300 % 05/01/2020 TSH 1.570 05/01/2020  Hemoglobin 14.800 g/d 06/30/2020  Creatinine, Serum 0.970 mg/ 05/01/2020 Potassium 4.700 mm 05/01/2020 Magnesium N/D ALT (SGPT) 8.000 U/L 05/01/2020  Medications   Current Outpatient Medications  Medication Instructions   albuterol (VENTOLIN HFA) 108 (90 Base) MCG/ACT inhaler 2 puffs, Inhalation, Every 6 hours PRN   aspirin 81 mg, Oral, Daily   gabapentin (NEURONTIN) 600 mg, Oral, 3 times daily   latanoprost (XALATAN) 0.005 % ophthalmic solution 1 drop, Both Eyes, Daily at bedtime   Maltodextrin-Xanthan Gum (RESOURCE THICKENUP CLEAR) POWD Oral, As needed   metoprolol succinate (TOPROL-XL) 12.5 mg, Oral, Every morning   nitroGLYCERIN (NITROSTAT) 0.4 mg, Sublingual, Every 5 min PRN, For chest pain   OXYGEN 2 L, Inhalation, Daily at bedtime   tamsulosin (FLOMAX) 0.4 mg, Oral, Every evening   timolol (TIMOPTIC) 0.5 % ophthalmic solution 1 drop, Both Eyes, 2 times daily   Cardiac Studies:   Coronary Angiography  PCI  In 2011 (DES to RCA) and  [place] and PCI in 07/2011 (DES to proximal CX and balloon angioplasty to proximal RCA in-stent restenosis),  Abdominal Aortic Duplex  07/18/2015: 3.3 cm distal abdominal aortic aneurysm with aneurysmal dilatation over 6 cm length noted. Similar findings noted on priors study. Recommend followup by [place] in 3 years.   Carotid artery duplex 03/27/2018: Moderate amount of plaque at the carotid bulbs and internal carotid arteries, right side greater than left. Estimated degree of stenosis in the internal carotid arteries is less than 50% bilaterally. Patent vertebral arteries with antegrade flow.  Echocardiogram 09/15/2019:  1. Left ventricular ejection fraction, by estimation, is 40 to 45%. Left ventricular ejection fraction by 2D MOD biplane is 35.4 %. The left ventricle has mildly decreased function. The left ventricle demonstrates regional wall motion abnormalities (see  scoring diagram/findings for description). Left ventricular diastolic parameters are consistent with Grade I diastolic dysfunction (impaired relaxation). There is mild hypokinesis of the left ventricular, entire inferolateral wall.  2. Right ventricular systolic function is normal. The right ventricular size is normal.  3. The mitral valve is normal in structure. No evidence of mitral valve regurgitation. No evidence of mitral stenosis.  4. The aortic valve is normal in structure. Aortic valve regurgitation is not visualized. No aortic stenosis is present.  5. The inferior vena cava is dilated in size with >50% respiratory variability, suggesting right atrial pressure of 8 mmHg.  EKG:  EKG 09/29/2020: Normal sinus rhythm at rate of 75 bpm, LAA, leftward axis, incomplete right bundle branch block. No significant change from   EKG 10/04/2019: Normal sinus rhythm with rate of 60 bpm, left atrial enlargement, left axis deviation.  T wave abnormality, cannot exclude anteroseptal ischemia.  No significant change from 04/27/2018, left axis  deviation is new.  Assessment     ICD-10-CM  1. Coronary artery disease of native artery of native heart with stable angina pectoris ([place])  I25.118 EKG 12-Lead    2. Chronic combined systolic and diastolic heart failure (HCC)  I50.42     3. Centrilobular emphysema ([place])  J43.2     4. DNR (do not resuscitate)  Z66       Recommendations:   DEMETRIOS BYRON  is a 85 y.o. male  Caucasian male with history of non-STEMI and PCI with DES to RCA in 2011 and circumflex stenting in 2013, ischemic cardiomyopathy with mildly reduced LVEF around 40-45% by echocardiogram on January 2018, hypertension and hyperlipidemia, asymptomatic right carotid stenosis, small 3.3 cm stable AAA since 1980s and followed by his PCP, severe COPD from prior tobacco use and presently on nocturnal and also uses oxygen when he is exerting. He has history of sleep apnea unable to tolerate CPAP multiple medication allergies, chronic back pain and spinal fusion in the past. History of lung cancer status post radiation therapy and follows Dr. Gery Pray since February 2018.  He presents for annual visit, presently doing well and has not had any recent hospitalization for any decompensated heart failure or angina pectoris.  He was previously on low-dose of a statin in view of CAD, it is not on the list, I have started him on rosuvastatin 5 mg daily.  Advised him to continue this indefinitely.  Otherwise blood pressure is well controlled, no clinical evidence of heart failure, no changes in his EKG.  I will see him back on an annual basis.  He has previously tried Ace inhibitors and also Entresto but had severe drop in blood pressure and does not want to restart the medication.  His labs including lipids, CBC and BMP are stable.   Adrian Prows, MD, [facility] 09/29/2020, 11:13 AM Office: [phone]

## 2020-10-03 ENCOUNTER — Ambulatory Visit: Payer: Medicare Other | Admitting: Cardiology

## 2020-10-29 DIAGNOSIS — I2510 Atherosclerotic heart disease of native coronary artery without angina pectoris: Secondary | ICD-10-CM | POA: Diagnosis not present

## 2020-10-29 DIAGNOSIS — I739 Peripheral vascular disease, unspecified: Secondary | ICD-10-CM | POA: Diagnosis not present

## 2020-10-29 DIAGNOSIS — R7303 Prediabetes: Secondary | ICD-10-CM | POA: Diagnosis not present

## 2020-10-29 DIAGNOSIS — I5042 Chronic combined systolic (congestive) and diastolic (congestive) heart failure: Secondary | ICD-10-CM | POA: Diagnosis not present

## 2020-10-29 DIAGNOSIS — I700 Atherosclerosis of aorta: Secondary | ICD-10-CM | POA: Diagnosis not present

## 2020-10-29 DIAGNOSIS — Z85118 Personal history of other malignant neoplasm of bronchus and lung: Secondary | ICD-10-CM | POA: Diagnosis not present

## 2020-10-29 DIAGNOSIS — J9611 Chronic respiratory failure with hypoxia: Secondary | ICD-10-CM | POA: Diagnosis not present

## 2020-10-29 DIAGNOSIS — I714 Abdominal aortic aneurysm, without rupture: Secondary | ICD-10-CM | POA: Diagnosis not present

## 2020-10-29 DIAGNOSIS — J449 Chronic obstructive pulmonary disease, unspecified: Secondary | ICD-10-CM | POA: Diagnosis not present

## 2020-10-29 DIAGNOSIS — J3800 Paralysis of vocal cords and larynx, unspecified: Secondary | ICD-10-CM | POA: Diagnosis not present

## 2020-10-29 DIAGNOSIS — I10 Essential (primary) hypertension: Secondary | ICD-10-CM | POA: Diagnosis not present

## 2020-10-29 DIAGNOSIS — C9110 Chronic lymphocytic leukemia of B-cell type not having achieved remission: Secondary | ICD-10-CM | POA: Diagnosis not present

## 2020-12-01 ENCOUNTER — Encounter (INDEPENDENT_AMBULATORY_CARE_PROVIDER_SITE_OTHER): Payer: Self-pay

## 2020-12-12 ENCOUNTER — Telehealth: Payer: Self-pay | Admitting: *Deleted

## 2020-12-12 NOTE — Telephone Encounter (Signed)
RETURNED PATIENT'S PHONE [name], SPOKE WITH PATIENT'S WIFE- [name]

## 2020-12-23 ENCOUNTER — Ambulatory Visit (INDEPENDENT_AMBULATORY_CARE_PROVIDER_SITE_OTHER): Payer: Medicare Other | Admitting: Ophthalmology

## 2020-12-23 ENCOUNTER — Other Ambulatory Visit: Payer: Self-pay

## 2020-12-23 ENCOUNTER — Encounter (INDEPENDENT_AMBULATORY_CARE_PROVIDER_SITE_OTHER): Payer: Self-pay

## 2020-12-23 ENCOUNTER — Encounter (INDEPENDENT_AMBULATORY_CARE_PROVIDER_SITE_OTHER): Payer: Self-pay | Admitting: Ophthalmology

## 2020-12-23 DIAGNOSIS — H353132 Nonexudative age-related macular degeneration, bilateral, intermediate dry stage: Secondary | ICD-10-CM | POA: Insufficient documentation

## 2020-12-23 DIAGNOSIS — J449 Chronic obstructive pulmonary disease, unspecified: Secondary | ICD-10-CM

## 2020-12-23 DIAGNOSIS — G4733 Obstructive sleep apnea (adult) (pediatric): Secondary | ICD-10-CM | POA: Diagnosis not present

## 2020-12-23 NOTE — Assessment & Plan Note (Signed)
This condition with deprivation of oxygen may have some diffuse effect upon the patient's macular perfusion long-term

## 2020-12-23 NOTE — Progress Notes (Signed)
12/23/2020     CHIEF COMPLAINT Patient presents for  Chief Complaint  Patient presents with   Retina Evaluation      HISTORY OF PRESENT ILLNESS: [name] is a 85 y.o. male who presents to the clinic today for:   HPI     Retina Evaluation   In both eyes.  This started 3 weeks ago.  Associated Symptoms Negative for Flashes, Floaters, Distortion, Blind Spot and Photophobia.        Comments   NP- retinal evaluation- referred by Dr.Thurmond. Pt saw Dr. Gwynn Burly 12/01/2020. Pt is allergic to Ciprofloxacin. Pt uses latanoprost OU QHS, and timolol OU BID. Dr. Gwynn Burly referred to Dr Zadie Rhine for Macular Degeneration. Pt states "when I go into a darkened area, I can't see well. It seems like it is getting worse. I woke up one night and it looked like a dim light flickering slowly but it has been about 2 months since."       Last edited by Laurin Coder on 12/23/2020  2:38 PM.      Referring physician: Odette Fraction [place],  [place] [address]  HISTORICAL INFORMATION:   Selected notes from the MEDICAL RECORD NUMBER       CURRENT MEDICATIONS: Current Outpatient Medications (Ophthalmic Drugs)  Medication Sig   latanoprost (XALATAN) 0.005 % ophthalmic solution Place 1 drop into both eyes at bedtime.    timolol (TIMOPTIC) 0.5 % ophthalmic solution Place 1 drop into both eyes 2 (two) times daily.    No current facility-administered medications for this visit. (Ophthalmic Drugs)   Current Outpatient Medications (Other)  Medication Sig   albuterol (VENTOLIN HFA) 108 (90 Base) MCG/ACT inhaler Inhale 2 puffs into the lungs every 6 (six) hours as needed for wheezing or shortness of breath.    aspirin 81 MG tablet Take 81 mg by mouth daily.    gabapentin (NEURONTIN) 300 MG capsule Take 600 mg by mouth 3 (three) times daily.    Maltodextrin-Xanthan Gum (RESOURCE THICKENUP CLEAR) POWD Take by mouth as needed.   metoprolol succinate (TOPROL-XL) 25 MG  24 hr tablet Take 0.5 tablets (12.5 mg total) by mouth every morning.   nitroGLYCERIN (NITROSTAT) 0.4 MG SL tablet Place 0.4 mg under the tongue every 5 (five) minutes as needed for chest pain. For chest pain    OXYGEN Inhale 2 L into the lungs at bedtime.    rosuvastatin (CRESTOR) 5 MG tablet Take 1 tablet (5 mg total) by mouth daily.   tamsulosin (FLOMAX) 0.4 MG CAPS capsule Take 0.4 mg by mouth every evening.    No current facility-administered medications for this visit. (Other)      REVIEW OF SYSTEMS:    ALLERGIES Allergies  Allergen Reactions   Entresto [Sacubitril-Valsartan] Diarrhea    Patient had diarrhea, hyptotensive,dizziness,headache and lethargic   Iodinated Diagnostic Agents Swelling and Other (See Comments)    Patient experienced tongue tingling, followed by swelling under tongue and in throat area, also patient had eye redness/irriation - Isovve Nonionic Contrast 75U   Penicillins Swelling    SWELLING OF TONGUE   Sulfonamide Derivatives Swelling    SWELLING HANDS   Lumigan [Bimatoprost] Swelling    EYES SWELL   Lyrica [Pregabalin] Other (See Comments)    TREMORS CONFUSION   Symbicort [Budesonide-Formoterol Fumarate] Itching and Swelling   Valsartan Other (See Comments)    BP drop   Advair Diskus [Fluticasone-Salmeterol] Itching   Azithromycin Itching    Itching from  head to toe   Brimonidine Tartrate     unknown   Ciprofloxacin     Patient does not want to have this anymore(does not want any forms of this) patient reports having C Diff after taking this   Doxycycline Diarrhea   Flovent Diskus [Fluticasone Propionate (Inhal)] Itching   Pulmicort [Budesonide] Itching and Swelling   Qvar [Beclomethasone] Itching and Swelling   Spiriva [Tiotropium Bromide Monohydrate] Itching and Swelling   Tessalon [Benzonatate] Other (See Comments)    Dizzy/skin reaction   Bacitracin Itching, Swelling and Anxiety   Flonase [Fluticasone Propionate] Other (See Comments)     NOSE BLEED   Relafen [Nabumetone] Rash    REACTION: Reaction not known to relafen   Tape Itching, Rash and Other (See Comments)    Skin tear, please use paper tape    PAST MEDICAL HISTORY Past Medical History:  Diagnosis Date   AAA (abdominal aortic aneurysm)    measures 2.7 cm.   Arthritis    Back pain    BPH (benign prostatic hypertrophy)    Bruises easily    CAD (coronary artery disease)    Cervical spine fracture ([facility]) feb 1990   C 6, C 7 and T 1, no surgery done wore halo brace   CLL (chronic lymphocytic leukemia) ([facility])    hx of worked up 2009 by dr Benay Spice, no tx done, released by dr sherrill   Complication of anesthesia    cannot lie on right side gets vertigo and trouble turning neck to right   COPD (chronic obstructive pulmonary disease) ([facility]) may 2005   Erectile dysfunction    Fatigue    GERD (gastroesophageal reflux disease)    Glaucoma    Hepatitis as child   serum hepatitis   History of oxygen administration    oxygen  @2   l/m nasally at bedtime.   History of radiation therapy 06/22/16, 06/24/2016, 06/28/16   Left upper lobe of lung/ 54 Gy in 3 fractions   History of radiation therapy 02/13/2019-03/28/2019   IMRT to left lung    Dr Gery Pray   Hyperlipidemia    Hypertension    Impaired hearing    wears Hearing aids   Ischemic cardiomyopathy    Leg pain    Lumbar disc disease    Lung cancer ([facility]) dx'd 03/18/16   Microscopic hematuria    Myocardial infarct ([facility]) 03-17-2009   Hx MI 2000   Neuropathy    both feet and legs   Osteopenia    Pneumonia    hx x2   Thyroid nodule    Past Surgical History:  Procedure Laterality Date   APPENDECTOMY  sept, 10, 2005   BACK SURGERY  2009   L 2 and L 3    benign cyst removed from left neck  02-2010   [place]  2005-2008   C 2, C 3 and C 4 2005, rods placed C 6, C 7 and T 1   COLONOSCOPY WITH PROPOFOL N/A 11/26/2014   Procedure: COLONOSCOPY WITH PROPOFOL;  Surgeon: Garlan Fair, MD;   Location: [facility];  Service: Endoscopy;  Laterality: N/A;   CORONARY ANGIOPLASTY WITH STENT PLACEMENT  2013   1 stent repair, one stent replaced   ESOPHAGOGASTRODUODENOSCOPY (EGD) WITH PROPOFOL N/A 11/27/2013   Procedure: ESOPHAGOGASTRODUODENOSCOPY (EGD) WITH PROPOFOL;  Surgeon: Garlan Fair, MD;  Location: [facility];  Service: Endoscopy;  Laterality: N/A;   EYE SURGERY Bilateral july 2011   eye surgry for glaucoma  FUDUCIAL PLACEMENT N/A 05/12/2016   Procedure: PLACEMENT OF FUDUCIAL;  Surgeon: Melrose Nakayama, MD;  Location: [facility];  Service: Thoracic;  Laterality: N/A;   fusion L 3 and L 4  August 15, 2008   LEFT HEART CATHETERIZATION WITH CORONARY ANGIOGRAM N/A 08/12/2011   Procedure: LEFT HEART CATHETERIZATION WITH CORONARY ANGIOGRAM;  Surgeon: Larey Dresser, MD;  Location: [facility] CATH LAB;  Service: Cardiovascular;  Laterality: N/A;   NASAL SINUS SURGERY   oct 2005   right knee arthroscopy  1981   stent top heart  2011   x 2   TONSILLECTOMY  1943   VIDEO BRONCHOSCOPY WITH ENDOBRONCHIAL NAVIGATION N/A 05/12/2016   Procedure: VIDEO BRONCHOSCOPY WITH ENDOBRONCHIAL NAVIGATION;  Surgeon: Melrose Nakayama, MD;  Location: [facility];  Service: Thoracic;  Laterality: N/A;   VIDEO BRONCHOSCOPY WITH ENDOBRONCHIAL NAVIGATION N/A 08/30/2019   Procedure: VIDEO BRONCHOSCOPY WITH ENDOBRONCHIAL NAVIGATION;  Surgeon: Melrose Nakayama, MD;  Location: [facility];  Service: Thoracic;  Laterality: N/A;   VIDEO BRONCHOSCOPY WITH ENDOBRONCHIAL ULTRASOUND N/A 08/30/2019   Procedure: VIDEO BRONCHOSCOPY WITH ENDOBRONCHIAL ULTRASOUND;  Surgeon: Melrose Nakayama, MD;  Location: [facility];  Service: Thoracic;  Laterality: N/A;    FAMILY HISTORY Family History  Problem Relation Age of Onset   Alzheimer's disease Mother        died of natural causes   Lung cancer Father 8       died.    Cancer Brother        jaw   Renal cancer Brother    Lung cancer Brother     SOCIAL HISTORY Social History    Tobacco Use   Smoking status: Former    Packs/day: 2.00    Years: 55.00    Pack years: 110.00    Types: Cigarettes    Quit date: 07/14/2006    Years since quitting: 14.4   Smokeless tobacco: Never  Vaping Use   Vaping Use: Never used  Substance Use Topics   Alcohol use: No   Drug use: No         OPHTHALMIC EXAM:  Base Eye Exam     Visual Acuity (ETDRS)       Right Left   Dist cc 20/40 +2 20/30 -1   Dist ph cc 20/30     Correction: Glasses         Tonometry (Tonopen, 2:41 PM)       Right Left   Pressure 18 15         Pupils       Pupils Dark Light Shape React APD   Right PERRL 2.5 2 Round Minimal None   Left PERRL 2.5 2 Round Minimal None         Visual Fields     Counting fingers         Extraocular Movement       Right Left    Full Full         Neuro/Psych     Oriented x3: Yes   Mood/Affect: Normal         Dilation     Both eyes: 1.0% Mydriacyl, 2.5% Phenylephrine @ 2:41 PM           Slit Lamp and Fundus Exam     External Exam       Right Left   External Normal Normal         Slit Lamp Exam       Right Left  Lids/Lashes Normal Normal   Conjunctiva/Sclera White and quiet White and quiet   Cornea Clear Clear   Anterior Chamber Deep and quiet Deep and quiet   Iris Round and reactive Round and reactive   Lens Clear Clear   Anterior Vitreous Normal Normal         Fundus Exam       Right Left   Posterior Vitreous Normal Normal   Disc Normal Normal   C/D Ratio 0.75 0.75   Macula Early age related macular degeneration, Retinal pigment epithelial mottling Early age related macular degeneration   Vessels Normal Normal   Periphery Normal, no holes or tears Normal, no holes or tears            IMAGING AND PROCEDURES  Imaging and Procedures for 12/23/20  OCT, Retina - OU - Both Eyes       Right Eye Quality was good. Scan locations included subfoveal. Central Foveal Thickness: 224. Progression has  no prior data. Findings include normal foveal contour.   Left Eye Quality was good. Scan locations included subfoveal. Central Foveal Thickness: 243. Progression has no prior data. Findings include normal foveal contour.   Notes Mild diffuse retinal atrophy throughout the posterior pole OU may be really related to enlarged cup-to-disc ratio, borderline glaucoma.  Thickening of Bruch's membrane and ARMD drusenoid deposits OU but no sign of CNVM OU     Color Fundus Photography Optos - OU - Both Eyes       Right Eye Progression has no prior data. Disc findings include increased cup to disc ratio. Macula : retinal pigment epithelium abnormalities, drusen. Vessels : normal observations. Periphery : normal observations.   Left Eye Progression has no prior data. Disc findings include increased cup to disc ratio. Macula : retinal pigment epithelium abnormalities, drusen. Vessels : normal observations. Periphery : normal observations.   Notes Clear media, no retinal detachment OU             ASSESSMENT/PLAN:  Obstructive sleep apnea I reviewed this finding from 9 years previous with the patient.  I do not know of any updates however I have encouraged the patient to have evaluation of sleep apnea if he has any thought that he could be trouble with sleep apnea at night  COPD GOLD III with reversibility This condition with deprivation of oxygen may have some diffuse effect upon the patient's macular perfusion long-term  Intermediate stage nonexudative age-related macular degeneration of both eyes The nature of age--related macular degeneration was discussed with the patient as well as the distinction between dry and wet types. Checking an Amsler Grid daily with advice to return immediately should a distortion develop, was given to the patient. The patient 's smoking status now and in the past was determined and advice based on the AREDS study was provided regarding the consumption of  antioxidant supplements. AREDS 2 vitamin formulation was recommended. Consumption of dark leafy vegetables and fresh fruits of various colors was recommended. Treatment modalities for wet macular degeneration particularly the use of intravitreal injections of anti-blood vessel growth factors was discussed with the patient. Avastin, Lucentis, and Eylea are the available options. On occasion, therapy includes the use of photodynamic therapy and thermal laser. Stressed to the patient do not rub eyes.  Patient was advised to check Amsler Grid daily and return immediately if changes are noted. Instructions on using the grid were given to the patient. All patient questions were answered.     ICD-10-CM   1.  Intermediate stage nonexudative age-related macular degeneration of both eyes  H35.3132 OCT, Retina - OU - Both Eyes    Color Fundus Photography Optos - OU - Both Eyes    2. Obstructive sleep apnea  G47.33 Color Fundus Photography Optos - OU - Both Eyes    3. COPD GOLD III with reversibility  J44.9       1.  OU with intermediate ARMD.  Patient has nighttime vision adaptation problems.  This is most likely due to diffuse macular atrophy exacerbated by his chronic hypoxic condition from COPD requiring full-time oxygen therapy notably also has a history of previous OSA requiring CPAP however patient does not use CPAP at night  2.  No other specific therapy other than vitamin supplementation, AREDS 2 recommended  3.  No signs of retinal holes or tears in either eye.  4.  Follow-up with Dr. Eston Mould as scheduled  Ophthalmic Meds Ordered this visit:  No orders of the defined types were placed in this encounter.      Return in about 6 months (around 06/23/2021) for DILATE OU, COLOR FP, OCT.  There are no Patient Instructions on file for this visit.   Explained the diagnoses, plan, and follow up with the patient and they expressed understanding.  Patient expressed understanding of the  importance of proper follow up care.   Clent Demark Lee Kalt M.D. Diseases & Surgery of the Retina and Vitreous [facility] 12/23/20     Abbreviations: M myopia (nearsighted); A astigmatism; H hyperopia (farsighted); P presbyopia; Mrx spectacle prescription;  CTL contact lenses; OD right eye; OS left eye; OU both eyes  XT exotropia; ET esotropia; PEK punctate epithelial keratitis; PEE punctate epithelial erosions; DES dry eye syndrome; MGD meibomian gland dysfunction; ATs artificial tears; PFAT's preservative free artificial tears; [place] nuclear sclerotic cataract; PSC posterior subcapsular cataract; ERM epi-retinal membrane; PVD posterior vitreous detachment; RD retinal detachment; DM diabetes mellitus; DR diabetic retinopathy; NPDR non-proliferative diabetic retinopathy; PDR proliferative diabetic retinopathy; CSME clinically significant macular edema; DME diabetic macular edema; dbh dot blot hemorrhages; CWS cotton wool spot; POAG primary open angle glaucoma; C/D cup-to-disc ratio; HVF humphrey visual field; GVF goldmann visual field; OCT optical coherence tomography; IOP intraocular pressure; BRVO Branch retinal vein occlusion; CRVO central retinal vein occlusion; CRAO central retinal artery occlusion; BRAO branch retinal artery occlusion; RT retinal tear; SB scleral buckle; PPV pars plana vitrectomy; VH Vitreous hemorrhage; PRP panretinal laser photocoagulation; IVK intravitreal kenalog; VMT vitreomacular traction; MH Macular hole;  NVD neovascularization of the disc; NVE neovascularization elsewhere; AREDS age related eye disease study; ARMD age related macular degeneration; POAG primary open angle glaucoma; EBMD epithelial/anterior basement membrane dystrophy; ACIOL anterior chamber intraocular lens; IOL intraocular lens; PCIOL posterior chamber intraocular lens; Phaco/IOL phacoemulsification with intraocular lens placement; [place] photorefractive keratectomy; LASIK laser assisted in situ  keratomileusis; HTN hypertension; DM diabetes mellitus; COPD chronic obstructive pulmonary disease

## 2020-12-23 NOTE — Assessment & Plan Note (Signed)

## 2020-12-23 NOTE — Assessment & Plan Note (Signed)
I reviewed this finding from 9 years previous with the patient.  I do not know of any updates however I have encouraged the patient to have evaluation of sleep apnea if he has any thought that he could be trouble with sleep apnea at night

## 2020-12-29 ENCOUNTER — Telehealth: Payer: Self-pay | Admitting: *Deleted

## 2020-12-29 NOTE — Telephone Encounter (Signed)
CALLED PATIENT TO INFORM OF CT FOR 01-15-21- ARRIVAL TIME- 11:15 AM @ [facility], NO RESTRICTIONS TO TEST, PATIENT TO FU WITH DR. KINARD ON 01-19-21 @ 11 AM FOR RESULTS, SPOKE WITH PATIENT AND HE IS AWARE OF THESE APPTS.

## 2021-01-15 ENCOUNTER — Ambulatory Visit (HOSPITAL_COMMUNITY)
Admission: RE | Admit: 2021-01-15 | Discharge: 2021-01-15 | Disposition: A | Discharge status: Home / Self Care | Payer: Medicare Other | Source: Ambulatory Visit | Attending: Radiation Oncology | Admitting: Radiation Oncology

## 2021-01-15 ENCOUNTER — Other Ambulatory Visit: Payer: Self-pay

## 2021-01-15 DIAGNOSIS — I700 Atherosclerosis of aorta: Secondary | ICD-10-CM | POA: Diagnosis not present

## 2021-01-15 DIAGNOSIS — C3492 Malignant neoplasm of unspecified part of left bronchus or lung: Secondary | ICD-10-CM | POA: Insufficient documentation

## 2021-01-15 DIAGNOSIS — C3490 Malignant neoplasm of unspecified part of unspecified bronchus or lung: Secondary | ICD-10-CM | POA: Diagnosis not present

## 2021-01-15 DIAGNOSIS — R911 Solitary pulmonary nodule: Secondary | ICD-10-CM | POA: Diagnosis not present

## 2021-01-15 DIAGNOSIS — J439 Emphysema, unspecified: Secondary | ICD-10-CM | POA: Diagnosis not present

## 2021-01-18 NOTE — Progress Notes (Signed)
Radiation Oncology         [phone] ________________________________  Name: [name] MRN: [id number]  Date: 01/19/2021  DOB: 1935-10-27  Follow-Up Visit Note  CC: Wenda Low, MD  Wenda Low, MD    ICD-10-CM   1. Non-small cell carcinoma of left lung, stage 1 ([place])  C34.92     2. Lung mass  R91.8     3. Recurrent cancer of left lung of unknown cell type (HCC)  C34.92 CT CHEST WO CONTRAST      Diagnosis: Recurrent non-small cell lung cancer  Interval Since Last Radiation: 1 year, 9 months, and 25 days   Radiation Treatment Dates: 02/13/2019 through 03/28/2019 Site Technique Total Dose (Gy) Dose per Fx (Gy) Completed Fx Beam Energies  Lung, Left: Lung_Lt IMRT 60/60 2 30/30 6X    Narrative:  The patient returns today for routine follow-up, he was last seen here for follow up on 07/14/20. Most recent chest CT scan on 01/15/21 showed unchanged post-treatment/post-radiation appearance of the anterior subpleural left apex, with dense radiation fibrosis and consolidation containing a fiducial marker. Underlying nodule was noted as not clearly visible. The subpleural nodule of the dependent right lower lobe was also seen without significant change. Incidental findings included enlargement of the main pulmonary artery, as typically seen in pulmonary hypertension.   In the interval, the patient also followed up with his cardiologist, Dr. Einar Gip, on 09/29/20. During which time, the patient reported continued mild chronic dyspnea.  Otherwise, the patient denied any cardiovascular symptoms of concern. EKG performed during this encounter showed normal sinus rhythm at rate of 75 bpm, LAA, leftward axis, and incomplete right bundle branch block. No significant change was noted from prior EKG on 10/04/2019.      He denies any pain within the chest area significant cough or hemoptysis.  He continues on supplemental oxygen at 2 L.                         Allergies:  is allergic to  entresto [sacubitril-valsartan], iodinated diagnostic agents, penicillins, sulfonamide derivatives, lumigan [bimatoprost], lyrica [pregabalin], symbicort [budesonide-formoterol fumarate], valsartan, advair diskus [fluticasone-salmeterol], azithromycin, brimonidine tartrate, ciprofloxacin, doxycycline, flovent diskus [fluticasone furoate], pulmicort [budesonide], qvar [beclomethasone], spiriva [tiotropium bromide monohydrate], tessalon [benzonatate], bacitracin, flonase [fluticasone propionate], relafen [nabumetone], and tape.  Meds: Current Outpatient Medications  Medication Sig Dispense Refill   albuterol (VENTOLIN HFA) 108 (90 Base) MCG/ACT inhaler Inhale 2 puffs into the lungs every 6 (six) hours as needed for wheezing or shortness of breath.      gabapentin (NEURONTIN) 300 MG capsule Take 600 mg by mouth 3 (three) times daily.      latanoprost (XALATAN) 0.005 % ophthalmic solution Place 1 drop into both eyes at bedtime.      Maltodextrin-Xanthan Gum (RESOURCE THICKENUP CLEAR) POWD Take by mouth as needed.     metoprolol succinate (TOPROL-XL) 25 MG 24 hr tablet Take 0.5 tablets (12.5 mg total) by mouth every morning. 60 tablet 0   nitroGLYCERIN (NITROSTAT) 0.4 MG SL tablet Place 0.4 mg under the tongue every 5 (five) minutes as needed for chest pain. For chest pain      OXYGEN Inhale 2 L into the lungs at bedtime.      tamsulosin (FLOMAX) 0.4 MG CAPS capsule Take 0.4 mg by mouth every evening.      timolol (TIMOPTIC) 0.5 % ophthalmic solution Place 1 drop into both eyes 2 (two) times daily.  aspirin 81 MG tablet Take 81 mg by mouth daily.  (Patient not taking: Reported on 01/19/2021)     rosuvastatin (CRESTOR) 5 MG tablet Take 1 tablet (5 mg total) by mouth daily. (Patient not taking: Reported on 01/19/2021) 90 tablet 3   No current facility-administered medications for this encounter.    Physical Findings: The patient is in no acute distress. Patient is alert and oriented.  Supplemental  oxygen in place by nasal cannula.  height is 5\' 11"  (1.803 m) and weight is 176 lb 6 oz (80 kg). His temporal temperature is 96.9 F (36.1 C) (abnormal). His blood pressure is 125/84 and his pulse is 65. His respiration is 18 and oxygen saturation is 96%. .  No significant changes. Lungs are clear to auscultation bilaterally. Heart has regular rate and rhythm. No palpable cervical, supraclavicular, or axillary adenopathy. Abdomen soft, non-tender, normal bowel sounds.    Lab Findings: Lab Results  Component Value Date   WBC 11.6 (H) 09/17/2019   HGB 11.3 (L) 09/17/2019   HCT 36.1 (L) 09/17/2019   MCV 92.6 09/17/2019   PLT 181 09/17/2019    Radiographic Findings: CT CHEST WO CONTRAST  Result Date: 01/16/2021 CLINICAL DATA:  Non-small cell lung cancer, assess treatment response EXAM: CT CHEST WITHOUT CONTRAST TECHNIQUE: Multidetector CT imaging of the chest was performed following the standard protocol without IV contrast. COMPARISON:  07/11/2020 FINDINGS: Cardiovascular: Aortic atherosclerosis. Normal heart size. Extensive Three-vessel coronary artery calcifications and/or stents. No pericardial effusion. Enlargement of the main pulmonary artery, measuring up to 3.5 cm in caliber. Mediastinum/Nodes: No enlarged mediastinal, hilar, or axillary lymph nodes. Thyroid gland, trachea, and esophagus demonstrate no significant findings. Lungs/Pleura: Moderate centrilobular emphysema. Unchanged post treatment/post radiation appearance of the anterior subpleural left apex, with dense radiation fibrosis and consolidation containing a fiducial marker and measuring 3.0 x 1.8 cm (series 5, image 23). Subpleural nodule of the dependent right lower lobe is not significantly changed, measuring 1.6 x 0.8 cm (series 5, image 112). Unchanged elevation of the left hemidiaphragm with associated scarring or atelectasis. No pleural effusion or pneumothorax. Upper Abdomen: No acute abnormality.  Simple bilateral renal  cysts. Musculoskeletal: No chest wall mass or suspicious bone lesions identified. IMPRESSION: 1. Unchanged post treatment/post radiation appearance of the anterior subpleural left apex, with dense radiation fibrosis and consolidation containing a fiducial marker. Underlying nodule is not clearly visible. 2. Subpleural nodule of the dependent right lower lobe is not significantly changed. Continued attention on follow-up. 3. Emphysema. 4. Coronary artery disease. 5. Enlargement of the main pulmonary artery, as can be seen in pulmonary hypertension. Aortic Atherosclerosis (ICD10-I70.0) and Emphysema (ICD10-J43.9). Electronically Signed   By: Delanna Ahmadi M.D.   On: 01/16/2021 08:52   OCT, Retina - OU - Both Eyes  Result Date: 12/23/2020 Right Eye Quality was good. Scan locations included subfoveal. Central Foveal Thickness: 224. Progression has no prior data. Findings include normal foveal contour. Left Eye Quality was good. Scan locations included subfoveal. Central Foveal Thickness: 243. Progression has no prior data. Findings include normal foveal contour. Notes Mild diffuse retinal atrophy throughout the posterior pole OU may be really related to enlarged cup-to-disc ratio, borderline glaucoma.  Thickening of Bruch's membrane and ARMD drusenoid deposits OU but no sign of CNVM OU  Color Fundus Photography Optos - OU - Both Eyes  Result Date: 12/23/2020 Right Eye Progression has no prior data. Disc findings include increased cup to disc ratio. Macula : retinal pigment epithelium abnormalities, drusen. Vessels : normal  observations. Periphery : normal observations. Left Eye Progression has no prior data. Disc findings include increased cup to disc ratio. Macula : retinal pigment epithelium abnormalities, drusen. Vessels : normal observations. Periphery : normal observations. Notes Clear media, no retinal detachment OU   Impression:  Recurrent non-small cell lung cancer  No evidence of recurrence on  clinical exam today.  Recent chest CT scan also very encouraging without evidence of recurrence  Plan: Routine follow-up in 6 months.  Prior to this follow-up appointment patient will undergo repeat chest CT scan.   20 minutes of total time was spent for this patient encounter, including preparation, face-to-face counseling with the patient and coordination of care, physical exam, and documentation of the encounter. ____________________________________  Blair Promise, PhD, MD   This document serves as a record of services personally performed by Gery Pray, MD. It was created on his behalf by Roney Mans, a trained medical scribe. The creation of this record is based on the scribe's personal observations and the provider's statements to them. This document has been checked and approved by the attending provider.

## 2021-01-19 ENCOUNTER — Other Ambulatory Visit: Payer: Self-pay

## 2021-01-19 ENCOUNTER — Encounter: Payer: Self-pay | Admitting: Radiation Oncology

## 2021-01-19 ENCOUNTER — Ambulatory Visit
Admission: RE | Admit: 2021-01-19 | Discharge: 2021-01-19 | Disposition: A | Discharge status: Home / Self Care | Payer: Medicare Other | Source: Ambulatory Visit | Attending: Radiation Oncology | Admitting: Radiation Oncology

## 2021-01-19 VITALS — BP 125/84 | HR 65 | Temp 96.9°F | Resp 18 | Ht 71.0 in | Wt 176.4 lb

## 2021-01-19 DIAGNOSIS — C3492 Malignant neoplasm of unspecified part of left bronchus or lung: Secondary | ICD-10-CM

## 2021-01-19 DIAGNOSIS — Z923 Personal history of irradiation: Secondary | ICD-10-CM | POA: Insufficient documentation

## 2021-01-19 DIAGNOSIS — I2510 Atherosclerotic heart disease of native coronary artery without angina pectoris: Secondary | ICD-10-CM | POA: Diagnosis not present

## 2021-01-19 DIAGNOSIS — Z9981 Dependence on supplemental oxygen: Secondary | ICD-10-CM | POA: Insufficient documentation

## 2021-01-19 DIAGNOSIS — J439 Emphysema, unspecified: Secondary | ICD-10-CM | POA: Diagnosis not present

## 2021-01-19 DIAGNOSIS — Z79899 Other long term (current) drug therapy: Secondary | ICD-10-CM | POA: Insufficient documentation

## 2021-01-19 DIAGNOSIS — N281 Cyst of kidney, acquired: Secondary | ICD-10-CM | POA: Diagnosis not present

## 2021-01-19 DIAGNOSIS — Z85118 Personal history of other malignant neoplasm of bronchus and lung: Secondary | ICD-10-CM | POA: Insufficient documentation

## 2021-01-19 DIAGNOSIS — Z08 Encounter for follow-up examination after completed treatment for malignant neoplasm: Secondary | ICD-10-CM | POA: Diagnosis not present

## 2021-01-19 DIAGNOSIS — Z7982 Long term (current) use of aspirin: Secondary | ICD-10-CM | POA: Insufficient documentation

## 2021-01-19 DIAGNOSIS — I700 Atherosclerosis of aorta: Secondary | ICD-10-CM | POA: Diagnosis not present

## 2021-01-19 DIAGNOSIS — R918 Other nonspecific abnormal finding of lung field: Secondary | ICD-10-CM

## 2021-01-19 NOTE — Progress Notes (Signed)
[name] is here today for follow up post radiation to the lung.  Lung Side: Left, patient  completed treatment on 03/18/19  Does the patient complain of any of the following: Pain: Patient reports having discomfort to chest when taking a deep breaths.  Shortness of breath w/wo exertion: yes, on exertion. Patient currently on oxygen 2 L via [place].  Cough: Patient reports having dry cough at times.  Hemoptysis: No Pain with swallowing: No Swallowing/choking concerns:no Appetite: Good Energy Level:  Reports having mild fatigue.  Post radiation skin Changes: skin is intact.     Additional comments if applicable:   Vitals:   01/19/21 1100  BP: 125/84  Pulse: 65  Resp: 18  Temp: (!) 96.9 F (36.1 C)  TempSrc: Temporal  SpO2: 96%  Weight: 176 lb 6 oz (80 kg)  Height: 5\' 11"  (1.803 m)

## 2021-01-21 ENCOUNTER — Encounter (HOSPITAL_COMMUNITY): Payer: Self-pay | Admitting: Emergency Medicine

## 2021-01-21 ENCOUNTER — Emergency Department (HOSPITAL_COMMUNITY)
Admission: EM | Admit: 2021-01-21 | Discharge: 2021-01-21 | Disposition: A | Discharge status: Home / Self Care | Payer: Medicare Other | Attending: Emergency Medicine | Admitting: Emergency Medicine

## 2021-01-21 ENCOUNTER — Other Ambulatory Visit: Payer: Self-pay

## 2021-01-21 ENCOUNTER — Emergency Department (HOSPITAL_COMMUNITY): Payer: Medicare Other

## 2021-01-21 DIAGNOSIS — I10 Essential (primary) hypertension: Secondary | ICD-10-CM | POA: Diagnosis not present

## 2021-01-21 DIAGNOSIS — S32512A Fracture of superior rim of left pubis, initial encounter for closed fracture: Secondary | ICD-10-CM | POA: Diagnosis not present

## 2021-01-21 DIAGNOSIS — S329XXA Fracture of unspecified parts of lumbosacral spine and pelvis, initial encounter for closed fracture: Secondary | ICD-10-CM | POA: Diagnosis not present

## 2021-01-21 DIAGNOSIS — J449 Chronic obstructive pulmonary disease, unspecified: Secondary | ICD-10-CM | POA: Diagnosis not present

## 2021-01-21 DIAGNOSIS — W010XXA Fall on same level from slipping, tripping and stumbling without subsequent striking against object, initial encounter: Secondary | ICD-10-CM | POA: Diagnosis not present

## 2021-01-21 DIAGNOSIS — Z87891 Personal history of nicotine dependence: Secondary | ICD-10-CM | POA: Insufficient documentation

## 2021-01-21 DIAGNOSIS — Z7982 Long term (current) use of aspirin: Secondary | ICD-10-CM | POA: Insufficient documentation

## 2021-01-21 DIAGNOSIS — S32592A Other specified fracture of left pubis, initial encounter for closed fracture: Secondary | ICD-10-CM | POA: Diagnosis not present

## 2021-01-21 DIAGNOSIS — E789 Disorder of lipoprotein metabolism, unspecified: Secondary | ICD-10-CM | POA: Insufficient documentation

## 2021-01-21 DIAGNOSIS — W1789XA Other fall from one level to another, initial encounter: Secondary | ICD-10-CM | POA: Diagnosis not present

## 2021-01-21 DIAGNOSIS — W19XXXA Unspecified fall, initial encounter: Secondary | ICD-10-CM

## 2021-01-21 DIAGNOSIS — Z981 Arthrodesis status: Secondary | ICD-10-CM | POA: Diagnosis not present

## 2021-01-21 DIAGNOSIS — M25552 Pain in left hip: Secondary | ICD-10-CM | POA: Diagnosis not present

## 2021-01-21 DIAGNOSIS — S32502A Unspecified fracture of left pubis, initial encounter for closed fracture: Secondary | ICD-10-CM | POA: Diagnosis not present

## 2021-01-21 LAB — CBC WITH DIFFERENTIAL/PLATELET
Abs Immature Granulocytes: 0 10*3/uL (ref 0.00–0.07)
Basophils Absolute: 0.2 10*3/uL — ABNORMAL HIGH (ref 0.0–0.1)
Basophils Relative: 1 %
Eosinophils Absolute: 0.2 10*3/uL (ref 0.0–0.5)
Eosinophils Relative: 1 %
HCT: 40.3 % (ref 39.0–52.0)
Hemoglobin: 13.5 g/dL (ref 13.0–17.0)
Lymphocytes Relative: 50 %
Lymphs Abs: 8.9 10*3/uL — ABNORMAL HIGH (ref 0.7–4.0)
MCH: 32.1 pg (ref 26.0–34.0)
MCHC: 33.5 g/dL (ref 30.0–36.0)
MCV: 96 fL (ref 80.0–100.0)
Monocytes Absolute: 0.7 10*3/uL (ref 0.1–1.0)
Monocytes Relative: 4 %
Neutro Abs: 7.8 10*3/uL — ABNORMAL HIGH (ref 1.7–7.7)
Neutrophils Relative %: 44 %
Platelets: 121 10*3/uL — ABNORMAL LOW (ref 150–400)
RBC: 4.2 MIL/uL — ABNORMAL LOW (ref 4.22–5.81)
RDW: 12.9 % (ref 11.5–15.5)
WBC: 17.7 10*3/uL — ABNORMAL HIGH (ref 4.0–10.5)
nRBC: 0 % (ref 0.0–0.2)
nRBC: 0 /100 WBC

## 2021-01-21 LAB — URINALYSIS, ROUTINE W REFLEX MICROSCOPIC
Bilirubin Urine: NEGATIVE
Glucose, UA: NEGATIVE mg/dL
Hgb urine dipstick: NEGATIVE
Ketones, ur: NEGATIVE mg/dL
Leukocytes,Ua: NEGATIVE
Nitrite: NEGATIVE
Protein, ur: NEGATIVE mg/dL
Specific Gravity, Urine: 1.008 (ref 1.005–1.030)
pH: 6 (ref 5.0–8.0)

## 2021-01-21 LAB — COMPREHENSIVE METABOLIC PANEL
ALT: 11 U/L (ref 0–44)
AST: 16 U/L (ref 15–41)
Albumin: 3.5 g/dL (ref 3.5–5.0)
Alkaline Phosphatase: 53 U/L (ref 38–126)
Anion gap: 9 (ref 5–15)
BUN: 16 mg/dL (ref 8–23)
CO2: 30 mmol/L (ref 22–32)
Calcium: 8.7 mg/dL — ABNORMAL LOW (ref 8.9–10.3)
Chloride: 96 mmol/L — ABNORMAL LOW (ref 98–111)
Creatinine, Ser: 0.83 mg/dL (ref 0.61–1.24)
GFR, Estimated: >60 mL/min (ref >60)
Glucose, Bld: 123 mg/dL — ABNORMAL HIGH (ref 70–99)
Potassium: 4.1 mmol/L (ref 3.5–5.1)
Sodium: 135 mmol/L (ref 135–145)
Total Bilirubin: 1.5 mg/dL — ABNORMAL HIGH (ref 0.3–1.2)
Total Protein: 6.5 g/dL (ref 6.5–8.1)

## 2021-01-21 NOTE — Consult Note (Addendum)
Reason for Consult:Left sup/inf pelvic fxs Referring Physician: Delphina Cahill Time called: 1962 Time at bedside: [name] is an 85 y.o. male.  HPI: Masur fell Monday afternoon over a parking curb. He hurt his left hip but was able to be helped up and was taken home. He's been managing at home with a RW but continued to hurt so came to the ED for evaluation today. X-rays showed left sup/inf pubic rami fxs and orthopedic surgery was consulted. He lives at home with his wife and doesn't use any assistive devices at baseline.  Past Medical History:  Diagnosis Date   AAA (abdominal aortic aneurysm)    measures 2.7 cm.   Arthritis    Back pain    BPH (benign prostatic hypertrophy)    Bruises easily    CAD (coronary artery disease)    Cervical spine fracture ([place]) feb 1990   C 6, C 7 and T 1, no surgery done wore halo brace   CLL (chronic lymphocytic leukemia) ([place])    hx of worked up 2009 by dr Benay Spice, no tx done, released by dr sherrill   Complication of anesthesia    cannot lie on right side gets vertigo and trouble turning neck to right   COPD (chronic obstructive pulmonary disease) ([place]) may 2005   Erectile dysfunction    Fatigue    GERD (gastroesophageal reflux disease)    Glaucoma    Hepatitis as child   serum hepatitis   History of oxygen administration    oxygen  @2   l/m nasally at bedtime.   History of radiation therapy 06/22/16, 06/24/2016, 06/28/16   Left upper lobe of lung/ 54 Gy in 3 fractions   History of radiation therapy 02/13/2019-03/28/2019   IMRT to left lung    Dr Gery Pray   Hyperlipidemia    Hypertension    Impaired hearing    wears Hearing aids   Ischemic cardiomyopathy    Leg pain    Lumbar disc disease    Lung cancer ([place]) dx'd 03/18/16   Microscopic hematuria    Myocardial infarct ([place]) 03-17-2009   Hx MI 2000   Neuropathy    both feet and legs   Osteopenia    Pneumonia    hx x2   Thyroid nodule     Past Surgical History:   Procedure Laterality Date   APPENDECTOMY  sept, 10, 2005   BACK SURGERY  2009   L 2 and L 3    benign cyst removed from left neck  02-2010   [place]  2005-2008   C 2, C 3 and C 4 2005, rods placed C 6, C 7 and T 1   COLONOSCOPY WITH PROPOFOL N/A 11/26/2014   Procedure: COLONOSCOPY WITH PROPOFOL;  Surgeon: Garlan Fair, MD;  Location: [facility];  Service: Endoscopy;  Laterality: N/A;   CORONARY ANGIOPLASTY WITH STENT PLACEMENT  2013   1 stent repair, one stent replaced   ESOPHAGOGASTRODUODENOSCOPY (EGD) WITH PROPOFOL N/A 11/27/2013   Procedure: ESOPHAGOGASTRODUODENOSCOPY (EGD) WITH PROPOFOL;  Surgeon: Garlan Fair, MD;  Location: [facility];  Service: Endoscopy;  Laterality: N/A;   EYE SURGERY Bilateral july 2011   eye surgry for glaucoma   FUDUCIAL PLACEMENT N/A 05/12/2016   Procedure: PLACEMENT OF FUDUCIAL;  Surgeon: Melrose Nakayama, MD;  Location: [facility];  Service: Thoracic;  Laterality: N/A;   fusion L 3 and L 4  August 15, 2008   LEFT HEART CATHETERIZATION WITH  CORONARY ANGIOGRAM N/A 08/12/2011   Procedure: LEFT HEART CATHETERIZATION WITH CORONARY ANGIOGRAM;  Surgeon: Larey Dresser, MD;  Location: [facility] C[facility] LAB;  Service: Cardiovascular;  Laterality: N/A;   NASAL SINUS SURGERY   oct 2005   right knee arthroscopy  1981   stent top heart  2011   x 2   TONSILLECTOMY  1943   VIDEO BRONCHOSCOPY WITH ENDOBRONCHIAL NAVIGATION N/A 05/12/2016   Procedure: VIDEO BRONCHOSCOPY WITH ENDOBRONCHIAL NAVIGATION;  Surgeon: Melrose Nakayama, MD;  Location: [facility];  Service: Thoracic;  Laterality: N/A;   VIDEO BRONCHOSCOPY WITH ENDOBRONCHIAL NAVIGATION N/A 08/30/2019   Procedure: VIDEO BRONCHOSCOPY WITH ENDOBRONCHIAL NAVIGATION;  Surgeon: Melrose Nakayama, MD;  Location: [facility];  Service: Thoracic;  Laterality: N/A;   VIDEO BRONCHOSCOPY WITH ENDOBRONCHIAL ULTRASOUND N/A 08/30/2019   Procedure: VIDEO BRONCHOSCOPY WITH ENDOBRONCHIAL ULTRASOUND;  Surgeon: Melrose Nakayama, MD;  Location: [facility];  Service: Thoracic;  Laterality: N/A;    Family History  Problem Relation Age of Onset   Alzheimer's disease Mother        died of natural causes   Lung cancer Father 80       died.    Cancer Brother        jaw   Renal cancer Brother    Lung cancer Brother     Social History:  reports that he quit smoking about 14 years ago. His smoking use included cigarettes. He has a 110.00 pack-year smoking history. He has never used smokeless tobacco. He reports that he does not drink alcohol and does not use drugs.  Allergies:  Allergies  Allergen Reactions   Entresto [Sacubitril-Valsartan] Diarrhea    Patient had diarrhea, hyptotensive,dizziness,headache and lethargic   Iodinated Diagnostic Agents Swelling and Other (See Comments)    Patient experienced tongue tingling, followed by swelling under tongue and in throat area, also patient had eye redness/irriation - Isovve Nonionic Contrast 75U   Penicillins Swelling    SWELLING OF TONGUE   Sulfonamide Derivatives Swelling    SWELLING HANDS   Lumigan [Bimatoprost] Swelling    EYES SWELL   Lyrica [Pregabalin] Other (See Comments)    TREMORS CONFUSION   Symbicort [Budesonide-Formoterol Fumarate] Itching and Swelling   Valsartan Other (See Comments)    BP drop   Advair Diskus [Fluticasone-Salmeterol] Itching   Azithromycin Itching    Itching from head to toe   Brimonidine Tartrate     unknown   Ciprofloxacin     Patient does not want to have this anymore(does not want any forms of this) patient reports having C Diff after taking this   Doxycycline Diarrhea   Flovent Diskus [Fluticasone Furoate] Itching   Pulmicort [Budesonide] Itching and Swelling   Qvar [Beclomethasone] Itching and Swelling   Spiriva [Tiotropium Bromide Monohydrate] Itching and Swelling   Tessalon [Benzonatate] Other (See Comments)    Dizzy/skin reaction   Bacitracin Itching, Swelling and Anxiety   Flonase [Fluticasone Propionate] Other  (See Comments)    NOSE BLEED   Relafen [Nabumetone] Rash    REACTION: Reaction not known to relafen   Tape Itching, Rash and Other (See Comments)    Skin tear, please use paper tape    Medications: I have reviewed the patient's current medications.  Results for orders placed or performed during the hospital encounter of 01/21/21 (from the past 48 hour(s))  Comprehensive metabolic panel     Status: Abnormal   Collection Time: 01/21/21 12:43 PM  Result Value Ref Range   Sodium  135 135 - 145 mmol/L   Potassium 4.1 3.5 - 5.1 mmol/L   Chloride 96 (L) 98 - 111 mmol/L   CO2 30 22 - 32 mmol/L   Glucose, Bld 123 (H) 70 - 99 mg/dL    Comment: Glucose reference range applies only to samples taken after fasting for at least 8 hours.   BUN 16 8 - 23 mg/dL   Creatinine, Ser 0.83 0.61 - 1.24 mg/dL   Calcium 8.7 (L) 8.9 - 10.3 mg/dL   Total Protein 6.5 6.5 - 8.1 g/dL   Albumin 3.5 3.5 - 5.0 g/dL   AST 16 15 - 41 U/L   ALT 11 0 - 44 U/L   Alkaline Phosphatase 53 38 - 126 U/L   Total Bilirubin 1.5 (H) 0.3 - 1.2 mg/dL   GFR, Estimated >60 >60 mL/min    Comment: (NOTE) Calculated using the CKD-EPI Creatinine Equation (2021)    Anion gap 9 5 - 15    Comment: Performed at [facility] [address] [place], [place] [address]  CBC with Differential     Status: Abnormal   Collection Time: 01/21/21 12:43 PM  Result Value Ref Range   WBC 17.7 (H) 4.0 - 10.5 K/uL   RBC 4.20 (L) 4.22 - 5.81 MIL/uL   Hemoglobin 13.5 13.0 - 17.0 g/dL   HCT 40.3 39.0 - 52.0 %   MCV 96.0 80.0 - 100.0 fL   MCH 32.1 26.0 - 34.0 pg   MCHC 33.5 30.0 - 36.0 g/dL   RDW 12.9 11.5 - 15.5 %   Platelets 121 (L) 150 - 400 K/uL   nRBC 0.0 0.0 - 0.2 %   Neutrophils Relative % 44 %   Neutro Abs 7.8 (H) 1.7 - 7.7 K/uL   Lymphocytes Relative 50 %   Lymphs Abs 8.9 (H) 0.7 - 4.0 K/uL   Monocytes Relative 4 %   Monocytes Absolute 0.7 0.1 - 1.0 K/uL   Eosinophils Relative 1 %   Eosinophils Absolute 0.2 0.0 - 0.5 K/uL    Basophils Relative 1 %   Basophils Absolute 0.2 (H) 0.0 - 0.1 K/uL   nRBC 0 0 /100 WBC   Abs Immature Granulocytes 0.00 0.00 - 0.07 K/uL    Comment: Performed at [facility], [place] [address] [place], [place] [address]    CT PELVIS WO CONTRAST  Result Date: 01/21/2021 CLINICAL DATA:  Left hip pain after fall yesterday. EXAM: CT PELVIS WITHOUT CONTRAST TECHNIQUE: Multidetector CT imaging of the pelvis was performed following the standard protocol without intravenous contrast. COMPARISON:  Radiograph same day. FINDINGS: Urinary Tract:  No abnormality visualized. Bowel:  Unremarkable visualized pelvic bowel loops. Vascular/Lymphatic: Atherosclerosis of visualized abdominal aorta. Probable infrarenal abdominal aortic aneurysm. No significant adenopathy is noted. Reproductive:  Prostate is unremarkable. Other: Moderate pelvic hematoma is noted anteriorly and inferior to urinary bladder. Musculoskeletal: Mildly displaced and possibly comminuted fracture is seen involving the left parasymphyseal region extending from junction with left superior pubic ramus to the left inferior pubic ramus. IMPRESSION: Mildly displaced and possibly comminuted fracture is seen involving the left parasymphyseal region of the pubic bone extending from junction with left superior pubic ramus to the left inferior pubic ramus. Moderate pelvic hematoma is noted anteriorly and inferior to the urinary bladder. Probable infrarenal abdominal aortic aneurysm which is incompletely visualized. Further evaluation with ultrasound or CT scan of the abdomen is recommended. Aortic Atherosclerosis (ICD10-I70.0). Electronically Signed   By: Bobbe Medico.D.  On: 01/21/2021 12:35   DG Hip Unilat With Pelvis 2-3 Views Left  Result Date: 01/21/2021 CLINICAL DATA:  Left hip and groin pain since fall yesterday. EXAM: DG HIP (WITH OR WITHOUT PELVIS) 2-3V LEFT COMPARISON:  None. FINDINGS: Acute nondisplaced fracture of the left parasymphyseal  pubic bone. No additional fracture. No dislocation. Joint spaces are preserved. Osteopenia. Prior lumbar fusion. Soft tissues are unremarkable. IMPRESSION: 1. Acute nondisplaced left parasymphyseal pubic bone fracture. Electronically Signed   By: Titus Dubin M.D.   On: 01/21/2021 10:16    Review of Systems  HENT:  Negative for ear discharge, ear pain, hearing loss and tinnitus.   Eyes:  Negative for photophobia and pain.  Respiratory:  Negative for cough and shortness of breath.   Cardiovascular:  Negative for chest pain.  Gastrointestinal:  Negative for abdominal pain, nausea and vomiting.  Genitourinary:  Negative for dysuria, flank pain, frequency and urgency.  Musculoskeletal:  Positive for arthralgias (Left hip/pelvis). Negative for back pain, myalgias and neck pain.  Neurological:  Negative for dizziness and headaches.  Hematological:  Does not bruise/bleed easily.  Psychiatric/Behavioral:  The patient is not nervous/anxious.   Blood pressure 113/77, pulse 77, temperature 98.3 F (36.8 C), temperature source Oral, resp. rate 16, SpO2 100 %. Physical Exam Constitutional:      General: He is not in acute distress.    Appearance: He is well-developed. He is not diaphoretic.  HENT:     Head: Normocephalic and atraumatic.  Eyes:     General: No scleral icterus.       Right eye: No discharge.        Left eye: No discharge.     Conjunctiva/sclera: Conjunctivae normal.  Cardiovascular:     Rate and Rhythm: Normal rate and regular rhythm.  Pulmonary:     Effort: Pulmonary effort is normal. No respiratory distress.  Musculoskeletal:     Cervical back: Normal range of motion.     Comments: LLE No traumatic wounds, ecchymosis, or rash  Mild TTP hip  No knee or ankle effusion  Knee stable to varus/ valgus and anterior/posterior stress  Sens DPN, SPN, TN paresthetic  Motor EHL, ext, flex, evers 5/5  DP 1+, PT 2+, No significant edema  Skin:    General: Skin is warm and dry.   Neurological:     Mental Status: He is alert.  Psychiatric:        Mood and Affect: Mood normal.        Behavior: Behavior normal.    Assessment/Plan: Pubic rami fx -- Pt may be WBAT LLE using RW for support. F/u with Dr. Stann Mainland in 2-3 weeks.    Lisette Abu, PA-C Orthopedic Surgery [phone] 01/21/2021, 2:57 PM    I have reviewed the note as outlined by Hilbert Odor, PA-C.  I reviewed medical record as well as images.  I agree with his assessment and plan.  He may continue with full weightbearing as tolerated.  He will likely need evaluation by physical therapy and potentially assessment for use of assistive device.  I will see him in the office in 2 to 3 weeks.

## 2021-01-21 NOTE — ED Provider Notes (Signed)
[facility] EMERGENCY DEPARTMENT Provider Note   CSN: [id number] Arrival date & time: 01/21/21  4098     History Chief Complaint  Patient presents with   Lytle Michaels    [name] is a 85 y.o. male.   Fall Pertinent negatives include no chest pain, no abdominal pain and no shortness of breath. Patient presents after a fall.  2 days ago fell while trying to step over a curb.  Landed on left hip.  Increasing pain today.  More difficulty walking.  Is on chronic oxygen.  Not on blood thinners.  No abdominal pain.  Did not hit head.  No chest pain.  No difficulty breathing above his baseline.  No abdominal pain.     Past Medical History:  Diagnosis Date   AAA (abdominal aortic aneurysm)    measures 2.7 cm.   Arthritis    Back pain    BPH (benign prostatic hypertrophy)    Bruises easily    CAD (coronary artery disease)    Cervical spine fracture ([place]) feb 1990   C 6, C 7 and T 1, no surgery done wore halo brace   CLL (chronic lymphocytic leukemia) ([place])    hx of worked up 2009 by dr Benay Spice, no tx done, released by dr sherrill   Complication of anesthesia    cannot lie on right side gets vertigo and trouble turning neck to right   COPD (chronic obstructive pulmonary disease) ([place]) may 2005   Erectile dysfunction    Fatigue    GERD (gastroesophageal reflux disease)    Glaucoma    Hepatitis as child   serum hepatitis   History of oxygen administration    oxygen  @2   l/m nasally at bedtime.   History of radiation therapy 06/22/16, 06/24/2016, 06/28/16   Left upper lobe of lung/ 54 Gy in 3 fractions   History of radiation therapy 02/13/2019-03/28/2019   IMRT to left lung    Dr Gery Pray   Hyperlipidemia    Hypertension    Impaired hearing    wears Hearing aids   Ischemic cardiomyopathy    Leg pain    Lumbar disc disease    Lung cancer ([place]) dx'd 03/18/16   Microscopic hematuria    Myocardial infarct ([place]) 03-17-2009   Hx MI 2000   Neuropathy    both  feet and legs   Osteopenia    Pneumonia    hx x2   Thyroid nodule     Patient Active Problem List   Diagnosis Date Noted   Intermediate stage nonexudative age-related macular degeneration of both eyes 12/23/2020   Acute on chronic respiratory failure with hypoxia (HCC)    Aspiration pneumonia ([place]) 09/15/2019   Pneumonia 09/15/2019   Lung mass 08/23/2019   Recurrent cancer of left lung of unknown cell type ([place]) 02/07/2019   Non-small cell carcinoma of left lung, stage 1 ([place]) 04/05/2016   Carotid stenosis 10/19/2011   Obstructive sleep apnea 09/13/2011   ACS (acute coronary syndrome) ([place]) 08/13/2011   COPD GOLD III with reversibility 08/11/2011   Ischemic cardiomyopathy 05/04/2011   AAA (abdominal aortic aneurysm) ([place]) 05/04/2011   CAD (coronary artery disease) 11/04/2010   Hyperlipidemia 11/04/2010   Leg pain 11/04/2010    Past Surgical History:  Procedure Laterality Date   APPENDECTOMY  sept, 10, 2005   BACK SURGERY  2009   L 2 and L 3    benign cyst removed from left neck  02-2010   CERVICAL  [place] SUR[facility]ERY  2005-2008   C 2, C 3 and C 4 2005, rods placed C 6, C 7 and T 1   COLONOSCOPY WITH PROPOFOL N/A 11/26/2014   Procedure: COLONOSCOPY WITH PROPOFOL;  Surgeon: Garlan Fair, MD;  Location: [facility] ENDOSCOPY;  Service: Endoscopy;  Laterality: N/A;   CORONARY ANGIOPLASTY WITH STENT PLACEMENT  2013   1 stent repair, one stent replaced   ESOPHAGOGASTRODUODENOSCOPY (EGD) WITH PROPOFOL N/A 11/27/2013   Procedure: ESOPHAGOGASTRODUODENOSCOPY (EGD) WITH PROPOFOL;  Surgeon: Garlan Fair, MD;  Location: [facility];  Service: Endoscopy;  Laterality: N/A;   EYE SURGERY Bilateral july 2011   eye surgry for glaucoma   FUDUCIAL PLACEMENT N/A 05/12/2016   Procedure: PLACEMENT OF FUDUCIAL;  Surgeon: Melrose Nakayama, MD;  Location: [place];  Service: Thoracic;  Laterality: N/A;   fusion L 3 and L 4  August 15, 2008   LEFT HEART CATHETERIZATION WITH CORONARY ANGIOGRAM N/A 08/12/2011    Procedure: LEFT HEART CATHETERIZATION WITH CORONARY ANGIOGRAM;  Surgeon: Larey Dresser, MD;  Location: [facility] CATH LAB;  Service: Cardiovascular;  Laterality: N/A;   NASAL SINUS SURGERY   oct 2005   right knee arthroscopy  1981   stent top heart  2011   x 2   TONSILLECTOMY  1943   VIDEO BRONCHOSCOPY WITH ENDOBRONCHIAL NAVIGATION N/A 05/12/2016   Procedure: VIDEO BRONCHOSCOPY WITH ENDOBRONCHIAL NAVIGATION;  Surgeon: Melrose Nakayama, MD;  Location: [place];  Service: Thoracic;  Laterality: N/A;   VIDEO BRONCHOSCOPY WITH ENDOBRONCHIAL NAVIGATION N/A 08/30/2019   Procedure: VIDEO BRONCHOSCOPY WITH ENDOBRONCHIAL NAVIGATION;  Surgeon: Melrose Nakayama, MD;  Location: [place];  Service: Thoracic;  Laterality: N/A;   VIDEO BRONCHOSCOPY WITH ENDOBRONCHIAL ULTRASOUND N/A 08/30/2019   Procedure: VIDEO BRONCHOSCOPY WITH ENDOBRONCHIAL ULTRASOUND;  Surgeon: Melrose Nakayama, MD;  Location: [facility] OR;  Service: Thoracic;  Laterality: N/A;       Family History  Problem Relation Age of Onset   Alzheimer's disease Mother        died of natural causes   Lung cancer Father 63       died.    Cancer Brother        jaw   Renal cancer Brother    Lung cancer Brother     Social History   Tobacco Use   Smoking status: Former    Packs/day: 2.00    Years: 55.00    Pack years: 110.00    Types: Cigarettes    Quit date: 07/14/2006    Years since quitting: 14.5   Smokeless tobacco: Never  Vaping Use   Vaping Use: Never used  Substance Use Topics   Alcohol use: No   Drug use: No    Home Medications Prior to Admission medications   Medication Sig Start Date End Date Taking? Authorizing Provider  albuterol (VENTOLIN HFA) 108 (90 Base) MCG/ACT inhaler Inhale 2 puffs into the lungs every 6 (six) hours as needed for wheezing or shortness of breath.     [provider]  aspirin 81 MG tablet Take 81 mg by mouth daily.  Patient not taking: Reported on 01/19/2021    [provider]   gabapentin (NEURONTIN) 300 MG capsule Take 600 mg by mouth 3 (three) times daily.     [provider]  latanoprost (XALATAN) 0.005 % ophthalmic solution Place 1 drop into both eyes at bedtime.  08/16/13   [provider]  Maltodextrin-Xanthan Gum ([place]) POWD Take by mouth as needed.  [provider]  metoprolol succinate (TOPROL-XL) 25 MG 24 hr tablet Take 0.5 tablets (12.5 mg total) by mouth every morning. 09/19/19   Thurnell Lose, MD  nitroGLYCERIN (NITROSTAT) 0.4 MG SL tablet Place 0.4 mg under the tongue every 5 (five) minutes as needed for chest pain. For chest pain     [provider]  OXYGEN Inhale 2 L into the lungs at bedtime.     [provider]  rosuvastatin (CRESTOR) 5 MG tablet Take 1 tablet (5 mg total) by mouth daily. Patient not taking: Reported on 01/19/2021 09/29/20 09/24/21  Adrian Prows, MD  tamsulosin (FLOMAX) 0.4 MG CAPS capsule Take 0.4 mg by mouth every evening.     [provider]  timolol (TIMOPTIC) 0.5 % ophthalmic solution Place 1 drop into both eyes 2 (two) times daily.  04/09/19   [provider]    Allergies    Entresto [sacubitril-valsartan], Iodinated diagnostic agents, Penicillins, Sulfonamide derivatives, Lumigan [bimatoprost], Lyrica [pregabalin], Symbicort [budesonide-formoterol fumarate], Valsartan, Advair diskus [fluticasone-salmeterol], Azithromycin, Brimonidine tartrate, Ciprofloxacin, Doxycycline, Flovent diskus [fluticasone furoate], Pulmicort [budesonide], Qvar [beclomethasone], Spiriva [tiotropium bromide monohydrate], Tessalon [benzonatate], Bacitracin, Flonase [fluticasone propionate], Relafen [nabumetone], and Tape  Review of Systems   Review of Systems  Constitutional:  Negative for appetite change.  HENT:  Negative for congestion.   Respiratory:  Negative for shortness of breath.   Cardiovascular:  Negative for chest pain.  Gastrointestinal:  Negative for abdominal  pain.  Genitourinary:  Negative for difficulty urinating and hematuria.  Musculoskeletal:        Left hip/pelvic pain.  Neurological:  Negative for numbness.  Hematological:  Negative for adenopathy.  Psychiatric/Behavioral:  Negative for confusion.    Physical Exam Updated Vital Signs BP 113/77   Pulse 77   Temp 98.3 F (36.8 C) (Oral)   Resp 16   SpO2 100%   Physical Exam Vitals and nursing note reviewed.  HENT:     Head: Normocephalic and atraumatic.     Nose:     Comments: On nasal cannula oxygen. Abdominal:     Tenderness: There is abdominal tenderness.     Comments: Mild tenderness left pelvic area.  Musculoskeletal:        General: Tenderness present.     Comments: Tenderness of left hip and left anterior pelvis.  Good range of motion hip.  Neurovascular tact in bilateral feet.  No lumbar tenderness.  Skin:    Capillary Refill: Capillary refill takes less than 2 seconds.  Neurological:     Mental Status: [name] is alert.    ED Results / Procedures / Treatments   Labs (all labs ordered are listed, but only abnormal results are displayed) Labs Reviewed  COMPREHENSIVE METABOLIC PANEL - Abnormal; Notable for the following components:      Result Value   Chloride 96 (*)    Glucose, Bld 123 (*)    Calcium 8.7 (*)    Total Bilirubin 1.5 (*)    All other components within normal limits  CBC WITH DIFFERENTIAL/PLATELET - Abnormal; Notable for the following components:   WBC 17.7 (*)    RBC 4.20 (*)    Platelets 121 (*)    Neutro Abs 7.8 (*)    Lymphs Abs 8.9 (*)    Basophils Absolute 0.2 (*)    All other components within normal limits  URINALYSIS, ROUTINE W REFLEX MICROSCOPIC    EKG None  Radiology CT PELVIS WO CONTRAST  Result Date: 01/21/2021 CLINICAL DATA:  Left hip pain  after fall yesterday. EXAM: CT PELVIS WITHOUT CONTRAST TECHNIQUE: Multidetector CT imaging of the pelvis was performed following the standard protocol without intravenous contrast.  COMPARISON:  Radiograph same day. FINDINGS: Urinary Tract:  No abnormality visualized. Bowel:  Unremarkable visualized pelvic bowel loops. Vascular/Lymphatic: Atherosclerosis of visualized abdominal aorta. Probable infrarenal abdominal aortic aneurysm. No significant adenopathy is noted. Reproductive:  Prostate is unremarkable. Other: Moderate pelvic hematoma is noted anteriorly and inferior to urinary bladder. Musculoskeletal: Mildly displaced and possibly comminuted fracture is seen involving the left parasymphyseal region extending from junction with left superior pubic ramus to the left inferior pubic ramus. IMPRESSION: Mildly displaced and possibly comminuted fracture is seen involving the left parasymphyseal region of the pubic bone extending from junction with left superior pubic ramus to the left inferior pubic ramus. Moderate pelvic hematoma is noted anteriorly and inferior to the urinary bladder. Probable infrarenal abdominal aortic aneurysm which is incompletely visualized. Further evaluation with ultrasound or CT scan of the abdomen is recommended. Aortic Atherosclerosis (ICD10-I70.0). Electronically Signed   By: Marijo Conception M.D.   On: 01/21/2021 12:35   DG Hip Unilat With Pelvis 2-3 Views Left  Result Date: 01/21/2021 CLINICAL DATA:  Left hip and groin pain since fall yesterday. EXAM: DG HIP (WITH OR WITHOUT PELVIS) 2-3V LEFT COMPARISON:  None. FINDINGS: Acute nondisplaced fracture of the left parasymphyseal pubic bone. No additional fracture. No dislocation. Joint spaces are preserved. Osteopenia. Prior lumbar fusion. Soft tissues are unremarkable. IMPRESSION: 1. Acute nondisplaced left parasymphyseal pubic bone fracture. Electronically Signed   By: Titus Dubin M.D.   On: 01/21/2021 10:16    Procedures Procedures   Medications Ordered in ED Medications - No data to display  ED Course  I have reviewed the triage vital signs and the nursing notes.  Pertinent labs & imaging results  that were available during my care of the patient were reviewed by me and considered in my medical decision making (see chart for details).    MDM Rules/Calculators/A&P                           Patient with fall 2 days ago.  Anterior pelvic fractures.  Seen by orthopedic surgery.  Follow-up in 2 to 3 weeks with Dr. Stann Mainland.  Not on blood thinners.  CT scan did show a hematoma in the area of the bladder.  We will get urinalysis.  Hemoglobin stable at baseline.  Not hypotensive.  With the fact the fall was 2 days ago I think the patient stable.  Has IV contrast allergy and I think it is reasonable to stay with a noncontrast scan now.  Doubt expanding hematoma.  Instructed patient however on follow-up precautions.  Likely discharge home.  Care turned over to Dr. Tyrone Nine Final Clinical Impression(s) / ED Diagnoses Final diagnoses:  Fall, initial encounter  Closed nondisplaced fracture of pelvis, unspecified part of pelvis, initial encounter [facility])    Rx / [place] Orders ED Discharge Orders     None        Davonna Belling, MD 01/21/21 [phone]

## 2021-01-21 NOTE — Discharge Instructions (Addendum)
Follow-up with Dr. Stann Mainland in 2 to 3 weeks.  Weight-bear as tolerated with your walker.  Return for worsening lightheadedness or dizziness

## 2021-01-21 NOTE — ED Notes (Signed)
Ortho at bedside.

## 2021-01-21 NOTE — ED Notes (Signed)
Patient transported to X-ray 

## 2021-01-21 NOTE — ED Notes (Signed)
Pt verbalized understanding of d/c instructions, meds, and followup care. Denies questions. VSS, no distress noted. Assisted to wheelchair and to lobby with family.

## 2021-01-21 NOTE — ED Notes (Signed)
Pt returned from X-ray.  

## 2021-01-21 NOTE — ED Triage Notes (Signed)
Patient here for evaluation of left hip pain after tripping over a parking barrier yesterday and falling to the ground. Patient wears 2L O2 [place] at baseline. Patient alert, oriented, and in no apparent distress at this time.

## 2021-02-11 DIAGNOSIS — M25552 Pain in left hip: Secondary | ICD-10-CM | POA: Diagnosis not present

## 2021-02-11 DIAGNOSIS — S329XXA Fracture of unspecified parts of lumbosacral spine and pelvis, initial encounter for closed fracture: Secondary | ICD-10-CM | POA: Diagnosis not present

## 2021-02-11 DIAGNOSIS — Z23 Encounter for immunization: Secondary | ICD-10-CM | POA: Diagnosis not present

## 2021-02-17 ENCOUNTER — Ambulatory Visit
Admission: RE | Admit: 2021-02-17 | Discharge: 2021-02-17 | Disposition: A | Discharge status: Home / Self Care | Payer: Medicare Other | Source: Ambulatory Visit | Attending: Internal Medicine | Admitting: Internal Medicine

## 2021-02-17 ENCOUNTER — Other Ambulatory Visit: Payer: Self-pay | Admitting: Internal Medicine

## 2021-02-17 DIAGNOSIS — R0602 Shortness of breath: Secondary | ICD-10-CM | POA: Diagnosis not present

## 2021-02-17 DIAGNOSIS — R0781 Pleurodynia: Secondary | ICD-10-CM | POA: Diagnosis not present

## 2021-02-17 DIAGNOSIS — J441 Chronic obstructive pulmonary disease with (acute) exacerbation: Secondary | ICD-10-CM | POA: Diagnosis not present

## 2021-02-20 ENCOUNTER — Encounter (HOSPITAL_COMMUNITY): Payer: Self-pay | Admitting: Radiology

## 2021-03-24 DIAGNOSIS — H401133 Primary open-angle glaucoma, bilateral, severe stage: Secondary | ICD-10-CM | POA: Diagnosis not present

## 2021-03-25 DIAGNOSIS — S329XXS Fracture of unspecified parts of lumbosacral spine and pelvis, sequela: Secondary | ICD-10-CM | POA: Diagnosis not present

## 2021-05-04 ENCOUNTER — Ambulatory Visit
Admission: RE | Admit: 2021-05-04 | Discharge: 2021-05-04 | Disposition: A | Discharge status: Home / Self Care | Payer: Medicare Other | Source: Ambulatory Visit | Attending: Physician Assistant | Admitting: Physician Assistant

## 2021-05-04 ENCOUNTER — Other Ambulatory Visit: Payer: Self-pay | Admitting: Physician Assistant

## 2021-05-04 DIAGNOSIS — R059 Cough, unspecified: Secondary | ICD-10-CM | POA: Diagnosis not present

## 2021-05-04 DIAGNOSIS — R051 Acute cough: Secondary | ICD-10-CM

## 2021-05-04 DIAGNOSIS — R0602 Shortness of breath: Secondary | ICD-10-CM | POA: Diagnosis not present

## 2021-05-04 DIAGNOSIS — J441 Chronic obstructive pulmonary disease with (acute) exacerbation: Secondary | ICD-10-CM | POA: Diagnosis not present

## 2021-05-07 DIAGNOSIS — J9611 Chronic respiratory failure with hypoxia: Secondary | ICD-10-CM | POA: Diagnosis not present

## 2021-05-07 DIAGNOSIS — I714 Abdominal aortic aneurysm, without rupture, unspecified: Secondary | ICD-10-CM | POA: Diagnosis not present

## 2021-05-07 DIAGNOSIS — E782 Mixed hyperlipidemia: Secondary | ICD-10-CM | POA: Diagnosis not present

## 2021-05-07 DIAGNOSIS — Z85118 Personal history of other malignant neoplasm of bronchus and lung: Secondary | ICD-10-CM | POA: Diagnosis not present

## 2021-05-07 DIAGNOSIS — J449 Chronic obstructive pulmonary disease, unspecified: Secondary | ICD-10-CM | POA: Diagnosis not present

## 2021-05-07 DIAGNOSIS — R7303 Prediabetes: Secondary | ICD-10-CM | POA: Diagnosis not present

## 2021-05-07 DIAGNOSIS — Z1389 Encounter for screening for other disorder: Secondary | ICD-10-CM | POA: Diagnosis not present

## 2021-05-07 DIAGNOSIS — I5042 Chronic combined systolic (congestive) and diastolic (congestive) heart failure: Secondary | ICD-10-CM | POA: Diagnosis not present

## 2021-05-07 DIAGNOSIS — I252 Old myocardial infarction: Secondary | ICD-10-CM | POA: Diagnosis not present

## 2021-05-07 DIAGNOSIS — I700 Atherosclerosis of aorta: Secondary | ICD-10-CM | POA: Diagnosis not present

## 2021-05-07 DIAGNOSIS — Z0000 Encounter for general adult medical examination without abnormal findings: Secondary | ICD-10-CM | POA: Diagnosis not present

## 2021-05-07 DIAGNOSIS — I739 Peripheral vascular disease, unspecified: Secondary | ICD-10-CM | POA: Diagnosis not present

## 2021-05-07 DIAGNOSIS — N182 Chronic kidney disease, stage 2 (mild): Secondary | ICD-10-CM | POA: Diagnosis not present

## 2021-05-07 DIAGNOSIS — I10 Essential (primary) hypertension: Secondary | ICD-10-CM | POA: Diagnosis not present

## 2021-06-25 ENCOUNTER — Encounter (INDEPENDENT_AMBULATORY_CARE_PROVIDER_SITE_OTHER): Payer: Medicare Other | Admitting: Ophthalmology

## 2021-07-06 ENCOUNTER — Telehealth: Payer: Self-pay | Admitting: *Deleted

## 2021-07-06 DIAGNOSIS — J302 Other seasonal allergic rhinitis: Secondary | ICD-10-CM | POA: Diagnosis not present

## 2021-07-06 NOTE — Telephone Encounter (Signed)
CALLED PATIENT TO INFORM OF CT FOR 07-17-21- ARRIVAL TIME- 10:15 AM, NO RESTRICTIONS TO TEST, AND PATIENT TO RECEIVE RESULTS FROM DR. KINARD ON 07-23-21 @ 11 AM, SPOKE WITH PATIENT AND HE IS AWARE OF THESE APPTS. ?

## 2021-07-17 ENCOUNTER — Ambulatory Visit (HOSPITAL_COMMUNITY)
Admission: RE | Admit: 2021-07-17 | Discharge: 2021-07-17 | Disposition: A | Discharge status: Home / Self Care | Payer: Medicare Other | Source: Ambulatory Visit | Attending: Radiation Oncology | Admitting: Radiation Oncology

## 2021-07-17 DIAGNOSIS — R911 Solitary pulmonary nodule: Secondary | ICD-10-CM | POA: Diagnosis not present

## 2021-07-17 DIAGNOSIS — I700 Atherosclerosis of aorta: Secondary | ICD-10-CM | POA: Diagnosis not present

## 2021-07-17 DIAGNOSIS — C3492 Malignant neoplasm of unspecified part of left bronchus or lung: Secondary | ICD-10-CM

## 2021-07-17 DIAGNOSIS — C3490 Malignant neoplasm of unspecified part of unspecified bronchus or lung: Secondary | ICD-10-CM | POA: Diagnosis not present

## 2021-07-17 DIAGNOSIS — J439 Emphysema, unspecified: Secondary | ICD-10-CM | POA: Diagnosis not present

## 2021-07-20 DIAGNOSIS — R0989 Other specified symptoms and signs involving the circulatory and respiratory systems: Secondary | ICD-10-CM | POA: Diagnosis not present

## 2021-07-20 DIAGNOSIS — J449 Chronic obstructive pulmonary disease, unspecified: Secondary | ICD-10-CM | POA: Diagnosis not present

## 2021-07-20 DIAGNOSIS — J9611 Chronic respiratory failure with hypoxia: Secondary | ICD-10-CM | POA: Diagnosis not present

## 2021-07-22 NOTE — Progress Notes (Signed)
?Radiation Oncology         [phone] ?________________________________ ? ?Name: [name] MRN: [id number]  ?Date: 07/23/2021  DOB: 1936-01-06 ? ?Follow-Up Visit Note ? ?CC: Wenda Low, MD  Wenda Low, MD ? ?  ICD-10-CM   ?1. Recurrent cancer of left lung of unknown cell type (HCC)  C34.92 CT CHEST WO CONTRAST  ?  ?2. Non-small cell carcinoma of left lung, stage 1 (HCC)  C34.92   ?  ? ? ?Diagnosis: Recurrent non-small cell lung cancer ? ?Interval Since Last Radiation: 3 months and 28 days  ? ?Radiation Treatment Dates: 02/13/2019 through 03/28/2019 ?Site Technique Total Dose (Gy) Dose per Fx (Gy) Completed Fx Beam Energies  ?Lung, Left: Lung_Lt IMRT 60/60 2 30/30 6X  ? ? ?Narrative:  The patient returns today for routine follow-up and to review recent imaging, he was last seen here for follow up on 01/19/21. Since his last visit, the patient presented to the ED on 01/21/21 for evaluation of increasing hip pain due to a fall 2 days prior . X-ray of the left hip showed findings consistent with an acute nondisplaced left parasymphyseal pubic bone fracture. Pelvic CT again revealed a mildly displaced and possibly comminuted fracture involving the left parasymphyseal region of the pubic bone, extending from junction with left superior pubic ramus to the left inferior pubic ramus. CT also showed a moderate pelvic hematoma anteriorly and inferiorly to the urinary bladder, as well as a probable infrarenal abdominal aortic aneurysm which was incompletely visualized. Given hematoma near the bladder, a urine culture performed which was negative. The patient was discharged home that same day without hospital intervention needed.            ? ?His most recent chest CT on 07/17/21 showed stability of the post treatment/post radiation fibrotic consolidation in ?the subpleural anterior left upper lobe. CT also showed: the subpleural nodule of the dependent right lower lobe as stable in the interval, emphysema,  diffuse bilateral bronchial wall thickening, CAD, enlargement of the main pulmonary artery (consistent with pulmonary hypertension), and aortic valve calcifications.  ? ?He reports his breathing is stable.  He continues on 2 L of oxygen.  He ambulates for all of his appointments.  He denies any pain within the chest area significant cough or hemoptysis.  He reports chronic hoarseness.  He denies any significant problems with aspiration. ? ? ? ?Allergies:  is allergic to entresto [sacubitril-valsartan], iodinated contrast media, penicillins, sulfonamide derivatives, lumigan [bimatoprost], lyrica [pregabalin], symbicort [budesonide-formoterol fumarate], valsartan, advair diskus [fluticasone-salmeterol], azithromycin, brimonidine tartrate, ciprofloxacin, doxycycline, flovent diskus [fluticasone furoate], pulmicort [budesonide], qvar [beclomethasone], spiriva [tiotropium bromide monohydrate], tessalon [benzonatate], bacitracin, flonase [fluticasone propionate], relafen [nabumetone], and tape. ? ?Meds: ?Current Outpatient Medications  ?Medication Sig Dispense Refill  ? albuterol (VENTOLIN HFA) 108 (90 Base) MCG/ACT inhaler Inhale 2 puffs into the lungs every 6 (six) hours as needed for wheezing or shortness of breath.     ? gabapentin (NEURONTIN) 300 MG capsule Take 600 mg by mouth 3 (three) times daily.     ? latanoprost (XALATAN) 0.005 % ophthalmic solution Place 1 drop into both eyes at bedtime.     ? Maltodextrin-Xanthan Gum (RES[employer]ICKEN[employer] CLEAR) POWD Take by mouth as needed.    ? metoprolol succinate (TOPROL-XL) 25 MG 24 hr tablet Take 0.5 tablets (12.5 mg total) by mouth every morning. 60 tablet 0  ? nitroGLYCERIN (NITROSTAT) 0.4 MG SL tablet Place 0.4 mg under the tongue every 5 (five) minutes as needed for  chest pain. For chest pain     ? OXYGEN Inhale 2 L into the lungs at bedtime.     ? rosuvastatin (CRESTOR) 5 MG tablet Take 1 tablet (5 mg total) by mouth daily. 90 tablet 3  ? tamsulosin (FLOMAX) 0.4 MG  CAPS capsule Take 0.4 mg by mouth every evening.     ? timolol (TIMOPTIC) 0.5 % ophthalmic solution Place 1 drop into both eyes 2 (two) times daily.     ? aspirin 81 MG tablet Take 81 mg by mouth daily.  (Patient not taking: Reported on 07/23/2021)    ? ?No current facility-administered medications for this encounter.  ? ? ?Physical Findings: ?The patient is in no acute distress. Patient is alert and oriented.  Supplemental oxygen in place. ? height is 5\' 11"  (1.803 m) and weight is 167 lb 6.4 oz (75.9 kg). His temporal temperature is 97.1 ?F (36.2 ?C) (abnormal). His blood pressure is 126/77 and his pulse is 75. His respiration is 22 (abnormal) and oxygen saturation is 97%. .   Lungs are clear to auscultation bilaterally. Heart has regular rate and rhythm. No palpable cervical, supraclavicular, or axillary adenopathy. Abdomen soft, non-tender, normal bowel sounds.  ? ? ?Lab Findings: ?Lab Results  ?Component Value Date  ? WBC 17.7 (H) 01/21/2021  ? HGB 13.5 01/21/2021  ? HCT 40.3 01/21/2021  ? MCV 96.0 01/21/2021  ? PLT 121 (L) 01/21/2021  ? ? ?Radiographic Findings: ?CT CHEST WO CONTRAST ? ?Result Date: 07/19/2021 ?CLINICAL DATA:  Non-small cell lung cancer restaging, assess treatment response * Tracking Code: BO * EXAM: CT CHEST WITHOUT CONTRAST TECHNIQUE: Multidetector CT imaging of the chest was performed following the standard protocol without IV contrast. RADIATION DOSE REDUCTION: This exam was performed according to the departmental dose-optimization program which includes automated exposure control, adjustment of the mA and/or kV according to patient size and/or use of iterative reconstruction technique. COMPARISON:  01/15/2021 FINDINGS: Cardiovascular: Aortic atherosclerosis. Aortic valve calcifications. Normal heart size. Subendocardial fibrofatty scarring of the left ventricular apex, in keeping with prior infarction (series 2, image 100). Extensive 3 vessel coronary artery calcifications and/or stents.  Enlargement of the main pulmonary artery measuring up to 3.6 cm in caliber. No pericardial effusion. Mediastinum/Nodes: No enlarged mediastinal, hilar, or axillary lymph nodes. Thyroid gland, trachea, and esophagus demonstrate no significant findings. Lungs/Pleura: Moderate centrilobular emphysema. Diffuse bilateral bronchial wall thickening. Scarring and volume loss of the left lung base elevation of the left hemidiaphragm. Unchanged post treatment fibrotic consolidation of the subpleural anterior left upper lobe measuring 3.0 x 1.8 cm, containing a fiducial marker (series 5, image 29). Unchanged subpleural nodule of the dependent right lower lobe measuring 1.7 x 0.8 cm (series 5, image 118). No pleural effusion or pneumothorax. Upper Abdomen: No acute abnormality. Musculoskeletal: No chest wall abnormality. No suspicious osseous lesions identified. IMPRESSION: 1. Unchanged post treatment/post radiation fibrotic consolidation of the subpleural anterior left upper lobe containing a fiducial marker. 2. Unchanged subpleural nodule of the dependent right lower lobe. Attention on follow-up. 3. Emphysema and diffuse bilateral bronchial wall thickening. 4. Coronary artery disease. 5. Enlargement of the main pulmonary artery as can be seen in pulmonary hypertension. 6. Aortic valve calcifications. Correlate for echocardiographic evidence of aortic valve dysfunction. Aortic Atherosclerosis (ICD10-I70.0) and Emphysema (ICD10-J43.9). Electronically Signed   By: Delanna Ahmadi M.D.   On: 07/19/2021 16:01   ? ?Impression: Recurrent non-small cell lung cancer ? ?No evidence of recurrence on clinical exam today.  Most recent chest CT  scan shows no additional recurrence.  The patient is happy to hear these results.  He has recovered completely from his pelvic fracture. ? ?Plan: Routine follow-up in 6 months.  Prior to this follow-up appointment the patient will have a repeat CT scan of the chest. ? ? ?25 minutes of total time was  spent for this patient encounter, including preparation, face-to-face counseling with the patient and coordination of care, physical exam, and documentation of the encounter. ?_______________________________

## 2021-07-23 ENCOUNTER — Other Ambulatory Visit: Payer: Self-pay

## 2021-07-23 ENCOUNTER — Ambulatory Visit
Admission: RE | Admit: 2021-07-23 | Discharge: 2021-07-23 | Disposition: A | Discharge status: Home / Self Care | Payer: Medicare Other | Source: Ambulatory Visit | Attending: Radiation Oncology | Admitting: Radiation Oncology

## 2021-07-23 VITALS — BP 126/77 | HR 75 | Temp 97.1°F | Resp 22 | Ht 71.0 in | Wt 167.4 lb

## 2021-07-23 DIAGNOSIS — C3492 Malignant neoplasm of unspecified part of left bronchus or lung: Secondary | ICD-10-CM

## 2021-07-23 DIAGNOSIS — Z923 Personal history of irradiation: Secondary | ICD-10-CM | POA: Insufficient documentation

## 2021-07-23 DIAGNOSIS — I2510 Atherosclerotic heart disease of native coronary artery without angina pectoris: Secondary | ICD-10-CM | POA: Diagnosis not present

## 2021-07-23 DIAGNOSIS — I272 Pulmonary hypertension, unspecified: Secondary | ICD-10-CM | POA: Insufficient documentation

## 2021-07-23 DIAGNOSIS — J432 Centrilobular emphysema: Secondary | ICD-10-CM | POA: Insufficient documentation

## 2021-07-23 DIAGNOSIS — Z87891 Personal history of nicotine dependence: Secondary | ICD-10-CM | POA: Diagnosis not present

## 2021-07-23 DIAGNOSIS — Z85118 Personal history of other malignant neoplasm of bronchus and lung: Secondary | ICD-10-CM | POA: Insufficient documentation

## 2021-07-23 DIAGNOSIS — I700 Atherosclerosis of aorta: Secondary | ICD-10-CM | POA: Insufficient documentation

## 2021-07-23 DIAGNOSIS — C3412 Malignant neoplasm of upper lobe, left bronchus or lung: Secondary | ICD-10-CM | POA: Diagnosis not present

## 2021-07-23 NOTE — Progress Notes (Signed)
[name] is here today for follow up post radiation to the lung. ? ?Lung Side: Left, patient completed treatment on 03/28/19. ? ?Does the patient complain of any of the following: ?Pain: No ?Shortness of breath w/wo exertion: Yes, on exertion patient on 2 liters of oxygen.  ?Cough: yes, patient has productive cough.  ?Hemoptysis: No ?Pain with swallowing: Yes, patient using thicker to liquids.  ?Swallowing/choking concerns: yes, patient reports having bronchial irritation.  ?Appetite: Good ?Weight:  ?Wt Readings from Last 3 Encounters:  ?07/23/21 167 lb 6.4 oz (75.9 kg)  ?01/19/21 176 lb 6 oz (80 kg)  ?09/29/20 176 lb 3.2 oz (79.9 kg)  ?  ?Energy Level:  Low due to decreased oxygen saturation when exerting.  ?Post radiation skin Changes: No ? ? ? ?Additional comments if applicable:  ?Vitals:  ? 07/23/21 1023  ?BP: 126/77  ?Pulse: 75  ?Resp: (!) 22  ?Temp: (!) 97.1 ?F (36.2 ?C)  ?TempSrc: Temporal  ?SpO2: 97%  ?Weight: 167 lb 6.4 oz (75.9 kg)  ?Height: 5\' 11"  (1.803 m)  ?  ?

## 2021-09-30 ENCOUNTER — Ambulatory Visit: Payer: Medicare Other | Admitting: Cardiology

## 2021-09-30 ENCOUNTER — Encounter: Payer: Self-pay | Admitting: Cardiology

## 2021-09-30 VITALS — BP 125/75 | HR 71 | Temp 98.5°F | Resp 16 | Ht 71.0 in | Wt 166.2 lb

## 2021-09-30 DIAGNOSIS — J432 Centrilobular emphysema: Secondary | ICD-10-CM | POA: Diagnosis not present

## 2021-09-30 DIAGNOSIS — I7141 Pararenal abdominal aortic aneurysm, without rupture: Secondary | ICD-10-CM

## 2021-09-30 DIAGNOSIS — E7800 Pure hypercholesterolemia, unspecified: Secondary | ICD-10-CM | POA: Diagnosis not present

## 2021-09-30 DIAGNOSIS — I25118 Atherosclerotic heart disease of native coronary artery with other forms of angina pectoris: Secondary | ICD-10-CM

## 2021-09-30 DIAGNOSIS — I5042 Chronic combined systolic (congestive) and diastolic (congestive) heart failure: Secondary | ICD-10-CM

## 2021-09-30 NOTE — Progress Notes (Signed)
Primary Physician/Referring:  Wenda Low, MD  Patient ID: [name], male    DOB: 06/20/1935, 86 y.o.   MRN: [id number]  Chief Complaint  Patient presents with   Coronary Artery Disease   Hyperlipidemia   COPD    1 year   HPI:    HPI: Shavon Zenz  is a 86 y.o. Caucasian male with history of non-STEMI and PCI with DES to RCA in 2011 and circumflex stenting in 2013, ischemic cardiomyopathy with mildly reduced LVEF around 40-45%, hypertension and hyperlipidemia, asymptomatic right carotid stenosis, small 3.3 cm stable AAA since 1980s, severe COPD from prior tobacco use and presently on nocturnal and also uses oxygen when he is exerting. He has history of sleep apnea unable to tolerate CPAP multiple medication allergies, chronic back pain and spinal fusion in the past. History of lung cancer status post radiation therapy and follows Dr. Gery Pray since February 2018.  He presents for annual visit, presently doing well and has not had any recent hospitalization for any decompensated heart failure or angina pectoris.  No new symptomatology.  States that his oxygen needs are essentially stable at 2 L/min but does fall down when he does walk.  Past Medical History:  Diagnosis Date   AAA (abdominal aortic aneurysm) (HCC)    measures 2.7 cm.   Arthritis    Back pain    BPH (benign prostatic hypertrophy)    Bruises easily    CAD (coronary artery disease)    Cervical spine fracture ([place]) feb 1990   C 6, C 7 and T 1, no surgery done wore halo brace   CLL (chronic lymphocytic leukemia) ([place])    hx of worked up 2009 by dr Benay Spice, no tx done, released by dr sherrill   Complication of anesthesia    cannot lie on right side gets vertigo and trouble turning neck to right   COPD (chronic obstructive pulmonary disease) ([place]) may 2005   Erectile dysfunction    Fatigue    GERD (gastroesophageal reflux disease)    Glaucoma    Hepatitis as child   serum hepatitis   History of  oxygen administration    oxygen  @2   l/m nasally at bedtime.   History of radiation therapy 06/22/16, 06/24/2016, 06/28/16   Left upper lobe of lung/ 54 Gy in 3 fractions   History of radiation therapy 02/13/2019-03/28/2019   IMRT to left lung    Dr Gery Pray   Hyperlipidemia    Hypertension    Impaired hearing    wears Hearing aids   Ischemic cardiomyopathy    Leg pain    Lumbar disc disease    Lung cancer ([place]) dx'd 03/18/16   Microscopic hematuria    Myocardial infarct ([place]) 03-17-2009   Hx MI 2000   Neuropathy    both feet and legs   Osteopenia    Pneumonia    hx x2   Thyroid nodule     Social History   Tobacco Use   Smoking status: Former    Packs/day: 2.00    Years: 55.00    Total pack years: 110.00    Types: Cigarettes    Quit date: 07/14/2006    Years since quitting: 15.2   Smokeless tobacco: Never  Substance Use Topics   Alcohol use: No  Marital Status: Married    ROS  Review of Systems  HENT:  Positive for hoarse voice.   Cardiovascular:  Positive for dyspnea on exertion. Negative for chest  pain, claudication and leg swelling.  Musculoskeletal:  Positive for back pain.  Gastrointestinal:  Negative for melena.   Objective  Blood pressure 125/75, pulse 71, temperature 98.5 F (36.9 C), temperature source Temporal, resp. rate 16, height 5' 11"  (1.803 m), weight 166 lb 3.2 oz (75.4 kg), SpO2 98 %. Body mass index is 23.18 kg/m.      09/30/2021   10:45 AM 07/23/2021   10:23 AM 01/21/2021    4:30 PM  Vitals with BMI  Height 5' 11"  5' 11"    Weight 166 lbs 3 oz 167 lbs 6 oz   BMI 46.27 03.50   Systolic 093 818 299  Diastolic 75 77 78  Pulse 71 75 79    Physical Exam Constitutional:      General: He is not in acute distress. Eyes:     Conjunctiva/sclera: Conjunctivae normal.  Neck:     Thyroid: No thyromegaly.     Vascular: No carotid bruit or JVD.  Cardiovascular:     Rate and Rhythm: Normal rate and regular rhythm.     Pulses:          Femoral  pulses are 2+ on the right side and 2+ on the left side.      Popliteal pulses are 1+ on the right side and 1+ on the left side.       Dorsalis pedis pulses are 0 on the right side and 0 on the left side.       Posterior tibial pulses are 0 on the right side and 0 on the left side.     Heart sounds: Normal heart sounds. No murmur heard.    No gallop.  Pulmonary:     Effort: Pulmonary effort is normal. No respiratory distress.     Breath sounds: Examination of the right-lower field reveals rhonchi. Examination of the left-lower field reveals rhonchi. Rhonchi present.  Abdominal:     General: Bowel sounds are normal.     Palpations: Abdomen is soft.  Musculoskeletal:     Right lower leg: No edema.     Left lower leg: No edema.  Skin:    Capillary Refill: Capillary refill takes less than 2 seconds.    Radiology:  CT chest without contrast 07/17/2021: 1. Unchanged post treatment/post radiation fibrotic consolidation of the subpleural anterior left upper lobe containing a fiducial marker. 2. Unchanged subpleural nodule of the dependent right lower lobe. Attention on follow-up. 3. Emphysema and diffuse bilateral bronchial wall thickening. 4. Coronary artery disease. 5. Enlargement of the main pulmonary artery as can be seen in pulmonary hypertension. 6. Aortic valve calcifications. Correlate for echocardiographic evidence of aortic valve dysfunction.  Laboratory examination:   External labs:  Labs 05/07/2021:  A1c 6.1%.  TSH 1.00.  Total cholesterol 115, triglycerides 82, HDL 32, LDL 67.  BUN 14, creatinine 0.89, EGFR 85 mill, potassium 4.4, LFTs normal.  Hb 14.0/HCT 41.7, platelets 114.  Normal indicis.    Medications    Current Outpatient Medications:    albuterol (VENTOLIN HFA) 108 (90 Base) MCG/ACT inhaler, Inhale 2 puffs into the lungs every 6 (six) hours as needed for wheezing or shortness of breath. , Disp: , Rfl:    gabapentin (NEURONTIN) 300 MG capsule, Take  600 mg by mouth 3 (three) times daily. , Disp: , Rfl:    latanoprost (XALATAN) 0.005 % ophthalmic solution, Place 1 drop into both eyes at bedtime. , Disp: , Rfl:    Maltodextrin-Xanthan Gum (RESOURCE THICKENUP CLEAR) POWD, Take by  mouth as needed., Disp: , Rfl:    metoprolol succinate (TOPROL-XL) 25 MG 24 hr tablet, Take 0.5 tablets (12.5 mg total) by mouth every morning. (Patient taking differently: Take 25 mg by mouth every morning.), Disp: 60 tablet, Rfl: 0   nitroGLYCERIN (NITROSTAT) 0.4 MG SL tablet, Place 0.4 mg under the tongue every 5 (five) minutes as needed for chest pain. For chest pain , Disp: , Rfl:    OXYGEN, Inhale 2 L into the lungs at bedtime. , Disp: , Rfl:    rosuvastatin (CRESTOR) 5 MG tablet, Take 1 tablet (5 mg total) by mouth daily., Disp: 90 tablet, Rfl: 3   tamsulosin (FLOMAX) 0.4 MG CAPS capsule, Take 0.4 mg by mouth every evening. , Disp: , Rfl:    timolol (TIMOPTIC) 0.5 % ophthalmic solution, Place 1 drop into both eyes 2 (two) times daily. , Disp: , Rfl:   Cardiac Studies:   Coronary Angiography  PCI  In 2011 (DES to RCA) and [place] and PCI in 07/2011 (DES to proximal CX and balloon angioplasty to proximal RCA in-stent restenosis),  Abdominal Aortic Duplex  07/18/2015: 3.3 cm distal abdominal aortic aneurysm with aneurysmal dilatation over 6 cm length noted. Similar findings noted on priors study. Recommend followup by [place] in 3 years.   Carotid artery duplex 03/27/2018: Moderate amount of plaque at the carotid bulbs and internal carotid arteries, right side greater than left. Estimated degree of stenosis in the internal carotid arteries is less than 50% bilaterally. Patent vertebral arteries with antegrade flow.  Echocardiogram 09/15/2019:  1. Left ventricular ejection fraction, by estimation, is 40 to 45%. Left ventricular ejection fraction by 2D MOD biplane is 35.4 %. The left ventricle has mildly decreased function. The left ventricle demonstrates regional wall  motion abnormalities (see  scoring diagram/findings for description). Left ventricular diastolic parameters are consistent with Grade I diastolic dysfunction (impaired relaxation). There is mild hypokinesis of the left ventricular, entire inferolateral wall.  2. Right ventricular systolic function is normal. The right ventricular size is normal.  3. The mitral valve is normal in structure. No evidence of mitral valve regurgitation. No evidence of mitral stenosis.  4. The aortic valve is normal in structure. Aortic valve regurgitation is not visualized. No aortic stenosis is present.  5. The inferior vena cava is dilated in size with >50% respiratory variability, suggesting right atrial pressure of 8 mmHg.  EKG:  EKG 09/30/2021: Normal sinus rhythm with rate of 62 bpm, normal EKG.  No change from 09/29/2020.  Assessment     ICD-10-CM   1. Coronary artery disease of native artery of native heart with stable angina pectoris ([place])  I25.118 EKG 12-Lead    2. Chronic combined systolic and diastolic heart failure (HCC)  I50.42 PCV ECHOCARDIOGRAM COMPLETE    3. Centrilobular emphysema ([place])  J43.2     4. Hypercholesteremia  E78.00     5. Pararenal abdominal aortic aneurysm (AAA) without rupture ([place])  I71.41 PCV AORTA DUPLEX      Recommendations:   KYSHAWN TEAL  is a 86 y.o.Caucasian male with history of non-STEMI and PCI with DES to RCA in 2011 and circumflex stenting in 2013, ischemic cardiomyopathy with mildly reduced LVEF around 40-45%, hypertension and hyperlipidemia, asymptomatic right carotid stenosis, small 3.3 cm stable AAA since 1980s, severe COPD from prior tobacco use and presently on nocturnal and also uses oxygen when he is exerting. He has history of sleep apnea unable to tolerate CPAP multiple medication allergies, chronic back pain and  spinal fusion in the past. History of lung cancer status post radiation therapy and follows Dr. Gery Pray since February 2018.  He presents for  annual visit, presently doing well and has not had any recent hospitalization for any decompensated heart failure or angina pectoris.  From cardiac standpoint he has remained stable.  I reviewed his CT scan from recent screening, he does have pulmonary hypertension however clinically he has done well without any right-sided heart failure symptoms.  His oxygen needs has not changed.  In view of this we could continue observation for now, his last echocardiogram was in 2021.  It may be worthwhile to repeat echocardiogram to follow-up on pulmonary hypertension if present, it would be WHO group 3.  Small AAA has remained stable, he has not had any recent reevaluation for the same.  I will go ahead and order the aortic duplex as well.  His labs including lipids, CBC and BMP are stable.  He is tolerating Crestor 5 mg daily.  I am happy to have seen him today, overall stable, I will see him back in a year again.  EKG reveals normal sinus rhythm.   Adrian Prows, MD, [facility] 09/30/2021, 10:48 AM Office: [phone]

## 2021-11-05 DIAGNOSIS — I2510 Atherosclerotic heart disease of native coronary artery without angina pectoris: Secondary | ICD-10-CM | POA: Diagnosis not present

## 2021-11-05 DIAGNOSIS — I252 Old myocardial infarction: Secondary | ICD-10-CM | POA: Diagnosis not present

## 2021-11-05 DIAGNOSIS — I739 Peripheral vascular disease, unspecified: Secondary | ICD-10-CM | POA: Diagnosis not present

## 2021-11-05 DIAGNOSIS — R7303 Prediabetes: Secondary | ICD-10-CM | POA: Diagnosis not present

## 2021-11-05 DIAGNOSIS — Z85118 Personal history of other malignant neoplasm of bronchus and lung: Secondary | ICD-10-CM | POA: Diagnosis not present

## 2021-11-05 DIAGNOSIS — I10 Essential (primary) hypertension: Secondary | ICD-10-CM | POA: Diagnosis not present

## 2021-11-05 DIAGNOSIS — I700 Atherosclerosis of aorta: Secondary | ICD-10-CM | POA: Diagnosis not present

## 2021-11-05 DIAGNOSIS — J9611 Chronic respiratory failure with hypoxia: Secondary | ICD-10-CM | POA: Diagnosis not present

## 2021-11-05 DIAGNOSIS — E782 Mixed hyperlipidemia: Secondary | ICD-10-CM | POA: Diagnosis not present

## 2021-11-05 DIAGNOSIS — I5042 Chronic combined systolic (congestive) and diastolic (congestive) heart failure: Secondary | ICD-10-CM | POA: Diagnosis not present

## 2021-11-05 DIAGNOSIS — N182 Chronic kidney disease, stage 2 (mild): Secondary | ICD-10-CM | POA: Diagnosis not present

## 2021-11-06 ENCOUNTER — Ambulatory Visit: Payer: Medicare Other

## 2021-11-06 DIAGNOSIS — I5042 Chronic combined systolic (congestive) and diastolic (congestive) heart failure: Secondary | ICD-10-CM | POA: Diagnosis not present

## 2021-11-11 DIAGNOSIS — H401131 Primary open-angle glaucoma, bilateral, mild stage: Secondary | ICD-10-CM | POA: Diagnosis not present

## 2021-12-08 ENCOUNTER — Ambulatory Visit: Payer: Medicare Other

## 2021-12-08 DIAGNOSIS — I7141 Pararenal abdominal aortic aneurysm, without rupture: Secondary | ICD-10-CM | POA: Diagnosis not present

## 2021-12-14 DIAGNOSIS — D2262 Melanocytic nevi of left upper limb, including shoulder: Secondary | ICD-10-CM | POA: Diagnosis not present

## 2021-12-14 DIAGNOSIS — D2271 Melanocytic nevi of right lower limb, including hip: Secondary | ICD-10-CM | POA: Diagnosis not present

## 2021-12-14 DIAGNOSIS — L821 Other seborrheic keratosis: Secondary | ICD-10-CM | POA: Diagnosis not present

## 2021-12-14 DIAGNOSIS — X32XXXA Exposure to sunlight, initial encounter: Secondary | ICD-10-CM | POA: Diagnosis not present

## 2021-12-14 DIAGNOSIS — D2272 Melanocytic nevi of left lower limb, including hip: Secondary | ICD-10-CM | POA: Diagnosis not present

## 2021-12-14 DIAGNOSIS — L538 Other specified erythematous conditions: Secondary | ICD-10-CM | POA: Diagnosis not present

## 2021-12-14 DIAGNOSIS — L820 Inflamed seborrheic keratosis: Secondary | ICD-10-CM | POA: Diagnosis not present

## 2021-12-14 DIAGNOSIS — L570 Actinic keratosis: Secondary | ICD-10-CM | POA: Diagnosis not present

## 2021-12-14 DIAGNOSIS — L291 Pruritus scroti: Secondary | ICD-10-CM | POA: Diagnosis not present

## 2021-12-14 DIAGNOSIS — D2261 Melanocytic nevi of right upper limb, including shoulder: Secondary | ICD-10-CM | POA: Diagnosis not present

## 2021-12-14 DIAGNOSIS — D225 Melanocytic nevi of trunk: Secondary | ICD-10-CM | POA: Diagnosis not present

## 2021-12-15 IMAGING — PT NM PET TUM IMG RESTAG (PS) SKULL BASE T - THIGH
1 of 7 series · 2 of 25 positions shown · non-contrast
Comparison: 01/10/2019.

CLINICAL DATA: Subsequent treatment strategy for non-small-cell
lung cancer.

EXAM:
NUCLEAR MEDICINE PET SKULL BASE TO THIGH
TECHNIQUE: 9.7 mCi F-18 FDG was injected intravenously. Full-ring PET imaging
was performed from the skull base to thigh after the radiotracer. CT
data was obtained and used for attenuation correction and anatomic
localization.
Fasting blood glucose: 137 mg/dl

[Series 4: ct sk_thigh 5.0 b31f · axial · 5.0mm · 0.98mm/px · z∈[-1035,-351]mm · 2 of 229 slices shown]
[im 58/229  brain]
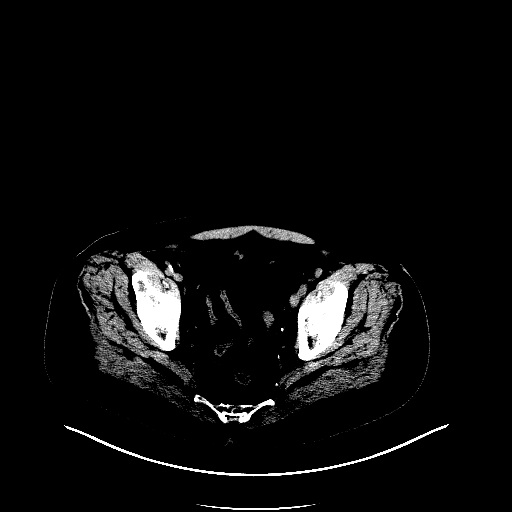
[im 229/229  brain]
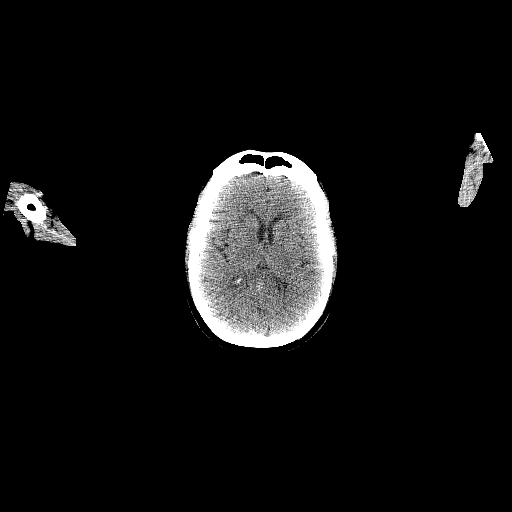

[2 of 25 positions shown; findings below may reference images not displayed]

FINDINGS: Mediastinal blood pool activity: SUV max

Liver activity: SUV max N/A

NECK: Hypermetabolism noted right vocal cord. There is some medial
deviation of the posterior left focal cord. This appearance could be
related to phonation after FDG injection assuming paralysis of the
left cord. This is new in the interval since prior study. Direct
visualization may be warranted to exclude mucosal lesion.

Stable appearance of the previously characterized 13 mm right
thyroid nodule with stable hypermetabolism ( SUV max = 8.8 today
compared to 8.6 previously.

Incidental CT findings:

Tiny right middle lobe pulmonary nodule seen on the previous study
is no longer evident.

CHEST: Interval progression of the hypermetabolism associated with
the nodular soft tissue in the anterior left lung apex. SUV max =
6.6 today compared to 4.0 previously. Qualitatively, the abnormal
soft tissue in this region on CT imaging is similar between the 2
exams.

7 mm right upper lobe nodule (image [DATE]) is new in the interval
with demonstrable hypermetabolism ( SUV max = 6.0).

New focal hypermetabolism is identified in the right hilum with SUV
max = 11.6. Although assessment hindered by lack of intravenous
contrast on CT data for attenuation correction today, activity
likely corresponds to a 9 mm short axis hilar node visible on 73/4.

12 mm subpleural nodule in the paraspinal right lower lobe (62/8) is
stable in size and without hypermetabolism.

7 mm nodule posterior left costophrenic sulcus (56/8) is new in the
interval and without hypermetabolism. This could be atelectatic.

Incidental CT findings: Coronary artery calcification is evident.
Atherosclerotic calcification is noted in the wall of the thoracic
aorta. Enlargement of the pulmonary outflow tract raises the
question of pulmonary arterial hypertension.

ABDOMEN/PELVIS: No abnormal hypermetabolic activity within the
liver, pancreas, adrenal glands, or spleen. No hypermetabolic lymph
nodes in the abdomen or pelvis.

Incidental CT findings: Similar appearance bilateral renal cysts
atherosclerotic disease noted in the abdominal aorta which measures
up to 3.8 cm maximum diameter, not substantially changed from 3.7 cm
when I remeasure in a similar fashion on the prior study. Small
left groin hernia contains only fat.

SKELETON: No focal hypermetabolic activity to suggest skeletal
metastasis.

Incidental CT findings: Sequelae of prior cervical and lumbar
fusion.
IMPRESSION: 1. Interval progression of hypermetabolism associated with the
anteromedial soft tissue in the left lung apex, concerning for
progressive disease.
2. New hypermetabolic parahilar right upper lobe nodule associated
with new focal hypermetabolism in the right hilum, features
concerning for metastatic involvement.
3. New 7 mm nodule posterior left costophrenic sulcus without
hypermetabolism. This may be related to atelectasis. Attention on
follow-up recommended.
4. Asymmetric FDG uptake in the right vocal cord. Focal cord
activity can be seen if a patient is talking after
radiopharmaceutical injection although unilateral uptake would not
be expected unless there is paralysis of one of the vocal cords
(left-side in this particular case). In the absence of vocal cord
paralysis, direct visualization may be warranted to exclude mucosal
lesion on the right.
5. Abdominal aortic aneurysm.  Attention on follow-up recommended.

## 2021-12-24 ENCOUNTER — Telehealth: Payer: Self-pay | Admitting: *Deleted

## 2021-12-24 NOTE — Telephone Encounter (Signed)
Called patient to inform of CT for 01-22-22- arrival time- 10:30 am @ [facility], no restrictions to test, patient to receive results from Dr. Sondra Come on 01-28-22 @ 11:30 am, spoke with patient's wife- Silva Bandy and she is aware of these appts. and the instructions

## 2021-12-24 NOTE — Telephone Encounter (Signed)
RETURNED [name], [name]

## 2022-01-22 ENCOUNTER — Ambulatory Visit (HOSPITAL_COMMUNITY)
Admission: RE | Admit: 2022-01-22 | Discharge: 2022-01-22 | Disposition: A | Discharge status: Home / Self Care | Payer: Medicare Other | Source: Ambulatory Visit | Attending: Radiation Oncology | Admitting: Radiation Oncology

## 2022-01-22 ENCOUNTER — Telehealth: Payer: Self-pay | Admitting: *Deleted

## 2022-01-22 ENCOUNTER — Encounter (HOSPITAL_COMMUNITY): Payer: Self-pay

## 2022-01-22 DIAGNOSIS — C3492 Malignant neoplasm of unspecified part of left bronchus or lung: Secondary | ICD-10-CM | POA: Insufficient documentation

## 2022-01-22 DIAGNOSIS — C3490 Malignant neoplasm of unspecified part of unspecified bronchus or lung: Secondary | ICD-10-CM | POA: Diagnosis not present

## 2022-01-22 DIAGNOSIS — J439 Emphysema, unspecified: Secondary | ICD-10-CM | POA: Diagnosis not present

## 2022-01-22 NOTE — Telephone Encounter (Signed)
CALLED PATIENT TO INFORM OF FU APPT. BEING MOVED TO 8:45 AM ON 01-28-22, PATIENT OPTED TO COME ANOTHER DAY, APPT. SCHEDULED FOR 02-01-22 @ 11 AM, PATIENT AGREED TO NEW DAY AND TIME

## 2022-01-28 ENCOUNTER — Ambulatory Visit: Payer: Self-pay | Admitting: Radiation Oncology

## 2022-01-31 NOTE — Progress Notes (Signed)
Radiation Oncology         ([phone]) [phone] ________________________________  Name: [name] MRN: [id number]  Date: 02/01/2022  DOB: [birth year]  Follow-Up Visit Note  CC: Wenda Low, MD  Wenda Low, MD  No diagnosis found.  Diagnosis: Recurrent non-small cell lung cancer   Interval Since Last Radiation: 2 years, 10 months, and 1 week  Radiation Treatment Dates: 02/13/2019 through 03/28/2019 Site Technique Total Dose (Gy) Dose per Fx (Gy) Completed Fx Beam Energies  Lung, Left: Lung_Lt IMRT 60/60 2 30/30 6X   Narrative:  The patient returns today for routine follow-up and to review recent imaging.   His most recent chest CT on 01/22/22 showed the left apical upper lobe tumor as stable in the interval. The subpleural posteromedial right lower lobe nodule also appears to be stable. CT overall showed no evidence of new disease.   Otherwise, no significant oncologic interval history since the patient was last seen.   ***                           Allergies:  is allergic to entresto [sacubitril-valsartan], iodinated contrast media, penicillins, sulfonamide derivatives, lumigan [bimatoprost], lyrica [pregabalin], symbicort [budesonide-formoterol fumarate], valsartan, advair diskus [fluticasone-salmeterol], azithromycin, brimonidine tartrate, ciprofloxacin, doxycycline, flovent diskus [fluticasone furoate], pulmicort [budesonide], qvar [beclomethasone], spiriva [tiotropium bromide monohydrate], tessalon [benzonatate], bacitracin, flonase [fluticasone propionate], relafen [nabumetone], and tape.  Meds: Current Outpatient Medications  Medication Sig Dispense Refill   albuterol (VENTOLIN HFA) 108 (90 Base) MCG/ACT inhaler Inhale 2 puffs into the lungs every 6 (six) hours as needed for wheezing or shortness of breath.      gabapentin (NEURONTIN) 300 MG capsule Take 600 mg by mouth 3 (three) times daily.      latanoprost (XALATAN) 0.005 % ophthalmic solution Place 1 drop into  both eyes at bedtime.      Maltodextrin-Xanthan Gum (RESOURCE THICKENUP CLEAR) POWD Take by mouth as needed.     metoprolol succinate (TOPROL-XL) 25 MG 24 hr tablet Take 0.5 tablets (12.5 mg total) by mouth every morning. (Patient taking differently: Take 25 mg by mouth every morning.) 60 tablet 0   nitroGLYCERIN (NITROSTAT) 0.4 MG SL tablet Place 0.4 mg under the tongue every 5 (five) minutes as needed for chest pain. For chest pain      OXYGEN Inhale 2 L into the lungs at bedtime.      rosuvastatin (CRESTOR) 5 MG tablet Take 1 tablet (5 mg total) by mouth daily. 90 tablet 3   tamsulosin (FLOMAX) 0.4 MG CAPS capsule Take 0.4 mg by mouth every evening.      timolol (TIMOPTIC) 0.5 % ophthalmic solution Place 1 drop into both eyes 2 (two) times daily.      No current facility-administered medications for this encounter.    Physical Findings: The patient is in no acute distress. Patient is alert and oriented.  vitals were not taken for this visit. .  No significant changes. Lungs are clear to auscultation bilaterally. Heart has regular rate and rhythm. No palpable cervical, supraclavicular, or axillary adenopathy. Abdomen soft, non-tender, normal bowel sounds.   Lab Findings: Lab Results  Component Value Date   WBC 17.7 (H) 01/21/2021   HGB 13.5 01/21/2021   HCT 40.3 01/21/2021   MCV 96.0 01/21/2021   PLT 121 (L) 01/21/2021    Radiographic Findings: CT CHEST WO CONTRAST  Result Date: 01/25/2022 CLINICAL DATA:  Non-small cell lung cancer, assess treatment response. Radiation therapy complete. Cough  and shortness of breath. * Tracking Code: BO * EXAM: CT CHEST WITHOUT CONTRAST TECHNIQUE: Multidetector CT imaging of the chest was performed following the standard protocol without IV contrast. RADIATION DOSE REDUCTION: This exam was performed according to the departmental dose-optimization program which includes automated exposure control, adjustment of the mA and/or kV according to patient  size and/or use of iterative reconstruction technique. COMPARISON:  CT chest 07/17/2021. FINDINGS: Cardiovascular: Atherosclerotic calcification of the aorta, aortic valve and coronary arteries. Enlarged pulmonic trunk. Heart is at the upper limits of normal in size. No pericardial effusion. Mediastinum/Nodes: No pathologically enlarged mediastinal or axillary lymph nodes. Hilar regions are difficult to definitively evaluate without IV contrast. Esophagus is unremarkable. Lungs/Pleura: Centrilobular emphysema. Nodular soft tissue in the apical left upper lobe measures 1.7 x 3.7 cm, stable when measured in a similar fashion. Associated fiducial markers. Scattered pulmonary parenchymal scarring. Scattered mucoid impaction. 3 mm lateral right lower lobe nodule (5/106), stable. Subpleural nodule in the posteromedial right lower lobe measures 0.6 x 1.8 cm (5/118), also stable. No new pulmonary nodules. No pleural fluid. Airway is unremarkable. Upper Abdomen: Visualized portions of the liver, gallbladder and adrenal glands are unremarkable. Low-attenuation lesions in the kidneys measure up to 5.7 cm on the left. No specific follow-up necessary. Tiny right renal stone. Visualized portions of the spleen, pancreas, stomach and bowel are grossly unremarkable. No upper abdominal adenopathy. Musculoskeletal: Degenerative changes in the spine. Old right rib fractures. Old T1, T2 and T4 compression fractures. IMPRESSION: 1. Stable treated tumor in the apical left upper lobe. Subpleural posteromedial right lower lobe nodule, also stable. No evidence of new disease. 2. Tiny right renal stone. 3. Aortic atherosclerosis (ICD10-I70.0). Coronary artery calcification. 4. Enlarged pulmonic trunk, indicative of pulmonary arterial hypertension. 5.  Emphysema (ICD10-J43.9). Electronically Signed   By: Lorin Picket M.D.   On: 01/25/2022 11:47    Impression: Recurrent non-small cell lung cancer   The patient is recovering from the  effects of radiation.  ***  Plan:  ***   *** minutes of total time was spent for this patient encounter, including preparation, face-to-face counseling with the patient and coordination of care, physical exam, and documentation of the encounter. ____________________________________  Blair Promise, PhD, MD  This document serves as a record of services personally performed by Gery Pray, MD. It was created on his behalf by Roney Mans, a trained medical scribe. The creation of this record is based on the scribe's personal observations and the provider's statements to them. This document has been checked and approved by the attending provider.

## 2022-02-01 ENCOUNTER — Encounter: Payer: Self-pay | Admitting: Radiation Oncology

## 2022-02-01 ENCOUNTER — Ambulatory Visit
Admission: RE | Admit: 2022-02-01 | Discharge: 2022-02-01 | Disposition: A | Discharge status: Home / Self Care | Payer: Medicare Other | Source: Ambulatory Visit | Attending: Radiation Oncology | Admitting: Radiation Oncology

## 2022-02-01 VITALS — BP 112/63 | HR 62 | Temp 97.5°F | Resp 24 | Ht 71.0 in | Wt 167.0 lb

## 2022-02-01 DIAGNOSIS — Z923 Personal history of irradiation: Secondary | ICD-10-CM | POA: Insufficient documentation

## 2022-02-01 DIAGNOSIS — I700 Atherosclerosis of aorta: Secondary | ICD-10-CM | POA: Insufficient documentation

## 2022-02-01 DIAGNOSIS — Z85118 Personal history of other malignant neoplasm of bronchus and lung: Secondary | ICD-10-CM | POA: Insufficient documentation

## 2022-02-01 DIAGNOSIS — I517 Cardiomegaly: Secondary | ICD-10-CM | POA: Insufficient documentation

## 2022-02-01 DIAGNOSIS — C3492 Malignant neoplasm of unspecified part of left bronchus or lung: Secondary | ICD-10-CM

## 2022-02-01 DIAGNOSIS — Z79899 Other long term (current) drug therapy: Secondary | ICD-10-CM | POA: Diagnosis not present

## 2022-02-01 DIAGNOSIS — C3412 Malignant neoplasm of upper lobe, left bronchus or lung: Secondary | ICD-10-CM | POA: Diagnosis not present

## 2022-02-01 DIAGNOSIS — Z87891 Personal history of nicotine dependence: Secondary | ICD-10-CM | POA: Diagnosis not present

## 2022-02-01 DIAGNOSIS — J432 Centrilobular emphysema: Secondary | ICD-10-CM | POA: Insufficient documentation

## 2022-02-01 NOTE — Progress Notes (Signed)
Sadrac Zeoli is here today for follow up post radiation to the lung.  Lung Side: Left, patient completed treatment on 03/28/19  Does the patient complain of any of the following: Pain:No Shortness of breath w/wo exertion: Yes, continues on oxygen 2 L via N  Cough: yes, productive cough Hemoptysis: No Pain with swallowing: No Swallowing/choking concerns: Patient only using resource thicken as needed.  Appetite: Good Energy Level:Low  Post radiation skin Changes: No    Additional comments if applicable:   BP 413/64 (BP Location: Left Arm, Patient Position: Sitting, Cuff Size: Normal)   Pulse 62   Temp (!) 97.5 F (36.4 C)   Resp (!) 24   Ht 5\' 11"  (1.803 m)   Wt 167 lb (75.8 kg)   SpO2 96%   PF (!) 2 L/min   BMI 23.29 kg/m

## 2022-02-08 DIAGNOSIS — J069 Acute upper respiratory infection, unspecified: Secondary | ICD-10-CM | POA: Diagnosis not present

## 2022-02-08 DIAGNOSIS — J441 Chronic obstructive pulmonary disease with (acute) exacerbation: Secondary | ICD-10-CM | POA: Diagnosis not present

## 2022-02-08 DIAGNOSIS — J449 Chronic obstructive pulmonary disease, unspecified: Secondary | ICD-10-CM | POA: Diagnosis not present

## 2022-02-15 DIAGNOSIS — Z23 Encounter for immunization: Secondary | ICD-10-CM | POA: Diagnosis not present

## 2022-02-15 DIAGNOSIS — J441 Chronic obstructive pulmonary disease with (acute) exacerbation: Secondary | ICD-10-CM | POA: Diagnosis not present

## 2022-02-15 DIAGNOSIS — J9610 Chronic respiratory failure, unspecified whether with hypoxia or hypercapnia: Secondary | ICD-10-CM | POA: Diagnosis not present

## 2022-03-23 DIAGNOSIS — I5042 Chronic combined systolic (congestive) and diastolic (congestive) heart failure: Secondary | ICD-10-CM | POA: Diagnosis not present

## 2022-03-23 DIAGNOSIS — N182 Chronic kidney disease, stage 2 (mild): Secondary | ICD-10-CM | POA: Diagnosis not present

## 2022-03-23 DIAGNOSIS — I10 Essential (primary) hypertension: Secondary | ICD-10-CM | POA: Diagnosis not present

## 2022-03-23 DIAGNOSIS — K219 Gastro-esophageal reflux disease without esophagitis: Secondary | ICD-10-CM | POA: Diagnosis not present

## 2022-03-23 DIAGNOSIS — J449 Chronic obstructive pulmonary disease, unspecified: Secondary | ICD-10-CM | POA: Diagnosis not present

## 2022-03-23 DIAGNOSIS — E782 Mixed hyperlipidemia: Secondary | ICD-10-CM | POA: Diagnosis not present

## 2022-03-23 DIAGNOSIS — M810 Age-related osteoporosis without current pathological fracture: Secondary | ICD-10-CM | POA: Diagnosis not present

## 2022-03-23 DIAGNOSIS — I2510 Atherosclerotic heart disease of native coronary artery without angina pectoris: Secondary | ICD-10-CM | POA: Diagnosis not present

## 2022-05-12 DIAGNOSIS — J9611 Chronic respiratory failure with hypoxia: Secondary | ICD-10-CM | POA: Diagnosis not present

## 2022-05-12 DIAGNOSIS — I779 Disorder of arteries and arterioles, unspecified: Secondary | ICD-10-CM | POA: Diagnosis not present

## 2022-05-12 DIAGNOSIS — J449 Chronic obstructive pulmonary disease, unspecified: Secondary | ICD-10-CM | POA: Diagnosis not present

## 2022-05-12 DIAGNOSIS — I739 Peripheral vascular disease, unspecified: Secondary | ICD-10-CM | POA: Diagnosis not present

## 2022-05-12 DIAGNOSIS — R7303 Prediabetes: Secondary | ICD-10-CM | POA: Diagnosis not present

## 2022-05-12 DIAGNOSIS — E782 Mixed hyperlipidemia: Secondary | ICD-10-CM | POA: Diagnosis not present

## 2022-05-12 DIAGNOSIS — I10 Essential (primary) hypertension: Secondary | ICD-10-CM | POA: Diagnosis not present

## 2022-05-12 DIAGNOSIS — I5042 Chronic combined systolic (congestive) and diastolic (congestive) heart failure: Secondary | ICD-10-CM | POA: Diagnosis not present

## 2022-05-12 DIAGNOSIS — I714 Abdominal aortic aneurysm, without rupture, unspecified: Secondary | ICD-10-CM | POA: Diagnosis not present

## 2022-05-12 DIAGNOSIS — Z0000 Encounter for general adult medical examination without abnormal findings: Secondary | ICD-10-CM | POA: Diagnosis not present

## 2022-05-12 DIAGNOSIS — Z85118 Personal history of other malignant neoplasm of bronchus and lung: Secondary | ICD-10-CM | POA: Diagnosis not present

## 2022-05-12 DIAGNOSIS — I2510 Atherosclerotic heart disease of native coronary artery without angina pectoris: Secondary | ICD-10-CM | POA: Diagnosis not present

## 2022-07-15 ENCOUNTER — Telehealth: Payer: Self-pay | Admitting: *Deleted

## 2022-07-15 NOTE — Telephone Encounter (Signed)
CALLED PATIENT TO INFORM OF CT FOR 08-04-22- ARRIVAL TIME- 12:45 PM @ [facility], NO RESTRICTIONS TO TEST, PATIENT TO RECEIVE RESULTS FROM DR. KINARD ON 08-05-22 @ 11 AM, LVM FOR A RETURN CALL

## 2022-08-04 ENCOUNTER — Ambulatory Visit (HOSPITAL_COMMUNITY)
Admission: RE | Admit: 2022-08-04 | Discharge: 2022-08-04 | Disposition: A | Discharge status: Home / Self Care | Payer: Medicare Other | Source: Ambulatory Visit | Attending: Radiology | Admitting: Radiology

## 2022-08-04 DIAGNOSIS — C3492 Malignant neoplasm of unspecified part of left bronchus or lung: Secondary | ICD-10-CM | POA: Insufficient documentation

## 2022-08-04 DIAGNOSIS — J432 Centrilobular emphysema: Secondary | ICD-10-CM | POA: Diagnosis not present

## 2022-08-04 DIAGNOSIS — C3490 Malignant neoplasm of unspecified part of unspecified bronchus or lung: Secondary | ICD-10-CM | POA: Diagnosis not present

## 2022-08-04 NOTE — Progress Notes (Signed)
Radiation Oncology         [phone] ________________________________  Name: [name] MRN: [id number]  Date: 08/05/2022  DOB: 22-Nov-1935  Follow-Up Visit Note  CC: Georgann Housekeeper, MD  Georgann Housekeeper, MD  No diagnosis found.  Diagnosis:  Recurrent non-small cell lung cancer   Interval Since Last Radiation: 3 years, 4 months, and 10 days   Radiation Treatment Dates: 02/13/2019 through 03/28/2019 Site Technique Total Dose (Gy) Dose per Fx (Gy) Completed Fx Beam Energies  Lung, Left: Lung_Lt IMRT 60/60 2 30/30 6X   Narrative:  The patient returns today for routine follow-up and to review recent imaging. He was last seen here for follow-up on 02/01/22. His most recent chest CT without contrast performed yesterday (08/04/22) demonstrates: ***.   No other significant interval history since the patient was last seen.   ***                       Allergies:  is allergic to entresto [sacubitril-valsartan], iodinated contrast media, penicillins, sulfonamide derivatives, lumigan [bimatoprost], lyrica [pregabalin], symbicort [budesonide-formoterol fumarate], valsartan, advair diskus [fluticasone-salmeterol], azithromycin, brimonidine tartrate, ciprofloxacin, doxycycline, flovent diskus [fluticasone furoate], pulmicort [budesonide], qvar [beclomethasone], spiriva [tiotropium bromide monohydrate], tessalon [benzonatate], bacitracin, flonase [fluticasone propionate], relafen [nabumetone], and tape.  Meds: Current Outpatient Medications  Medication Sig Dispense Refill   albuterol (VENTOLIN HFA) 108 (90 Base) MCG/ACT inhaler Inhale 2 puffs into the lungs every 6 (six) hours as needed for wheezing or shortness of breath.      gabapentin (NEURONTIN) 300 MG capsule Take 600 mg by mouth 3 (three) times daily.      latanoprost (XALATAN) 0.005 % ophthalmic solution Place 1 drop into both eyes at bedtime.      Maltodextrin-Xanthan Gum (RESOURCE THICKENUP CLEAR) POWD Take by mouth as needed.  (Patient not taking: Reported on 02/01/2022)     metoprolol succinate (TOPROL-XL) 25 MG 24 hr tablet Take 0.5 tablets (12.5 mg total) by mouth every morning. (Patient taking differently: Take 25 mg by mouth every morning.) 60 tablet 0   nitroGLYCERIN (NITROSTAT) 0.4 MG SL tablet Place 0.4 mg under the tongue every 5 (five) minutes as needed for chest pain. For chest pain      OXYGEN Inhale 2 L into the lungs at bedtime.      rosuvastatin (CRESTOR) 5 MG tablet Take 1 tablet (5 mg total) by mouth daily. 90 tablet 3   tamsulosin (FLOMAX) 0.4 MG CAPS capsule Take 0.4 mg by mouth every evening.      timolol (TIMOPTIC) 0.5 % ophthalmic solution Place 1 drop into both eyes 2 (two) times daily.      No current facility-administered medications for this encounter.    Physical Findings: The patient is in no acute distress. Patient is alert and oriented.  vitals were not taken for this visit. .  No significant changes. Lungs are clear to auscultation bilaterally. Heart has regular rate and rhythm. No palpable cervical, supraclavicular, or axillary adenopathy. Abdomen soft, non-tender, normal bowel sounds.   Lab Findings: Lab Results  Component Value Date   WBC 17.7 (H) 01/21/2021   HGB 13.5 01/21/2021   HCT 40.3 01/21/2021   MCV 96.0 01/21/2021   PLT 121 (L) 01/21/2021    Radiographic Findings: No results found.  Impression:  Recurrent non-small cell lung cancer   The patient is recovering from the effects of radiation.  ***  Plan:  ***   *** minutes of total time was spent for this  patient encounter, including preparation, face-to-face counseling with the patient and coordination of care, physical exam, and documentation of the encounter. ____________________________________  Billie Lade, PhD, MD  This document serves as a record of services personally performed by Antony Blackbird, MD. It was created on his behalf by Neena Rhymes, a trained medical scribe. The creation of this record  is based on the scribe's personal observations and the provider's statements to them. This document has been checked and approved by the attending provider.

## 2022-08-05 ENCOUNTER — Encounter: Payer: Self-pay | Admitting: Radiation Oncology

## 2022-08-05 ENCOUNTER — Ambulatory Visit
Admission: RE | Admit: 2022-08-05 | Discharge: 2022-08-05 | Disposition: A | Discharge status: Home / Self Care | Payer: Medicare Other | Source: Ambulatory Visit | Attending: Radiation Oncology | Admitting: Radiation Oncology

## 2022-08-05 VITALS — HR 69 | Temp 98.2°F | Resp 16 | Wt 167.8 lb

## 2022-08-05 DIAGNOSIS — Z923 Personal history of irradiation: Secondary | ICD-10-CM | POA: Diagnosis not present

## 2022-08-05 DIAGNOSIS — Z79899 Other long term (current) drug therapy: Secondary | ICD-10-CM | POA: Diagnosis not present

## 2022-08-05 DIAGNOSIS — I2510 Atherosclerotic heart disease of native coronary artery without angina pectoris: Secondary | ICD-10-CM | POA: Insufficient documentation

## 2022-08-05 DIAGNOSIS — Z85118 Personal history of other malignant neoplasm of bronchus and lung: Secondary | ICD-10-CM | POA: Insufficient documentation

## 2022-08-05 DIAGNOSIS — I700 Atherosclerosis of aorta: Secondary | ICD-10-CM | POA: Insufficient documentation

## 2022-08-05 DIAGNOSIS — Z87891 Personal history of nicotine dependence: Secondary | ICD-10-CM | POA: Diagnosis not present

## 2022-08-05 DIAGNOSIS — C3492 Malignant neoplasm of unspecified part of left bronchus or lung: Secondary | ICD-10-CM

## 2022-08-05 DIAGNOSIS — C3412 Malignant neoplasm of upper lobe, left bronchus or lung: Secondary | ICD-10-CM | POA: Diagnosis not present

## 2022-08-05 DIAGNOSIS — Z9981 Dependence on supplemental oxygen: Secondary | ICD-10-CM | POA: Insufficient documentation

## 2022-08-05 NOTE — Progress Notes (Signed)
[name] is here today for follow up post radiation to the lung.  Lung Side: left, patient completed treatment on 03/28/19  Does the patient complain of any of the following: Pain: Reports muscle discomfort to radiation field at times.  Reports sore throat at time.  Shortness of breath w/wo exertion: Yes, patient continues to wear 02 at 2 liters.  Cough: Yes, productive.  Hemoptysis: No Pain with swallowing: No Swallowing/choking concerns: No Appetite: Good Weight:  Wt Readings from Last 3 Encounters:  08/05/22 167 lb 12.8 oz (76.1 kg)  02/01/22 167 lb (75.8 kg)  09/30/21 166 lb 3.2 oz (75.4 kg)   Energy Level: Good  Post radiation skin Changes: No     Additional comments if applicable:   Pulse 69   Temp 98.2 F (36.8 C)   Resp 16   Wt 167 lb 12.8 oz (76.1 kg)   SpO2 97%   BMI 23.40 kg/m

## 2022-10-01 ENCOUNTER — Ambulatory Visit: Payer: Medicare Other | Admitting: Cardiology

## 2022-10-01 ENCOUNTER — Encounter: Payer: Self-pay | Admitting: Cardiology

## 2022-10-01 VITALS — BP 120/75 | HR 61 | Resp 16 | Ht 71.0 in | Wt 168.0 lb

## 2022-10-01 DIAGNOSIS — J432 Centrilobular emphysema: Secondary | ICD-10-CM

## 2022-10-01 DIAGNOSIS — I25118 Atherosclerotic heart disease of native coronary artery with other forms of angina pectoris: Secondary | ICD-10-CM

## 2022-10-01 DIAGNOSIS — I5042 Chronic combined systolic (congestive) and diastolic (congestive) heart failure: Secondary | ICD-10-CM | POA: Diagnosis not present

## 2022-10-01 NOTE — Progress Notes (Unsigned)
Primary Physician/Referring:  Georgann Housekeeper, MD  Patient ID: [name], male    DOB: 1935-08-28, 87 y.o.   MRN: [id number]  Chief Complaint  Patient presents with   Coronary artery disease of native artery of native heart wi   AAA   Congestive Heart Failure   Follow-up    1 year   HPI:    HPI: [name]  is a 87 y.o. Caucasian male with history of non-STEMI and PCI with DES to RCA in 2011 and circumflex stenting in 2013, ischemic cardiomyopathy with mildly reduced LVEF around 40-45%, hypertension and hyperlipidemia, asymptomatic right carotid stenosis, small 3.3 cm stable AAA since 1980s, severe COPD from prior tobacco use and presently on nocturnal and also uses oxygen when he is exerting. He has history of sleep apnea unable to tolerate CPAP multiple medication allergies, chronic back pain and spinal fusion in the past. History of lung cancer status post radiation therapy and follows Dr. Antony Blackbird since February 2018.  He presents for annual visit, presently doing well and has not had any recent hospitalization for any decompensated heart failure or angina pectoris.  No new symptomatology.  States that his oxygen needs are essentially stable at 2 L/min but does fall down when he does walk.  Past Medical History:  Diagnosis Date   AAA (abdominal aortic aneurysm) (HCC)    measures 2.7 cm.   Arthritis    Back pain    BPH (benign prostatic hypertrophy)    Bruises easily    CAD (coronary artery disease)    Cervical spine fracture (HCC) feb 1990   C 6, C 7 and T 1, no surgery done wore halo brace   CLL (chronic lymphocytic leukemia) (HCC)    hx of worked up 2009 by dr Truett Perna, no tx done, released by dr sherrill   Complication of anesthesia    cannot lie on right side gets vertigo and trouble turning neck to right   COPD (chronic obstructive pulmonary disease) (HCC) may 2005   Erectile dysfunction    Fatigue    GERD (gastroesophageal reflux disease)     Glaucoma    Hepatitis as child   serum hepatitis   History of oxygen administration    oxygen  @2   l/m nasally at bedtime.   History of radiation therapy 06/22/16, 06/24/2016, 06/28/16   Left upper lobe of lung/ 54 Gy in 3 fractions   History of radiation therapy 02/13/2019-03/28/2019   IMRT to left lung    Dr Antony Blackbird   Hyperlipidemia    Hypertension    Impaired hearing    wears Hearing aids   Ischemic cardiomyopathy    Leg pain    Lumbar disc disease    Lung cancer (HCC) dx'd 03/18/16   Microscopic hematuria    Myocardial infarct (HCC) 03-17-2009   Hx MI 2000   Neuropathy    both feet and legs   Osteopenia    Pneumonia    hx x2   Thyroid nodule     Social History   Tobacco Use   Smoking status: Former    Current packs/day: 0.00    Average packs/day: 2.0 packs/day for 55.0 years (110.0 ttl pk-yrs)    Types: Cigarettes    Start date: 07/14/1951    Quit date: 07/14/2006    Years since quitting: 16.2   Smokeless tobacco: Never  Substance Use Topics   Alcohol use: No  Marital Status: Married    ROS  Review of  Systems  Cardiovascular:  Positive for dyspnea on exertion. Negative for chest pain, claudication and leg swelling.  Gastrointestinal:  Negative for melena.   Objective  Blood pressure 120/75, pulse 61, resp. rate 16, height 5\' 11"  (1.803 m), weight 168 lb (76.2 kg), SpO2 95%. Body mass index is 23.43 kg/m.      10/01/2022    1:57 PM 08/05/2022   10:44 AM 02/01/2022   11:04 AM  Vitals with BMI  Height 5\' 11"   5\' 11"   Weight 168 lbs 167 lbs 13 oz 167 lbs  BMI 23.44  23.3  Systolic 120  112  Diastolic 75  63  Pulse 61 69 62    Physical Exam Constitutional:      General: He is not in acute distress. Eyes:     Conjunctiva/sclera: Conjunctivae normal.  Neck:     Thyroid: No thyromegaly.     Vascular: No carotid bruit or JVD.  Cardiovascular:     Rate and Rhythm: Normal rate and regular rhythm.     Pulses:          Femoral pulses are 2+ on the right  side and 2+ on the left side.      Popliteal pulses are 1+ on the right side and 1+ on the left side.       Dorsalis pedis pulses are 0 on the right side and 0 on the left side.       Posterior tibial pulses are 0 on the right side and 0 on the left side.     Heart sounds: Normal heart sounds. No murmur heard.    No gallop.  Pulmonary:     Effort: Pulmonary effort is normal. No respiratory distress.     Breath sounds: Examination of the right-lower field reveals rhonchi. Examination of the left-lower field reveals rhonchi. Rhonchi present.  Abdominal:     General: Bowel sounds are normal.     Palpations: Abdomen is soft.  Musculoskeletal:     Right lower leg: No edema.     Left lower leg: No edema.  Skin:    Capillary Refill: Capillary refill takes less than 2 seconds.    Radiology:  CT chest without contrast 07/17/2021: 1. Unchanged post treatment/post radiation fibrotic consolidation of the subpleural anterior left upper lobe containing a fiducial marker. 2. Unchanged subpleural nodule of the dependent right lower lobe. Attention on follow-up. 3. Emphysema and diffuse bilateral bronchial wall thickening. 4. Coronary artery disease. 5. Enlargement of the main pulmonary artery as can be seen in pulmonary hypertension. 6. Aortic valve calcifications. Correlate for echocardiographic evidence of aortic valve dysfunction.  Laboratory examination:   External labs: Cholesterol, total 93.000 mg 05/12/2022 HDL 39.000 mg 05/12/2022 LDL 33.000 mg 05/12/2022 Triglycerides 119.000 m 05/12/2022  A1C 5.500 % 05/12/2022 TSH 1.790 05/12/2022  Hemoglobin 13.400 g/d 05/12/2022  Creatinine, Serum 0.820 mg/ 05/12/2022 Potassium 4.800 mm 05/12/2022 Magnesium N/D ALT (SGPT) 10.000 U/L 05/12/2022    Labs 05/07/2021:  A1c 6.1%.  TSH 1.00.  Total cholesterol 115, triglycerides 82, HDL 32, LDL 67.  BUN 14, creatinine 0.89, EGFR 85 mill, potassium 4.4, LFTs normal.  Hb 14.0/HCT 41.7,  platelets 114.  Normal indicis.    Medications    Current Outpatient Medications:    albuterol (VENTOLIN HFA) 108 (90 Base) MCG/ACT inhaler, Inhale 2 puffs into the lungs every 6 (six) hours as needed for wheezing or shortness of breath. , Disp: , Rfl:    gabapentin (NEURONTIN) 300 MG capsule, Take 600 mg  by mouth 3 (three) times daily. , Disp: , Rfl:    latanoprost (XALATAN) 0.005 % ophthalmic solution, Place 1 drop into both eyes at bedtime. , Disp: , Rfl:    Maltodextrin-Xanthan Gum (RESOURCE THICKENUP CLEAR) POWD, Take by mouth as needed., Disp: , Rfl:    metoprolol succinate (TOPROL-XL) 25 MG 24 hr tablet, Take 0.5 tablets (12.5 mg total) by mouth every morning. (Patient taking differently: Take 25 mg by mouth every morning.), Disp: 60 tablet, Rfl: 0   nitroGLYCERIN (NITROSTAT) 0.4 MG SL tablet, Place 0.4 mg under the tongue every 5 (five) minutes as needed for chest pain. For chest pain , Disp: , Rfl:    OXYGEN, Inhale 2 L into the lungs at bedtime. , Disp: , Rfl:    tamsulosin (FLOMAX) 0.4 MG CAPS capsule, Take 0.4 mg by mouth every evening. , Disp: , Rfl:    timolol (TIMOPTIC) 0.5 % ophthalmic solution, Place 1 drop into both eyes 2 (two) times daily. , Disp: , Rfl:   Cardiac Studies:   Coronary Angiography  PCI  In 2011 (DES to RCA) and [place] and PCI in 07/2011 (DES to proximal CX and balloon angioplasty to proximal RCA in-stent restenosis),  Abdominal Aortic Duplex  07/18/2015: 3.3 cm distal abdominal aortic aneurysm with aneurysmal dilatation over 6 cm length noted. Similar findings noted on priors study. Recommend followup by [place] in 3 years.   Carotid artery duplex 03/27/2018: Moderate amount of plaque at the carotid bulbs and internal carotid arteries, right side greater than left. Estimated degree of stenosis in the internal carotid arteries is less than 50% bilaterally. Patent vertebral arteries with antegrade flow.  PCV ECHOCARDIOGRAM COMPLETE  11/06/2021  Narrative Echocardiogram 11/06/2021: Mildly depressed LV systolic function with visual EF 40-45%. Left ventricle cavity is normal in size. Moderate concentric hypertrophy of the left ventricle. Hypokinetic global wall motion. Doppler evidence of grade I (impaired) diastolic dysfunction, normal LAP. Calculated EF 40%. Trileaflet aortic valve with no regurgitation. Mild aortic valve leaflet calcification. Structurally normal tricuspid valve with no regurgitation. No evidence of pulmonary hypertension. no significant change compared to prior 09/15/2019     EKG:  EKG 09/30/2021: Normal sinus rhythm with rate of 62 bpm, normal EKG.  No change from 09/29/2020.  Assessment     ICD-10-CM   1. Coronary artery disease of native artery of native heart with stable angina pectoris (HCC)  I25.118     2. Chronic combined systolic and diastolic heart failure (HCC)  W09.81     3. Centrilobular emphysema (HCC)  J43.2     4. Hypercholesteremia  E78.00       Recommendations:   [name]  is a 87 y.o.Caucasian male with history of non-STEMI and PCI with DES to RCA in 2011 and circumflex stenting in 2013, ischemic cardiomyopathy with mildly reduced LVEF around 40-45%, hypertension and hyperlipidemia, asymptomatic right carotid stenosis, small 3.3 cm stable AAA since 1980s, severe COPD from prior tobacco use and presently on nocturnal and also uses oxygen when he is exerting. He has history of sleep apnea unable to tolerate CPAP multiple medication allergies, chronic back pain and spinal fusion in the past. History of lung cancer status post radiation therapy and follows Dr. Antony Blackbird since February 2018.  He presents for annual visit, presently doing well and has not had any recent hospitalization for any decompensated heart failure or angina pectoris.  From cardiac standpoint he has remained stable.  I reviewed his CT scan from recent screening, he  does have pulmonary hypertension however  clinically he has done well without any right-sided heart failure symptoms.  His oxygen needs has not changed.  In view of this we could continue observation for now, his last echocardiogram was in 2021.  It may be worthwhile to repeat echocardiogram to follow-up on pulmonary hypertension if present, it would be WHO group 3.  Small AAA has remained stable, he has not had any recent reevaluation for the same.  I will go ahead and order the aortic duplex as well.  His labs including lipids, CBC and BMP are stable.  He is tolerating Crestor 5 mg daily.  I am happy to have seen him today, overall stable, I will see him back in a year again.  EKG reveals normal sinus rhythm.   Omar Decamp, MD, [facility] 10/01/2022, 2:28 PM Office: [phone]

## 2022-10-05 ENCOUNTER — Encounter: Payer: Self-pay | Admitting: Cardiology

## 2022-11-18 DIAGNOSIS — I2510 Atherosclerotic heart disease of native coronary artery without angina pectoris: Secondary | ICD-10-CM | POA: Diagnosis not present

## 2022-11-18 DIAGNOSIS — I739 Peripheral vascular disease, unspecified: Secondary | ICD-10-CM | POA: Diagnosis not present

## 2022-11-18 DIAGNOSIS — I700 Atherosclerosis of aorta: Secondary | ICD-10-CM | POA: Diagnosis not present

## 2022-11-18 DIAGNOSIS — I110 Hypertensive heart disease with heart failure: Secondary | ICD-10-CM | POA: Diagnosis not present

## 2022-11-18 DIAGNOSIS — J449 Chronic obstructive pulmonary disease, unspecified: Secondary | ICD-10-CM | POA: Diagnosis not present

## 2022-11-18 DIAGNOSIS — I5042 Chronic combined systolic (congestive) and diastolic (congestive) heart failure: Secondary | ICD-10-CM | POA: Diagnosis not present

## 2022-11-18 DIAGNOSIS — M5090 Cervical disc disorder, unspecified, unspecified cervical region: Secondary | ICD-10-CM | POA: Diagnosis not present

## 2022-11-18 DIAGNOSIS — R7303 Prediabetes: Secondary | ICD-10-CM | POA: Diagnosis not present

## 2022-11-18 DIAGNOSIS — J9611 Chronic respiratory failure with hypoxia: Secondary | ICD-10-CM | POA: Diagnosis not present

## 2022-12-01 DIAGNOSIS — H401133 Primary open-angle glaucoma, bilateral, severe stage: Secondary | ICD-10-CM | POA: Diagnosis not present

## 2022-12-06 DIAGNOSIS — I2510 Atherosclerotic heart disease of native coronary artery without angina pectoris: Secondary | ICD-10-CM | POA: Diagnosis not present

## 2022-12-06 DIAGNOSIS — E782 Mixed hyperlipidemia: Secondary | ICD-10-CM | POA: Diagnosis not present

## 2022-12-06 DIAGNOSIS — I10 Essential (primary) hypertension: Secondary | ICD-10-CM | POA: Diagnosis not present

## 2022-12-06 DIAGNOSIS — I5042 Chronic combined systolic (congestive) and diastolic (congestive) heart failure: Secondary | ICD-10-CM | POA: Diagnosis not present

## 2022-12-16 DIAGNOSIS — L821 Other seborrheic keratosis: Secondary | ICD-10-CM | POA: Diagnosis not present

## 2022-12-16 DIAGNOSIS — D2261 Melanocytic nevi of right upper limb, including shoulder: Secondary | ICD-10-CM | POA: Diagnosis not present

## 2022-12-16 DIAGNOSIS — D2262 Melanocytic nevi of left upper limb, including shoulder: Secondary | ICD-10-CM | POA: Diagnosis not present

## 2022-12-16 DIAGNOSIS — L570 Actinic keratosis: Secondary | ICD-10-CM | POA: Diagnosis not present

## 2022-12-16 DIAGNOSIS — D225 Melanocytic nevi of trunk: Secondary | ICD-10-CM | POA: Diagnosis not present

## 2022-12-16 DIAGNOSIS — L291 Pruritus scroti: Secondary | ICD-10-CM | POA: Diagnosis not present

## 2022-12-16 DIAGNOSIS — D2271 Melanocytic nevi of right lower limb, including hip: Secondary | ICD-10-CM | POA: Diagnosis not present

## 2022-12-16 DIAGNOSIS — D2272 Melanocytic nevi of left lower limb, including hip: Secondary | ICD-10-CM | POA: Diagnosis not present

## 2023-01-04 DIAGNOSIS — Z23 Encounter for immunization: Secondary | ICD-10-CM | POA: Diagnosis not present

## 2023-01-24 ENCOUNTER — Telehealth: Payer: Self-pay | Admitting: *Deleted

## 2023-01-24 NOTE — Telephone Encounter (Signed)
CALLED PATIENT TO INFORM OF CT FOR 02-04-23- ARRIVAL TIME- 1:15 PM @ [facility], NO RESTRICTIONS TO SCAN, PATIENT TO RECEIVE RESULTS FROM DR. KINARD ON 02-07-23 @ 10:30 AM, SPOKE WITH PATIENT AND HE IS AWARE OF THESE APPTS. AND THE INSTRUCTIONS

## 2023-02-04 ENCOUNTER — Encounter (HOSPITAL_COMMUNITY): Payer: Self-pay

## 2023-02-04 ENCOUNTER — Ambulatory Visit (HOSPITAL_COMMUNITY)
Admission: RE | Admit: 2023-02-04 | Discharge: 2023-02-04 | Disposition: A | Discharge status: Home / Self Care | Payer: Medicare Other | Source: Ambulatory Visit | Attending: Radiology

## 2023-02-04 DIAGNOSIS — J9611 Chronic respiratory failure with hypoxia: Secondary | ICD-10-CM | POA: Diagnosis not present

## 2023-02-04 DIAGNOSIS — J432 Centrilobular emphysema: Secondary | ICD-10-CM | POA: Diagnosis not present

## 2023-02-04 DIAGNOSIS — C3490 Malignant neoplasm of unspecified part of unspecified bronchus or lung: Secondary | ICD-10-CM | POA: Diagnosis not present

## 2023-02-04 DIAGNOSIS — I700 Atherosclerosis of aorta: Secondary | ICD-10-CM | POA: Diagnosis not present

## 2023-02-04 DIAGNOSIS — C3492 Malignant neoplasm of unspecified part of left bronchus or lung: Secondary | ICD-10-CM | POA: Insufficient documentation

## 2023-02-04 DIAGNOSIS — J441 Chronic obstructive pulmonary disease with (acute) exacerbation: Secondary | ICD-10-CM | POA: Diagnosis not present

## 2023-02-04 DIAGNOSIS — J40 Bronchitis, not specified as acute or chronic: Secondary | ICD-10-CM | POA: Diagnosis not present

## 2023-02-05 NOTE — Progress Notes (Signed)
Radiation Oncology         [phone] ________________________________  Name: [name] MRN: [id number]  Date: 02/07/2023  DOB: 1935/11/20  Follow-Up Visit Note  CC: Georgann Housekeeper, MD  Georgann Housekeeper, MD  No diagnosis found.  Diagnosis: Recurrent non-small cell lung cancer   Interval Since Last Radiation: 3 years, 10 months, and 12 days   Radiation Treatment Dates: 02/13/2019 through 03/28/2019 Site Technique Total Dose (Gy) Dose per Fx (Gy) Completed Fx Beam Energies  Lung, Left: Lung_Lt IMRT 60/60 2 30/30 6X   Narrative:  The patient returns today for routine follow-up and to review recent imaging. He was last seen here for follow-up on 08/05/22.   His most recent chest CT on 02/04/23 demonstrates: ***  No other significant oncologic interval history since he was last seen for follow-up.   ***                               Allergies:  is allergic to entresto [sacubitril-valsartan], iodinated contrast media, penicillins, sulfonamide derivatives, lumigan [bimatoprost], lyrica [pregabalin], symbicort [budesonide-formoterol fumarate], valsartan, advair diskus [fluticasone-salmeterol], azithromycin, brimonidine tartrate, ciprofloxacin, doxycycline, flovent diskus [fluticasone furoate], pulmicort [budesonide], qvar [beclomethasone], spiriva [tiotropium bromide monohydrate], tessalon [benzonatate], bacitracin, flonase [fluticasone propionate], relafen [nabumetone], and tape.  Meds: Current Outpatient Medications  Medication Sig Dispense Refill   albuterol (VENTOLIN HFA) 108 (90 Base) MCG/ACT inhaler Inhale 2 puffs into the lungs every 6 (six) hours as needed for wheezing or shortness of breath.      gabapentin (NEURONTIN) 300 MG capsule Take 600 mg by mouth 3 (three) times daily.      latanoprost (XALATAN) 0.005 % ophthalmic solution Place 1 drop into both eyes at bedtime.      Maltodextrin-Xanthan Gum (RESOURCE THICKENUP CLEAR) POWD Take by mouth as needed.      metoprolol succinate (TOPROL-XL) 25 MG 24 hr tablet Take 0.5 tablets (12.5 mg total) by mouth every morning. (Patient taking differently: Take 25 mg by mouth every morning.) 60 tablet 0   nitroGLYCERIN (NITROSTAT) 0.4 MG SL tablet Place 0.4 mg under the tongue every 5 (five) minutes as needed for chest pain. For chest pain      OXYGEN Inhale 2 L into the lungs at bedtime.      tamsulosin (FLOMAX) 0.4 MG CAPS capsule Take 0.4 mg by mouth every evening.      timolol (TIMOPTIC) 0.5 % ophthalmic solution Place 1 drop into both eyes 2 (two) times daily.      No current facility-administered medications for this encounter.    Physical Findings: The patient is in no acute distress. Patient is alert and oriented.  vitals were not taken for this visit. .  No significant changes. Lungs are clear to auscultation bilaterally. Heart has regular rate and rhythm. No palpable cervical, supraclavicular, or axillary adenopathy. Abdomen soft, non-tender, normal bowel sounds.   Lab Findings: Lab Results  Component Value Date   WBC 17.7 (H) 01/21/2021   HGB 13.5 01/21/2021   HCT 40.3 01/21/2021   MCV 96.0 01/21/2021   PLT 121 (L) 01/21/2021    Radiographic Findings: No results found.  Impression: Recurrent non-small cell lung cancer   The patient is recovering from the effects of radiation.  ***  Plan:  ***   *** minutes of total time was spent for this patient encounter, including preparation, face-to-face counseling with the patient and coordination of care, physical exam, and documentation of the  encounter. ____________________________________  Billie Lade, PhD, MD  This document serves as a record of services personally performed by Antony Blackbird, MD. It was created on his behalf by Neena Rhymes, a trained medical scribe. The creation of this record is based on the scribe's personal observations and the provider's statements to them. This document has been checked and approved by the  attending provider.

## 2023-02-07 ENCOUNTER — Encounter: Payer: Self-pay | Admitting: Radiation Oncology

## 2023-02-07 ENCOUNTER — Ambulatory Visit
Admission: RE | Admit: 2023-02-07 | Discharge: 2023-02-07 | Disposition: A | Discharge status: Home / Self Care | Payer: Medicare Other | Source: Ambulatory Visit | Attending: Radiation Oncology | Admitting: Radiation Oncology

## 2023-02-07 VITALS — BP 129/74 | HR 59 | Temp 97.3°F | Resp 18 | Wt 165.4 lb

## 2023-02-07 DIAGNOSIS — Z85118 Personal history of other malignant neoplasm of bronchus and lung: Secondary | ICD-10-CM | POA: Diagnosis not present

## 2023-02-07 DIAGNOSIS — C3492 Malignant neoplasm of unspecified part of left bronchus or lung: Secondary | ICD-10-CM

## 2023-02-07 DIAGNOSIS — I272 Pulmonary hypertension, unspecified: Secondary | ICD-10-CM | POA: Insufficient documentation

## 2023-02-07 DIAGNOSIS — Z923 Personal history of irradiation: Secondary | ICD-10-CM | POA: Diagnosis not present

## 2023-02-07 DIAGNOSIS — J432 Centrilobular emphysema: Secondary | ICD-10-CM | POA: Insufficient documentation

## 2023-02-07 DIAGNOSIS — R161 Splenomegaly, not elsewhere classified: Secondary | ICD-10-CM | POA: Insufficient documentation

## 2023-02-07 DIAGNOSIS — I517 Cardiomegaly: Secondary | ICD-10-CM | POA: Diagnosis not present

## 2023-02-07 DIAGNOSIS — C3412 Malignant neoplasm of upper lobe, left bronchus or lung: Secondary | ICD-10-CM | POA: Diagnosis not present

## 2023-02-07 DIAGNOSIS — Z79899 Other long term (current) drug therapy: Secondary | ICD-10-CM | POA: Diagnosis not present

## 2023-02-07 DIAGNOSIS — I2510 Atherosclerotic heart disease of native coronary artery without angina pectoris: Secondary | ICD-10-CM | POA: Insufficient documentation

## 2023-02-07 DIAGNOSIS — I700 Atherosclerosis of aorta: Secondary | ICD-10-CM | POA: Diagnosis not present

## 2023-02-07 DIAGNOSIS — Z87891 Personal history of nicotine dependence: Secondary | ICD-10-CM | POA: Diagnosis not present

## 2023-02-07 NOTE — Progress Notes (Signed)
[name] is here today for follow up post radiation to the lung.  Lung Side: Left  Does the patient complain of any of the following: Pain:No Shortness of breath w/wo exertion: He reports he is normally short of breath. He on 2liters of O2.  Cough: Yes, he reports occasional coughing. Hemoptysis: No Pain with swallowing: No Swallowing/choking concerns: No Appetite: Fair Energy Level: Good Post radiation skin Changes: No  BP 129/74   Pulse (!) 59   Temp (!) 97.3 F (36.3 C)   Resp 18   Wt 165 lb 6.4 oz (75 kg)   SpO2 100%   BMI 23.07 kg/m      Additional comments if applicable:

## 2023-04-29 DIAGNOSIS — J069 Acute upper respiratory infection, unspecified: Secondary | ICD-10-CM | POA: Diagnosis not present

## 2023-05-12 DIAGNOSIS — L728 Other follicular cysts of the skin and subcutaneous tissue: Secondary | ICD-10-CM | POA: Diagnosis not present

## 2023-05-18 DIAGNOSIS — Z0000 Encounter for general adult medical examination without abnormal findings: Secondary | ICD-10-CM | POA: Diagnosis not present

## 2023-05-18 DIAGNOSIS — E782 Mixed hyperlipidemia: Secondary | ICD-10-CM | POA: Diagnosis not present

## 2023-05-18 DIAGNOSIS — I739 Peripheral vascular disease, unspecified: Secondary | ICD-10-CM | POA: Diagnosis not present

## 2023-05-18 DIAGNOSIS — J449 Chronic obstructive pulmonary disease, unspecified: Secondary | ICD-10-CM | POA: Diagnosis not present

## 2023-05-18 DIAGNOSIS — Z85118 Personal history of other malignant neoplasm of bronchus and lung: Secondary | ICD-10-CM | POA: Diagnosis not present

## 2023-05-18 DIAGNOSIS — I10 Essential (primary) hypertension: Secondary | ICD-10-CM | POA: Diagnosis not present

## 2023-05-18 DIAGNOSIS — I700 Atherosclerosis of aorta: Secondary | ICD-10-CM | POA: Diagnosis not present

## 2023-05-18 DIAGNOSIS — N182 Chronic kidney disease, stage 2 (mild): Secondary | ICD-10-CM | POA: Diagnosis not present

## 2023-05-18 DIAGNOSIS — Z23 Encounter for immunization: Secondary | ICD-10-CM | POA: Diagnosis not present

## 2023-05-18 DIAGNOSIS — R7303 Prediabetes: Secondary | ICD-10-CM | POA: Diagnosis not present

## 2023-05-18 DIAGNOSIS — J9611 Chronic respiratory failure with hypoxia: Secondary | ICD-10-CM | POA: Diagnosis not present

## 2023-05-18 DIAGNOSIS — I5042 Chronic combined systolic (congestive) and diastolic (congestive) heart failure: Secondary | ICD-10-CM | POA: Diagnosis not present

## 2023-05-30 DIAGNOSIS — H401133 Primary open-angle glaucoma, bilateral, severe stage: Secondary | ICD-10-CM | POA: Diagnosis not present

## 2023-07-12 DIAGNOSIS — S91109A Unspecified open wound of unspecified toe(s) without damage to nail, initial encounter: Secondary | ICD-10-CM | POA: Diagnosis not present

## 2023-07-12 DIAGNOSIS — L03031 Cellulitis of right toe: Secondary | ICD-10-CM | POA: Diagnosis not present

## 2023-07-14 DIAGNOSIS — L03031 Cellulitis of right toe: Secondary | ICD-10-CM | POA: Diagnosis not present

## 2023-07-14 DIAGNOSIS — G5793 Unspecified mononeuropathy of bilateral lower limbs: Secondary | ICD-10-CM | POA: Diagnosis not present

## 2023-07-14 DIAGNOSIS — I739 Peripheral vascular disease, unspecified: Secondary | ICD-10-CM | POA: Diagnosis not present

## 2023-07-21 DIAGNOSIS — M2041 Other hammer toe(s) (acquired), right foot: Secondary | ICD-10-CM | POA: Diagnosis not present

## 2023-07-21 DIAGNOSIS — L03031 Cellulitis of right toe: Secondary | ICD-10-CM | POA: Diagnosis not present

## 2023-07-21 DIAGNOSIS — I739 Peripheral vascular disease, unspecified: Secondary | ICD-10-CM | POA: Diagnosis not present

## 2023-07-21 DIAGNOSIS — L97511 Non-pressure chronic ulcer of other part of right foot limited to breakdown of skin: Secondary | ICD-10-CM | POA: Diagnosis not present

## 2023-07-28 ENCOUNTER — Telehealth: Payer: Self-pay | Admitting: *Deleted

## 2023-07-28 NOTE — Telephone Encounter (Signed)
 CALLED PATIENT TO INFORM OF CT FOR 08-05-23- ARRIVAL TIME- 2:15 PM @ [facility] NO RESTRICTIONS TO SCAN, PATIENT TO RECEIVE RESULTS FROM DR. KINARD ON  08-11-23 @ 10 :15 AM, SPOKE WITH PATIENT AND HE IS AWARE OF THESE APPTS. AND THE INSTRUCTIONS

## 2023-08-03 ENCOUNTER — Telehealth: Payer: Self-pay | Admitting: *Deleted

## 2023-08-03 NOTE — Telephone Encounter (Signed)
 CALLED PATIENT'S WIFE (PHYLLIS) TO INFORM THAT FU APPT. ON 08/11/23 WILL BE  WITH PA ELLIE MUIR, DUE TO DR. KINARD BEING OFF, LVM FOR A RETURN CALL

## 2023-08-05 ENCOUNTER — Ambulatory Visit (HOSPITAL_COMMUNITY)
Admission: RE | Admit: 2023-08-05 | Discharge: 2023-08-05 | Disposition: A | Discharge status: Home / Self Care | Source: Ambulatory Visit | Attending: Radiation Oncology | Admitting: Radiation Oncology

## 2023-08-05 DIAGNOSIS — J439 Emphysema, unspecified: Secondary | ICD-10-CM | POA: Diagnosis not present

## 2023-08-05 DIAGNOSIS — S2220XD Unspecified fracture of sternum, subsequent encounter for fracture with routine healing: Secondary | ICD-10-CM | POA: Diagnosis not present

## 2023-08-05 DIAGNOSIS — C3492 Malignant neoplasm of unspecified part of left bronchus or lung: Secondary | ICD-10-CM | POA: Diagnosis not present

## 2023-08-05 DIAGNOSIS — C3490 Malignant neoplasm of unspecified part of unspecified bronchus or lung: Secondary | ICD-10-CM | POA: Diagnosis not present

## 2023-08-10 NOTE — Progress Notes (Signed)
 Radiation Oncology         [phone] ________________________________  Name: [name] MRN: [id number]  Date: 08/11/2023  DOB: 1935/04/12  Follow-Up Visit Note  CC: Jearldine Mina, MD  Jearldine Mina, MD  No diagnosis found.  Diagnosis: Recurrent non-small cell lung cancer    Interval Since Last Radiation: 4 years, 4 months, and 18 days    Radiation Treatment Dates: 02/13/2019 through 03/28/2019 Site Technique Total Dose (Gy) Dose per Fx (Gy) Completed Fx Beam Energies  Lung, Left: Lung_Lt IMRT 60/60 2 30/30 6X     Narrative:  The patient returns today for routine follow-up and to review most recent imaging. She was last seen in office on 02/07/23 for a routine follow up. Patient continued to follow up with their specialists to manage their chronic conditions.   Most recent CT chest preformed on 08/05/23 showed interval development of sternal fracture as well as fracture of the T8 vertebral body with superimposed sclerotic changes. These imaging features are worrisome for pathologic fractures. Recommend correlation to radiation beam trajectory as well. Scan also noted small increase in subpleural nodule in the medial right lower lobe now measuring 1.9 cm.  No other significant oncologic interval history since the patient was last seen.                                 Allergies:  is allergic to entresto  [sacubitril -valsartan ], iodinated contrast media, penicillins, sulfonamide derivatives, lumigan  [bimatoprost ], lyrica [pregabalin], symbicort [budesonide-formoterol fumarate], valsartan , advair  diskus [fluticasone -salmeterol], azithromycin , brimonidine tartrate, ciprofloxacin, doxycycline, flovent diskus [fluticasone  furoate], pulmicort [budesonide], qvar [beclomethasone], spiriva [tiotropium bromide monohydrate], tessalon [benzonatate], bacitracin, flonase [fluticasone  propionate], relafen [nabumetone], and tape.  Meds: Current Outpatient Medications  Medication Sig Dispense  Refill   albuterol  (VENTOLIN  HFA) 108 (90 Base) MCG/ACT inhaler Inhale 2 puffs into the lungs every 6 (six) hours as needed for wheezing or shortness of breath.      gabapentin  (NEURONTIN ) 300 MG capsule Take 600 mg by mouth 3 (three) times daily.      ipratropium (ATROVENT) 0.03 % nasal spray Place 1 spray into both nostrils 2 (two) times daily.     latanoprost  (XALATAN ) 0.005 % ophthalmic solution Place 1 drop into both eyes at bedtime.      levofloxacin (LEVAQUIN) 500 MG tablet Take 500 mg by mouth daily.     Maltodextrin-Xanthan Gum (RESOURCE THICKENUP CLEAR) POWD Take by mouth as needed. (Patient not taking: Reported on 02/07/2023)     metoprolol  succinate (TOPROL -XL) 25 MG 24 hr tablet Take 0.5 tablets (12.5 mg total) by mouth every morning. (Patient taking differently: Take 25 mg by mouth every morning.) 60 tablet 0   nitroGLYCERIN  (NITROSTAT ) 0.4 MG SL tablet Place 0.4 mg under the tongue every 5 (five) minutes as needed for chest pain. For chest pain      OXYGEN Inhale 2 L into the lungs at bedtime.      tamsulosin  (FLOMAX ) 0.4 MG CAPS capsule Take 0.4 mg by mouth every evening.      timolol  (TIMOPTIC ) 0.5 % ophthalmic solution Place 1 drop into both eyes 2 (two) times daily.      No current facility-administered medications for this encounter.    Physical Findings: The patient is in no acute distress. Patient is alert and oriented.  vitals were not taken for this visit. .  No significant changes. Lungs are clear to auscultation bilaterally. Heart has regular rate and rhythm.  No palpable cervical, supraclavicular, or axillary adenopathy. Abdomen soft, non-tender, normal bowel sounds.   Lab Findings: Lab Results  Component Value Date   WBC 17.7 (H) 01/21/2021   HGB 13.5 01/21/2021   HCT 40.3 01/21/2021   MCV 96.0 01/21/2021   PLT 121 (L) 01/21/2021    Radiographic Findings: CT CHEST WO CONTRAST Result Date: 08/08/2023 CLINICAL DATA:  Non-small cell lung cancer EXAM: CT CHEST  WITHOUT CONTRAST TECHNIQUE: Multidetector CT imaging of the chest was performed following the standard protocol without IV contrast. RADIATION DOSE REDUCTION: This exam was performed according to the departmental dose-optimization program which includes automated exposure control, adjustment of the mA and/or kV according to patient size and/or use of iterative reconstruction technique. COMPARISON:  CT of the chest performed February 04, 2023 FINDINGS: Cardiovascular: Normal size heart. Calcific atherosclerosis of the right and left coronary territories. No thoracic aortic aneurysm. Mild atherosclerotic changes in the thoracic aorta. Mediastinum/Nodes: Small subcentimeter lymph nodes are present within the mediastinum. Lungs/Pleura: A radiopaque fiducial marker is present within a focus of subpleural nodular consolidation in the left upper lobe on image 28 of CT series 2, not significantly changed. Subpleural nodular consolidation in the posteromedial right lower lobe measures 1.9 x 1.0 cm, slightly increased from 1.6 x 0.8 cm, image 110 of series 2. Emphysema and bibasilar scarring. No pneumothorax. No significant pleural effusion. Upper Abdomen: Cystic changes are present in both kidneys. Gallstones. Musculoskeletal: Degenerative changes in the imaged osseous structures. Interval development of sternal fracture with incomplete healing and heterogeneous bony mineralization. Interval development of sclerosis of the T8 vertebral body with associated vertebral body height loss. IMPRESSION: 1. Interval development of sternal fracture as well as fracture of the T8 vertebral body with superimposed sclerotic changes. These imaging features are worrisome for pathologic fractures. Recommend correlation to radiation beam trajectory as well. 2. Small increase in subpleural nodule in the medial right lower lobe now measuring 1.9 cm. 3. Stable appearance of presumed post treatment changes in the left upper lobe. Electronically  Signed   By: Reagan Camera M.D.   On: 08/08/2023 13:38    Impression:  Recurrent non-small cell lung cancer   The patient is recovering from the effects of radiation.  ***  Plan:  ***   *** minutes of total time was spent for this patient encounter, including preparation, face-to-face counseling with the patient and coordination of care, physical exam, and documentation of the encounter. ____________________________________  Noralee Beam, PhD, MD  This document serves as a record of services personally performed by Retta Caster, MD. It was created on his behalf by Lucky Sable, a trained medical scribe. The creation of this record is based on the scribe's personal observations and the provider's statements to them. This document has been checked and approved by the attending provider.

## 2023-08-11 ENCOUNTER — Encounter: Payer: Self-pay | Admitting: Radiation Oncology

## 2023-08-11 ENCOUNTER — Ambulatory Visit
Admission: RE | Admit: 2023-08-11 | Discharge: 2023-08-11 | Disposition: A | Discharge status: Home / Self Care | Payer: Self-pay | Source: Ambulatory Visit | Attending: Radiology | Admitting: Radiology

## 2023-08-11 VITALS — BP 130/69 | HR 71 | Temp 97.6°F | Resp 20 | Ht 71.0 in | Wt 157.1 lb

## 2023-08-11 DIAGNOSIS — Z87891 Personal history of nicotine dependence: Secondary | ICD-10-CM | POA: Diagnosis not present

## 2023-08-11 DIAGNOSIS — C3412 Malignant neoplasm of upper lobe, left bronchus or lung: Secondary | ICD-10-CM | POA: Diagnosis not present

## 2023-08-11 DIAGNOSIS — C3492 Malignant neoplasm of unspecified part of left bronchus or lung: Secondary | ICD-10-CM

## 2023-08-11 NOTE — Progress Notes (Signed)
 Zacharia Sowles is here today for follow up post radiation to the lung.  Lung Side: Left Side, he completed radiation on 03/28/19.  Does the patient complain of any of the following: Pain:Denies Shortness of breath w/wo exertion: Patient is on continuous 02 at 2liters. Cough: He reports coughing occasionally. He reports after eating he has to cough because his vocal cords were affected from the radiation. Hemoptysis: Denies Pain with swallowing: Denies Swallowing/choking concerns: Denies Appetite: Fair Energy Level: Poor Post radiation skin Changes: Denies      BP 130/69 (BP Location: Left Arm)   Pulse 71   Temp 97.6 F (36.4 C) (Oral)   Resp 20   Ht 5\' 11"  (1.803 m)   Wt 157 lb 1.6 oz (71.3 kg)   SpO2 98%   BMI 21.91 kg/m

## 2023-08-16 DIAGNOSIS — L97511 Non-pressure chronic ulcer of other part of right foot limited to breakdown of skin: Secondary | ICD-10-CM | POA: Diagnosis not present

## 2023-08-16 DIAGNOSIS — I739 Peripheral vascular disease, unspecified: Secondary | ICD-10-CM | POA: Diagnosis not present

## 2023-08-16 DIAGNOSIS — M2041 Other hammer toe(s) (acquired), right foot: Secondary | ICD-10-CM | POA: Diagnosis not present

## 2023-09-06 DIAGNOSIS — L97511 Non-pressure chronic ulcer of other part of right foot limited to breakdown of skin: Secondary | ICD-10-CM | POA: Diagnosis not present

## 2023-09-06 DIAGNOSIS — I739 Peripheral vascular disease, unspecified: Secondary | ICD-10-CM | POA: Diagnosis not present

## 2023-09-06 DIAGNOSIS — M2041 Other hammer toe(s) (acquired), right foot: Secondary | ICD-10-CM | POA: Diagnosis not present

## 2023-10-03 ENCOUNTER — Ambulatory Visit: Payer: Medicare Other | Admitting: Cardiology

## 2023-10-13 DIAGNOSIS — I739 Peripheral vascular disease, unspecified: Secondary | ICD-10-CM | POA: Diagnosis not present

## 2023-10-13 DIAGNOSIS — L97511 Non-pressure chronic ulcer of other part of right foot limited to breakdown of skin: Secondary | ICD-10-CM | POA: Diagnosis not present

## 2023-10-13 DIAGNOSIS — M2041 Other hammer toe(s) (acquired), right foot: Secondary | ICD-10-CM | POA: Diagnosis not present

## 2023-11-03 ENCOUNTER — Telehealth: Payer: Self-pay | Admitting: *Deleted

## 2023-11-03 NOTE — Telephone Encounter (Signed)
 CALLED PATIENT TO  INFORM OF CT FOR 11/11/23- ARRIVAL TIME- 12 PM @ [facility], NO RESTRICTIONS TO SCAN, PATIENT TO RECEIVE RESULTS FROM DR. KINARD ON 11-17-23 @ 11:45 AM, LVM FOR A RETURN CALL

## 2023-11-11 ENCOUNTER — Ambulatory Visit (HOSPITAL_COMMUNITY)
Admission: RE | Admit: 2023-11-11 | Discharge: 2023-11-11 | Disposition: A | Discharge status: Home / Self Care | Source: Ambulatory Visit | Attending: Radiology | Admitting: Radiology

## 2023-11-11 DIAGNOSIS — C3492 Malignant neoplasm of unspecified part of left bronchus or lung: Secondary | ICD-10-CM | POA: Insufficient documentation

## 2023-11-11 DIAGNOSIS — J439 Emphysema, unspecified: Secondary | ICD-10-CM | POA: Diagnosis not present

## 2023-11-11 DIAGNOSIS — C3490 Malignant neoplasm of unspecified part of unspecified bronchus or lung: Secondary | ICD-10-CM | POA: Diagnosis not present

## 2023-11-11 DIAGNOSIS — I700 Atherosclerosis of aorta: Secondary | ICD-10-CM | POA: Diagnosis not present

## 2023-11-16 NOTE — Progress Notes (Signed)
 Radiation Oncology         [phone] ________________________________  Name: [name] MRN: [id number]  Date: 11/17/2023  DOB: [name]  Follow-Up Visit Note  CC: Omar Other, MD  Omar Other, MD  No diagnosis found.  Diagnosis:  Recurrent non-small cell lung cancer; s/p IMRT completed on 03/28/2019   Interval Since Last Radiation: 4 years, 7 months, and 22 days    Radiation Treatment Dates: 02/13/2019 through 03/28/2019 Site Technique Total Dose (Gy) Dose per Fx (Gy) Completed Fx Beam Energies  Lung, Left: Lung_Lt IMRT 60/60 2 30/30 6X      Narrative:  The patient returns today for routine follow-up and to review most recent imaging. He was last seen in office on 08/11/23 for a routine follow up. Patient continued to follow up with their specialists to manage their chronic conditions.    Most recent CT chest preformed on 11/11/23 showed unchanged subpleural nodule of the medial right lung base measuring 1.6 x 0.8 cm along with stable post treatment/post radiation appearance of the chest, with dense consolidation of the anterior paramedian left upper lobe with internal marking clips. Scan did indicate new acute appearing, nondisplaced and minimally displaced fractures of the lateral right sixth through ninth ribs.   No [name] significant oncologic interval history since the patient was last seen.   Allergies:  is allergic to entresto  [sacubitril -valsartan ], iodinated contrast media, penicillins, sulfonamide derivatives, lumigan  [bimatoprost ], lyrica [pregabalin], symbicort [budesonide-formoterol fumarate], valsartan , advair  diskus [fluticasone -salmeterol], azithromycin , brimonidine tartrate, ciprofloxacin, doxycycline, flovent diskus [fluticasone  furoate], pulmicort [budesonide], qvar [beclomethasone], spiriva [tiotropium bromide monohydrate], tessalon [benzonatate], bacitracin, flonase [fluticasone  propionate], relafen [nabumetone], and tape.  Meds: Current Outpatient  Medications  Medication Sig Dispense Refill   albuterol  (VENTOLIN  HFA) 108 (90 Base) MCG/ACT inhaler Inhale 2 puffs into the lungs every 6 (six) hours as needed for wheezing or shortness of breath.      gabapentin  (NEURONTIN ) 300 MG capsule Take 600 mg by mouth 3 (three) times daily.      ipratropium (ATROVENT) 0.03 % nasal spray Place 1 spray into both nostrils 2 (two) times daily.     latanoprost  (XALATAN ) 0.005 % ophthalmic solution Place 1 drop into both eyes at bedtime.      levofloxacin (LEVAQUIN) 500 MG tablet Take 500 mg by mouth daily.     Maltodextrin-Xanthan Gum (RESOURCE THICKENUP CLEAR) POWD Take by mouth as needed. (Patient not taking: Reported on 08/11/2023)     metoprolol  succinate (TOPROL -XL) 25 MG 24 hr tablet Take 0.5 tablets (12.5 mg total) by mouth every morning. (Patient taking differently: Take 25 mg by mouth every morning.) 60 tablet 0   nitroGLYCERIN  (NITROSTAT ) 0.4 MG SL tablet Place 0.4 mg under the tongue every 5 (five) minutes as needed for chest pain. For chest pain      OXYGEN Inhale 2 L into the lungs at bedtime.      tamsulosin  (FLOMAX ) 0.4 MG CAPS capsule Take 0.4 mg by mouth every evening.      timolol  (TIMOPTIC ) 0.5 % ophthalmic solution Place 1 drop into both eyes 2 (two) times daily.      No current facility-administered medications for this encounter.    Physical Findings: The patient is in no acute distress. Patient is alert and oriented.  vitals were not taken for this visit. .  No significant changes. Lungs are clear to auscultation bilaterally. Heart has regular rate and rhythm. No palpable cervical, supraclavicular, or axillary adenopathy. Abdomen soft, non-tender, normal bowel sounds.   Lab Findings:  Lab Results  Component Value Date   WBC 17.7 (H) 01/21/2021   HGB 13.5 01/21/2021   HCT 40.3 01/21/2021   MCV 96.0 01/21/2021   PLT 121 (L) 01/21/2021    Radiographic Findings: CT CHEST WO CONTRAST Result Date: 11/11/2023 CLINICAL DATA:   Non-small-cell lung cancer restaging * Tracking Code: BO * EXAM: CT CHEST WITHOUT CONTRAST TECHNIQUE: Multidetector CT imaging of the chest was performed following the standard protocol without IV contrast. RADIATION DOSE REDUCTION: This exam was performed according to the departmental dose-optimization program which includes automated exposure control, adjustment of the mA and/or kV according to patient size and/or use of iterative reconstruction technique. COMPARISON:  08/05/2023 FINDINGS: Cardiovascular: Aortic atherosclerosis. Normal heart size. Extensive three-vessel coronary artery calcifications enlargement of the main pulmonary artery measuring up to 3.6 cm in caliber. No pericardial effusion. Mediastinum/Nodes: No enlarged mediastinal, hilar, or axillary lymph nodes. Thyroid  gland, trachea, and esophagus demonstrate no significant findings. Lungs/Pleura: Unchanged post treatment/post radiation appearance of the chest, with dense consolidation of the anterior paramedian left upper lobe with internal marking clips (series 5, image 33). Background of moderate emphysema. Bland appearing bibasilar scarring. Unchanged subpleural nodule of the medial right lung base measuring 1.6 x 0.8 cm (series 5, image 120). No pleural effusion or pneumothorax. Upper Abdomen: No acute abnormality. Musculoskeletal: No chest wall abnormality. Acute appearing, nondisplaced and minimally displaced fractures of the lateral right sixth through ninth ribs (series 5, image 105). Chronic, callused fracture of the manubrium as seen acutely on prior examination (series 7, image 102). Multiple unchanged thoracic wedge deformities, including of T1 T4 and T10 (series 7, image 43). IMPRESSION: 1. Unchanged post treatment/post radiation appearance of the chest, with dense consolidation of the anterior paramedian left upper lobe with internal marking clips. 2. Unchanged subpleural nodule of the medial right lung base measuring 1.6 x 0.8 cm.  Attention on follow-up. 3. Emphysema. 4. New, acute appearing, nondisplaced and minimally displaced fractures of the lateral right sixth through ninth ribs. 5. Chronic, callused fracture of the manubrium as seen acutely on prior examination. Multiple unchanged thoracic wedge deformities. 6. Coronary artery disease. Aortic Atherosclerosis (ICD10-I70.0) and Emphysema (ICD10-J43.9). Electronically Signed   By: Marolyn JONETTA Jaksch M.D.   On: 11/11/2023 12:33    Impression:  Recurrent non-small cell lung cancer; s/p IMRT completed on 03/28/2019  The patient is recovering from the effects of radiation.  ***  Plan:  ***   *** minutes of total time was spent for this patient encounter, including preparation, face-to-face counseling with the patient and coordination of care, physical exam, and documentation of the encounter. ____________________________________  Lynwood CHARM Nasuti, PhD, MD  This document serves as a record of services personally performed by Lynwood Nasuti, MD. It was created on his behalf by Reymundo Cartwright, a trained medical scribe. The creation of this record is based on the scribe's personal observations and the provider's statements to them. This document has been checked and approved by the attending provider.

## 2023-11-17 ENCOUNTER — Ambulatory Visit
Admission: RE | Admit: 2023-11-17 | Discharge: 2023-11-17 | Disposition: A | Discharge status: Home / Self Care | Payer: Self-pay | Source: Ambulatory Visit | Attending: Radiation Oncology | Admitting: Radiation Oncology

## 2023-11-17 ENCOUNTER — Encounter: Payer: Self-pay | Admitting: Radiation Oncology

## 2023-11-17 VITALS — BP 133/72 | HR 65 | Temp 97.9°F | Resp 24 | Ht 71.0 in | Wt 163.2 lb

## 2023-11-17 DIAGNOSIS — J439 Emphysema, unspecified: Secondary | ICD-10-CM | POA: Insufficient documentation

## 2023-11-17 DIAGNOSIS — R911 Solitary pulmonary nodule: Secondary | ICD-10-CM | POA: Diagnosis not present

## 2023-11-17 DIAGNOSIS — E782 Mixed hyperlipidemia: Secondary | ICD-10-CM | POA: Diagnosis not present

## 2023-11-17 DIAGNOSIS — N182 Chronic kidney disease, stage 2 (mild): Secondary | ICD-10-CM | POA: Diagnosis not present

## 2023-11-17 DIAGNOSIS — S22000D Wedge compression fracture of unspecified thoracic vertebra, subsequent encounter for fracture with routine healing: Secondary | ICD-10-CM | POA: Diagnosis not present

## 2023-11-17 DIAGNOSIS — C3492 Malignant neoplasm of unspecified part of left bronchus or lung: Secondary | ICD-10-CM

## 2023-11-17 DIAGNOSIS — J449 Chronic obstructive pulmonary disease, unspecified: Secondary | ICD-10-CM | POA: Diagnosis not present

## 2023-11-17 DIAGNOSIS — S2249XA Multiple fractures of ribs, unspecified side, initial encounter for closed fracture: Secondary | ICD-10-CM | POA: Insufficient documentation

## 2023-11-17 DIAGNOSIS — I5042 Chronic combined systolic (congestive) and diastolic (congestive) heart failure: Secondary | ICD-10-CM | POA: Diagnosis not present

## 2023-11-17 DIAGNOSIS — Z85118 Personal history of other malignant neoplasm of bronchus and lung: Secondary | ICD-10-CM | POA: Insufficient documentation

## 2023-11-17 DIAGNOSIS — Z9181 History of falling: Secondary | ICD-10-CM | POA: Diagnosis not present

## 2023-11-17 DIAGNOSIS — J9611 Chronic respiratory failure with hypoxia: Secondary | ICD-10-CM | POA: Diagnosis not present

## 2023-11-17 DIAGNOSIS — I10 Essential (primary) hypertension: Secondary | ICD-10-CM | POA: Diagnosis not present

## 2023-11-17 DIAGNOSIS — S2241XD Multiple fractures of ribs, right side, subsequent encounter for fracture with routine healing: Secondary | ICD-10-CM | POA: Diagnosis not present

## 2023-11-17 DIAGNOSIS — R0602 Shortness of breath: Secondary | ICD-10-CM | POA: Insufficient documentation

## 2023-11-17 DIAGNOSIS — Z87891 Personal history of nicotine dependence: Secondary | ICD-10-CM | POA: Diagnosis not present

## 2023-11-17 DIAGNOSIS — Z9981 Dependence on supplemental oxygen: Secondary | ICD-10-CM | POA: Diagnosis not present

## 2023-11-17 DIAGNOSIS — I700 Atherosclerosis of aorta: Secondary | ICD-10-CM | POA: Diagnosis not present

## 2023-11-17 DIAGNOSIS — Z79899 Other long term (current) drug therapy: Secondary | ICD-10-CM | POA: Diagnosis not present

## 2023-11-17 DIAGNOSIS — I2510 Atherosclerotic heart disease of native coronary artery without angina pectoris: Secondary | ICD-10-CM | POA: Diagnosis not present

## 2023-11-17 DIAGNOSIS — C3412 Malignant neoplasm of upper lobe, left bronchus or lung: Secondary | ICD-10-CM | POA: Diagnosis not present

## 2023-11-17 DIAGNOSIS — I739 Peripheral vascular disease, unspecified: Secondary | ICD-10-CM | POA: Diagnosis not present

## 2023-11-17 DIAGNOSIS — R7303 Prediabetes: Secondary | ICD-10-CM | POA: Diagnosis not present

## 2023-11-17 DIAGNOSIS — Z923 Personal history of irradiation: Secondary | ICD-10-CM | POA: Insufficient documentation

## 2023-11-17 DIAGNOSIS — M810 Age-related osteoporosis without current pathological fracture: Secondary | ICD-10-CM | POA: Diagnosis not present

## 2023-11-17 NOTE — Progress Notes (Signed)
 [name] is here today for follow up post radiation to the lung.  Lung Side: left, patient completed treatment on 04/14/19  Does the patient complain of any of the following: Pain: Yes reports back pain and right rib pain.   Shortness of breath w/wo exertion: Yes, continues to wearr oxygen at 2 L  Cough: No Hemoptysis: No Pain with swallowing: No Swallowing/choking concerns: No Appetite: Good  Energy Level: Low Post radiation skin Changes: No    Additional comments if applicable:  BP 133/72 (BP Location: Right Arm, Patient Position: Sitting, Cuff Size: Normal)   Pulse 65   Temp 97.9 F (36.6 C)   Resp (!) 24   Ht 5' 11 (1.803 m)   Wt 163 lb 3.2 oz (74 kg)   SpO2 97%   PF (!) 2 L/min   BMI 22.76 kg/m

## 2023-12-05 DIAGNOSIS — H401132 Primary open-angle glaucoma, bilateral, moderate stage: Secondary | ICD-10-CM | POA: Diagnosis not present

## 2023-12-19 DIAGNOSIS — L820 Inflamed seborrheic keratosis: Secondary | ICD-10-CM | POA: Diagnosis not present

## 2023-12-19 DIAGNOSIS — D2272 Melanocytic nevi of left lower limb, including hip: Secondary | ICD-10-CM | POA: Diagnosis not present

## 2023-12-19 DIAGNOSIS — L570 Actinic keratosis: Secondary | ICD-10-CM | POA: Diagnosis not present

## 2023-12-19 DIAGNOSIS — D225 Melanocytic nevi of trunk: Secondary | ICD-10-CM | POA: Diagnosis not present

## 2023-12-19 DIAGNOSIS — L821 Other seborrheic keratosis: Secondary | ICD-10-CM | POA: Diagnosis not present

## 2023-12-19 DIAGNOSIS — D2261 Melanocytic nevi of right upper limb, including shoulder: Secondary | ICD-10-CM | POA: Diagnosis not present

## 2023-12-19 DIAGNOSIS — D2271 Melanocytic nevi of right lower limb, including hip: Secondary | ICD-10-CM | POA: Diagnosis not present

## 2023-12-19 DIAGNOSIS — D2262 Melanocytic nevi of left upper limb, including shoulder: Secondary | ICD-10-CM | POA: Diagnosis not present

## 2023-12-19 DIAGNOSIS — L538 Other specified erythematous conditions: Secondary | ICD-10-CM | POA: Diagnosis not present

## 2023-12-28 DIAGNOSIS — I10 Essential (primary) hypertension: Secondary | ICD-10-CM | POA: Diagnosis not present

## 2023-12-28 DIAGNOSIS — I5042 Chronic combined systolic (congestive) and diastolic (congestive) heart failure: Secondary | ICD-10-CM | POA: Diagnosis not present

## 2023-12-28 DIAGNOSIS — E782 Mixed hyperlipidemia: Secondary | ICD-10-CM | POA: Diagnosis not present

## 2023-12-28 DIAGNOSIS — I2510 Atherosclerotic heart disease of native coronary artery without angina pectoris: Secondary | ICD-10-CM | POA: Diagnosis not present

## 2024-01-09 NOTE — Addendum Note (Signed)
 Encounter addended by: Irven Gauze E, [place] on: 01/09/2024 9:26 PM  Actions taken: Imaging Exam ended

## 2024-01-27 ENCOUNTER — Telehealth: Payer: Self-pay | Admitting: *Deleted

## 2024-01-27 NOTE — Telephone Encounter (Signed)
 CALLED PATIENT TO INFORM OF CT FOR 02-16-24- ARRIVAL TIME- 11 AM @ [facility], NO RESTRICTIONS TO SCAN, PATIENT TO RECEIVE RESULTS FROM DR. KINARD ON 02/20/24 @ 3 PM, LVM FOR A RETURN CALL

## 2024-02-02 ENCOUNTER — Telehealth: Payer: Self-pay | Admitting: Radiation Oncology

## 2024-02-02 NOTE — Telephone Encounter (Signed)
 11/20 patient called to r/s his follow up to 12/9 due to other appts.

## 2024-02-15 NOTE — Progress Notes (Incomplete)
 [name] is here today for follow up post radiation to the lung.  Lung Side:  Left, patient completed treatment on 03/28/19  Does the patient complain of any of the following: Pain:  0/10 Shortness of breath w/wo exertion: No Cough: No only phelm Hemoptysis: No Pain with swallowing: No Swallowing/choking concerns: No Appetite: Good Energy Level: Fair, not able to do normal activities related to SOB.  Using 2 liters O2  Post radiation skin Changes:  No    Additional comments if applicable:

## 2024-02-16 ENCOUNTER — Encounter (HOSPITAL_COMMUNITY): Payer: Self-pay

## 2024-02-16 ENCOUNTER — Ambulatory Visit (HOSPITAL_COMMUNITY)
Admission: RE | Admit: 2024-02-16 | Discharge: 2024-02-16 | Disposition: A | Source: Ambulatory Visit | Attending: Radiology

## 2024-02-16 DIAGNOSIS — C3492 Malignant neoplasm of unspecified part of left bronchus or lung: Secondary | ICD-10-CM | POA: Insufficient documentation

## 2024-02-19 NOTE — Progress Notes (Signed)
 Radiation Oncology         [phone] ________________________________  Name: [name] MRN: [id number]  Date: 02/21/2024  DOB: 1935-05-02  Follow-Up Visit Note  CC: Ransom Other, MD  Ransom Other, MD  No diagnosis found.  Diagnosis: Recurrent non-small cell lung cancer; s/p IMRT completed on 03/28/2019   Interval Since Last Radiation: 4 years, 10 months, and 27 days    Radiation Treatment Dates: 02/13/2019 through 03/28/2019 Site Technique Total Dose (Gy) Dose per Fx (Gy) Completed Fx Beam Energies  Lung, Left: Lung_Lt IMRT 60/60 2 30/30 6X      Narrative:  The patient returns today for routine follow-up and to review most recent imaging. He was last seen in office on 11/17/23 for a routine follow up. Patient continued to follow up with their specialists to manage their chronic conditions.   He followed up with his cardiologist on 12/28/23 during which he denied any new complains and was advised to continue his current regiment.   Most recent CT chest preformed on 02/16/24 did not indicate any new or progressive findings to suggest recurrent or metastatic disease along with stable appearance of the dense consolidative opacity in the anteromedial left upper lobe with associated fiducial markers. Scan did however indicate a new cluster of tiny pulmonary nodules peripherally in the right lower lobe, likely infectious/inflammatory with attention on follow-up recommended.     No other significant oncologic interval history since the patient was last seen.                               Allergies:  is allergic to entresto  [sacubitril -valsartan ], iodinated contrast media, penicillins, sulfonamide derivatives, lumigan  [bimatoprost ], lyrica [pregabalin], symbicort [budesonide-formoterol fumarate], valsartan , advair  diskus [fluticasone -salmeterol], azithromycin , brimonidine tartrate, ciprofloxacin, doxycycline, flovent diskus [fluticasone  furoate], pulmicort [budesonide], qvar  [beclomethasone], spiriva [tiotropium bromide], tessalon [benzonatate], bacitracin, flonase [fluticasone  propionate], relafen [nabumetone], and tape.  Meds: Current Outpatient Medications  Medication Sig Dispense Refill   albuterol  (VENTOLIN  HFA) 108 (90 Base) MCG/ACT inhaler Inhale 2 puffs into the lungs every 6 (six) hours as needed for wheezing or shortness of breath.      gabapentin  (NEURONTIN ) 300 MG capsule Take 600 mg by mouth 3 (three) times daily.      ipratropium (ATROVENT) 0.03 % nasal spray Place 1 spray into both nostrils 2 (two) times daily.     latanoprost  (XALATAN ) 0.005 % ophthalmic solution Place 1 drop into both eyes at bedtime.      levofloxacin (LEVAQUIN) 500 MG tablet Take 500 mg by mouth daily.     Maltodextrin-Xanthan Gum (RESOURCE THICKENUP CLEAR) POWD Take by mouth as needed. (Patient not taking: Reported on 08/11/2023)     metoprolol  succinate (TOPROL -XL) 25 MG 24 hr tablet Take 0.5 tablets (12.5 mg total) by mouth every morning. (Patient taking differently: Take 25 mg by mouth every morning.) 60 tablet 0   nitroGLYCERIN  (NITROSTAT ) 0.4 MG SL tablet Place 0.4 mg under the tongue every 5 (five) minutes as needed for chest pain. For chest pain      OXYGEN Inhale 2 L into the lungs at bedtime.      tamsulosin  (FLOMAX ) 0.4 MG CAPS capsule Take 0.4 mg by mouth every evening.      timolol  (TIMOPTIC ) 0.5 % ophthalmic solution Place 1 drop into both eyes 2 (two) times daily.      No current facility-administered medications for this visit.    Physical Findings: The patient is  in no acute distress. Patient is alert and oriented.  vitals were not taken for this visit. .  No significant changes. Lungs are clear to auscultation bilaterally. Heart has regular rate and rhythm. No palpable cervical, supraclavicular, or axillary adenopathy. Abdomen soft, non-tender, normal bowel sounds.   Lab Findings: Lab Results  Component Value Date   WBC 17.7 (H) 01/21/2021   HGB 13.5  01/21/2021   HCT 40.3 01/21/2021   MCV 96.0 01/21/2021   PLT 121 (L) 01/21/2021    Radiographic Findings: CT CHEST WO CONTRAST Result Date: 02/17/2024 CLINICAL DATA:  Non-small-cell lung cancer. Restaging. * Tracking Code: BO * EXAM: CT CHEST WITHOUT CONTRAST TECHNIQUE: Multidetector CT imaging of the chest was performed following the standard protocol without IV contrast. RADIATION DOSE REDUCTION: This exam was performed according to the departmental dose-optimization program which includes automated exposure control, adjustment of the mA and/or kV according to patient size and/or use of iterative reconstruction technique. COMPARISON:  11/11/2023 FINDINGS: Cardiovascular: The heart size is normal. No substantial pericardial effusion. Coronary artery calcification is evident. Moderate atherosclerotic calcification is noted in the wall of the thoracic aorta. Enlargement of the pulmonary outflow tract/main pulmonary arteries suggests pulmonary arterial hypertension. Mediastinum/Nodes: Upper normal mediastinal lymph nodes are stable. No evidence for gross hilar lymphadenopathy although assessment is limited by the lack of intravenous contrast on the current study. The esophagus has normal imaging features. There is no axillary lymphadenopathy. Lungs/Pleura: No substantial interval change in the dense consolidative opacity seen previously in the anteromedial left upper lobe with associated fiducial markers. Imaging findings remain compatible with post treatment scarring. Scattered tiny bilateral pulmonary nodules are similar to prior including the 16 x 9 subpleural paraspinal nodule in the right lower lobe (116/8). Scattered areas of atelectasis or scarring again noted bilaterally including lingula and right middle lobe. A cluster of tiny pulmonary nodules peripherally in the right lower lobe (96/8) is new in the interval, likely infectious/inflammatory. Otherwise, new suspicious pulmonary nodule or mass. Upper  Abdomen: Cystic lesion upper pole left kidney incompletely visualized but similar in the interval. Cystic right renal lesion seen previously not well visualized on this exam. Musculoskeletal: No worrisome lytic or sclerotic osseous abnormality. Advanced degenerative changes are seen in the sternoclavicular joints bilaterally with similar appearance of the nonacute fracture of the manubrium. Anterior wedge compression deformity noted in multiple thoracic vertebral bodies, as before. Multiple nonacute rib fractures evident. IMPRESSION: 1. Stable exam. No new or progressive findings to suggest recurrent or metastatic disease. 2. Stable appearance of the dense consolidative opacity in the anteromedial left upper lobe with associated fiducial markers. Imaging features remain compatible with post treatment scarring. 3. New cluster of tiny pulmonary nodules peripherally in the right lower lobe, likely infectious/inflammatory. Attention on follow-up recommended. 4. Additional stable scattered tiny bilateral pulmonary nodules are stable. 5. Enlargement of the pulmonary outflow tract/main pulmonary arteries suggests pulmonary arterial hypertension. 6.  Aortic Atherosclerosis (ICD10-I70.0). Electronically Signed   By: Camellia Candle M.D.   On: 02/17/2024 10:05    Impression:  Recurrent non-small cell lung cancer; s/p IMRT completed on 03/28/2019  The patient is recovering from the effects of radiation.  ***  Plan:  ***   *** minutes of total time was spent for this patient encounter, including preparation, face-to-face counseling with the patient and coordination of care, physical exam, and documentation of the encounter. ____________________________________  Lynwood CHARM Nasuti, PhD, MD  This document serves as a record of services personally performed by Lynwood Nasuti, MD.  It was created on his behalf by Reymundo Cartwright, a trained medical scribe. The creation of this record is based on the scribe's personal observations  and the provider's statements to them. This document has been checked and approved by the attending provider.

## 2024-02-20 ENCOUNTER — Ambulatory Visit: Payer: Self-pay | Admitting: Radiation Oncology

## 2024-02-21 ENCOUNTER — Ambulatory Visit
Admission: RE | Admit: 2024-02-21 | Discharge: 2024-02-21 | Attending: Radiation Oncology | Admitting: Radiation Oncology

## 2024-02-21 ENCOUNTER — Encounter: Payer: Self-pay | Admitting: Radiation Oncology

## 2024-02-21 VITALS — BP 120/84 | HR 66 | Temp 97.8°F | Resp 20 | Ht 71.0 in | Wt 166.4 lb

## 2024-02-21 DIAGNOSIS — C3492 Malignant neoplasm of unspecified part of left bronchus or lung: Secondary | ICD-10-CM

## 2024-08-21 ENCOUNTER — Other Ambulatory Visit (HOSPITAL_COMMUNITY)

## 2024-08-27 ENCOUNTER — Ambulatory Visit: Admitting: Radiation Oncology
